# Patient Record
Sex: Female | Born: 1945 | Race: White | Hispanic: No | Marital: Married | Smoking: Former smoker
Health system: Southern US, Community
[De-identification: ages and names within clinical notes are randomized; demographics above are authoritative.]

## PROBLEM LIST (undated history)

## (undated) DIAGNOSIS — M76899 Other specified enthesopathies of unspecified lower limb, excluding foot: Secondary | ICD-10-CM

## (undated) DIAGNOSIS — E782 Mixed hyperlipidemia: Secondary | ICD-10-CM

## (undated) DIAGNOSIS — I10 Essential (primary) hypertension: Secondary | ICD-10-CM

## (undated) DIAGNOSIS — I059 Rheumatic mitral valve disease, unspecified: Secondary | ICD-10-CM

## (undated) DIAGNOSIS — M1990 Unspecified osteoarthritis, unspecified site: Secondary | ICD-10-CM

## (undated) DIAGNOSIS — M1610 Unilateral primary osteoarthritis, unspecified hip: Secondary | ICD-10-CM

## (undated) DIAGNOSIS — I4891 Unspecified atrial fibrillation: Secondary | ICD-10-CM

## (undated) HISTORY — DX: Unilateral primary osteoarthritis, unspecified hip: M16.10

## (undated) HISTORY — PX: DILATION AND CURETTAGE OF UTERUS: SHX78

## (undated) HISTORY — DX: Essential (primary) hypertension: I10

## (undated) HISTORY — DX: Mixed hyperlipidemia: E78.2

## (undated) HISTORY — DX: Unspecified atrial fibrillation: I48.91

## (undated) HISTORY — DX: Unspecified osteoarthritis, unspecified site: M19.90

## (undated) HISTORY — DX: Other specified enthesopathies of unspecified lower limb, excluding foot: M76.899

## (undated) HISTORY — PX: TONSILLECTOMY: SUR1361

## (undated) HISTORY — DX: Rheumatic mitral valve disease, unspecified: I05.9

---

## 1997-11-27 ENCOUNTER — Encounter: Admission: RE | Admit: 1997-11-27 | Discharge: 1997-11-27 | Payer: Self-pay | Admitting: Sports Medicine

## 1999-01-18 ENCOUNTER — Other Ambulatory Visit: Admission: RE | Admit: 1999-01-18 | Discharge: 1999-01-18 | Payer: Self-pay | Admitting: Obstetrics & Gynecology

## 2000-02-27 ENCOUNTER — Other Ambulatory Visit: Admission: RE | Admit: 2000-02-27 | Discharge: 2000-02-27 | Payer: Self-pay | Admitting: Obstetrics & Gynecology

## 2000-03-01 ENCOUNTER — Encounter: Payer: Self-pay | Admitting: Obstetrics & Gynecology

## 2000-03-01 ENCOUNTER — Encounter: Admission: RE | Admit: 2000-03-01 | Discharge: 2000-03-01 | Payer: Self-pay | Admitting: Obstetrics & Gynecology

## 2000-11-26 ENCOUNTER — Ambulatory Visit (HOSPITAL_BASED_OUTPATIENT_CLINIC_OR_DEPARTMENT_OTHER): Admission: RE | Admit: 2000-11-26 | Discharge: 2000-11-26 | Payer: Self-pay | Admitting: Plastic Surgery

## 2000-11-26 ENCOUNTER — Encounter (INDEPENDENT_AMBULATORY_CARE_PROVIDER_SITE_OTHER): Payer: Self-pay | Admitting: Specialist

## 2001-03-05 ENCOUNTER — Encounter: Payer: Self-pay | Admitting: Obstetrics & Gynecology

## 2001-03-05 ENCOUNTER — Encounter: Admission: RE | Admit: 2001-03-05 | Discharge: 2001-03-05 | Payer: Self-pay | Admitting: Obstetrics & Gynecology

## 2001-04-01 ENCOUNTER — Other Ambulatory Visit: Admission: RE | Admit: 2001-04-01 | Discharge: 2001-04-01 | Payer: Self-pay | Admitting: Obstetrics & Gynecology

## 2001-12-04 ENCOUNTER — Ambulatory Visit (HOSPITAL_COMMUNITY): Admission: RE | Admit: 2001-12-04 | Discharge: 2001-12-04 | Payer: Self-pay | Admitting: *Deleted

## 2002-03-18 ENCOUNTER — Encounter: Payer: Self-pay | Admitting: Obstetrics & Gynecology

## 2002-03-18 ENCOUNTER — Ambulatory Visit (HOSPITAL_COMMUNITY): Admission: RE | Admit: 2002-03-18 | Discharge: 2002-03-18 | Payer: Self-pay | Admitting: Obstetrics & Gynecology

## 2002-05-19 ENCOUNTER — Other Ambulatory Visit: Admission: RE | Admit: 2002-05-19 | Discharge: 2002-05-19 | Payer: Self-pay | Admitting: Obstetrics & Gynecology

## 2002-06-06 ENCOUNTER — Encounter: Admission: RE | Admit: 2002-06-06 | Discharge: 2002-06-06 | Payer: Self-pay | Admitting: Sports Medicine

## 2002-06-12 ENCOUNTER — Encounter: Admission: RE | Admit: 2002-06-12 | Discharge: 2002-06-12 | Payer: Self-pay | Admitting: Sports Medicine

## 2002-06-12 ENCOUNTER — Encounter: Payer: Self-pay | Admitting: Sports Medicine

## 2002-06-24 ENCOUNTER — Encounter: Admission: RE | Admit: 2002-06-24 | Discharge: 2002-06-24 | Payer: Self-pay | Admitting: Family Medicine

## 2002-07-22 ENCOUNTER — Encounter: Admission: RE | Admit: 2002-07-22 | Discharge: 2002-07-22 | Payer: Self-pay | Admitting: Sports Medicine

## 2003-05-13 ENCOUNTER — Ambulatory Visit (HOSPITAL_COMMUNITY): Admission: RE | Admit: 2003-05-13 | Discharge: 2003-05-13 | Payer: Self-pay | Admitting: Obstetrics & Gynecology

## 2003-05-13 ENCOUNTER — Encounter: Payer: Self-pay | Admitting: Obstetrics & Gynecology

## 2003-06-26 ENCOUNTER — Other Ambulatory Visit: Admission: RE | Admit: 2003-06-26 | Discharge: 2003-06-26 | Payer: Self-pay | Admitting: Obstetrics & Gynecology

## 2003-12-23 ENCOUNTER — Emergency Department (HOSPITAL_COMMUNITY): Admission: EM | Admit: 2003-12-23 | Discharge: 2003-12-23 | Payer: Self-pay | Admitting: *Deleted

## 2004-02-04 ENCOUNTER — Encounter: Admission: RE | Admit: 2004-02-04 | Discharge: 2004-05-04 | Payer: Self-pay | Admitting: Family Medicine

## 2004-05-13 ENCOUNTER — Ambulatory Visit (HOSPITAL_COMMUNITY): Admission: RE | Admit: 2004-05-13 | Discharge: 2004-05-13 | Payer: Self-pay | Admitting: Obstetrics & Gynecology

## 2004-07-07 ENCOUNTER — Other Ambulatory Visit: Admission: RE | Admit: 2004-07-07 | Discharge: 2004-07-07 | Payer: Self-pay | Admitting: Obstetrics & Gynecology

## 2005-05-16 ENCOUNTER — Ambulatory Visit (HOSPITAL_COMMUNITY): Admission: RE | Admit: 2005-05-16 | Discharge: 2005-05-16 | Payer: Self-pay | Admitting: Obstetrics & Gynecology

## 2005-08-29 ENCOUNTER — Other Ambulatory Visit: Admission: RE | Admit: 2005-08-29 | Discharge: 2005-08-29 | Payer: Self-pay | Admitting: Obstetrics & Gynecology

## 2006-05-22 ENCOUNTER — Ambulatory Visit (HOSPITAL_COMMUNITY): Admission: RE | Admit: 2006-05-22 | Discharge: 2006-05-22 | Payer: Self-pay | Admitting: Obstetrics & Gynecology

## 2007-02-12 ENCOUNTER — Ambulatory Visit: Payer: Self-pay | Admitting: Sports Medicine

## 2007-02-12 DIAGNOSIS — M533 Sacrococcygeal disorders, not elsewhere classified: Secondary | ICD-10-CM

## 2007-02-12 DIAGNOSIS — M7062 Trochanteric bursitis, left hip: Secondary | ICD-10-CM

## 2007-03-26 ENCOUNTER — Ambulatory Visit: Payer: Self-pay | Admitting: Sports Medicine

## 2007-06-27 ENCOUNTER — Ambulatory Visit (HOSPITAL_COMMUNITY): Admission: RE | Admit: 2007-06-27 | Discharge: 2007-06-27 | Payer: Self-pay | Admitting: Obstetrics & Gynecology

## 2007-11-19 ENCOUNTER — Encounter: Admission: RE | Admit: 2007-11-19 | Discharge: 2007-11-19 | Payer: Self-pay | Admitting: Family Medicine

## 2008-05-11 ENCOUNTER — Encounter: Payer: Self-pay | Admitting: Internal Medicine

## 2008-05-11 ENCOUNTER — Ambulatory Visit: Payer: Self-pay

## 2008-05-11 ENCOUNTER — Encounter (INDEPENDENT_AMBULATORY_CARE_PROVIDER_SITE_OTHER): Payer: Self-pay | Admitting: Family Medicine

## 2008-05-16 ENCOUNTER — Ambulatory Visit: Payer: Self-pay | Admitting: Internal Medicine

## 2008-05-16 ENCOUNTER — Inpatient Hospital Stay (HOSPITAL_COMMUNITY): Admission: EM | Admit: 2008-05-16 | Discharge: 2008-05-18 | Payer: Self-pay | Admitting: Emergency Medicine

## 2008-05-18 ENCOUNTER — Encounter: Payer: Self-pay | Admitting: Cardiology

## 2008-05-21 ENCOUNTER — Ambulatory Visit: Payer: Self-pay | Admitting: Internal Medicine

## 2008-05-21 LAB — CONVERTED CEMR LAB
INR: 2.6 — ABNORMAL HIGH (ref 0.8–1.0)
Prothrombin Time: 28.1 s — ABNORMAL HIGH (ref 10.9–13.3)

## 2008-05-25 ENCOUNTER — Ambulatory Visit: Payer: Self-pay | Admitting: Internal Medicine

## 2008-06-01 ENCOUNTER — Ambulatory Visit: Payer: Self-pay | Admitting: Internal Medicine

## 2008-06-08 ENCOUNTER — Ambulatory Visit: Payer: Self-pay | Admitting: Internal Medicine

## 2008-06-15 ENCOUNTER — Ambulatory Visit: Payer: Self-pay | Admitting: Cardiology

## 2008-06-29 ENCOUNTER — Ambulatory Visit (HOSPITAL_COMMUNITY): Admission: RE | Admit: 2008-06-29 | Discharge: 2008-06-29 | Payer: Self-pay | Admitting: Obstetrics & Gynecology

## 2008-06-30 ENCOUNTER — Encounter: Payer: Self-pay | Admitting: Internal Medicine

## 2008-06-30 ENCOUNTER — Ambulatory Visit: Payer: Self-pay | Admitting: Cardiology

## 2008-06-30 LAB — CONVERTED CEMR LAB
Calcium: 9 mg/dL (ref 8.4–10.5)
GFR calc Af Amer: 109 mL/min
GFR calc non Af Amer: 90 mL/min
Potassium: 3.8 meq/L (ref 3.5–5.1)
Sodium: 139 meq/L (ref 135–145)

## 2008-07-13 ENCOUNTER — Encounter: Admission: RE | Admit: 2008-07-13 | Discharge: 2008-07-13 | Payer: Self-pay | Admitting: Obstetrics & Gynecology

## 2008-07-27 ENCOUNTER — Ambulatory Visit: Payer: Self-pay | Admitting: Cardiology

## 2008-08-18 ENCOUNTER — Ambulatory Visit: Payer: Self-pay | Admitting: Cardiovascular Disease

## 2008-09-03 ENCOUNTER — Ambulatory Visit: Payer: Self-pay | Admitting: Cardiology

## 2008-09-24 ENCOUNTER — Ambulatory Visit: Payer: Self-pay | Admitting: Internal Medicine

## 2008-09-24 ENCOUNTER — Ambulatory Visit: Payer: Self-pay | Admitting: Cardiovascular Disease

## 2008-09-25 ENCOUNTER — Ambulatory Visit: Payer: Self-pay | Admitting: Internal Medicine

## 2008-09-25 LAB — CONVERTED CEMR LAB
ALT: 14 units/L (ref 0–35)
Albumin: 3.6 g/dL (ref 3.5–5.2)
BUN: 16 mg/dL (ref 6–23)
Bilirubin, Direct: 0.1 mg/dL (ref 0.0–0.3)
CO2: 28 meq/L (ref 19–32)
Calcium: 8.7 mg/dL (ref 8.4–10.5)
Cholesterol: 238 mg/dL (ref 0–200)
GFR calc Af Amer: 94 mL/min
Glucose, Bld: 90 mg/dL (ref 70–99)
HDL: 45.8 mg/dL (ref >39.0)
Total Protein: 6.2 g/dL (ref 6.0–8.3)
Triglycerides: 189 mg/dL — ABNORMAL HIGH (ref 0–149)

## 2008-10-15 ENCOUNTER — Ambulatory Visit: Payer: Self-pay | Admitting: Cardiovascular Disease

## 2008-11-05 ENCOUNTER — Ambulatory Visit: Payer: Self-pay | Admitting: Cardiovascular Disease

## 2008-11-16 ENCOUNTER — Telehealth: Payer: Self-pay | Admitting: Internal Medicine

## 2008-12-04 ENCOUNTER — Ambulatory Visit: Payer: Self-pay | Admitting: Cardiology

## 2008-12-16 ENCOUNTER — Telehealth: Payer: Self-pay | Admitting: Internal Medicine

## 2008-12-31 ENCOUNTER — Ambulatory Visit: Payer: Self-pay | Admitting: Internal Medicine

## 2008-12-31 LAB — CONVERTED CEMR LAB
POC INR: 2.3
Protime: 18.5

## 2009-01-05 ENCOUNTER — Encounter: Payer: Self-pay | Admitting: *Deleted

## 2009-02-01 ENCOUNTER — Ambulatory Visit: Payer: Self-pay | Admitting: Cardiology

## 2009-02-09 ENCOUNTER — Ambulatory Visit: Payer: Self-pay | Admitting: Cardiology

## 2009-02-09 LAB — CONVERTED CEMR LAB
POC INR: 3.4
Prothrombin Time: 22.2 s

## 2009-02-10 ENCOUNTER — Encounter: Payer: Self-pay | Admitting: *Deleted

## 2009-02-17 ENCOUNTER — Telehealth: Payer: Self-pay | Admitting: Internal Medicine

## 2009-02-17 ENCOUNTER — Ambulatory Visit: Payer: Self-pay | Admitting: Internal Medicine

## 2009-02-17 ENCOUNTER — Encounter: Payer: Self-pay | Admitting: Internal Medicine

## 2009-02-17 LAB — CONVERTED CEMR LAB: POC INR: 2.8

## 2009-03-03 ENCOUNTER — Ambulatory Visit: Payer: Self-pay | Admitting: Cardiology

## 2009-03-03 LAB — CONVERTED CEMR LAB
POC INR: 2.8
Prothrombin Time: 20.2 s

## 2009-03-05 ENCOUNTER — Telehealth: Payer: Self-pay | Admitting: Internal Medicine

## 2009-03-22 ENCOUNTER — Telehealth: Payer: Self-pay | Admitting: Internal Medicine

## 2009-03-23 ENCOUNTER — Ambulatory Visit: Payer: Self-pay | Admitting: Internal Medicine

## 2009-03-24 DIAGNOSIS — I10 Essential (primary) hypertension: Secondary | ICD-10-CM | POA: Insufficient documentation

## 2009-03-24 DIAGNOSIS — Z8679 Personal history of other diseases of the circulatory system: Secondary | ICD-10-CM | POA: Insufficient documentation

## 2009-03-24 DIAGNOSIS — I4891 Unspecified atrial fibrillation: Secondary | ICD-10-CM | POA: Insufficient documentation

## 2009-03-24 DIAGNOSIS — I059 Rheumatic mitral valve disease, unspecified: Secondary | ICD-10-CM | POA: Insufficient documentation

## 2009-03-24 LAB — CONVERTED CEMR LAB
ALT: 12 units/L (ref 0–35)
AST: 16 units/L (ref 0–37)
Alkaline Phosphatase: 60 units/L (ref 39–117)
HDL: 50.1 mg/dL (ref >39.00)
Total Bilirubin: 0.6 mg/dL (ref 0.3–1.2)
Total CHOL/HDL Ratio: 3
Triglycerides: 108 mg/dL (ref 0.0–149.0)

## 2009-03-25 ENCOUNTER — Ambulatory Visit: Payer: Self-pay | Admitting: Cardiology

## 2009-03-25 ENCOUNTER — Ambulatory Visit: Payer: Self-pay | Admitting: Internal Medicine

## 2009-03-25 DIAGNOSIS — E782 Mixed hyperlipidemia: Secondary | ICD-10-CM | POA: Insufficient documentation

## 2009-03-29 ENCOUNTER — Telehealth: Payer: Self-pay | Admitting: Internal Medicine

## 2009-04-15 ENCOUNTER — Ambulatory Visit: Payer: Self-pay | Admitting: Internal Medicine

## 2009-04-15 DIAGNOSIS — M1610 Unilateral primary osteoarthritis, unspecified hip: Secondary | ICD-10-CM | POA: Insufficient documentation

## 2009-04-22 ENCOUNTER — Ambulatory Visit: Payer: Self-pay | Admitting: Cardiology

## 2009-04-29 ENCOUNTER — Ambulatory Visit: Payer: Self-pay | Admitting: Internal Medicine

## 2009-04-29 LAB — CONVERTED CEMR LAB
BUN: 15 mg/dL (ref 6–23)
CO2: 28 meq/L (ref 19–32)
Glucose, Bld: 87 mg/dL (ref 70–99)
Potassium: 3.8 meq/L (ref 3.5–5.1)
Sodium: 139 meq/L (ref 135–145)

## 2009-05-04 ENCOUNTER — Ambulatory Visit: Payer: Self-pay | Admitting: Internal Medicine

## 2009-05-05 ENCOUNTER — Encounter: Payer: Self-pay | Admitting: Internal Medicine

## 2009-05-19 ENCOUNTER — Encounter: Payer: Self-pay | Admitting: Internal Medicine

## 2009-05-20 ENCOUNTER — Ambulatory Visit: Payer: Self-pay | Admitting: Cardiovascular Disease

## 2009-06-02 ENCOUNTER — Telehealth: Payer: Self-pay | Admitting: Internal Medicine

## 2009-06-17 ENCOUNTER — Ambulatory Visit: Payer: Self-pay

## 2009-07-08 ENCOUNTER — Ambulatory Visit (HOSPITAL_COMMUNITY): Admission: RE | Admit: 2009-07-08 | Discharge: 2009-07-08 | Payer: Self-pay | Admitting: Obstetrics & Gynecology

## 2009-07-15 ENCOUNTER — Ambulatory Visit: Payer: Self-pay | Admitting: Cardiology

## 2009-07-15 LAB — CONVERTED CEMR LAB: POC INR: 2

## 2009-08-12 ENCOUNTER — Ambulatory Visit: Payer: Self-pay | Admitting: Internal Medicine

## 2009-08-12 LAB — CONVERTED CEMR LAB: POC INR: 2.7

## 2009-08-23 ENCOUNTER — Telehealth: Payer: Self-pay | Admitting: Internal Medicine

## 2009-09-09 ENCOUNTER — Ambulatory Visit: Payer: Self-pay | Admitting: Internal Medicine

## 2009-09-09 LAB — CONVERTED CEMR LAB: POC INR: 2.7

## 2009-10-07 ENCOUNTER — Ambulatory Visit: Payer: Self-pay | Admitting: Internal Medicine

## 2009-10-07 LAB — CONVERTED CEMR LAB: POC INR: 2.4

## 2009-10-22 ENCOUNTER — Telehealth: Payer: Self-pay | Admitting: Internal Medicine

## 2009-10-26 ENCOUNTER — Encounter: Payer: Self-pay | Admitting: Internal Medicine

## 2009-11-03 ENCOUNTER — Ambulatory Visit: Payer: Self-pay | Admitting: Cardiovascular Disease

## 2009-11-03 LAB — CONVERTED CEMR LAB: POC INR: 2.6

## 2009-11-18 ENCOUNTER — Ambulatory Visit: Payer: Self-pay | Admitting: Internal Medicine

## 2009-11-22 ENCOUNTER — Ambulatory Visit: Payer: Self-pay | Admitting: Internal Medicine

## 2009-11-22 LAB — CONVERTED CEMR LAB
ALT: 14 units/L (ref 0–35)
AST: 18 units/L (ref 0–37)
Albumin: 3.6 g/dL (ref 3.5–5.2)
Alkaline Phosphatase: 58 units/L (ref 39–117)
Calcium: 8.7 mg/dL (ref 8.4–10.5)
Cholesterol: 134 mg/dL (ref 0–200)
Creatinine, Ser: 0.8 mg/dL (ref 0.4–1.2)
GFR calc non Af Amer: 77.07 mL/min (ref >60)
Sodium: 141 meq/L (ref 135–145)
Total CHOL/HDL Ratio: 3
Triglycerides: 82 mg/dL (ref 0.0–149.0)

## 2009-11-23 ENCOUNTER — Telehealth: Payer: Self-pay | Admitting: Internal Medicine

## 2009-12-09 ENCOUNTER — Ambulatory Visit: Payer: Self-pay | Admitting: Cardiology

## 2009-12-30 ENCOUNTER — Encounter: Payer: Self-pay | Admitting: Internal Medicine

## 2010-01-04 ENCOUNTER — Ambulatory Visit: Payer: Self-pay | Admitting: Cardiology

## 2010-01-04 LAB — CONVERTED CEMR LAB: POC INR: 1.8

## 2010-01-18 ENCOUNTER — Telehealth: Payer: Self-pay | Admitting: Internal Medicine

## 2010-01-25 ENCOUNTER — Encounter: Payer: Self-pay | Admitting: Cardiology

## 2010-01-25 ENCOUNTER — Ambulatory Visit: Payer: Self-pay

## 2010-02-03 ENCOUNTER — Ambulatory Visit: Payer: Self-pay | Admitting: Internal Medicine

## 2010-02-04 LAB — CONVERTED CEMR LAB
Basophils Relative: 0.5 % (ref 0.0–3.0)
Eosinophils Absolute: 0.1 10*3/uL (ref 0.0–0.7)
HCT: 38.9 % (ref 36.0–46.0)
Hemoglobin: 13.4 g/dL (ref 12.0–15.0)
Lymphocytes Relative: 28.2 % (ref 12.0–46.0)
MCHC: 34.5 g/dL (ref 30.0–36.0)
Monocytes Relative: 7 % (ref 3.0–12.0)
Neutro Abs: 5.3 10*3/uL (ref 1.4–7.7)
RBC: 4.14 M/uL (ref 3.87–5.11)

## 2010-02-24 ENCOUNTER — Telehealth: Payer: Self-pay | Admitting: Internal Medicine

## 2010-06-22 ENCOUNTER — Telehealth: Payer: Self-pay | Admitting: Internal Medicine

## 2010-07-12 ENCOUNTER — Ambulatory Visit (HOSPITAL_COMMUNITY)
Admission: RE | Admit: 2010-07-12 | Discharge: 2010-07-12 | Payer: Self-pay | Source: Home / Self Care | Admitting: Obstetrics & Gynecology

## 2010-07-15 ENCOUNTER — Ambulatory Visit: Payer: Self-pay | Admitting: Internal Medicine

## 2010-07-15 ENCOUNTER — Encounter: Payer: Self-pay | Admitting: Internal Medicine

## 2010-08-12 ENCOUNTER — Encounter
Admission: RE | Admit: 2010-08-12 | Discharge: 2010-08-12 | Payer: Self-pay | Source: Home / Self Care | Attending: Orthopedic Surgery | Admitting: Orthopedic Surgery

## 2010-08-28 ENCOUNTER — Encounter: Payer: Self-pay | Admitting: Obstetrics & Gynecology

## 2010-09-06 NOTE — Medication Information (Signed)
Summary: rov-tp  Anticoagulant Therapy  Managed by: Weston Brass, PharmD Referring MD: Hillis Range PCP: Newt Lukes MD Supervising MD: Shirlee Latch MD, [place] Maulden Indication 1: Atrial Fibrillation (ICD-427.31) Lab Used: [facility] Site: [facility] INR POC 1.8 INR RANGE 2 - 3  Dietary changes: no    Health status changes: no    Bleeding/hemorrhagic complications: no    Recent/future hospitalizations: no    Any changes in medication regimen? yes       Details: on amoxicillin; has about 4 days left   Recent/future dental: no  Any missed doses?: no       Is patient compliant with meds? yes       Allergies: No Known Drug Allergies  Anticoagulation Management History:      The patient is taking warfarin and comes in today for a routine follow up visit.  Negative risk factors for bleeding include an age less than 64 years old.  The bleeding index is 'low risk'.  Positive CHADS2 values include History of HTN.  Negative CHADS2 values include Age > 64 years old.  The start date was 05/16/2008.  Her last INR was 1.7 ratio.  Anticoagulation responsible provider: Shirlee Latch MD, Takerra Lupinacci.  INR POC: 1.8.  Cuvette Lot#: [id number].  Exp: 03/2011.    Anticoagulation Management Assessment/Plan:      The target INR is 2 - 3.  The next INR is due 01/25/2010.  Anticoagulation instructions were given to patient.  Results were reviewed/authorized by Weston Brass, PharmD.  She was notified by Weston Brass PharmD.         Prior Anticoagulation Instructions: Take 2 tablets today then resume 1 tab daily except 2 tabs on MWF.   Current Anticoagulation Instructions: INR 1.8  Increase dose to 2 tablets every day except 1 tablet on Monday, Wednesday and Friday

## 2010-09-06 NOTE — Progress Notes (Signed)
Summary: refill request  Phone Note Call from Patient Call back at Home Phone [phone]   Caller: Patient Summary of Call: pt called requesting refill of [facility]ril [facility] [address] Initial call taken by: Margaret Pyle, CMA,  August 23, 2009 3:06 PM    Prescriptions: LISINOPRIL 40 MG TABS (LISINOPRIL) Take 1 tablet once a day  #30 x 2   Entered by:   Margaret Pyle, CMA   Authorized by:   Corwin Levins MD   Signed by:   Margaret Pyle, CMA on 08/23/2009   Method used:   [facility]ron[facility] to        [facility]  [phone]* (retail)       [address] [place], [place]  [address]       Ph: [phone] or [phone]       Fax: [phone]   RxID:   [id number]

## 2010-09-06 NOTE — Progress Notes (Signed)
Summary: lisinopril  Phone Note Refill Request Message from:  Fax from Pharmacy on June 22, 2010 11:39 AM  Refills Requested: Medication #1:  LISINOPRIL 40 MG TABS Take 1 tablet once a day [facility]  Initial call taken by: Orlan Leavens RMA,  June 22, 2010 11:39 AM    Prescriptions: LISINOPRIL 40 MG TABS (LISINOPRIL) Take 1 tablet once a day  #90 x 2   Entered by:   Orlan Leavens RMA   Authorized by:   Newt Lukes MD   Signed by:   Orlan Leavens RMA on 06/22/2010   Method used:   Faxed to ...       [facility] [place] (retail)             , [place]         Ph: [phone]       Fax: [phone]   RxID:   [id number]

## 2010-09-06 NOTE — Letter (Signed)
Summary: [facility]  [facility]   Imported By: Sherian Rein 02/02/2010 14:52:10  _____________________________________________________________________  External Attachment:    Type:   Image     Comment:   External Document

## 2010-09-06 NOTE — Progress Notes (Signed)
Summary: HCTZ  Phone Note Refill Request Message from:  Fax from Pharmacy on February 24, 2010 9:01 AM  Refills Requested: Medication #1:  HYDROCHLOROTHIAZIDE 25 MG TABS 1 tab by mouth once daily  or as directed [facility]  Initial call taken by: Orlan Leavens,  February 24, 2010 9:01 AM  Follow-up for Phone Call        MD sign paper request ok # [id number] with 3 refills. [facility]. will update EMR Follow-up by: Orlan Leavens,  February 24, 2010 9:02 AM    Prescriptions: HYDROCHLOROTHIAZIDE 25 MG TABS (HYDROCHLOROTHIAZIDE) 1 tab by mouth once daily  or as directed  #90 x 3   Entered by:   Orlan Leavens   Authorized by:   Newt Lukes MD   Signed by:   Orlan Leavens on 02/24/2010   Method used:   Faxed to ...       [facility] (retail)             , [place]         Ph: [phone]       Fax: [phone]   RxID:   [id number]

## 2010-09-06 NOTE — Medication Information (Signed)
Summary: [facility]  Anticoagulant Therapy  Managed by: Inactive Referring MD: Hillis Range PCP: Newt Lukes MD Supervising MD: Riley Kill MD, Maisie Fus Indication 1: Atrial Fibrillation (ICD-427.31) Lab Used: [facility] Site: [facility] INR RANGE 2 - 3          Comments: Starting Pradaxa today.   Allergies: No Known Drug Allergies  Anticoagulation Management History:      Negative risk factors for bleeding include an age less than 64 years old.  The bleeding index is 'low risk'.  Positive CHADS2 values include History of HTN.  Negative CHADS2 values include Age > 61 years old.  The start date was 05/16/2008.  Her last INR was 1.7 ratio.  Anticoagulation responsible provider: Riley Kill MD, Maisie Fus.  Exp: 04/2011.    Anticoagulation Management Assessment/Plan:      The patient's current anticoagulation dose is Warfarin sodium 2.5 mg tabs: Use as directed by Anticoagualtion Clinic.  The target INR is 2 - 3.  The next INR is due 01/25/2010.  Anticoagulation instructions were given to patient.  Results were reviewed/authorized by Inactive.         Prior Anticoagulation Instructions: INR 1.4 Discontinue coumadin and start Pradaxa 150mg s two times a day starting today.  Lab work on 02/03/10 at 1030AM.

## 2010-09-06 NOTE — Letter (Signed)
Summary: [facility]  [facility]   Imported By: Sherian Rein 11/08/2009 13:49:26  _____________________________________________________________________  External Attachment:    Type:   Image     Comment:   External Document

## 2010-09-06 NOTE — Medication Information (Signed)
Summary: rov/sp  Anticoagulant Therapy  Managed by: Bethena Midget, RN, BSN Referring MD: Hillis Range PCP: Newt Lukes MD Supervising MD: Riley Kill MD, Maisie Fus Indication 1: Atrial Fibrillation (ICD-427.31) Lab Used: [facility] Site: [facility] INR POC 1.4 INR RANGE 2 - 3  Dietary changes: no    Health status changes: no    Bleeding/hemorrhagic complications: no    Recent/future hospitalizations: no    Any changes in medication regimen? no    Recent/future dental: no  Any missed doses?: yes     Details: Held coumadin since Saturday's dose, pending Pradaxa start  Is patient compliant with meds? yes      s  Allergies: No Known Drug Allergies  Anticoagulation Management History:      The patient is taking warfarin and comes in today for a routine follow up visit.  Negative risk factors for bleeding include an age less than 89 years old.  The bleeding index is 'low risk'.  Positive CHADS2 values include History of HTN.  Negative CHADS2 values include Age > 64 years old.  The start date was 05/16/2008.  Her last INR was 1.7 ratio.  Anticoagulation responsible provider: Riley Kill MD, Maisie Fus.  INR POC: 1.4.  Cuvette Lot#: [id number].  Exp: 04/2011.    Anticoagulation Management Assessment/Plan:      The patient's current anticoagulation dose is Warfarin sodium 2.5 mg tabs: Use as directed by Anticoagualtion Clinic.  The target INR is 2 - 3.  The next INR is due 01/25/2010.  Anticoagulation instructions were given to patient.  Results were reviewed/authorized by Bethena Midget, RN, BSN.  She was notified by Bethena Midget, RN, BSN.         Prior Anticoagulation Instructions: INR 1.8  Increase dose to 2 tablets every day except 1 tablet on Monday, Wednesday and Friday   Current Anticoagulation Instructions: INR 1.4 Discontinue coumadin and start Pradaxa 150mg s two times a day starting today.  Lab work on 02/03/10 at 1030AM.

## 2010-09-06 NOTE — Assessment & Plan Note (Signed)
Summary: f44m/per check out/lg   Primary Provider:  Newt Lukes MD   History of Present Illness: The patient presents today for routine electrophysiology followup. She reports doing very well since last being seen in our clinic.  She reports mild palpitations and fatigue with dizziness lasting 1 hour earlier this week. The patient denies symptoms of chest pain, shortness of breath, orthopnea, PND, lower extremity edema,  presyncope, syncope, or neurologic sequela. The patient is tolerating medications without difficulties and is otherwise without complaint today.   Current Medications (verified): 1)  Lisinopril 40 Mg Tabs (Lisinopril) .... Take 1 Tablet Once A Day 2)  Simvastatin 20 Mg Tabs (Simvastatin) .... Take 1 Tablet By Mouth At Bedtime 3)  Cartia Xt 240 Mg Xr24h-Cap (Diltiazem Hcl Coated Beads) .... Take One Tablet Once Daily 4)  Diltiazem Hcl 30 Mg Tabs (Diltiazem Hcl) .... As Needed 5)  Hydrochlorothiazide 25 Mg Tabs (Hydrochlorothiazide) .[name].. 1 Tab By Mouth Once Daily  or As Directed 6)  Pradaxa 150 Mg Caps (Dabigatran Etexilate Mesylate) .... Two Times A Day  Allergies (verified): No Known Drug Allergies  Past History:  Past Medical History: Reviewed history from 05/04/2009 and no changes required. MITRAL VALVE PROLAPSE  HYPERTENSION ATRIAL FIBRILLATION  dyslipidemia Last DEXA scan 2008, no osteoporosis found  physician roster: Kania Regnier-cardiology. Lloyd Huger - gynecology  Past Surgical History: Reviewed history from 04/15/2009 and no changes required.  DC CARDIOVERSION  05/18/2008  Marca Ancona, MD   Excision of previous vertically-oriented biopsy site of dysplastic nevus, left lower forehead Tonsillectomy (1952)  Vital Signs:  Patient profile:   65 year old female Height:      64 inches Weight:      179 pounds BMI:     30.84 Pulse rate:   70 / minute BP sitting:   120 / 70  (left arm)  Vitals Entered By: Laurance Flatten CMA (July 15, 2010 11:30  AM)  Physical Exam  General:  Well developed, well nourished, in no acute distress. Head:  normocephalic and atraumatic Eyes:  PERRLA/EOM intact; conjunctiva and lids normal. Mouth:  Teeth, gums and palate normal. Oral mucosa normal. Neck:  supple Lungs:  Clear bilaterally to auscultation and percussion. Heart:  Non-displaced PMI, chest non-tender; regular rate and rhythm, S1, S2 without murmurs, rubs or gallops. Carotid upstroke normal, no bruit. Normal abdominal aortic size, no bruits. Femorals normal pulses, no bruits. Pedals normal pulses. No edema, no varicosities. Abdomen:  Bowel sounds positive; abdomen soft and non-tender without masses, organomegaly, or hernias noted. No hepatosplenomegaly. Msk:  Back normal, normal gait. Muscle strength and tone normal. Pulses:  pulses normal in all 4 extremities Extremities:  No clubbing or cyanosis. Neurologic:  Alert and oriented x 3.   EKG  Procedure date:  07/15/2010  Findings:      sinus 70 bpm, normal ekg  Impression & Recommendations:  Problem # 1:  ATRIAL FIBRILLATION (ICD-427.31) Appears to be doing well. She should call our office if she has sustained palpitations to have an ekg obtained.  We will continue cardizem. continue pradaxa  Problem # 2:  MIXED HYPERLIPIDEMIA (ICD-272.2) controlled Her updated medication list for this problem includes:    Simvastatin 20 Mg Tabs (Simvastatin) .[name]... Take 1 tablet by mouth at bedtime  Problem # 3:  HYPERTENSION (ICD-401.9) controlled  Patient Instructions: 1)  Your physician wants you to follow-up in: 12 months with Dr Jacquiline Doe will receive a reminder letter in the mail two months in advance. If you  don't receive a letter, please call our office to schedule the follow-up appointment. 2)  Your physician recommends that you continue on your current medications as directed. Please refer to the Current Medication list given to you today.

## 2010-09-06 NOTE — Progress Notes (Signed)
Summary: lisinopril  Phone Note Call from Patient Call back at [facility] Phone [phone]   Summary of Call: Patient left message on triage that she would like someone to call her to discuss her Lisinopril. Patient is changing to [facility] and needs a 90 day supply prescription sent to them. Initial call taken by: Lucious Groves,  October 22, 2009 12:06 PM  Follow-up for Phone Call        RX sent, pt informed Follow-up by: Margaret Pyle, CMA,  October 22, 2009 1:41 PM    Prescriptions: LISINOPRIL 40 MG TABS (LISINOPRIL) Take 1 tablet once a day  #90 x 2   Entered by:   Margaret Pyle, CMA   Authorized by:   Newt Lukes MD   Signed by:   Margaret Pyle, CMA on 10/22/2009   Method used:   Faxed to ...       [facility] ([facility]order)       [address] [place], [place]  [address]       Ph: [phone]       Fax: [phone]   RxID:   [id number]

## 2010-09-06 NOTE — Medication Information (Signed)
Summary: ccr/ gd  Anticoagulant Therapy  Managed by: Charolotte Eke, PharmD Referring MD: Hillis Range PCP: Newt Lukes MD Supervising MD: Shirlee Latch MD, Ralston Venus Indication 1: Atrial Fibrillation (ICD-427.31) Lab Used: [facility] Site: [facility] INR POC 1.7 INR RANGE 2 - 3  Dietary changes: yes       Details: more salads lately  Health status changes: no    Bleeding/hemorrhagic complications: no    Recent/future hospitalizations: no    Any changes in medication regimen? no    Recent/future dental: no  Any missed doses?: yes     Details: x 1 approx. 5 days ago  Is patient compliant with meds? yes      Comments: Has plans to start Pradaxa in about 3 months.  Current Medications (verified): 1)  Lisinopril 40 Mg Tabs (Lisinopril) .... Take 1 Tablet Once A Day 2)  Simvastatin 20 Mg Tabs (Simvastatin) .... Take 1 Tablet By Mouth At Bedtime 3)  Cartia Xt 240 Mg Xr24h-Cap (Diltiazem Hcl Coated Beads) .... Take One Tablet Once Daily 4)  Activella 0.5-0.1 Mg Tabs (Estradiol-Norethindrone Acet) .... Once Daily 5)  Diltiazem Hcl 30 Mg Tabs (Diltiazem Hcl) .... As Needed 6)  Hydrochlorothiazide 25 Mg Tabs (Hydrochlorothiazide) .[name].. 1 Tab By Mouth Once Daily  or As Directed 7)  Pradaxa 150 Mg Caps (Dabigatran Etexilate Mesylate) .... One By Mouth Two Times A Day  Allergies (verified): No Known Drug Allergies  Anticoagulation Management History:      The patient is taking warfarin and comes in today for a routine follow up visit.  Negative risk factors for bleeding include an age less than 60 years old.  The bleeding index is 'low risk'.  Positive CHADS2 values include History of HTN.  Negative CHADS2 values include Age > 52 years old.  The start date was 05/16/2008.  Her last INR was 1.7 ratio.  Anticoagulation responsible provider: Shirlee Latch MD, Gildardo Tickner.  INR POC: 1.7.  Cuvette Lot#: [id number].  Exp: 01/06/2011.    Anticoagulation Management Assessment/Plan:      The target INR is  2 - 3.  The next INR is due 12/30/2009.  Anticoagulation instructions were given to patient.  Results were reviewed/authorized by Charolotte Eke, PharmD.  She was notified by Charolotte Eke, PharmD.         Prior Anticoagulation Instructions: INR 2.6  Continue on same dosage 1 tablet daily except 2 tablets on Mondays, Wednesdays, and Fridays.  Return in 4 weeks.    Current Anticoagulation Instructions: Take 2 tablets today then resume 1 tab daily except 2 tabs on MWF.

## 2010-09-06 NOTE — Medication Information (Signed)
Summary: rov/eac  Anticoagulant Therapy  Managed by: Cloyde Reams, RN, BSN Referring MD: Hillis Range PCP: Newt Lukes MD Supervising MD: Eden Emms MD, Theron Arista Indication 1: Atrial Fibrillation (ICD-427.31) Lab Used: [facility] Site: [facility] INR POC 2.6 INR RANGE 2 - 3  Dietary changes: yes       Details: Incr amt of vegetables/salads  Health status changes: no    Bleeding/hemorrhagic complications: no    Recent/future hospitalizations: no    Any changes in medication regimen? no    Recent/future dental: no  Any missed doses?: no       Is patient compliant with meds? yes       Allergies (verified): No Known Drug Allergies  Anticoagulation Management History:      The patient is taking warfarin and comes in today for a routine follow up visit.  Negative risk factors for bleeding include an age less than 85 years old.  The bleeding index is 'low risk'.  Positive CHADS2 values include History of HTN.  Negative CHADS2 values include Age > 31 years old.  The start date was 05/16/2008.  Her last INR was 2.6 RATIO.  Anticoagulation responsible provider: Eden Emms MD, Theron Arista.  INR POC: 2.6.  Cuvette Lot#: [id number].  Exp: 12/2010.    Anticoagulation Management Assessment/Plan:      The patient's current anticoagulation dose is Coumadin 2.5 mg tabs: use as directed per anticoagulation clinic..  The target INR is 2 - 3.  The next INR is due 12/02/2009.  Anticoagulation instructions were given to patient.  Results were reviewed/authorized by Cloyde Reams, RN, BSN.  She was notified by Cloyde Reams RN.         Prior Anticoagulation Instructions: INR 2.4  Continue current dosing schedule.  Take 2 tablets on Monday, Wednesday, and Friday and take 1 tablet all other days.  Return to clinic in 4 weeks.   Current Anticoagulation Instructions: INR 2.6  Continue on same dosage 1 tablet daily except 2 tablets on Mondays, Wednesdays, and Fridays.  Return in 4 weeks.

## 2010-09-06 NOTE — Medication Information (Signed)
Summary: rov/tm  Anticoagulant Therapy  Managed by: Cloyde Reams, RN, BSN Referring MD: Hillis Range PCP: Newt Lukes MD Supervising MD: Tenny Craw MD, Gunnar Fusi Indication 1: Atrial Fibrillation (ICD-427.31) Lab Used: [facility] Site: [facility] INR POC 2.7 INR RANGE 2 - 3  Dietary changes: no    Health status changes: no    Bleeding/hemorrhagic complications: no    Recent/future hospitalizations: no    Any changes in medication regimen? no    Recent/future dental: no  Any missed doses?: yes     Details: Unsure, may have missed 1 dose several weeks ago.  Is patient compliant with meds? no       Allergies (verified): No Known Drug Allergies  Anticoagulation Management History:      The patient is taking warfarin and comes in today for a routine follow up visit.  Negative risk factors for bleeding include an age less than 22 years old.  The bleeding index is 'low risk'.  Positive CHADS2 values include History of HTN.  Negative CHADS2 values include Age > 44 years old.  The start date was 05/16/2008.  Her last INR was 2.6 RATIO.  Anticoagulation responsible provider: Tenny Craw MD, Gunnar Fusi.  INR POC: 2.7.  Cuvette Lot#: [id number].  Exp: 09/2010.    Anticoagulation Management Assessment/Plan:      The patient's current anticoagulation dose is Coumadin 2.5 mg tabs: use as directed per anticoagulation clinic..  The target INR is 2 - 3.  The next INR is due 09/09/2009.  Anticoagulation instructions were given to patient.  Results were reviewed/authorized by Cloyde Reams, RN, BSN.  She was notified by Cloyde Reams RN.         Prior Anticoagulation Instructions: INR 2.0 Today take 2 tablets then resume 1 tab everyday except 2 tabs on Mondays, Wednesdays and Fridays. Recheck in 4 weeks.    Current Anticoagulation Instructions: INR 2.7  Continue on same dosage 1 tablet daily except 2 tablets on Mondays, Wednesdays, and Fridays.   Recheck in 4 weeks.

## 2010-09-06 NOTE — Medication Information (Signed)
Summary: rov/tm  Anticoagulant Therapy  Managed by: Eda Keys, PharmD Referring MD: Hillis Range PCP: Newt Lukes MD Supervising MD: Tenny Craw MD, Gunnar Fusi Indication 1: Atrial Fibrillation (ICD-427.31) Lab Used: [facility] Site: [facility] INR POC 2.4 INR RANGE 2 - 3  Dietary changes: no    Health status changes: no    Bleeding/hemorrhagic complications: no    Recent/future hospitalizations: no    Any changes in medication regimen? no    Recent/future dental: no  Any missed doses?: no       Is patient compliant with meds? yes       Allergies: No Known Drug Allergies  Anticoagulation Management History:      The patient is taking warfarin and comes in today for a routine follow up visit.  Negative risk factors for bleeding include an age less than 79 years old.  The bleeding index is 'low risk'.  Positive CHADS2 values include History of HTN.  Negative CHADS2 values include Age > 6 years old.  The start date was 05/16/2008.  Her last INR was 2.6 RATIO.  Anticoagulation responsible provider: Tenny Craw MD, Gunnar Fusi.  INR POC: 2.4.  Cuvette Lot#: [id number].  Exp: 12/2010.    Anticoagulation Management Assessment/Plan:      The patient's current anticoagulation dose is Coumadin 2.5 mg tabs: use as directed per anticoagulation clinic..  The target INR is 2 - 3.  The next INR is due 11/04/2009.  Anticoagulation instructions were given to patient.  Results were reviewed/authorized by Eda Keys, PharmD.  She was notified by Eda Keys.         Prior Anticoagulation Instructions: INR 2.7 Continue 2.5mg s daily except 5mg s on Mondays, Wednesdays and Fridays. Recheck in 4 weeks.   Current Anticoagulation Instructions: INR 2.4  Continue current dosing schedule.  Take 2 tablets on Monday, Wednesday, and Friday and take 1 tablet all other days.  Return to clinic in 4 weeks.

## 2010-09-06 NOTE — Assessment & Plan Note (Signed)
Summary: rov per pt call/lg   Primary Provider:  Newt Lukes MD  CC:  afib.  History of Present Illness: The patient presents today for routine electrophysiology followup. She reports doing very well since last being seen in our clinic.  She reports mild palpitations and fatigue with dizziness lasting 1 hour earlier this week. The patient denies symptoms of chest pain, shortness of breath, orthopnea, PND, lower extremity edema,  presyncope, syncope, or neurologic sequela. The patient is tolerating medications without difficulties and is otherwise without complaint today.   Current Medications (verified): 1)  Lisinopril 40 Mg Tabs (Lisinopril) .... Take 1 Tablet Once A Day 2)  Simvastatin 20 Mg Tabs (Simvastatin) .... Take 1 Tablet By Mouth At Bedtime 3)  Coumadin 2.5 Mg Tabs (Warfarin Sodium) .... Use As Directed Per Anticoagulation Clinic. 4)  Cartia Xt 240 Mg Xr24h-Cap (Diltiazem Hcl Coated Beads) .... Take One Tablet Once Daily 5)  Activella 0.5-0.1 Mg Tabs (Estradiol-Norethindrone Acet) .... Once Daily 6)  Diltiazem Hcl 30 Mg Tabs (Diltiazem Hcl) .... As Needed 7)  Hydrochlorothiazide 25 Mg Tabs (Hydrochlorothiazide) .[name].. 1 Tab By Mouth Once Daily  or As Directed  Allergies (verified): No Known Drug Allergies  Past History:  Past Medical History: Reviewed history from 05/04/2009 and no changes required. MITRAL VALVE PROLAPSE  HYPERTENSION ATRIAL FIBRILLATION  dyslipidemia Last DEXA scan 2008, no osteoporosis found  physician roster: Nycholas Rayner-cardiology. Lloyd Huger - gynecology  Past Surgical History: Reviewed history from 04/15/2009 and no changes required.  DC CARDIOVERSION  05/18/2008  Marca Ancona, MD   Excision of previous vertically-oriented biopsy site of dysplastic nevus, left lower forehead Tonsillectomy (1952)  Social History: Reviewed history from 04/15/2009 and no changes required. Patient lives in [place]. Retired Lives at home with husband, has one  son in college. Married Former Smoker (x one year -remote)  Review of Systems       All systems are reviewed and negative except as listed in the HPI.   Vital Signs:  Patient profile:   65 year old female Height:      64 inches Weight:      170 pounds BMI:     29.29 Pulse rate:   74 / minute Pulse rhythm:   regular BP sitting:   119 / 64  (left arm) Cuff size:   large  Vitals Entered By: Judithe Modest CMA (November 18, 2009 4:28 PM)  Physical Exam  General:  Well developed, well nourished, in no acute distress. Head:  normocephalic and atraumatic Eyes:  PERRLA/EOM intact; conjunctiva and lids normal. Mouth:  Teeth, gums and palate normal. Oral mucosa normal. Neck:  Neck supple, no JVD. No masses, thyromegaly or abnormal cervical nodes. Lungs:  Clear bilaterally to auscultation and percussion. Heart:  Non-displaced PMI, chest non-tender; regular rate and rhythm, S1, S2 without murmurs, rubs or gallops. Carotid upstroke normal, no bruit. Normal abdominal aortic size, no bruits. Femorals normal pulses, no bruits. Pedals normal pulses. No edema, no varicosities. Abdomen:  Bowel sounds positive; abdomen soft and non-tender without masses, organomegaly, or hernias noted. No hepatosplenomegaly. Msk:  Back normal, normal gait. Muscle strength and tone normal. Pulses:  pulses normal in all 4 extremities Extremities:  No clubbing or cyanosis. Neurologic:  Alert and oriented x 3. Skin:  Intact without lesions or rashes. Psych:  Normal affect.   EKG  Procedure date:  11/18/2009  Findings:      sinus rhythm 74 bpm, PR 190, QT 400, otherwies normal ekg  Impression & Recommendations:  Problem # 1:  ATRIAL FIBRILLATION (ICD-427.31)  Appears to be doing well. She should call our office if she has sustained palpitations to have an ekg obtained.  We will continue cardizem. WE had a long discussion today regarding pradaxa as an alternative to coumadin.  She wishes to switch to pradaxa.   She will therefore stop coumadin today and have an INR on Monday.  If her INR<2, she will start pradaxa 150mg  two times a day at that time.  Problem # 2:  HYPERTENSION (ICD-401.9)  much improved we will obtain a bmet  weight loss advised  Problem # 3:  MIXED HYPERLIPIDEMIA (ICD-272.2) we will check fasting lipids and LFTs on Monday  Her updated medication list for this problem includes:    Simvastatin 20 Mg Tabs (Simvastatin) .[name]... Take 1 tablet by mouth at bedtime  Patient Instructions: 1)  Your physician recommends that you schedule a follow-up appointment in: 6 months with Dr Johney Frame 2)  Your physician recommends that you return for lab work on Monday CMET/ fasting lipid 3)  Your physician has recommended you make the following change in your medication: stop Coumadin start Pradaxa 150mg  two times a day Prescriptions: PRADAXA 150 MG CAPS (DABIGATRAN ETEXILATE MESYLATE) one by mouth two times a day  #180 x 3   Entered by:   Dennis Bast, RN, BSN   Authorized by:   Hillis Range, MD   Signed by:   Dennis Bast, RN, BSN on 11/18/2009   Method used:   Faxed to ...       [facility] ([facility]       [address] [place], [place]  [address]       Ph: [phone]       Fax: [phone]   RxID:   [id number] PRADAXA 150 MG CAPS (DABIGATRAN ETEXILATE MESYLATE) one by mouth two times a day  #180 x 3   Entered by:   Dennis Bast, RN, BSN   Authorized by:   Hillis Range, MD   Signed by:   Dennis Bast, RN, BSN on 11/18/2009   Method used:   Print then Give to Patient   RxID:   [id number] PRADAXA 150 MG CAPS (DABIGATRAN ETEXILATE MESYLATE) one by mouth two times a day  #60 x 1   Entered by:   Dennis Bast, RN, BSN   Authorized by:   Hillis Range, MD   Signed by:   Dennis Bast, RN, BSN on 11/18/2009   Method used:   Electronically to        [facility] (retail)       [address]       [place], [place]  [address]       Ph:  [phone] or [phone]       Fax: [phone]   RxID:   [id number]

## 2010-09-06 NOTE — Medication Information (Signed)
Summary: rov/ewj  Anticoagulant Therapy  Managed by: Bethena Midget, RN, BSN Referring MD: Hillis Range PCP: Newt Lukes MD Supervising MD: Tenny Craw MD, Gunnar Fusi Indication 1: Atrial Fibrillation (ICD-427.31) Lab Used: [facility] Site: [facility] INR POC 2.7 INR RANGE 2 - 3  Dietary changes: no    Health status changes: no    Bleeding/hemorrhagic complications: no    Recent/future hospitalizations: no    Any changes in medication regimen? no    Recent/future dental: no  Any missed doses?: no       Is patient compliant with meds? yes       Allergies: No Known Drug Allergies  Anticoagulation Management History:      The patient is taking warfarin and comes in today for a routine follow up visit.  Negative risk factors for bleeding include an age less than 88 years old.  The bleeding index is 'low risk'.  Positive CHADS2 values include History of HTN.  Negative CHADS2 values include Age > 32 years old.  The start date was 05/16/2008.  Her last INR was 2.6 RATIO.  Anticoagulation responsible provider: Tenny Craw MD, Gunnar Fusi.  INR POC: 2.7.  Cuvette Lot#: [id number].  Exp: 11/2010.    Anticoagulation Management Assessment/Plan:      The patient's current anticoagulation dose is Coumadin 2.5 mg tabs: use as directed per anticoagulation clinic..  The target INR is 2 - 3.  The next INR is due 10/07/2009.  Anticoagulation instructions were given to patient.  Results were reviewed/authorized by Bethena Midget, RN, BSN.  She was notified by Bethena Midget, RN, BSN.         Prior Anticoagulation Instructions: INR 2.7  Continue on same dosage 1 tablet daily except 2 tablets on Mondays, Wednesdays, and Fridays.   Recheck in 4 weeks.    Current Anticoagulation Instructions: INR 2.7 Continue 2.5mg s daily except 5mg s on Mondays, Wednesdays and Fridays. Recheck in 4 weeks.

## 2010-09-06 NOTE — Progress Notes (Signed)
Summary: pradaxa  Phone Note Call from Patient Call back at Home Phone [phone] Call back at [phone]   Caller: Patient Reason for Call: Talk to Nurse Summary of Call: pt wants to start taking the pradaxa and needs to talk to you about the coumadin Initial call taken by: Edman Circle,  January 18, 2010 12:38 PM  Follow-up for Phone Call        will have her INR checked on 01/25/10 and if it is < 2.0 will start Pradaxa Dennis Bast, RN, BSN  January 18, 2010 3:27 PM

## 2010-09-06 NOTE — Progress Notes (Signed)
Summary: Switching to Pradaxa   Phone Note Outgoing Call   Call placed by: Weston Brass PharmD,  November 23, 2009 8:57 AM Call placed to: Patient Summary of Call: Called pt to inform her INR was <2.0 and it was okay to start Pradaxa.  Pt stated since her visit last week, her mother's health has declined and she has to go to [place] to help take care of her.  She preferred not to start a new medication while she is gone, so she restarted taking her Coumadin.  She skipped Thursday night but then restarted Friday with 2 tablets then 1 tablet on Saturday and Sunday.  Instructed pt to continue on her current dose.  She is going to find a lab to have her INR redrawn in [place] the first week of May and have results faxed to [place].  Her cell phone number is [phone].  She will plan on transitioning to Pradaxa once she is back in [place].  Initial call taken by: Weston Brass PharmD,  November 23, 2009 9:01 AM

## 2010-09-26 ENCOUNTER — Telehealth: Payer: Self-pay | Admitting: Internal Medicine

## 2010-09-28 ENCOUNTER — Telehealth: Payer: Self-pay | Admitting: Internal Medicine

## 2010-10-04 NOTE — Progress Notes (Signed)
Summary: rx refill  Phone Note Refill Request Message from:  Patient on September 26, 2010 4:51 PM  Refills Requested: Medication #1:  DILTIAZEM HCL 30 MG TABS as needed [phone]   Method Requested: Telephone to Pharmacy Initial call taken by: Roe Coombs,  September 26, 2010 4:52 PM    Prescriptions: DILTIAZEM HCL 30 MG TABS (DILTIAZEM HCL) as needed  #30 x 1   Entered by:   Laurance Flatten CMA   Authorized by:   Hillis Range, MD   Signed by:   Laurance Flatten CMA on 09/28/2010   Method used:   [id number]       [address]       [place], [place]  [address]       Ph: [phone] or [phone]       Fax: [phone]   RxID:   [id number]

## 2010-10-04 NOTE — Progress Notes (Signed)
Summary: need clarification on directions  Phone Note Refill Request Message from:  Pharmacy  Refills Requested: Medication #1:  DILTIAZEM HCL 30 MG TABS as needed Pharm needs clarification on directions before they can fill the script [facility] [address]  Initial call taken by: Omer Jack,  September 28, 2010 12:21 PM  Follow-up for Phone Call        notified pharmacy  diltiazem  30mg   by mouth q 6 hours as needed palpatations. Follow-up by: Laurance Flatten CMA,  September 28, 2010 3:06 PM

## 2010-12-20 NOTE — Discharge Summary (Signed)
NAMEKENDAHL, [name]             ACCOUNT NO.:  [id number]   MEDICAL RECORD NO.:  [id number]          PATIENT TYPE:  INP   LOCATION:  1423                         FACILITY:  [facility]   PHYSICIAN:  Hillis Range, MD       DATE OF BIRTH:  02/09/1946   DATE OF ADMISSION:  05/15/2008  DATE OF DISCHARGE:  05/18/2008                               DISCHARGE SUMMARY   PRIMARY CARDIOLOGIST:  Dr. Hillis Range.   PROCEDURES PERFORMED DURING HOSPITALIZATION:  Transesophageal  echocardiography cardioversion per Dr. Marca Ancona on May 18, 2008.  A.  Normal left ventricular size and function with an ejection fraction  of 60%.  B.  Right ventricle - normal size and function.  C.  Left atrium - no clot in left atrial appendage.  Appendages Doppler  reveals a velocity greater than 40 cm/sec, mild left atrial enlargement.  D.  Mitral valve - no prolapse, trace mitral regurgitation.  E.  Tricuspid valve - trace tricuspid regurgitation.  F.  Aorta - normal size including root, grade 2 atheroma.  G.  Aortic valve -  trileaflet with no aortic stenosis and aortic  insufficiency.   FINAL DISCHARGE DIAGNOSES:  1. Atrial fibrillation with rapid ventricular response.  2. Leukocytosis.  3. Hypertension.   HOSPITAL COURSE:  This is a 65 year old Caucasian female with history of  hypertension, mitral valve prolapse, who presented with new onset of  atrial fibrillation.  The patient was started on IV diltiazem and  heparin drip along with p.o. Coumadin.  Heart rate was currently  controlled.  The patient had a TEE cardioversion which was completed by  Dr. Marca Ancona on May 18, 2008.  The patient tolerated the  procedure well without any difficulties.  The patient was maintained in  normal sinus rhythm postprocedure and has been placed on Cardizem p.o.  along with continuation of p.o. Coumadin until INR therapeutic.  The  patient will have Lovenox bridging on discharge with quick followup in  our  office for PT/INR check along with a follow up with Dr. Johney Frame.  The  patient is without complaints at this time and is currently mildly  sedated postprocedure.  Vital signs, 122/57, heart rate 80, respirations  20, sats 97% on room air.   DISCHARGE LABORATORY:  Sodium 142, potassium 3.8, chloride 102, CO2 26,  BUN 12, creatinine 0.78, and glucose 92.  PT 21.1, INR 1.7.  Hemoglobin  15.4, hematocrit 45.4, white blood cells 9.5, and platelets 222.   DISCHARGE MEDICATIONS:  1. Lovenox 80 mg subcutaneously q.12 h. (prescription provided).  2. Cardizem 300 mg daily.  3. Coumadin 5 mg daily, prescriptions are provided.  4. Lisinopril 20 mg daily.  5. Aspirin 81 mg daily.  6. Activa daily.  7. Vitamin D daily.  8. Multivitamin daily.  9. Calcium daily.  10.Aleve p.r.n.  11.Pepcid AC p.r.n.  12.Benadryl p.r.n.   ALLERGIES:  No known drug allergies.   NOTE:  The patient is asked to stop Norvasc 5 mg daily.   FOLLOWUP PLANS AND APPOINTMENTS:  1. The patient is scheduled to see Dr. Hillis Range on  June 08, 2008, at 4:00 p.m.  2. The patient is scheduled to the Coumadin Clinic on May 21, 2008, at 3:15 p.m. for PT/INR check.  3. The patient has been advised on Lovenox self injection which she is      to continue every 12 hours for the next 5 days until PT/INR is      therapeutic, which will be advised by Coumadin Clinic.   Time spent with the patient to include physician time 40 minutes.      Bettey Mare. Lyman Bishop, NP      Hillis Range, MD  Electronically Signed    KML/MEDQ  D:  05/18/2008  T:  05/19/2008  Job:  [id number]

## 2010-12-20 NOTE — Op Note (Signed)
[name], [name]             ACCOUNT NO.:  [id number]   MEDICAL RECORD NO.:  [id number]          PATIENT TYPE:  INP   LOCATION:                               [facility]ACILITY[facility]  [facility]   PHYSICIAN:  Marca Ancona, MD      DATE OF BIRTH:  11-23-1945   DATE OF PROCEDURE:  05/18/2008  DATE OF DISCHARGE:                               OPERATIVE REPORT   DC CARDIOVERSION   INDICATIONS:  New-onset atrial fibrillation.   PROCEDURE:  After informed consent was obtained, transesophageal  echocardiogram was done.  The patient was found to have no thrombus in  the left atrial appendage and LV function was normal with EF of 60%.  3  mg of Versed and 75 mcg of fentanyl were used for sedation for the  transesophageal echocardiogram.  After completion of the procedure, the  patient was deemed and acceptable for cardioversion.  The patient's  rhythm was atrial fibrillation with a rate of 110.  Anesthesia was  summoned to the procedure room.  The patient was anesthetized with  Pentothal.  After deep sedation was obtained, the patient underwent  synchronized DC cardioversion at 200 joules biphasic and with one shock,  the patient was converted to normal sinus rhythm.  She maintained normal  sinus rhythm during observation here in the endoscopy lab.   PLAN:  The patient should continue Lovenox until her INR is greater than  2 on Coumadin.  She will need to continue Coumadin for at least 4 to 6  weeks with a therapeutic INR.  After the cardioversion, long-term  Coumadin may not be necessary given her CHADS2 score of 1 for  hypertension.  However, she will have a followup appointment with Dr.  Johney Frame before making that decision.      Marca Ancona, MD  Electronically Signed     DM/MEDQ  D:  05/18/2008  T:  05/19/2008  Job:  [id number]

## 2010-12-20 NOTE — Assessment & Plan Note (Signed)
[facility]                         ELECTROPHYSIOLOGY OFFICE NOTE   NAME:HACKETTLenell, Mcconnell                    MRN:          [id number]  DATE:06/08/2008                            DOB:          28-Oct-1945    INTRODUCTION:  [name] is a pleasant 65 year old female with a  history of hypertension, mitral valve prolapse, and recently diagnosed  paroxysmal atrial fibrillation, who presents today for followup.   PROBLEM LIST:  1. Paroxysmal atrial fibrillation.  2. Hypertension.  3. Mitral valve prolapse.   MEDICATIONS:  1. Coumadin to maintain an INR between 2 and 3.  2. Cardizem CD 240 mg daily.  3. Activella 0.1/0.5 mg daily.   ALLERGIES:  No known drug allergies.   INTERVAL HISTORY:  [name] presents today for followup.  She reports  doing very well since her recent discharge from [facility] on  May 18, 2008.  At that time, she presented with atrial fibrillation  with rapid ventricular rates and required cardioversion.  She was  initiated on Coumadin as well as Cardizem.  Since discharge, she reports  several episodes of short palpitations lasting less than 5 seconds  each.  She is unaware of any triggers for these episodes.  She denies  any sustained atrial fibrillation.  She also denies chest pain,  shortness of breath, presyncope or syncope.  She is otherwise without  complaint today.   PHYSICAL EXAMINATION:  VITAL SIGNS:  Blood pressure 140/80, heart rate  72, respirations 18, weight 178 pounds.  GENERAL:  The patient is a well-appearing female in no acute distress.  She is alert and oriented x3.  HEENT:  Normocephalic, atraumatic.  Sclerae are clear.  Conjunctivae  pink.  Oropharynx clear.  NECK:  Supple.  No JVD, lymphadenopathy, or bruits.  LUNGS:  Clear to auscultation bilaterally.  HEART:  Regular rate and rhythm.  No murmurs, rubs, or gallops.  GASTROINTESTINAL:  Soft, nontender, nondistended, positive bowel  sounds.  EXTREMITIES:  No clubbing, cyanosis, or edema.  NEUROLOGIC:  Strength and sensation are intact.  PSYCHIATRIC:  Euthymic mood, full affect.   EKG normal sinus rhythm at 70 beats per minute, otherwise normal EKG.   IMPRESSION:  [name] is a very pleasant 65 year old female with  paroxysmal atrial fibrillation and hypertension, who presents for today  for followup.  She is currently doing well with anticoagulation with  Coumadin and Cardizem drug therapy.  Therapeutic strategies for atrial  fibrillation including both medicine and catheter-based therapies were  again discussed with the patient in detail today.  Presently, she does  not feel that her symptoms warrant initiation of an antiarrhythmic  medication.  I also briefly discussed the possibility of a pill and  pocket approach with flecainide.  Again, the patient does not wish to  initiate antiarrhythmic medications at this time, but opts to continue  her present strategy of Coumadin and Cardizem.  We have reviewed her  CHADS2 score again today in clinic.  The patient is aware that long-term  we may be able to treat her with aspirin 325 mg daily.  Presently, she  is tolerating  Coumadin quite well.  Until we are able to better  determine the frequency and nature of her atrial fibrillation, I think  that we should continue this.   PLAN:  1. Cardizem 240 mg daily.  2. Coumadin to maintain an INR between 2 and 3.  3. Return to clinic in 3 months.  The patient is aware that she may      contact my office should she develop any further symptomatic      sustained episodes of atrial fibrillation in the interim.     Hillis Range, MD  Electronically Signed    JA/MedQ  DD: 06/08/2008  DT: 06/09/2008  Job #: [id number]

## 2010-12-20 NOTE — H&P (Signed)
NAMELAURINDA, [name] NO.:  [id number]   MEDICAL RECORD NO.:  [id number]          PATIENT TYPE:  EMS   LOCATION:  ED                           [facility]  [facility]   PHYSICIAN:  Gordy Savers, MDDATE OF BIRTH:  10/27/1945   DATE OF ADMISSION:  05/15/2008  DATE OF DISCHARGE:                              HISTORY & PHYSICAL   CHIEF COMPLAINT:  Palpitations.   HISTORY OF PRESENT ILLNESS:  The patient is a 65 year old white female  who was stable until approximately 2 weeks ago. At that time she  presented to her primary care Luby Seamans after an episode of weakness,  dizziness and near-syncope while driving.  At that time she really noted  no significant palpitations.  Over this past couple of weeks she has  noted some irregular palpitations and 3 days ago was placed on an event  monitor.  Over the past week the patient has had a 2D echocardiogram  performed, results pending.  She has also had laboratory studies  including TSH that had been normal.  Today the patient experienced an  episode of weakness and a general sense of unwellness associated with a  sensation of a rapid irregular heart rate.  She presented to the  emergency department where she was noted to be in atrial fibrillation  with a rapid ventricular response.  She is now admitted for further  evaluation and treatment.   PAST MEDICAL HISTORY:  The patient has a 6-year history of hypertension.  She also has a history of mitral valve prolapse.  She is  gravida 2,  para 1, abortus zero.  Surgeries have included a remote tonsillectomy  and a D&C.   FAMILY HISTORY:  Noncontributory.  Father died at age 35 of leukemia.  Mother age 25, history of hypertension and mild heart failure.  One  brother has prostate cancer.   SOCIAL HISTORY:  Married, nondrinker, nonsmoker.   EXAMINATION:  GENERAL:  Reveals a well-developed, healthy-appearing  white female in no acute distress.  VITAL SIGNS:  O2 saturation 98%,  heart rate between 100 and 120.  Rhythm  irregular.  SKIN:  Warm and dry without rash.  HEAD AND NECK:  Reveals normal pupil responses.  Conjunctivae clear.  ENT unremarkable.  There is no thyroid enlargement.  Neck reveals no  neck vein distention or bruits.  CHEST:  Clear.  CARDIOVASCULAR:  Reveals an irregular rhythm without murmur, no click  was noted.  ABDOMEN:  Soft and nontender.  No organomegaly.  EXTREMITIES:  Negative.  Peripheral pulses full.   IMPRESSION:  1. New onset atrial fibrillation with rapid ventricular response.  2. Hypertension.   DISPOSITION:  We will continue IV Cardizem.  Her rate has slowed to the  92-120 range.  Will begin IV heparin and oral Coumadin.  Her 2D  echocardiogram will be reviewed.  Depending on her clinical response  will consider a Cardiology evaluation for consideration of rhythm  control drug therapy or possibly cardioversion if she remains in atrial  fibrillation.      Gordy Savers, MD  Electronically Signed     PFK/MEDQ  D:  05/16/2008  T:  05/17/2008  Job:  [id number]

## 2010-12-20 NOTE — Assessment & Plan Note (Signed)
[facility]                         ELECTROPHYSIOLOGY OFFICE NOTE   CALYPSO, HAGARTY                    MRN:          [id number]  DATE:09/24/2008                            DOB:          [birth year]    INTRODUCTION:  Ms. Katayama is a pleasant 65 year old female with  paroxysmal atrial fibrillation and hypertension who presents today for  followup.   PROBLEM LIST:  1. Paroxysmal atrial fibrillation.  2. Hypertension.  3. Mitral valve prolapse.   CURRENT MEDICATIONS:  1. Coumadin.  2. Cardizem 240 mg daily.  3. Activella daily.  4. Lisinopril 40 mg daily.   INTERVAL HISTORY:  Ms. Tome presents today for followup.  She denies  any further symptomatic episodes of atrial fibrillation since last being  seen in our clinic.  She reports occasional flutter lasting less than  5 seconds.  She denies chest pain, shortness of breath, orthopnea, PND,  presyncope, or syncope.  She denies any neurologic complications.  She  has had no difficulty with bleeding while taking Coumadin and is  otherwise without complaint today.   PHYSICAL EXAMINATION:  VITALS:  Blood pressure 144/80, heart rate 73,  respirations 18, weight 182 pounds.  GENERAL:  The patient is a well-appearing female in no acute distress.  She is alert and oriented x3.  HEENT:  Normocephalic, atraumatic.  Sclerae clear.  Conjunctivae pink.  Oropharynx clear.  NECK:  Supple.  No thyromegaly, JVD, or bruits.  LUNGS:  Clear to auscultation bilaterally.  HEART:  Regular rate and rhythm.  No murmurs, rubs, or gallops.  GI:  Soft, nontender, nondistended.  Positive bowel sounds.  EXTREMITIES:  No clubbing, cyanosis, or edema.  NEUROLOGIC:  Strength and sensation are intact.   EKG reveals sinus rhythm at 77 beats per minute and is otherwise normal.   IMPRESSION:  Ms. Ridgely is a pleasant 65 year old female who presents  today for followup.  She has done well with Cardizem treatment for  her  atrial fibrillation.  She is tolerating Coumadin without difficulty.  We  had a discussion today regarding her CHADS2 score of 1.  The American  Heart Association and American College of cardiology guidelines would  support either aspirin or Coumadin in this patient.  I think that as she  is tolerating Coumadin so well, we should continue Coumadin at this  time.  If she has difficulties with anticoagulation in the future then  we might switch her to aspirin at that point.  Her blood pressure is  mildly above goal today.  I have instructed her to continue to follow  her blood pressure at home.  If it remains elevated then we may have to  adjust her medications.  She has not had a recent lipid profile obtained  and therefore we should obtain fasting lipids in the next few days.  Unfortunately, she continues to struggle with weight gain and has gained  4 pounds since last being seen in our clinic.   PLAN:  1. Liver profile, BNP, and fasting lipid profile.  2. No medication changes today.  3. Importance of lifestyle modification including regular exercise and  weight loss were again discussed today.  4. The patient will followup in my office in 6 months.  She is aware      to contact me if any further problems arise.     Hillis Range, MD  Electronically Signed    JA/MedQ  DD: 09/24/2008  DT: 09/25/2008  Job #: [id number]

## 2010-12-20 NOTE — Consult Note (Signed)
[name], [name]             ACCOUNT NO.:  [id number]   MEDICAL RECORD NO.:  [id number]          PATIENT TYPE:  INP   LOCATION:  1423                         FACILITY:  [facility]   PHYSICIAN:  Hillis Range, MD       DATE OF BIRTH:  [birth year]   DATE OF CONSULTATION:  DATE OF DISCHARGE:                                 CONSULTATION   CONSULTING PHYSICIAN:  Ramiro Harvest, MD   REASON FOR CONSULTATION:  Atrial fibrillation management.   HISTORY OF PRESENT ILLNESS:  Ms. Pasion is a pleasant 65 year old  female with a history of hypertension and mitral valve prolapse who was  admitted to the hospital with new onset atrial fibrillation on May 15, 2008.  The patient reports being in good health until approximately 2  weeks ago.  She reports at that time developing an episode of acute  anxiousness with palpitations and dizziness.  She reports that this  episode lasted for approximately 2 hours without associated shortness of  breath, chest discomfort, near syncope, or syncope.  She was evaluated  by her primary care physician and had a vent monitor placed.  The  etiology of the patient's symptoms was not evident at that time.  A  thyroid profile was unremarkable.  A transthoracic echocardiogram was  obtained.  Though, I do not have the full report for review.  The  patient reports that her ejection fraction was apparently normal.  She  reports doing reasonably well until May 15, 2008, in the evening when  she was about to go to go dinner.  She developed an acute sensation of  fatigue and being washed out.  She reports associated nervousness,  palpitations, and heart racing.  She did not have associated  dizziness, chest pain, presyncope, or syncope.  The patient's LifeWatch  monitor recorded atrial fibrillation with a ventricular rate in the 150s  to 160s.  When the patient's symptoms did not abate, she subsequently  presented to [facility] for further care.  Upon  arrival, she  was found to have atrial fibrillation with a rapid ventricular response.  She is therefore admitted for further care.   PAST MEDICAL HISTORY:  1. Hypertension.  2. Mitral valve prolapse.  3. Molar pregnancy.  4. Status post D&C.  5. Status post tonsillectomy.   MEDICATIONS:  1. Lisinopril 20 mg daily.  2. Norvasc 5 mg daily.  3. Aspirin 81 mg daily.  4. Activella.  5. Multivitamin.  6. Vitamin D.  7. Calcium.   ALLERGIES:  No known drug allergies.   SOCIAL HISTORY:  The patient lives in [place] with her spouse and 20-  year-old son.  She denies tobacco, alcohol, or drug use.   FAMILY HISTORY:  The patient's father died of leukemia.  Her mother is  alive and well at age 9.  She has hypertension and congestive heart  failure.   REVIEW OF SYSTEMS:  All systems were reviewed and negative except as  outlined in the HPI above.   PHYSICAL EXAMINATION:  VITAL SIGNS:  Blood pressure 145/70, heart rate  87, respirations 18, sats 100%  on room air, and temperature 97.6.  GENERAL:  The patient is a well-appearing female in no acute distress.  She is alert and oriented x3.  HEENT:  Normocephalic and atraumatic.  Sclerae clear.  Conjunctivae  pink.  Oropharynx clear.  NECK:  Supple.  No JVD, lymphadenopathy, or bruits.  LUNGS:  Clear to auscultation bilaterally.  HEART:  Irregularly irregular rhythm.  No murmurs, rubs, or gallops.  GI:  Soft, nontender, and nondistended.  Positive bowel sounds.  EXTREMITIES:  No clubbing, cyanosis, or edema.  NEUROLOGIC:  Strength and sensation are intact.  SKIN:  No ecchymosis or lacerations.  MUSCULOSKELETAL:  No deformity or atrophy.  PSYCH:  Euthymic mood and full affect.   LABORATORY DATA:  White blood cell count 10.3, hematocrit 46, INR 0.8,  sodium 141, potassium 3.8, creatinine 0.5,      CK 105, CK-MB 1.3, and  troponin less than 0.01.  Urinalysis unremarkable.   EKG:  Atrial fibrillation with a ventricular rate of 118  beats per  minute.  No acute ST or T wave changes are present.   IMPRESSION:  Ms. Mickelson is a very pleasant 65 year old female who  presents with new onset symptomatic atrial fibrillation.  The patient's  thyroid profile is normal.  She is not known to have  structural heart  disease, so I do not have her full echocardiogram report to review at  this time.  The patient is currently hemodynamically stable and her  heart rate is reasonably well controlled.  She has been initiated on  anticoagulation with heparin appropriately.   PLAN:  Therapeutic strategies for atrial fibrillation including both  rate and rhythm control were discussed in detail with the patient.  At  present  time, I would recommend that we continue intravenous  anticoagulation with heparin until her INR is therapeutic on Coumadin.  I think that the best strategy would be to continue rate control with  diltiazem as you are doing.  If the patient does not spontaneously  convert to sinus rhythm over the weekend, I think that we should then  proceed with a transesophageal echocardiogram followed by DC  cardioversion on Monday.  We will then formulate a long-term plan for  atrial fibrillation therapy for the patient thereafter.  Thank you for  the opportunity to participate in the care of Ms. Raechel Ache.      Hillis Range, MD  Electronically Signed     JA/MEDQ  D:  05/16/2008  T:  05/16/2008  Job:  [id number]   cc:   Bethel Born, MD

## 2010-12-23 NOTE — Op Note (Signed)
[address]  Patient:    [name], [name]                    MRN: [id number] Proc. Date: 11/26/00 Adm. Date:  [id number] Attending:  Loura Halt Ii CC:         Hope M. Danella Deis, M.D.   Operative Report  PREOPERATIVE DIAGNOSIS:  Previous biopsy of dysplastic nevus, forehead, left of midline, just above glabellar area, 1 cm biopsy site.  POSTOPERATIVE DIAGNOSIS:  Previous biopsy of dysplastic nevus, forehead, left of midline, just above glabellar area, 1 cm biopsy site.  PROCEDURE:  Excision of previous vertically-oriented biopsy site of dysplastic nevus, left lower forehead, just above glabellar area.  SURGEON: Alfredia Ferguson, M.D.  ANESTHESIA:  2% Xylocaine, 1:100,000 epinephrine.  INDICATION FOR SURGERY:  This is a 64 year old woman who had a pigmented nevus removed from her forehead area.  It returned dysplastic nevus.  Margins were close, and recommendation for further excision was made.  Patient has a slightly less than 1 cm vertically-oriented scar just to the left of midline in the lower forehead above the glabellar area.  There is no residual pigmentation.  The plan is to excise this lesion and reorient the scar to a transverse direction.  Patient understands the risk of unsightly scarring, having positive margins, and also finding no residual dysplastic nevus.  In spite of that, she wishes to proceed.  DESCRIPTION OF PROCEDURE:  Skin marks were placed in an elliptical fashion, transversely oriented around the existing scar.  Local anesthesia was infiltrated, and the area was prepped and draped in a sterile fashion.  After waiting approximately 10 minutes, an elliptical excision of the lesion down to the level of the subcutaneous tissue was carried out.  The lesion was passed off for pathology.  Wound edges were undermined for a distance of 4-5 mm in all directions.  Hemostasis accomplished using electrocautery.  The wound  was closed by approximating the dermis using interrupted 4-0 Vicryl suture.  Skin was united in a running 6-0 nylon suture.  The patient tolerated the procedure well.  A light dressing was applied, and the patient was discharged to home in satisfactory condition. DD:  11/26/00 TD:  11/27/00 Job: [id number]

## 2011-01-17 ENCOUNTER — Other Ambulatory Visit: Payer: Self-pay | Admitting: *Deleted

## 2011-01-17 MED ORDER — DABIGATRAN ETEXILATE MESYLATE 150 MG PO CAPS: 150.0000 mg | ORAL_CAPSULE | Freq: Two times a day (BID) | ORAL | Status: DC

## 2011-02-13 ENCOUNTER — Telehealth: Payer: Self-pay | Admitting: Internal Medicine

## 2011-02-13 NOTE — Telephone Encounter (Signed)
Pt states she is sch for colonoscopy on 8/9 with Dr Kinnie Scales and they told her to hold her Pradaxa 5 days prior she feels that is too long and wanted to check w/us advised she shouldn't need to hold it that long will send to Dr Johney Frame to review

## 2011-02-13 NOTE — Telephone Encounter (Signed)
Pt calling having colonoscopy Aug 9th, pt was advised to get off blood thinners. Pt is concerned about being off blood thinners for that period of time. Pt would like to be advised.

## 2011-02-14 NOTE — Telephone Encounter (Signed)
I have form for Dr Johney Frame to review in his folder

## 2011-02-15 NOTE — Telephone Encounter (Signed)
Form faxed and scanned in system

## 2011-02-23 ENCOUNTER — Telehealth: Payer: Self-pay | Admitting: Internal Medicine

## 2011-02-23 MED ORDER — DABIGATRAN ETEXILATE MESYLATE 150 MG PO CAPS: 150.0000 mg | ORAL_CAPSULE | Freq: Two times a day (BID) | ORAL | Status: DC

## 2011-02-23 NOTE — Telephone Encounter (Signed)
Addended by: Judithe Modest D on: 02/23/2011 02:22 PM   Modules accepted: Orders

## 2011-02-23 NOTE — Telephone Encounter (Signed)
pradaxa 150 mg. [facility] [address].

## 2011-03-03 ENCOUNTER — Other Ambulatory Visit: Payer: Self-pay | Admitting: *Deleted

## 2011-03-03 MED ORDER — DABIGATRAN ETEXILATE MESYLATE 150 MG PO CAPS: 150.0000 mg | ORAL_CAPSULE | Freq: Two times a day (BID) | ORAL | Status: DC

## 2011-03-03 NOTE — Telephone Encounter (Signed)
pradaxa sent to [facility] today. Danielle Rankin

## 2011-03-29 ENCOUNTER — Other Ambulatory Visit: Payer: Self-pay | Admitting: Internal Medicine

## 2011-05-09 LAB — BASIC METABOLIC PANEL
BUN: 11
BUN: 12
BUN: 9
CO2: 22
CO2: 26
CO2: 27
Calcium: 8.7
Calcium: 8.7
Chloride: 110
Chloride: 111
Creatinine, Ser: 0.54
Creatinine, Ser: 0.73
Creatinine, Ser: 0.78
GFR calc Af Amer: >60
GFR calc non Af Amer: >60
Glucose, Bld: 92
Potassium: 3.8
Sodium: 142

## 2011-05-09 LAB — CBC
HCT: 46.3 — ABNORMAL HIGH
Hemoglobin: 15.4 — ABNORMAL HIGH
Hemoglobin: 15.6 — ABNORMAL HIGH
MCHC: 33.2
MCHC: 33.8
MCHC: 34
MCHC: 34
MCV: 90.9
MCV: 93.2
Platelets: 222
Platelets: 227
Platelets: 232
RBC: 4.82
RBC: 5.01
RDW: 12.9
RDW: 13
RDW: 13.1

## 2011-05-09 LAB — CARDIAC PANEL(CRET KIN+CKTOT+MB+TROPI)
CK, MB: 1
CK, MB: 1.2
Relative Index: INVALID
Relative Index: INVALID
Troponin I: 0.01
Troponin I: <0.01

## 2011-05-09 LAB — COMPREHENSIVE METABOLIC PANEL
AST: 22
Albumin: 3.9
Calcium: 9.5
Creatinine, Ser: 0.68
GFR calc Af Amer: >60
GFR calc non Af Amer: >60

## 2011-05-09 LAB — PROTIME-INR
INR: 1.7 — ABNORMAL HIGH
Prothrombin Time: 11.4 — ABNORMAL LOW
Prothrombin Time: 15.9 — ABNORMAL HIGH
Prothrombin Time: 21.2 — ABNORMAL HIGH

## 2011-05-09 LAB — URINALYSIS, ROUTINE W REFLEX MICROSCOPIC
Bilirubin Urine: NEGATIVE
Glucose, UA: NEGATIVE
Ketones, ur: NEGATIVE
Protein, ur: NEGATIVE

## 2011-05-09 LAB — POCT CARDIAC MARKERS
CKMB, poc: 1.3
Myoglobin, poc: 46.3
Troponin i, poc: <0.05

## 2011-05-09 LAB — HEPARIN LEVEL (UNFRACTIONATED)
Heparin Unfractionated: 0.46
Heparin Unfractionated: 0.74 — ABNORMAL HIGH

## 2011-05-09 LAB — B-NATRIURETIC PEPTIDE (CONVERTED LAB): Pro B Natriuretic peptide (BNP): 175 — ABNORMAL HIGH

## 2011-05-09 LAB — DIFFERENTIAL
Eosinophils Relative: 0
Lymphocytes Relative: 22
Lymphs Abs: 2.5
Monocytes Absolute: 0.7

## 2011-06-19 ENCOUNTER — Other Ambulatory Visit (HOSPITAL_COMMUNITY): Payer: Self-pay | Admitting: Obstetrics & Gynecology

## 2011-06-19 DIAGNOSIS — Z1231 Encounter for screening mammogram for malignant neoplasm of breast: Secondary | ICD-10-CM

## 2011-06-20 ENCOUNTER — Other Ambulatory Visit: Payer: Self-pay | Admitting: Internal Medicine

## 2011-06-22 ENCOUNTER — Other Ambulatory Visit: Payer: Self-pay

## 2011-06-22 MED ORDER — HYDROCHLOROTHIAZIDE 25 MG PO TABS: 25.0000 mg | ORAL_TABLET | Freq: Every day | ORAL | Status: DC

## 2011-06-28 ENCOUNTER — Telehealth: Payer: Self-pay | Admitting: Internal Medicine

## 2011-06-28 ENCOUNTER — Ambulatory Visit (INDEPENDENT_AMBULATORY_CARE_PROVIDER_SITE_OTHER): Payer: BC Managed Care – PPO

## 2011-06-28 VITALS — BP 118/52 | HR 80 | Ht 64.0 in | Wt 179.0 lb

## 2011-06-28 DIAGNOSIS — I4891 Unspecified atrial fibrillation: Secondary | ICD-10-CM

## 2011-06-28 NOTE — Progress Notes (Signed)
Patient ID: [name], female   DOB: 12-Apr-1946, 65 y.o.   MRN: [id number] Patient called today c/o feeling like she was in afib so I asked that she come in for an EKG.  She was in NSR w/PAC's HR 80 Dr Johney Frame reviewed EKG.  Her BP was 118/52  She thought this was low for her.  I advised her this was a good BP.  She has a follow up appointment on 07/14/11 and will keep that appointment Anselm Pancoast

## 2011-06-28 NOTE — Telephone Encounter (Signed)
New Msg: Pt calling stating that she thinks she might be in a-fib. Pt also c/o pain when she takes a deep breath. Pt said she thought she just pulled a muscle at the gym b/c it has happened before. Pt said Dr. Johney Frame told her to get an EKG if she ever feels like she is in a-fib. Please return pt call to discuss further.

## 2011-06-28 NOTE — Telephone Encounter (Signed)
Returned call to patient , she feels like she is in afib.  This has been going on since 8:00am.  Couple of days this weeks hurts when she takes a deep breath.  Feels shaky, not like when she was in fast afib when she went to hospital.  She took an extra Diltizem 30mg  this morning.  She doesn't know if she is nervous or if she truly is out of rhythm  She is going to come over for an EKG now.

## 2011-08-11 ENCOUNTER — Encounter: Payer: Self-pay | Admitting: Internal Medicine

## 2011-08-11 ENCOUNTER — Encounter: Payer: Self-pay | Admitting: *Deleted

## 2011-08-14 ENCOUNTER — Ambulatory Visit (INDEPENDENT_AMBULATORY_CARE_PROVIDER_SITE_OTHER): Payer: BC Managed Care – PPO | Admitting: Internal Medicine

## 2011-08-14 ENCOUNTER — Encounter: Payer: Self-pay | Admitting: Internal Medicine

## 2011-08-14 DIAGNOSIS — I4891 Unspecified atrial fibrillation: Secondary | ICD-10-CM

## 2011-08-14 DIAGNOSIS — E782 Mixed hyperlipidemia: Secondary | ICD-10-CM

## 2011-08-14 DIAGNOSIS — I10 Essential (primary) hypertension: Secondary | ICD-10-CM

## 2011-08-14 NOTE — Patient Instructions (Addendum)
Your physician wants you to follow-up in: 12 months with Dr Jacquiline Doe will receive a reminder letter in the mail two months in advance. If you don't receive a letter, please call our office to schedule the follow-up appointment.   Your physician recommends that you return for lab work in: 08/17/11 at 8:30  FASTING lipid/liver/bmp/cbc

## 2011-08-14 NOTE — Assessment & Plan Note (Signed)
Stable No change required today Fasting lipids and LFTs

## 2011-08-14 NOTE — Assessment & Plan Note (Signed)
Stable No change required today  Check CBC and BMET on pradaxa today

## 2011-08-14 NOTE — Assessment & Plan Note (Signed)
Stable No change required today  bmet 

## 2011-08-14 NOTE — Progress Notes (Signed)
PCP:  Kristin Paci, MD, MD  The patient presents today for routine electrophysiology followup.  Since last being seen in our clinic, the patient reports doing very well.  Today, she denies symptoms of palpitations, chest pain, shortness of breath, orthopnea, PND, lower extremity edema, dizziness, presyncope, syncope, or neurologic sequela.  The patient feels that she is tolerating medications without difficulties and is otherwise without complaint today.   Past Medical History  Diagnosis Date  . Mixed hyperlipidemia   . MITRAL VALVE PROLAPSE   . HYPERTENSION   . Atrial fibrillation   . SPRAIN/STRAIN, KNEE/LEG NEC   . PALPITATIONS, HX OF   . COCCYGEAL PAIN   . BURSITIS, SUBTROCHANTERIC   . ARTHRITIS, HIPS, BILATERAL    Past Surgical History  Procedure Date  . Tonsillectomy   . Dilation and curettage of uterus     Current Outpatient Prescriptions  Medication Sig Dispense Refill  . dabigatran (PRADAXA) 150 MG CAPS Take 1 capsule (150 mg total) by mouth every 12 (twelve) hours.  180 capsule  3  . diltiazem (CARDIZEM CD) 240 MG 24 hr capsule TAKE 1 CAPSULE ONCE DAILY  90 capsule  1  . hydrochlorothiazide (HYDRODIURIL) 25 MG tablet Take 1 tablet (25 mg total) by mouth daily.  30 tablet  0  . lisinopril (PRINIVIL,ZESTRIL) 40 MG tablet TAKE 1 TABLET DAILY  90 tablet  2  . simvastatin (ZOCOR) 20 MG tablet Take 1 tablet by mouth Daily.      . traMADol (ULTRAM) 50 MG tablet prn        No Known Allergies  History   Social History  . Marital Status: Married    Spouse Name: N/A    Number of Children: N/A  . Years of Education: N/A   Occupational History  . Not on file.   Social History Main Topics  . Smoking status: Former Games developer  . Smokeless tobacco: Not on file  . Alcohol Use: Not on file  . Drug Use: Not on file  . Sexually Active: Not on file   Other Topics Concern  . Not on file   Social History Narrative  . No narrative on file    Family History  Problem  Relation Age of Onset  . Leukemia Father   . Hypertension Mother   . Heart failure Mother     ROS-  All systems are reviewed and are negative except as outlined in the HPI above   Physical Exam: Filed Vitals:   08/14/11 1141  BP: 108/54  Pulse: 83  Height: 5\' 4"  (1.626 m)  Weight: 187 lb (84.823 kg)    GEN- The patient is well appearing, alert and oriented x 3 today.   Head- normocephalic, atraumatic Eyes-  Sclera clear, conjunctiva pink Ears- hearing intact Oropharynx- clear Neck- supple, no JVP Lymph- no cervical lymphadenopathy Lungs- Clear to ausculation bilaterally, normal work of breathing Heart- Regular rate and rhythm, no murmurs, rubs or gallops, PMI not laterally displaced GI- soft, NT, ND, + BS Extremities- no clubbing, cyanosis, or edema MS- no significant deformity or atrophy Skin- no rash or lesion Psych- euthymic mood, full affect Neuro- strength and sensation are intact  Assessment and Plan:

## 2011-08-15 ENCOUNTER — Ambulatory Visit (HOSPITAL_COMMUNITY)
Admission: RE | Admit: 2011-08-15 | Discharge: 2011-08-15 | Disposition: A | Discharge status: Home / Self Care | Payer: BC Managed Care – PPO | Source: Ambulatory Visit | Attending: Obstetrics & Gynecology | Admitting: Obstetrics & Gynecology

## 2011-08-15 DIAGNOSIS — Z1231 Encounter for screening mammogram for malignant neoplasm of breast: Secondary | ICD-10-CM | POA: Insufficient documentation

## 2011-08-17 ENCOUNTER — Other Ambulatory Visit (INDEPENDENT_AMBULATORY_CARE_PROVIDER_SITE_OTHER): Payer: BC Managed Care – PPO | Admitting: *Deleted

## 2011-08-17 DIAGNOSIS — I4891 Unspecified atrial fibrillation: Secondary | ICD-10-CM

## 2011-08-17 DIAGNOSIS — E782 Mixed hyperlipidemia: Secondary | ICD-10-CM

## 2011-08-17 DIAGNOSIS — I10 Essential (primary) hypertension: Secondary | ICD-10-CM

## 2011-08-17 LAB — LIPID PANEL
HDL: 59.6 mg/dL (ref >39.00)
LDL Cholesterol: 85 mg/dL (ref 0–99)
Total CHOL/HDL Ratio: 3
VLDL: 30.6 mg/dL (ref 0.0–40.0)

## 2011-08-17 LAB — CBC WITH DIFFERENTIAL/PLATELET
Basophils Relative: 1.3 % (ref 0.0–3.0)
Hemoglobin: 14.2 g/dL (ref 12.0–15.0)
Lymphocytes Relative: 28.8 % (ref 12.0–46.0)
MCHC: 34.2 g/dL (ref 30.0–36.0)
Monocytes Relative: 8.7 % (ref 3.0–12.0)
Neutro Abs: 4.2 10*3/uL (ref 1.4–7.7)
RBC: 4.41 Mil/uL (ref 3.87–5.11)

## 2011-08-17 LAB — BASIC METABOLIC PANEL
BUN: 21 mg/dL (ref 6–23)
Calcium: 9.2 mg/dL (ref 8.4–10.5)
Chloride: 104 mEq/L (ref 96–112)
GFR: 76.64 mL/min (ref >60.00)
Glucose, Bld: 86 mg/dL (ref 70–99)
Potassium: 3.9 mEq/L (ref 3.5–5.1)

## 2011-08-17 LAB — HEPATIC FUNCTION PANEL: Total Bilirubin: 0.5 mg/dL (ref 0.3–1.2)

## 2011-08-19 ENCOUNTER — Other Ambulatory Visit: Payer: Self-pay | Admitting: Internal Medicine

## 2011-10-24 ENCOUNTER — Other Ambulatory Visit: Payer: Self-pay

## 2011-10-24 MED ORDER — SIMVASTATIN 20 MG PO TABS: 20.0000 mg | ORAL_TABLET | Freq: Every day | ORAL | Status: DC

## 2011-10-25 ENCOUNTER — Other Ambulatory Visit: Payer: Self-pay

## 2011-10-25 MED ORDER — SIMVASTATIN 20 MG PO TABS: 20.0000 mg | ORAL_TABLET | Freq: Every day | ORAL | Status: DC

## 2011-11-09 ENCOUNTER — Ambulatory Visit: Payer: BC Managed Care – PPO | Attending: Orthopedic Surgery | Admitting: Physical Therapy

## 2011-11-09 DIAGNOSIS — IMO0001 Reserved for inherently not codable concepts without codable children: Secondary | ICD-10-CM | POA: Insufficient documentation

## 2011-11-09 DIAGNOSIS — M545 Low back pain, unspecified: Secondary | ICD-10-CM | POA: Insufficient documentation

## 2011-11-09 DIAGNOSIS — M25559 Pain in unspecified hip: Secondary | ICD-10-CM | POA: Insufficient documentation

## 2011-11-13 ENCOUNTER — Ambulatory Visit: Payer: BC Managed Care – PPO | Admitting: Physical Therapy

## 2011-11-16 ENCOUNTER — Ambulatory Visit: Payer: BC Managed Care – PPO | Admitting: Physical Therapy

## 2011-11-20 ENCOUNTER — Ambulatory Visit: Payer: BC Managed Care – PPO | Admitting: Physical Therapy

## 2011-11-22 ENCOUNTER — Ambulatory Visit: Payer: BC Managed Care – PPO | Admitting: Physical Therapy

## 2011-11-28 ENCOUNTER — Ambulatory Visit: Payer: BC Managed Care – PPO | Admitting: Physical Therapy

## 2011-11-28 ENCOUNTER — Other Ambulatory Visit: Payer: Self-pay | Admitting: Internal Medicine

## 2011-12-01 ENCOUNTER — Encounter: Payer: BC Managed Care – PPO | Admitting: Physical Therapy

## 2011-12-04 ENCOUNTER — Telehealth: Payer: Self-pay | Admitting: *Deleted

## 2011-12-04 ENCOUNTER — Other Ambulatory Visit: Payer: Self-pay | Admitting: Internal Medicine

## 2011-12-04 DIAGNOSIS — Z0000 Encounter for general adult medical examination without abnormal findings: Secondary | ICD-10-CM

## 2011-12-04 NOTE — Telephone Encounter (Signed)
Received staff msg pt made cpx need labs in entered in epic... 12/04/11@2 :25pm/LMB

## 2011-12-04 NOTE — Telephone Encounter (Signed)
Message copied by Deatra James on Mon Dec 04, 2011  2:23 PM ------      Message from: Etheleen Sia      Created: Mon Dec 04, 2011  2:10 PM      Regarding: PHYSICAL LABS        PHYSICAL LABS FOR MAY 13 APPT

## 2011-12-05 ENCOUNTER — Ambulatory Visit: Payer: BC Managed Care – PPO | Admitting: Physical Therapy

## 2011-12-07 ENCOUNTER — Encounter: Payer: BC Managed Care – PPO | Admitting: Physical Therapy

## 2011-12-14 ENCOUNTER — Other Ambulatory Visit (INDEPENDENT_AMBULATORY_CARE_PROVIDER_SITE_OTHER): Payer: BC Managed Care – PPO

## 2011-12-14 DIAGNOSIS — Z0000 Encounter for general adult medical examination without abnormal findings: Secondary | ICD-10-CM

## 2011-12-14 LAB — URINALYSIS, ROUTINE W REFLEX MICROSCOPIC
Bilirubin Urine: NEGATIVE
Nitrite: NEGATIVE
Urobilinogen, UA: 0.2 (ref 0.0–1.0)

## 2011-12-14 LAB — CBC WITH DIFFERENTIAL/PLATELET
Basophils Absolute: 0.1 10*3/uL (ref 0.0–0.1)
Eosinophils Absolute: 0.1 10*3/uL (ref 0.0–0.7)
Hemoglobin: 14.4 g/dL (ref 12.0–15.0)
Lymphocytes Relative: 29 % (ref 12.0–46.0)
Lymphs Abs: 2.4 10*3/uL (ref 0.7–4.0)
MCHC: 33.8 g/dL (ref 30.0–36.0)
Neutro Abs: 5 10*3/uL (ref 1.4–7.7)
RDW: 13.3 % (ref 11.5–14.6)

## 2011-12-14 LAB — LIPID PANEL
Cholesterol: 180 mg/dL (ref 0–200)
LDL Cholesterol: 92 mg/dL (ref 0–99)
Triglycerides: 93 mg/dL (ref 0.0–149.0)

## 2011-12-14 LAB — TSH: TSH: 1.23 u[IU]/mL (ref 0.35–5.50)

## 2011-12-14 LAB — HEPATIC FUNCTION PANEL
AST: 19 U/L (ref 0–37)
Albumin: 3.9 g/dL (ref 3.5–5.2)
Alkaline Phosphatase: 82 U/L (ref 39–117)
Total Protein: 6.9 g/dL (ref 6.0–8.3)

## 2011-12-14 LAB — BASIC METABOLIC PANEL
Calcium: 9.3 mg/dL (ref 8.4–10.5)
Creatinine, Ser: 0.7 mg/dL (ref 0.4–1.2)

## 2011-12-18 ENCOUNTER — Ambulatory Visit (INDEPENDENT_AMBULATORY_CARE_PROVIDER_SITE_OTHER): Payer: BC Managed Care – PPO | Admitting: Internal Medicine

## 2011-12-18 ENCOUNTER — Encounter: Payer: Self-pay | Admitting: Internal Medicine

## 2011-12-18 VITALS — BP 110/62 | HR 81 | Temp 97.9°F | Ht 64.0 in | Wt 176.8 lb

## 2011-12-18 DIAGNOSIS — E782 Mixed hyperlipidemia: Secondary | ICD-10-CM

## 2011-12-18 DIAGNOSIS — I4891 Unspecified atrial fibrillation: Secondary | ICD-10-CM

## 2011-12-18 DIAGNOSIS — M159 Polyosteoarthritis, unspecified: Secondary | ICD-10-CM

## 2011-12-18 DIAGNOSIS — M150 Primary generalized (osteo)arthritis: Secondary | ICD-10-CM | POA: Insufficient documentation

## 2011-12-18 DIAGNOSIS — Z0000 Encounter for general adult medical examination without abnormal findings: Secondary | ICD-10-CM

## 2011-12-18 MED ORDER — SIMVASTATIN 20 MG PO TABS: 20.0000 mg | ORAL_TABLET | Freq: Every day | ORAL | Status: DC

## 2011-12-18 MED ORDER — LISINOPRIL 40 MG PO TABS: 40.0000 mg | ORAL_TABLET | Freq: Every day | ORAL | Status: DC

## 2011-12-18 MED ORDER — HYDROCHLOROTHIAZIDE 25 MG PO TABS: 25.0000 mg | ORAL_TABLET | Freq: Every day | ORAL | Status: DC

## 2011-12-18 NOTE — Progress Notes (Signed)
Subjective:    Patient ID: [name], female    DOB: 22-Jun-1946, 66 y.o.   MRN: [id number]  HPI  patient is here today for annual physical. Patient feels well overall.  complains of diffuse arthritis and bursitis pains Working with ortho on same, recent eval by NSurg - symptoms NOT attributable to back On pred taper from ortho, symptoms improved Does not use NSAIDs due to anticoag for AF (per cards) Advised to see rheum - dr Dareen Piano but no appt yet scheduled  Past Medical History  Diagnosis Date  . Mixed hyperlipidemia   . MITRAL VALVE PROLAPSE   . HYPERTENSION   . Atrial fibrillation     chronic anticoag - Pradaxa  . BURSITIS, SUBTROCHANTERIC   . ARTHRITIS, HIPS, BILATERAL    Family History  Problem Relation Age of Onset  . Leukemia Father   . Hypertension Mother   . Heart failure Mother    History  Substance Use Topics  . Smoking status: Former Games developer  . Smokeless tobacco: Not on file  . Alcohol Use: Not on file     Review of Systems Constitutional: Negative for fever or unexpected weight change.  Respiratory: Negative for cough and shortness of breath.   Cardiovascular: Negative for chest pain or palpitations.  Gastrointestinal: Negative for abdominal pain, no bowel changes.  Musculoskeletal: progressive gait problem due to leg pain (laterally R>L), no joint swelling.  Skin: Negative for rash.  Neurological: Negative for dizziness or headache.  No other specific complaints in a complete review of systems (except as listed in HPI above).     Objective:   Physical Exam BP 110/62  Pulse 81  Temp(Src) 97.9 F (36.6 C) (Oral)  Ht 5\' 4"  (1.626 m)  Wt 176 lb 12.8 oz (80.196 kg)  BMI 30.35 kg/m2  SpO2 98% Wt Readings from Last 3 Encounters:  12/18/11 176 lb 12.8 oz (80.196 kg)  08/14/11 187 lb (84.823 kg)  06/28/11 179 lb (81.194 kg)   Constitutional: She is overweight, appears well-developed and well-nourished. No distress.  HENT: Head:  Normocephalic and atraumatic. Ears: B TMs ok, no erythema or effusion; Nose: Nose normal. Mouth/Throat: Oropharynx is clear and moist. No oropharyngeal exudate.  Eyes: Conjunctivae and EOM are normal. Pupils are equal, round, and reactive to light. No scleral icterus.  Neck: Normal range of motion. Neck supple. No JVD present. No thyromegaly present.  Cardiovascular: Normal rate, regular rhythm and normal heart sounds.  No murmur heard. No BLE edema. Pulmonary/Chest: Effort normal and breath sounds normal. No respiratory distress. She has no wheezes.  Abdominal: Soft. Bowel sounds are normal. She exhibits no distension. There is no tenderness. no masses Musculoskeletal: OA change B hands PIP and DIP - no synovitis. Normal range of motion, no joint effusions. No gross deformities Neurological: She is alert and oriented to person, place, and time. No cranial nerve deficit. Coordination normal.  Skin: Skin is warm and dry. No rash noted. No erythema.  Psychiatric: She has a normal mood and affect. Her behavior is normal. Judgment and thought content normal.   Lab Results  Component Value Date   WBC 8.2 12/14/2011   HGB 14.4 12/14/2011   HCT 42.7 12/14/2011   PLT 228.0 12/14/2011   GLUCOSE 92 12/14/2011   CHOL 180 12/14/2011   TRIG 93.0 12/14/2011   HDL 69.00 12/14/2011   LDLDIRECT 149.3 09/25/2008   LDLCALC 92 12/14/2011   ALT 19 12/14/2011   AST 19 12/14/2011   NA 143 12/14/2011  K 4.3 12/14/2011   CL 107 12/14/2011   CREATININE 0.7 12/14/2011   BUN 15 12/14/2011   CO2 27 12/14/2011   TSH 1.23 12/14/2011   INR 1.4 01/25/2010   ECG: NSR @ 72 bpm - no ischemic changes      Assessment & Plan:  CPX/v70.0 - Patient has been counseled on age-appropriate routine health concerns for screening and prevention. These are reviewed and up-to-date. Immunizations are up-to-date or declined. Labs and ECG reviewed.

## 2011-12-18 NOTE — Assessment & Plan Note (Signed)
B hip bursitis and hands with OA changes - Does not take NSAIDs per cards due to anticoag on Pradaxa Significant but temporary relief with pred taper by ortho S/p Nsurg eval 11/2011 - DDD not cause of leg pain Advised to see rheum - will arrange same Consider celebrex... Pt will review with rheum and cards Continue tylenol, no relief with tramadol and declines narcotics

## 2011-12-18 NOTE — Assessment & Plan Note (Signed)
On statin - at goal The current medical regimen is effective;  continue present plan and medications.  

## 2011-12-18 NOTE — Patient Instructions (Addendum)
It was good to see you today. We have reviewed your prior records including labs and tests today Health Maintenance reviewed - all recommended immunizations and age-appropriate screenings are up-to-date.  Colonoscopy as discussed - call us if referral needed Medications reviewed, no changes at this time. Refill on medication(s) as discussed today. we'll make referral to Dr Dareen Piano for rheumatology. Our office will contact you regarding appointment(s) once made.  Consider Celebrex for pain if no other abnormality identified by rheumatology evalution Please schedule followup 1 year for annual follow up and labs, call sooner if problems.

## 2011-12-18 NOTE — Assessment & Plan Note (Signed)
PAF - on Pradaxa Follows with cards for same The current medical regimen is effective;  continue present plan and medications.   

## 2012-01-29 ENCOUNTER — Other Ambulatory Visit: Payer: Self-pay | Admitting: *Deleted

## 2012-01-29 MED ORDER — DILTIAZEM HCL ER COATED BEADS 240 MG PO CP24: 240.0000 mg | ORAL_CAPSULE | Freq: Every day | ORAL | Status: DC

## 2012-01-30 ENCOUNTER — Other Ambulatory Visit: Payer: Self-pay

## 2012-01-30 MED ORDER — DILTIAZEM HCL ER COATED BEADS 240 MG PO CP24: 240.0000 mg | ORAL_CAPSULE | Freq: Every day | ORAL | Status: DC

## 2012-01-30 MED ORDER — DABIGATRAN ETEXILATE MESYLATE 150 MG PO CAPS: 150.0000 mg | ORAL_CAPSULE | Freq: Two times a day (BID) | ORAL | Status: DC

## 2012-02-26 ENCOUNTER — Other Ambulatory Visit: Payer: Self-pay | Admitting: Internal Medicine

## 2012-05-15 ENCOUNTER — Ambulatory Visit (INDEPENDENT_AMBULATORY_CARE_PROVIDER_SITE_OTHER): Payer: BC Managed Care – PPO | Admitting: *Deleted

## 2012-05-15 DIAGNOSIS — Z23 Encounter for immunization: Secondary | ICD-10-CM

## 2012-07-03 ENCOUNTER — Other Ambulatory Visit: Payer: Self-pay

## 2012-07-03 MED ORDER — HYDROCHLOROTHIAZIDE 25 MG PO TABS: 25.0000 mg | ORAL_TABLET | Freq: Every day | ORAL | Status: DC

## 2012-07-08 DIAGNOSIS — M84376A Stress fracture, unspecified foot, initial encounter for fracture: Secondary | ICD-10-CM | POA: Diagnosis not present

## 2012-07-16 DIAGNOSIS — I8310 Varicose veins of unspecified lower extremity with inflammation: Secondary | ICD-10-CM | POA: Diagnosis not present

## 2012-07-26 ENCOUNTER — Other Ambulatory Visit (HOSPITAL_COMMUNITY): Payer: Self-pay | Admitting: Obstetrics & Gynecology

## 2012-07-26 DIAGNOSIS — Z1231 Encounter for screening mammogram for malignant neoplasm of breast: Secondary | ICD-10-CM

## 2012-08-12 ENCOUNTER — Ambulatory Visit: Payer: Medicare Other

## 2012-08-12 ENCOUNTER — Ambulatory Visit (INDEPENDENT_AMBULATORY_CARE_PROVIDER_SITE_OTHER): Payer: Medicare Other | Admitting: Emergency Medicine

## 2012-08-12 VITALS — BP 135/84 | HR 121 | Temp 97.8°F | Resp 16 | Ht 65.0 in | Wt 177.0 lb

## 2012-08-12 DIAGNOSIS — S61319A Laceration without foreign body of unspecified finger with damage to nail, initial encounter: Secondary | ICD-10-CM

## 2012-08-12 DIAGNOSIS — S61209A Unspecified open wound of unspecified finger without damage to nail, initial encounter: Secondary | ICD-10-CM | POA: Diagnosis not present

## 2012-08-12 DIAGNOSIS — Z23 Encounter for immunization: Secondary | ICD-10-CM

## 2012-08-12 DIAGNOSIS — S6710XA Crushing injury of unspecified finger(s), initial encounter: Secondary | ICD-10-CM

## 2012-08-12 DIAGNOSIS — M25549 Pain in joints of unspecified hand: Secondary | ICD-10-CM | POA: Diagnosis not present

## 2012-08-12 NOTE — Progress Notes (Signed)
[facility] [address], [place] [address] [phone]  Date:  08/12/2012   Name:  LACRISHA BIELICKI   DOB:  1946-03-06   MRN:  [id number]  PCP:  Rene Paci, MD    Chief Complaint: Hand Pain   History of Present Illness:  [name] is a 67 y.o. very pleasant female patient who presents with the following:  Crush injury to right fifth finger when caught in car door this afternoon.  Has a laceration through her nail.  Denies other complaints.   Last tetanus 2007  Patient Active Problem List  Diagnosis  . MIXED HYPERLIPIDEMIA  . HYPERTENSION  . MITRAL VALVE PROLAPSE  . ATRIAL FIBRILLATION  . ARTHRITIS, HIPS, BILATERAL  . COCCYGEAL PAIN  . BURSITIS, SUBTROCHANTERIC  . Primary generalized (osteo)arthritis    Past Medical History  Diagnosis Date  . Mixed hyperlipidemia   . MITRAL VALVE PROLAPSE   . HYPERTENSION   . Atrial fibrillation     chronic anticoag - Pradaxa  . BURSITIS, SUBTROCHANTERIC   . ARTHRITIS, HIPS, BILATERAL     Past Surgical History  Procedure Date  . Tonsillectomy   . Dilation and curettage of uterus     History  Substance Use Topics  . Smoking status: Former Games developer  . Smokeless tobacco: Not on file  . Alcohol Use: Not on file    Family History  Problem Relation Age of Onset  . Leukemia Father   . Hypertension Mother   . Heart failure Mother     No Known Allergies  Medication list has been reviewed and updated.  Current Outpatient Prescriptions on File Prior to Visit  Medication Sig Dispense Refill  . dabigatran (PRADAXA) 150 MG CAPS Take 1 capsule (150 mg total) by mouth every 12 (twelve) hours.  180 capsule  3  . diltiazem (CARDIZEM CD) 240 MG 24 hr capsule Take 1 capsule (240 mg total) by mouth daily.  90 capsule  4  . hydrochlorothiazide (HYDRODIURIL) 25 MG tablet Take 1 tablet (25 mg total) by mouth daily.  90 tablet  1  . lisinopril (PRINIVIL,ZESTRIL) 40 MG tablet TAKE 1 TABLET DAILY  90  tablet  2  . simvastatin (ZOCOR) 20 MG tablet Take 1 tablet (20 mg total) by mouth daily.  90 tablet  3  . traMADol (ULTRAM) 50 MG tablet prn        Review of Systems:  As per HPI, otherwise negative.    Physical Examination: Filed Vitals:   08/12/12 1439  BP: 135/84  Pulse: 121  Temp: 97.8 F (36.6 C)  Resp: 16   Filed Vitals:   08/12/12 1439  Height: 5\' 5"  (1.651 m)  Weight: 177 lb (80.287 kg)   Body mass index is 29.45 kg/(m^2). Ideal Body Weight: Weight in (lb) to have BMI = 25: 149.9    GEN: WDWN, NAD, Non-toxic, Alert & Oriented x 3 HEENT: Atraumatic, Normocephalic.  Ears and Nose: No external deformity. EXTR: No clubbing/cyanosis/edema NEURO: Normal gait.  PSYCH: Normally interactive. Conversant. Not depressed or anxious appearing.  Calm demeanor.  Finger:  Laceration across the nail of her right fifth finger.  Nail split. NATI   No fb  Assessment and Plan: Crush injury of the fifth finger Nail bed laceration  [facility] reading (PRIMARY) by  Dr. Dareen Piano.  No fracture.    Carmelina Dane, MD

## 2012-08-12 NOTE — Progress Notes (Signed)
Procedure Note: Verbal consent obtained from the patient.  Metacarpal block with 3 cc 1:1 ratio Marcaine 0.5% and  Lidocaine 2% without epinephrine.  Distal end of the broken nail lifted and removed without difficulty.  Proximal aspect of the nail intact and without hematoma, so this was left in place.  Small laceration distal and perpendicular to the intact nail.  Nail bed wound closed with 2 simple interrupted sutures of 5-0 Vicryl.  Area cleansed and dressed with xeroform gauze.  Patient tolerated the procedure well.

## 2012-08-12 NOTE — Patient Instructions (Signed)
Keep the dressing intact for the next 24 hours.  You may keep the Xeroform gauze in place for the next several days.  After that, it is ok to wash the area with warm soap and water.  Make sure you dry the area well.  The sutures do not need to be removed, but if they become bothersome to you, we can trim the knots after 7-10 days.  If you notice any signs of infection (redness, swelling, drainage, fever, etc) come back in.

## 2012-08-13 ENCOUNTER — Telehealth: Payer: Self-pay | Admitting: Internal Medicine

## 2012-08-13 MED ORDER — DILTIAZEM HCL ER COATED BEADS 240 MG PO CP24: 240.0000 mg | ORAL_CAPSULE | Freq: Every day | ORAL | Status: DC

## 2012-08-13 MED ORDER — DABIGATRAN ETEXILATE MESYLATE 150 MG PO CAPS: 150.0000 mg | ORAL_CAPSULE | Freq: Two times a day (BID) | ORAL | Status: DC

## 2012-08-13 NOTE — Telephone Encounter (Signed)
New problem:   Start on new medication plan as of jan 1st.    [facility]  Fax # [phone].   praxada  150 mg .  Diltiazem 240 mg

## 2012-08-15 ENCOUNTER — Ambulatory Visit (HOSPITAL_COMMUNITY)
Admission: RE | Admit: 2012-08-15 | Discharge: 2012-08-15 | Disposition: A | Discharge status: Home / Self Care | Payer: Medicare Other | Source: Ambulatory Visit | Attending: Obstetrics & Gynecology | Admitting: Obstetrics & Gynecology

## 2012-08-15 DIAGNOSIS — M76899 Other specified enthesopathies of unspecified lower limb, excluding foot: Secondary | ICD-10-CM | POA: Diagnosis not present

## 2012-08-15 DIAGNOSIS — Z1231 Encounter for screening mammogram for malignant neoplasm of breast: Secondary | ICD-10-CM | POA: Diagnosis not present

## 2012-08-16 ENCOUNTER — Ambulatory Visit: Payer: BC Managed Care – PPO | Admitting: Internal Medicine

## 2012-08-19 ENCOUNTER — Other Ambulatory Visit: Payer: Self-pay | Admitting: *Deleted

## 2012-08-19 MED ORDER — HYDROCHLOROTHIAZIDE 25 MG PO TABS: 25.0000 mg | ORAL_TABLET | Freq: Every day | ORAL | Status: DC

## 2012-08-19 MED ORDER — LISINOPRIL 40 MG PO TABS: 40.0000 mg | ORAL_TABLET | Freq: Every day | ORAL | Status: DC

## 2012-08-19 MED ORDER — SIMVASTATIN 20 MG PO TABS: 20.0000 mg | ORAL_TABLET | Freq: Every day | ORAL | Status: DC

## 2012-08-19 NOTE — Telephone Encounter (Signed)
Left msg on vm stating insurance has change and needing new rx [facility]. Acct# [id number]. Called pt back inform her sent rx to mail service.Marland Kitchenlmb

## 2012-08-23 ENCOUNTER — Telehealth: Payer: Self-pay | Admitting: Internal Medicine

## 2012-08-23 ENCOUNTER — Ambulatory Visit (INDEPENDENT_AMBULATORY_CARE_PROVIDER_SITE_OTHER): Payer: Medicare Other | Admitting: Internal Medicine

## 2012-08-23 ENCOUNTER — Encounter: Payer: Self-pay | Admitting: Internal Medicine

## 2012-08-23 VITALS — BP 119/69 | HR 91 | Ht 64.0 in | Wt 177.2 lb

## 2012-08-23 DIAGNOSIS — E782 Mixed hyperlipidemia: Secondary | ICD-10-CM | POA: Diagnosis not present

## 2012-08-23 DIAGNOSIS — I4891 Unspecified atrial fibrillation: Secondary | ICD-10-CM

## 2012-08-23 DIAGNOSIS — I10 Essential (primary) hypertension: Secondary | ICD-10-CM | POA: Diagnosis not present

## 2012-08-23 MED ORDER — DILTIAZEM HCL 30 MG PO TABS: ORAL_TABLET | ORAL | Status: DC

## 2012-08-23 MED ORDER — DILTIAZEM HCL ER COATED BEADS 240 MG PO CP24: 240.0000 mg | ORAL_CAPSULE | Freq: Every day | ORAL | Status: DC

## 2012-08-23 NOTE — Assessment & Plan Note (Signed)
Stable No change required today  

## 2012-08-23 NOTE — Progress Notes (Signed)
PCP: Rene Paci, MD  [name] is a 67 y.o. female who presents today for routine electrophysiology followup.  Since last being seen in our clinic, the patient reports doing very well.  Her primary concern is with bilateral hip pain and this has improved with recent injections.  She reports that her exercise tolerance is preserved.  She denies fatigue. She is unaware of her afib.  Today, she denies symptoms of palpitations, chest pain, shortness of breath,  lower extremity edema, dizziness, presyncope, or syncope.  The patient is otherwise without complaint today.   Past Medical History  Diagnosis Date  . Mixed hyperlipidemia   . MITRAL VALVE PROLAPSE   . HYPERTENSION   . Atrial fibrillation     chronic anticoag - Pradaxa  . BURSITIS, SUBTROCHANTERIC   . ARTHRITIS, HIPS, BILATERAL    Past Surgical History  Procedure Date  . Tonsillectomy   . Dilation and curettage of uterus     Current Outpatient Prescriptions  Medication Sig Dispense Refill  . dabigatran (PRADAXA) 150 MG CAPS Take 1 capsule (150 mg total) by mouth every 12 (twelve) hours.  180 capsule  0  . diltiazem (CARDIZEM CD) 240 MG 24 hr capsule Take 240 mg by mouth daily as needed.      . hydrochlorothiazide (HYDRODIURIL) 25 MG tablet Take 1 tablet (25 mg total) by mouth daily.  90 tablet  1  . HYDROcodone-acetaminophen (NORCO/VICODIN) 5-325 MG per tablet       . lisinopril (PRINIVIL,ZESTRIL) 40 MG tablet Take 1 tablet (40 mg total) by mouth daily.  90 tablet  1  . simvastatin (ZOCOR) 20 MG tablet Take 1 tablet (20 mg total) by mouth daily.  90 tablet  1  . traMADol (ULTRAM) 50 MG tablet prn        Physical Exam: Filed Vitals:   08/23/12 0848  BP: 119/69  Pulse: 91  Height: 5\' 4"  (1.626 m)  Weight: 177 lb 3.2 oz (80.377 kg)    GEN- The patient is well appearing, alert and oriented x 3 today.   Head- normocephalic, atraumatic Eyes-  Sclera clear, conjunctiva pink Ears- hearing intact Oropharynx-  clear Lungs- Clear to ausculation bilaterally, normal work of breathing Heart- irregular rate and rhythm, no murmurs, rubs or gallops, PMI not laterally displaced GI- soft, NT, ND, + BS Extremities- no clubbing, cyanosis, or edema  ekg today reveals afib, V rate 90s, nonspecifc St/T changes  Assessment and Plan:

## 2012-08-23 NOTE — Patient Instructions (Addendum)
Your physician recommends that you schedule a follow-up appointment in: 3 months with Dr Johney Frame  Your physician has requested that you have an echocardiogram. Echocardiography is a painless test that uses sound waves to create images of your heart. It provides your doctor with information about the size and shape of your heart and how well your heart's chambers and valves are working. This procedure takes approximately one hour. There are no restrictions for this procedure.   Get an EKG the same day as echo  Your physician has recommended you make the following change in your medication:  1) Start taking the Diltiazem daily

## 2012-08-23 NOTE — Telephone Encounter (Signed)
Was sent to [facility] but cx and now sent to local pharmacy

## 2012-08-23 NOTE — Assessment & Plan Note (Addendum)
She presents today with afib but is unaware of her afib.  I am not certain how long she has been in afib.  She was in sinus when she saw Dr Felicity Coyer in May.  Her heart rates are controlled and she is anticoagulated with pradaxa. I will obtain an echo.  She will return at that time for an ekg also.  If she develops symptoms with her afib then we will pursue cardioversion/ AAD otherwise, she would prefer rate control. She will keep track of her heart rates and return in 3 months for follow-up.

## 2012-08-23 NOTE — Telephone Encounter (Signed)
ditiazem was not called in today as discussed at visit today,  [facility] [employer]

## 2012-08-26 ENCOUNTER — Telehealth: Payer: Self-pay | Admitting: Internal Medicine

## 2012-08-26 ENCOUNTER — Other Ambulatory Visit: Payer: Self-pay | Admitting: *Deleted

## 2012-08-26 ENCOUNTER — Other Ambulatory Visit (HOSPITAL_COMMUNITY): Payer: Self-pay | Admitting: Internal Medicine

## 2012-08-26 DIAGNOSIS — I4891 Unspecified atrial fibrillation: Secondary | ICD-10-CM

## 2012-08-26 MED ORDER — PRAVASTATIN SODIUM 20 MG PO TABS: 20.0000 mg | ORAL_TABLET | Freq: Every day | ORAL | Status: DC

## 2012-08-26 MED ORDER — DILTIAZEM HCL 30 MG PO TABS: ORAL_TABLET | ORAL | Status: DC

## 2012-08-26 NOTE — Telephone Encounter (Signed)
[facility]  NOTIFIED OF CHANGE IN MEDICATION TO PRAVASTATIN 20MG . Rx SENT IN PER DR. Felicity Coyer TO [facility]/

## 2012-08-26 NOTE — Telephone Encounter (Signed)
Pt's pradaxa needs prior approval, [phone] [facility], pls call pt when done [phone]

## 2012-08-26 NOTE — Telephone Encounter (Signed)
Patient aware PA done

## 2012-08-26 NOTE — Telephone Encounter (Signed)
Call from [place] at [employer].  Holding meds for [facility] waiting on reply from Dr. Felicity Coyer.  Meds prescribed by Dr. Johney Frame contraindicated with Simvastatin.  For this combination, FDA suggests Simvastatin 10 mg or less.  Please call [facility], reference number [id number].

## 2012-08-26 NOTE — Telephone Encounter (Signed)
Will change simva to pravastatin 20mg  for cholesterol mgmt due to potential drug interaction risk of simva - new erx prava done - please let pt/[facility] know same

## 2012-08-28 ENCOUNTER — Telehealth: Payer: Self-pay | Admitting: Internal Medicine

## 2012-08-28 DIAGNOSIS — I4891 Unspecified atrial fibrillation: Secondary | ICD-10-CM

## 2012-08-28 NOTE — Telephone Encounter (Signed)
Pharmacy needs to clarify dosage for diltiazem 30 mg

## 2012-08-28 NOTE — Telephone Encounter (Signed)
FYI  Received approval from [facility] for pradaxa 150.000 mg caps, use as directed.  Approved through 08/26/2013 under medicare part d benefit.

## 2012-08-29 ENCOUNTER — Ambulatory Visit (INDEPENDENT_AMBULATORY_CARE_PROVIDER_SITE_OTHER): Payer: Medicare Other | Admitting: *Deleted

## 2012-08-29 ENCOUNTER — Ambulatory Visit (HOSPITAL_COMMUNITY): Payer: Medicare Other | Attending: Internal Medicine | Admitting: Radiology

## 2012-08-29 DIAGNOSIS — I369 Nonrheumatic tricuspid valve disorder, unspecified: Secondary | ICD-10-CM | POA: Insufficient documentation

## 2012-08-29 DIAGNOSIS — I059 Rheumatic mitral valve disease, unspecified: Secondary | ICD-10-CM | POA: Diagnosis not present

## 2012-08-29 DIAGNOSIS — I10 Essential (primary) hypertension: Secondary | ICD-10-CM | POA: Diagnosis not present

## 2012-08-29 DIAGNOSIS — I4891 Unspecified atrial fibrillation: Secondary | ICD-10-CM

## 2012-08-29 MED ORDER — DILTIAZEM HCL 30 MG PO TABS: ORAL_TABLET | ORAL | Status: DC

## 2012-08-29 NOTE — Progress Notes (Signed)
ekg done/ afib rate 100, ekg given to Dr Jenel Lucks nurse.

## 2012-08-29 NOTE — Progress Notes (Signed)
Echocardiogram performed.  

## 2012-09-02 ENCOUNTER — Telehealth: Payer: Self-pay | Admitting: Internal Medicine

## 2012-09-02 NOTE — Telephone Encounter (Signed)
Spoke with patient.  She is aware of her echo results and will follow up as scheduled in 3 months.  She has been having some pain that she says she has had off and on for a while.  Sounds like it could be reflux.  I will have her get OTC Prilosec or Zantac and call me back if this does not help

## 2012-09-02 NOTE — Telephone Encounter (Signed)
Pt would like to know is she getting a follow up visit after echo since she is still in a-fib

## 2012-09-17 ENCOUNTER — Telehealth: Payer: Self-pay | Admitting: *Deleted

## 2012-09-17 ENCOUNTER — Other Ambulatory Visit: Payer: Self-pay | Admitting: Internal Medicine

## 2012-09-17 MED ORDER — ONDANSETRON 4 MG PO TBDP: 4.0000 mg | ORAL_TABLET | Freq: Three times a day (TID) | ORAL | Status: DC | PRN

## 2012-09-17 NOTE — Telephone Encounter (Signed)
Notified pt with Regina response...lmb 

## 2012-09-17 NOTE — Telephone Encounter (Signed)
Left msg on vm stating having flu like sxs. Having some diarrhea & nausea wanting to see will md rx something for the nausea...[name]

## 2012-09-17 NOTE — Telephone Encounter (Signed)
[name], I will send her some zofran, but I do recommend she be evaluated at the office to see exactly what is going on with her. Rene Kocher

## 2012-09-23 ENCOUNTER — Telehealth: Payer: Self-pay | Admitting: Internal Medicine

## 2012-09-23 NOTE — Telephone Encounter (Signed)
Spoke with patient and let her know I had discussed with Dr Johney Frame.  He feels as this all could be related to the most recent GI bug she had for 3 days.  She is going to continue to hold off on her Lisinopril and HCTZ and keep monitoring her BP.  She will call us back towards the end of the week if she does not feel she is getting any better

## 2012-09-23 NOTE — Telephone Encounter (Signed)
New Problem    Pt feels she is in afib again and hasnt felt good in the past couple of days. Did not want to go to ER, wanted to check with nurse first.

## 2012-09-23 NOTE — Telephone Encounter (Signed)
Follow up call   Please call cell phone

## 2012-10-28 DIAGNOSIS — M67919 Unspecified disorder of synovium and tendon, unspecified shoulder: Secondary | ICD-10-CM | POA: Diagnosis not present

## 2012-10-28 DIAGNOSIS — M19049 Primary osteoarthritis, unspecified hand: Secondary | ICD-10-CM | POA: Diagnosis not present

## 2012-10-28 DIAGNOSIS — M719 Bursopathy, unspecified: Secondary | ICD-10-CM | POA: Diagnosis not present

## 2012-10-30 DIAGNOSIS — M76899 Other specified enthesopathies of unspecified lower limb, excluding foot: Secondary | ICD-10-CM | POA: Diagnosis not present

## 2012-11-12 DIAGNOSIS — D2310 Other benign neoplasm of skin of unspecified eyelid, including canthus: Secondary | ICD-10-CM | POA: Diagnosis not present

## 2012-11-12 DIAGNOSIS — H2640 Unspecified secondary cataract: Secondary | ICD-10-CM | POA: Diagnosis not present

## 2012-11-12 DIAGNOSIS — H52209 Unspecified astigmatism, unspecified eye: Secondary | ICD-10-CM | POA: Diagnosis not present

## 2012-11-12 DIAGNOSIS — H2510 Age-related nuclear cataract, unspecified eye: Secondary | ICD-10-CM | POA: Diagnosis not present

## 2012-11-21 DIAGNOSIS — H26499 Other secondary cataract, unspecified eye: Secondary | ICD-10-CM | POA: Diagnosis not present

## 2012-11-21 DIAGNOSIS — H2640 Unspecified secondary cataract: Secondary | ICD-10-CM | POA: Diagnosis not present

## 2012-11-26 DIAGNOSIS — D239 Other benign neoplasm of skin, unspecified: Secondary | ICD-10-CM | POA: Diagnosis not present

## 2012-11-26 DIAGNOSIS — D485 Neoplasm of uncertain behavior of skin: Secondary | ICD-10-CM | POA: Diagnosis not present

## 2012-12-04 ENCOUNTER — Ambulatory Visit (INDEPENDENT_AMBULATORY_CARE_PROVIDER_SITE_OTHER): Payer: Medicare Other | Admitting: Internal Medicine

## 2012-12-04 ENCOUNTER — Encounter: Payer: Self-pay | Admitting: Internal Medicine

## 2012-12-04 VITALS — BP 113/65 | HR 78 | Ht 64.0 in | Wt 175.4 lb

## 2012-12-04 DIAGNOSIS — I10 Essential (primary) hypertension: Secondary | ICD-10-CM

## 2012-12-04 DIAGNOSIS — I4891 Unspecified atrial fibrillation: Secondary | ICD-10-CM

## 2012-12-04 DIAGNOSIS — E782 Mixed hyperlipidemia: Secondary | ICD-10-CM | POA: Diagnosis not present

## 2012-12-04 NOTE — Progress Notes (Signed)
PCP: Rene Paci, MD  [name] is a 67 y.o. female who presents today for routine electrophysiology followup.  Since last being seen in our clinic, the patient reports doing very well.   She reports that her exercise tolerance is preserved.  She denies fatigue. She is unaware of her afib.  Today, she denies symptoms of palpitations, chest pain, shortness of breath,  lower extremity edema, dizziness, presyncope, or syncope.  The patient is otherwise without complaint today.   Past Medical History  Diagnosis Date  . Mixed hyperlipidemia   . MITRAL VALVE PROLAPSE   . HYPERTENSION   . Atrial fibrillation     chronic anticoag - Pradaxa  . BURSITIS, SUBTROCHANTERIC   . ARTHRITIS, HIPS, BILATERAL    Past Surgical History  Procedure Laterality Date  . Tonsillectomy    . Dilation and curettage of uterus      Current Outpatient Prescriptions  Medication Sig Dispense Refill  . CELEBREX 200 MG capsule Take 200 mg by mouth daily.       . dabigatran (PRADAXA) 150 MG CAPS Take 1 capsule (150 mg total) by mouth every 12 (twelve) hours.  180 capsule  0  . diltiazem (CARDIZEM CD) 240 MG 24 hr capsule Take 1 capsule (240 mg total) by mouth daily.  90 capsule  3  . diltiazem (CARDIZEM) 30 MG tablet Take prn for afib  30 tablet  1  . hydrochlorothiazide (HYDRODIURIL) 25 MG tablet Take 1 tablet (25 mg total) by mouth daily.  90 tablet  1  . lisinopril (PRINIVIL,ZESTRIL) 40 MG tablet Take 1 tablet (40 mg total) by mouth daily.  90 tablet  1  . pravastatin (PRAVACHOL) 20 MG tablet Take 1 tablet (20 mg total) by mouth daily.  90 tablet  3   No current facility-administered medications for this visit.    Physical Exam: Filed Vitals:   12/04/12 0914  BP: 113/65  Pulse: 78  Height: 5\' 4"  (1.626 m)  Weight: 175 lb 6.4 oz (79.561 kg)    GEN- The patient is well appearing, alert and oriented x 3 today.   Head- normocephalic, atraumatic Eyes-  Sclera clear, conjunctiva pink Ears- hearing  intact Oropharynx- clear Lungs- Clear to ausculation bilaterally, normal work of breathing Heart- irregular rate and rhythm, no murmurs, rubs or gallops, PMI not laterally displaced GI- soft, NT, ND, + BS Extremities- no clubbing, cyanosis, or edema  Echo is reviewed with the patient today  Assessment and Plan:  1. Persistent afib Asymptomatic Stable and adequately anticoagulated HR well controlled  2. HTN Stable No change required today  3. HL Stable No change required today  Return in 8 months

## 2012-12-04 NOTE — Patient Instructions (Addendum)
Your physician wants you to follow-up in: 8 months with Dr Jacquiline Doe will receive a reminder letter in the mail two months in advance. If you don't receive a letter, please call our office to schedule the follow-up appointment.  Your physician recommends that you continue on your current medications as directed. Please refer to the Current Medication list given to you today.

## 2012-12-09 ENCOUNTER — Other Ambulatory Visit: Payer: Self-pay | Admitting: Emergency Medicine

## 2012-12-09 MED ORDER — DABIGATRAN ETEXILATE MESYLATE 150 MG PO CAPS: 150.0000 mg | ORAL_CAPSULE | Freq: Two times a day (BID) | ORAL | Status: DC

## 2012-12-13 ENCOUNTER — Telehealth: Payer: Self-pay

## 2012-12-13 NOTE — Telephone Encounter (Signed)
pt called to ck status of pradaxa refill.refill was escribed on 12/09/12.called [facility] that state the did not receive it.phone in 180 r-3.called pt back pt notified.

## 2012-12-26 DIAGNOSIS — H1030 Unspecified acute conjunctivitis, unspecified eye: Secondary | ICD-10-CM | POA: Diagnosis not present

## 2013-01-07 DIAGNOSIS — N390 Urinary tract infection, site not specified: Secondary | ICD-10-CM | POA: Diagnosis not present

## 2013-01-07 DIAGNOSIS — Z124 Encounter for screening for malignant neoplasm of cervix: Secondary | ICD-10-CM | POA: Diagnosis not present

## 2013-01-30 ENCOUNTER — Telehealth: Payer: Self-pay | Admitting: Internal Medicine

## 2013-01-30 NOTE — Telephone Encounter (Signed)
New Problem  Lacy @ Dr Kinnie Scales office states they need written clearance for pt to stop Pradaxa.  She is having a colonoscopy on July 10. She said you can fax a letter to [phone]

## 2013-01-30 NOTE — Telephone Encounter (Signed)
Hold 36 hours prior and 24 hours post  [name] in MR will fax

## 2013-02-13 DIAGNOSIS — Z7901 Long term (current) use of anticoagulants: Secondary | ICD-10-CM | POA: Diagnosis not present

## 2013-02-13 DIAGNOSIS — D126 Benign neoplasm of colon, unspecified: Secondary | ICD-10-CM | POA: Diagnosis not present

## 2013-02-13 DIAGNOSIS — Z5181 Encounter for therapeutic drug level monitoring: Secondary | ICD-10-CM | POA: Diagnosis not present

## 2013-02-13 DIAGNOSIS — R197 Diarrhea, unspecified: Secondary | ICD-10-CM | POA: Diagnosis not present

## 2013-02-13 DIAGNOSIS — Z1211 Encounter for screening for malignant neoplasm of colon: Secondary | ICD-10-CM | POA: Diagnosis not present

## 2013-02-13 DIAGNOSIS — K5730 Diverticulosis of large intestine without perforation or abscess without bleeding: Secondary | ICD-10-CM | POA: Diagnosis not present

## 2013-02-28 ENCOUNTER — Other Ambulatory Visit: Payer: Self-pay | Admitting: Internal Medicine

## 2013-03-04 ENCOUNTER — Ambulatory Visit (INDEPENDENT_AMBULATORY_CARE_PROVIDER_SITE_OTHER): Payer: Medicare Other | Admitting: Sports Medicine

## 2013-03-04 VITALS — BP 103/68 | Ht 64.0 in | Wt 175.0 lb

## 2013-03-04 DIAGNOSIS — M1610 Unilateral primary osteoarthritis, unspecified hip: Secondary | ICD-10-CM | POA: Diagnosis not present

## 2013-03-04 DIAGNOSIS — M76899 Other specified enthesopathies of unspecified lower limb, excluding foot: Secondary | ICD-10-CM | POA: Diagnosis not present

## 2013-03-04 DIAGNOSIS — M159 Polyosteoarthritis, unspecified: Secondary | ICD-10-CM | POA: Diagnosis not present

## 2013-03-04 DIAGNOSIS — M150 Primary generalized (osteo)arthritis: Secondary | ICD-10-CM

## 2013-03-04 MED ORDER — TRAMADOL HCL 50 MG PO TABS: 50.0000 mg | ORAL_TABLET | Freq: Four times a day (QID) | ORAL | Status: DC | PRN

## 2013-03-04 NOTE — Assessment & Plan Note (Signed)
This is significant and will continue to recur 2/2 profound hip MM weakness  She needs a good strength program for all hip mm  Given HEP  We will want to push this steadily over next 6 wks  This should help a great deal if we can get enough strength to stabilize gait and take pressure off bursa and joints

## 2013-03-04 NOTE — Assessment & Plan Note (Signed)
This is severe  She needs a trial of tramadol to control pain  Trial of Capzin  Reck p 6 weeks

## 2013-03-04 NOTE — Patient Instructions (Addendum)
Do Hip exercises daily and if possible a few times a day  Hands and feet try capsaician cream - Capzin - uses 3 to 4 times over sore joints  Warm water soaks may help you keep motion in hands and feet  Let's use tramadol 50 mgm up to 4 x a day for pain relief Skip night dose if it keeps you awake Watch for constipation  See me in 6 weeks

## 2013-03-04 NOTE — Assessment & Plan Note (Signed)
I reviewed Xrays  I think this is mild and not the source of most of her pain

## 2013-03-04 NOTE — Progress Notes (Signed)
Patient ID: [name], female   DOB: Aug 29, 1945, 67 y.o.   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 Mod of hip rotator RT hip marked weakness of abduction and of glut Max, mild weakness of hip rotator  Hands Bilat severe DJD changes at PIPs and DIPS Left cannot get 4th finger closed on grip Rt only 2nd closes on grip Rt 3rd PIP with very limited flexion  Foot Bilat early hallux valgus bilat hallux rigidus Left distal DIP hammer on 4 left IR of left 3rd toe Lat column is broken down bilat

## 2013-03-31 ENCOUNTER — Other Ambulatory Visit: Payer: Self-pay

## 2013-03-31 MED ORDER — DABIGATRAN ETEXILATE MESYLATE 150 MG PO CAPS: 150.0000 mg | ORAL_CAPSULE | Freq: Two times a day (BID) | ORAL | Status: DC

## 2013-04-11 DIAGNOSIS — R197 Diarrhea, unspecified: Secondary | ICD-10-CM | POA: Diagnosis not present

## 2013-04-20 ENCOUNTER — Other Ambulatory Visit: Payer: Self-pay | Admitting: Internal Medicine

## 2013-04-24 ENCOUNTER — Ambulatory Visit (INDEPENDENT_AMBULATORY_CARE_PROVIDER_SITE_OTHER): Payer: Medicare Other | Admitting: Sports Medicine

## 2013-04-24 VITALS — BP 96/63 | Ht 64.0 in | Wt 166.0 lb

## 2013-04-24 DIAGNOSIS — M76899 Other specified enthesopathies of unspecified lower limb, excluding foot: Secondary | ICD-10-CM

## 2013-04-24 DIAGNOSIS — M159 Polyosteoarthritis, unspecified: Secondary | ICD-10-CM | POA: Diagnosis not present

## 2013-04-24 DIAGNOSIS — M25551 Pain in right hip: Secondary | ICD-10-CM

## 2013-04-24 DIAGNOSIS — M1610 Unilateral primary osteoarthritis, unspecified hip: Secondary | ICD-10-CM | POA: Diagnosis not present

## 2013-04-24 DIAGNOSIS — M25559 Pain in unspecified hip: Secondary | ICD-10-CM | POA: Diagnosis not present

## 2013-04-24 MED ORDER — METHYLPREDNISOLONE ACETATE 40 MG/ML IJ SUSP
40.0000 mg | Freq: Once | INTRAMUSCULAR | Status: AC
  Administered 2013-04-24: 40 mg via INTRA_ARTICULAR

## 2013-04-24 NOTE — Assessment & Plan Note (Signed)
She continues to make progress with gaining strength  Given an injection of the left greater trochanteric bursa today   she will rest this for 3 days and then restart the exercise program  Continued tramadol for other pain

## 2013-04-24 NOTE — Patient Instructions (Addendum)
We injected your left trochanteric bursa today. Tonight and tomorrow, ice the area for 15-20 minutes. Do not start your exercises again until three days from now, so that the medicine has time to work. (You can start them again on Sunday, 9/21).  Remember to really focus on your hip ABDUCTION. Try to slowly build up to 3 sets of 15, and if you can do that, you can add on a shoe to put some weight on it, or you can get a 2 lb ankle weight.   Next time you come, bring your shoes, and we can put in some orthotics for your feet.   Hip Bursitis Bursitis is a swelling and soreness (inflammation) of a fluid-filled sac (bursa). This sac overlies and protects the joints.  CAUSES   Injury.  Overuse of the muscles surrounding the joint.  Arthritis.  Gout.  Infection.  Cold weather.  Inadequate warm-up and conditioning prior to activities. The cause may not be known.  SYMPTOMS   Mild to severe irritation.  Tenderness and swelling over the outside of the hip.  Pain with motion of the hip.  If the bursa becomes infected, a fever may be present. Redness, tenderness, and warmth will develop over the hip. Symptoms usually lessen in 3 to 4 weeks with treatment, but can come back. TREATMENT If conservative treatment does not work, your caregiver may advise draining the bursa and injecting cortisone into the area. This may speed up the healing process. This may also be used as an initial treatment of choice. HOME CARE INSTRUCTIONS   Apply ice to the affected area for 15-20 minutes every 3 to 4 hours while awake for the first 2 days. Put the ice in a plastic bag and place a towel between the bag of ice and your skin.  Rest the painful joint as much as possible, but continue to put the joint through a normal range of motion at least 4 times per day. When the pain lessens, begin normal, slow movements and usual activities to help prevent stiffness of the hip.  Only take over-the-counter or  prescription medicines for pain, discomfort, or fever as directed by your caregiver.  Use crutches to limit weight bearing on the hip joint, if advised.  Elevate your painful hip to reduce swelling. Use pillows for propping and cushioning your legs and hips.  Gentle massage may provide comfort and decrease swelling. SEEK IMMEDIATE MEDICAL CARE IF:   Your pain increases even during treatment, or you are not improving.  You have a fever.  You have heat and inflammation over the involved bursa.  You have any other questions or concerns. MAKE SURE YOU:   Understand these instructions.  Will watch your condition.  Will get help right away if you are not doing well or get worse. Document Released: 01/13/2002 Document Revised: 10/16/2011 Document Reviewed: 08/12/2008 [facility] Patient Information 2014 [place], [place].

## 2013-04-24 NOTE — Progress Notes (Signed)
  Subjective:    Patient ID: [name], female    DOB: 02/08/1946, 67 y.o.   MRN: [id number]  HPI [name] is a 67 year old female who comes in for follow-up of bilateral hip pain. She states that she has been doing her exercises that she was given last time she was here, and she has improved in her ability to do them, and has gone from barely being able to do 2 sets of 6 to doing 3 sets of 10. However, her pain has not gotten much better. She is having a hard time walking upstairs. Her pain is on the outside of her hips, and the left side is worse than the right. When she is sitting, she has no pain. She has been taking tramadol TID, which helps ease off the pain. She has no numbness or tingling down her legs. She cannot sleep on her left hip, and if she rolls onto it, it wakes her up from sleep.    Review of Systems     Objective:   Physical Exam  Constitutional: She is oriented to person, place, and time. She appears well-developed and well-nourished.  HENT:  Head: Normocephalic and atraumatic.  Eyes: Conjunctivae are normal.  Pulmonary/Chest: Effort normal.  Neurological: She is alert and oriented to person, place, and time.  Psychiatric: She has a normal mood and affect. Her behavior is normal.   Hip: Tenderness to palpation in bilateral trochanteric bursa region. Mild tenderness in left piriformis/gluteus region. Normal bilateral hip ROM. 5-/5 strength with right hip abduction, 4/5 strength with left hip abduction, 5-/5 left hip flexion, adduction.   Bilateral early hallux valgus and hallux rigidus  Left distal DIP hammer on 4 left  IR of left 3rd toe  Lat column is broken down bilaterally, L > R       Assessment & Plan:  67 year old female with bilateral trochanteric bursitis, L > R  - Continue to work on hip strengthening exercises, particularly abduction - Inject left trochanteric bursa today - Bring shoes at next visit for orthotic placement   Procedure:   Injection of left trochanteric bursa Consent obtained and verified. Time-out conducted. Noted no overlying erythema, induration, or other signs of local infection. Skin prepped in a sterile fashion. Topical analgesic spray: Ethyl chloride. Completed without difficulty. Meds: 5 cc 1% lidocaine with 1 cc (40 mg) depomedrol Pain immediately improved suggesting accurate placement of the medication. Advised to call if fevers/chills, erythema, induration, drainage, or persistent bleeding.

## 2013-05-26 ENCOUNTER — Encounter: Payer: Medicare Other | Admitting: Internal Medicine

## 2013-05-29 ENCOUNTER — Ambulatory Visit: Payer: Medicare Other | Admitting: Sports Medicine

## 2013-06-04 ENCOUNTER — Encounter: Payer: Self-pay | Admitting: Sports Medicine

## 2013-06-04 ENCOUNTER — Ambulatory Visit (INDEPENDENT_AMBULATORY_CARE_PROVIDER_SITE_OTHER): Payer: Medicare Other | Admitting: Sports Medicine

## 2013-06-04 VITALS — BP 104/71 | HR 87 | Ht 64.0 in | Wt 166.0 lb

## 2013-06-04 DIAGNOSIS — M1610 Unilateral primary osteoarthritis, unspecified hip: Secondary | ICD-10-CM | POA: Diagnosis not present

## 2013-06-04 DIAGNOSIS — M25559 Pain in unspecified hip: Secondary | ICD-10-CM

## 2013-06-04 DIAGNOSIS — G8929 Other chronic pain: Secondary | ICD-10-CM

## 2013-06-04 DIAGNOSIS — M76899 Other specified enthesopathies of unspecified lower limb, excluding foot: Secondary | ICD-10-CM | POA: Diagnosis not present

## 2013-06-04 DIAGNOSIS — M159 Polyosteoarthritis, unspecified: Secondary | ICD-10-CM | POA: Diagnosis not present

## 2013-06-04 NOTE — Patient Instructions (Signed)
Thank you for coming in today  Please resume your hip exercises - standing hip rotation, hip abduction, straight leg raise. Would recommend trial off of statin for the next couple months to see if this improves your symptoms Check vitamin D and CPK level Start vitamin D 1000-2000 units daily Build in biking, try elliptical   Followup in 6 weeks

## 2013-06-04 NOTE — Progress Notes (Signed)
CC: Followup bilateral lateral hip pain HPI: Norma returns for followup today. Unfortunately, her lateral hip pain has not improved at all. On further review she says that this has been going on for many years. It initially started in the right hip and then became bilateral. He has been in the same lateral position. She has pain with sleeping on either side. We had previously noted that she had significant weakness on hip abduction and extension and she was given a home exercise plan. She states she did these diligently for several weeks but did not notice significant improvement in her pain. She may have had mild improvement in her strength. However, she has been busy traveling and going to weddings and has not been as diligent about doing exercises. She is continued to have severe pain. The pain is worse going up and down stairs. Sometimes she actually has to go upstairs on her hands and knees because of the pain. She received a greater trochanter corticosteroid injection at our last appointment which provided less than 25% pain relief and only helped for about 2 weeks.  ROS: As above in the HPI. All other systems are stable or negative.  OBJECTIVE: APPEARANCE:  Patient in no acute distress.The patient appeared well nourished and normally developed. HEENT: No scleral icterus. Conjunctiva non-injected Resp: Non labored Skin: No rash MSK:  Bilateral Hip exam:  - No swelling or deformity - Full range of motion in flexion, extension, internal and external rotation without pain - Negative logroll - Tenderness to palpation over bilateral greater trochanter as well as down the lateral thigh - Strength is 4-/5 on hip abduction, flexion, and extension - Neurovascularly intact - FABERs negative   ASSESSMENT: #1. 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 Her presentation is less consistent with a trochanteric bursitis or a tendinopathy of one of the muscles that attach to the greater trochanter. At this point, I think we need to start to think more broadly. I would recommend that she take a trial off of her statin for the next couple months to see this improves her pain. We explained that she should start to see some benefit within a month if this is the etiology of her symptoms. She does have a physical coming up tomorrow with her PCP. We recommended that she have a CPK and vitamin D level drawn at that appointment as well. We have also asked her to start taking vitamin D 1000-2000 units daily. She does certainly have weakness out of proportion to what we would expect to see. She does have a history of low back problems and has known severe disc disease at the L4-L5 and L5-S1 level. If she does not improve off of the statin or with vitamin D supplementation we may need to consider I nerve conduction study/EMG to see if there is some sort of muscular inhibition related to nerve damage in her back. We will see her back in 6 weeks for recheck.

## 2013-06-05 ENCOUNTER — Encounter: Payer: Self-pay | Admitting: Internal Medicine

## 2013-06-05 ENCOUNTER — Ambulatory Visit (INDEPENDENT_AMBULATORY_CARE_PROVIDER_SITE_OTHER): Payer: Medicare Other | Admitting: Internal Medicine

## 2013-06-05 ENCOUNTER — Other Ambulatory Visit (INDEPENDENT_AMBULATORY_CARE_PROVIDER_SITE_OTHER): Payer: Medicare Other

## 2013-06-05 VITALS — BP 100/68 | HR 81 | Temp 98.5°F | Ht 64.0 in | Wt 157.1 lb

## 2013-06-05 DIAGNOSIS — IMO0001 Reserved for inherently not codable concepts without codable children: Secondary | ICD-10-CM

## 2013-06-05 DIAGNOSIS — Z0000 Encounter for general adult medical examination without abnormal findings: Secondary | ICD-10-CM

## 2013-06-05 DIAGNOSIS — I10 Essential (primary) hypertension: Secondary | ICD-10-CM

## 2013-06-05 DIAGNOSIS — E782 Mixed hyperlipidemia: Secondary | ICD-10-CM

## 2013-06-05 LAB — BASIC METABOLIC PANEL
CO2: 31 mEq/L (ref 19–32)
Calcium: 9.5 mg/dL (ref 8.4–10.5)
Creatinine, Ser: 0.8 mg/dL (ref 0.4–1.2)

## 2013-06-05 LAB — CBC WITH DIFFERENTIAL/PLATELET
Eosinophils Absolute: 0.1 10*3/uL (ref 0.0–0.7)
Eosinophils Relative: 0.8 % (ref 0.0–5.0)
HCT: 44 % (ref 36.0–46.0)
Lymphs Abs: 2.6 10*3/uL (ref 0.7–4.0)
MCHC: 34.2 g/dL (ref 30.0–36.0)
MCV: 92.6 fl (ref 78.0–100.0)
Monocytes Absolute: 0.5 10*3/uL (ref 0.1–1.0)
Platelets: 255 10*3/uL (ref 150.0–400.0)
RDW: 12.7 % (ref 11.5–14.6)
WBC: 8.6 10*3/uL (ref 4.5–10.5)

## 2013-06-05 LAB — LIPID PANEL
Cholesterol: 172 mg/dL (ref 0–200)
HDL: 58.8 mg/dL (ref >39.00)
Triglycerides: 125 mg/dL (ref 0.0–149.0)

## 2013-06-05 LAB — CK: Total CK: 67 U/L (ref 7–177)

## 2013-06-05 LAB — HEPATIC FUNCTION PANEL
Albumin: 4.2 g/dL (ref 3.5–5.2)
Bilirubin, Direct: 0.1 mg/dL (ref 0.0–0.3)
Total Protein: 7.2 g/dL (ref 6.0–8.3)

## 2013-06-05 LAB — TSH: TSH: 1.1 u[IU]/mL (ref 0.35–5.50)

## 2013-06-05 MED ORDER — HYDROCHLOROTHIAZIDE 25 MG PO TABS: ORAL_TABLET | ORAL | Status: DC

## 2013-06-05 MED ORDER — PRAVASTATIN SODIUM 20 MG PO TABS: ORAL_TABLET | ORAL | Status: DC

## 2013-06-05 MED ORDER — LISINOPRIL 40 MG PO TABS: ORAL_TABLET | ORAL | Status: DC

## 2013-06-05 MED ORDER — HYDROCHLOROTHIAZIDE 25 MG PO TABS: 25.0000 mg | ORAL_TABLET | Freq: Every day | ORAL | Status: DC | PRN

## 2013-06-05 NOTE — Assessment & Plan Note (Signed)
BP Readings from Last 3 Encounters:  06/05/13 100/68  06/04/13 104/71  04/24/13 96/63   Low normal since lose of >20# since summer 2014 Change hctz to prn consider DC ACEI or decrease Dilt if remains low or symptomatic

## 2013-06-05 NOTE — Progress Notes (Signed)
Subjective:    Patient ID: [name], female    DOB: [birth year], 67 y.o.   MRN: [id number]  HPI   Here for medicare wellness  Diet: heart healthy or DM if diabetic Physical activity: sedentary Depression/mood screen: negative Hearing: intact to whispered voice Visual acuity: grossly normal, performs annual eye exam  ADLs: capable Fall risk: none Home safety: good Cognitive evaluation: intact to orientation, naming, recall and repetition EOL planning: adv directives, full code/ I agree  I have personally reviewed and have noted 1. The patient's medical and social history 2. Their use of alcohol, tobacco or illicit drugs 3. Their current medications and supplements 4. The patient's functional ability including ADL's, fall risks, home safety risks and hearing or visual impairment. 5. Diet and physical activities 6. Evidence for depression or mood disorders   Also reviewed chronic medical issues today and interval medical events  complains of continued diffuse arthritis and bursitis pains Ongoing >2 years Working with ortho, sports med on same s/p eval by [facility] 2012-2013 - symptoms NOT attributable to back Not improved pred taper from ortho, celebrex or troch bursitis injections Does not use NSAIDs due to anticoag for AF (per cards) ?statin side effects   Hypertension. Reports lower blood pressures since losing weight. Occasional symptomatic if systolic less than 100 -no presyncope, angina, edema. Reports compliance of medications as prescribed  Atrial fibrillation. Remains on pradxa anticoagulation and diltiazem rate control. Follows with cardiology semiannually. No chest pain or palpitations  Dyslipidemia. On statin. ?Causing myalgia side effects. Reports 20 pound weight loss in past 3 months, ?continued need for statin   Past Medical History  Diagnosis Date  . Mixed hyperlipidemia   . MITRAL VALVE PROLAPSE   . HYPERTENSION   . Atrial fibrillation     chronic  anticoag - Pradaxa  . BURSITIS, SUBTROCHANTERIC   . ARTHRITIS, HIPS, BILATERAL    Family History  Problem Relation Age of Onset  . Leukemia Father   . Hypertension Mother   . Heart failure Mother    History  Substance Use Topics  . Smoking status: Former Games developer  . Smokeless tobacco: Not on file  . Alcohol Use: Not on file     Review of Systems  Constitutional: Negative for fatigue and unexpected weight change.  Respiratory: Negative for cough, shortness of breath and wheezing.   Cardiovascular: Negative for chest pain, palpitations and leg swelling.  Gastrointestinal: Negative for nausea, abdominal pain and diarrhea.  Neurological: Negative for dizziness, weakness, light-headedness and headaches.  Psychiatric/Behavioral: Negative for dysphoric mood. The patient is not nervous/anxious.   All other systems reviewed and are negative.        Objective:   Physical Exam BP 100/68  Pulse 81  Temp(Src) 98.5 F (36.9 C) (Oral)  Ht 5\' 4"  (1.626 m)  Wt 157 lb 1.9 oz (71.269 kg)  BMI 26.96 kg/m2  SpO2 97% Wt Readings from Last 3 Encounters:  06/05/13 157 lb 1.9 oz (71.269 kg)  06/04/13 166 lb (75.297 kg)  04/24/13 166 lb (75.297 kg)   Constitutional: She is overweight, but appears well-developed and well-nourished. No distress.  HENT: Head: Normocephalic and atraumatic. Ears: B TMs ok, no erythema or effusion; Nose: Nose normal. Mouth/Throat: Oropharynx is clear and moist. No oropharyngeal exudate.  Eyes: Conjunctivae and EOM are normal. Pupils are equal, round, and reactive to light. No scleral icterus.  Neck: Normal range of motion. Neck supple. No JVD present. No thyromegaly present.  Cardiovascular: Normal rate, regular rhythm and  normal heart sounds.  No murmur heard. No BLE edema. Pulmonary/Chest: Effort normal and breath sounds normal. No respiratory distress. She has no wheezes.  Abdominal: Soft. Bowel sounds are normal. She exhibits no distension. There is no  tenderness. no masses Musculoskeletal: OA change B hands PIP and DIP - no synovitis. Normal range of motion, no joint effusions. No gross deformities Neurological: She is alert and oriented to person, place, and time. No cranial nerve deficit. Coordination normal.  Skin: Skin is warm and [name]. No rash noted. No erythema.  Psychiatric: She has a normal mood and affect. Her behavior is normal. Judgment and thought content normal.   Lab Results  Component Value Date   WBC 8.2 12/14/2011   HGB 14.4 12/14/2011   HCT 42.7 12/14/2011   PLT 228.0 12/14/2011   GLUCOSE 92 12/14/2011   CHOL 180 12/14/2011   TRIG 93.0 12/14/2011   HDL 69.00 12/14/2011   LDLDIRECT 149.3 09/25/2008   LDLCALC 92 12/14/2011   ALT 19 12/14/2011   AST 19 12/14/2011   NA 143 12/14/2011   K 4.3 12/14/2011   CL 107 12/14/2011   CREATININE 0.7 12/14/2011   BUN 15 12/14/2011   CO2 27 12/14/2011   TSH 1.23 12/14/2011   INR 1.4 01/25/2010       Assessment & Plan:  AWV/CPX/v70.0 - Today patient counseled on age appropriate routine health concerns for screening and prevention, each reviewed and up to date or declined. Immunizations reviewed and up to date or declined. Labs reviewed. Risk factors for depression reviewed and negative. Hearing function and visual acuity are intact. ADLs screened and addressed as needed. Functional ability and level of safety reviewed and appropriate. Education, counseling and referrals performed based on assessed risks today. Patient provided with a copy of personalized plan for preventive services.  Myalgias/bursitis -ongoing treatment with sports medicine reviewed Check CPK and vitamin D as requested  Also See problem list. Medications and labs reviewed today.

## 2013-06-05 NOTE — Assessment & Plan Note (Signed)
On statin - last check at goal, monitor annually Will hold statin for 30d trial to see if myalgias improved (sees [facility] rec 06/04/13) Potential risk/benefit of tx and holding same reviewed - pt understands and agrees to same

## 2013-06-05 NOTE — Progress Notes (Signed)
Pre-visit discussion using our clinic review tool. No additional management support is needed unless otherwise documented below in the visit note.  

## 2013-06-05 NOTE — Patient Instructions (Addendum)
It was good to see you today.  We have reviewed your prior records including labs and tests today  Health Maintenance reviewed - all recommended immunizations and age-appropriate screenings are up-to-date.  Test(s) ordered today. Your results will be released to MyChart (or called to you) after review, usually within 72hours after test completion. If any changes need to be made, you will be notified at that same time.  Medications reviewed and updated:  holld pravastatin for the next 30 days or until further notice Change hydrochlorothiazide to as needed only for ankle swelling or edema No other changes recommended at this time.  Refill on medication(s) as discussed today.  Continue working with your other specialists as reviewed today  Please schedule followup in 6-12 months, call sooner if problems.  Health Maintenance, Females A healthy lifestyle and preventative care can promote health and wellness.  Maintain regular health, dental, and eye exams.  Eat a healthy diet. Foods like vegetables, fruits, whole grains, low-fat dairy products, and lean protein foods contain the nutrients you need without too many calories. Decrease your intake of foods high in solid fats, added sugars, and salt. Get information about a proper diet from your caregiver, if necessary.  Regular physical exercise is one of the most important things you can do for your health. Most adults should get at least 150 minutes of moderate-intensity exercise (any activity that increases your heart rate and causes you to sweat) each week. In addition, most adults need muscle-strengthening exercises on 2 or more days a week.   Maintain a healthy weight. The body mass index (BMI) is a screening tool to identify possible weight problems. It provides an estimate of body fat based on height and weight. Your caregiver can help determine your BMI, and can help you achieve or maintain a healthy weight. For adults 20 years and  older:  A BMI below 18.5 is considered underweight.  A BMI of 18.5 to 24.9 is normal.  A BMI of 25 to 29.9 is considered overweight.  A BMI of 30 and above is considered obese.  Maintain normal blood lipids and cholesterol by exercising and minimizing your intake of saturated fat. Eat a balanced diet with plenty of fruits and vegetables. Blood tests for lipids and cholesterol should begin at age 72 and be repeated every 5 years. If your lipid or cholesterol levels are high, you are over 50, or you are a high risk for heart disease, you may need your cholesterol levels checked more frequently.Ongoing high lipid and cholesterol levels should be treated with medicines if diet and exercise are not effective.  If you smoke, find out from your caregiver how to quit. If you do not use tobacco, do not start.  If you are pregnant, do not drink alcohol. If you are breastfeeding, be very cautious about drinking alcohol. If you are not pregnant and choose to drink alcohol, do not exceed 1 drink per day. One drink is considered to be 12 ounces (355 mL) of beer, 5 ounces (148 mL) of wine, or 1.5 ounces (44 mL) of liquor.  Avoid use of street drugs. Do not share needles with anyone. Ask for help if you need support or instructions about stopping the use of drugs.  High blood pressure causes heart disease and increases the risk of stroke. Blood pressure should be checked at least every 1 to 2 years. Ongoing high blood pressure should be treated with medicines, if weight loss and exercise are not effective.  If you  are 63 to 66 years old, ask your caregiver if you should take aspirin to prevent strokes.  Diabetes screening involves taking a blood sample to check your fasting blood sugar level. This should be done once every 3 years, after age 76, if you are within normal weight and without risk factors for diabetes. Testing should be considered at a younger age or be carried out more frequently if you are  overweight and have at least 1 risk factor for diabetes.  Breast cancer screening is essential preventative care for women. You should practice "breast self-awareness." This means understanding the normal appearance and feel of your breasts and may include breast self-examination. Any changes detected, no matter how small, should be reported to a caregiver. Women in their 54s and 30s should have a clinical breast exam (CBE) by a caregiver as part of a regular health exam every 1 to 3 years. After age 65, women should have a CBE every year. Starting at age 60, women should consider having a mammogram (breast X-ray) every year. Women who have a family history of breast cancer should talk to their caregiver about genetic screening. Women at a high risk of breast cancer should talk to their caregiver about having an MRI and a mammogram every year.  The Pap test is a screening test for cervical cancer. Women should have a Pap test starting at age 31. Between ages 48 and 56, Pap tests should be repeated every 2 years. Beginning at age 4, you should have a Pap test every 3 years as long as the past 3 Pap tests have been normal. If you had a hysterectomy for a problem that was not cancer or a condition that could lead to cancer, then you no longer need Pap tests. If you are between ages 55 and 19, and you have had normal Pap tests going back 10 years, you no longer need Pap tests. If you have had past treatment for cervical cancer or a condition that could lead to cancer, you need Pap tests and screening for cancer for at least 20 years after your treatment. If Pap tests have been discontinued, risk factors (such as a new sexual partner) need to be reassessed to determine if screening should be resumed. Some women have medical problems that increase the chance of getting cervical cancer. In these cases, your caregiver may recommend more frequent screening and Pap tests.  The human papillomavirus (HPV) test is an  additional test that may be used for cervical cancer screening. The HPV test looks for the virus that can cause the cell changes on the cervix. The cells collected during the Pap test can be tested for HPV. The HPV test could be used to screen women aged 44 years and older, and should be used in women of any age who have unclear Pap test results. After the age of 41, women should have HPV testing at the same frequency as a Pap test.  Colorectal cancer can be detected and often prevented. Most routine colorectal cancer screening begins at the age of 87 and continues through age 24. However, your caregiver may recommend screening at an earlier age if you have risk factors for colon cancer. On a yearly basis, your caregiver may provide home test kits to check for hidden blood in the stool. Use of a small camera at the end of a tube, to directly examine the colon (sigmoidoscopy or colonoscopy), can detect the earliest forms of colorectal cancer. Talk to your caregiver about  this at age 71, when routine screening begins. Direct examination of the colon should be repeated every 5 to 10 years through age 34, unless early forms of pre-cancerous polyps or small growths are found.  Hepatitis C blood testing is recommended for all people born from [birth year] through 1964 and any individual with known risks for hepatitis C.  Practice safe sex. Use condoms and avoid high-risk sexual practices to reduce the spread of sexually transmitted infections (STIs). Sexually active women aged 61 and younger should be checked for Chlamydia, which is a common sexually transmitted infection. Older women with new or multiple partners should also be tested for Chlamydia. Testing for other STIs is recommended if you are sexually active and at increased risk.  Osteoporosis is a disease in which the bones lose minerals and strength with aging. This can result in serious bone fractures. The risk of osteoporosis can be identified using a bone  density scan. Women ages 9 and over and women at risk for fractures or osteoporosis should discuss screening with their caregivers. Ask your caregiver whether you should be taking a calcium supplement or vitamin D to reduce the rate of osteoporosis.  Menopause can be associated with physical symptoms and risks. Hormone replacement therapy is available to decrease symptoms and risks. You should talk to your caregiver about whether hormone replacement therapy is right for you.  Use sunscreen with a sun protection factor (SPF) of 30 or greater. Apply sunscreen liberally and repeatedly throughout the day. You should seek shade when your shadow is shorter than you. Protect yourself by wearing long sleeves, pants, a wide-brimmed hat, and sunglasses year round, whenever you are outdoors.  Notify your caregiver of new moles or changes in moles, especially if there is a change in shape or color. Also notify your caregiver if a mole is larger than the size of a pencil eraser.  Stay current with your immunizations. Document Released: 02/06/2011 Document Revised: 10/16/2011 Document Reviewed: 02/06/2011 [facility] Patient Information 2014 [place], [place].

## 2013-06-06 LAB — VITAMIN D 25 HYDROXY (VIT D DEFICIENCY, FRACTURES): Vit D, 25-Hydroxy: 26 ng/mL — ABNORMAL LOW (ref 30–89)

## 2013-06-10 DIAGNOSIS — H25049 Posterior subcapsular polar age-related cataract, unspecified eye: Secondary | ICD-10-CM | POA: Diagnosis not present

## 2013-06-10 DIAGNOSIS — H52209 Unspecified astigmatism, unspecified eye: Secondary | ICD-10-CM | POA: Diagnosis not present

## 2013-06-11 ENCOUNTER — Ambulatory Visit (INDEPENDENT_AMBULATORY_CARE_PROVIDER_SITE_OTHER): Payer: Medicare Other | Admitting: Internal Medicine

## 2013-06-11 ENCOUNTER — Ambulatory Visit: Payer: Medicare Other | Admitting: Internal Medicine

## 2013-06-11 ENCOUNTER — Ambulatory Visit (INDEPENDENT_AMBULATORY_CARE_PROVIDER_SITE_OTHER)
Admission: RE | Admit: 2013-06-11 | Discharge: 2013-06-11 | Disposition: A | Discharge status: Home / Self Care | Payer: Medicare Other | Source: Ambulatory Visit | Attending: Internal Medicine | Admitting: Internal Medicine

## 2013-06-11 ENCOUNTER — Encounter: Payer: Self-pay | Admitting: Internal Medicine

## 2013-06-11 VITALS — BP 112/70 | HR 82 | Temp 98.2°F

## 2013-06-11 DIAGNOSIS — M542 Cervicalgia: Secondary | ICD-10-CM

## 2013-06-11 DIAGNOSIS — M899 Disorder of bone, unspecified: Secondary | ICD-10-CM

## 2013-06-11 DIAGNOSIS — M47812 Spondylosis without myelopathy or radiculopathy, cervical region: Secondary | ICD-10-CM | POA: Diagnosis not present

## 2013-06-11 DIAGNOSIS — M8580 Other specified disorders of bone density and structure, unspecified site: Secondary | ICD-10-CM | POA: Insufficient documentation

## 2013-06-11 MED ORDER — PREDNISONE (PAK) 10 MG PO TABS: ORAL_TABLET | ORAL | Status: DC

## 2013-06-11 MED ORDER — CYCLOBENZAPRINE HCL 5 MG PO TABS: 5.0000 mg | ORAL_TABLET | Freq: Three times a day (TID) | ORAL | Status: DC | PRN

## 2013-06-11 NOTE — Progress Notes (Signed)
Pre-visit discussion using our clinic review tool. No additional management support is needed unless otherwise documented below in the visit note.  

## 2013-06-11 NOTE — Assessment & Plan Note (Signed)
Last DEXA at gyn 10/20/08 - Will arrange follow up now Continue WB exercises and Ca+D

## 2013-06-11 NOTE — Patient Instructions (Signed)
It was good to see you today.  We have reviewed your prior records including labs and tests today  Test(s) ordered today. Your results will be released to MyChart (or called to you) after review, usually within 72hours after test completion. If any changes need to be made, you will be notified at that same time.  Prednisone taper x 6 days and muscle relaxer at bedtime/as needed Your prescription(s) have been submitted to your pharmacy. Please take as directed and contact our office if you believe you are having problem(s) with the medication(s).  If symptoms unimproved in next 7-10 days, let me know for referral to physical therapy as needed -

## 2013-06-11 NOTE — Progress Notes (Signed)
  Subjective:    Patient ID: [name], female    DOB: 1946-06-22, 67 y.o.   MRN: [id number]  Neck Pain  This is a new problem. The current episode started in the past 7 days. The problem occurs constantly. The problem has been gradually improving. The pain is associated with an unknown factor. The pain is present in the left side. The quality of the pain is described as cramping. The pain is moderate. The symptoms are aggravated by position. The pain is same all the time. Stiffness is present all day. Pertinent negatives include no chest pain, fever, headaches, leg pain, numbness, pain with swallowing, paresis, tingling, trouble swallowing, visual change, weakness or weight loss. She has tried neck support (tramadol) for the symptoms. The treatment provided mild relief.   Past Medical History  Diagnosis Date  . Mixed hyperlipidemia   . MITRAL VALVE PROLAPSE   . HYPERTENSION   . Atrial fibrillation     chronic anticoag - Pradaxa  . BURSITIS, SUBTROCHANTERIC   . ARTHRITIS, HIPS, BILATERAL     Review of Systems  Constitutional: Negative for fever and weight loss.  HENT: Negative for trouble swallowing.   Cardiovascular: Negative for chest pain.  Musculoskeletal: Positive for neck pain.  Neurological: Negative for tingling, weakness, numbness and headaches.       Objective:   Physical Exam BP 112/70  Pulse 82  Temp(Src) 98.2 F (36.8 C) (Oral)  SpO2 97% Wt Readings from Last 3 Encounters:  06/05/13 157 lb 1.9 oz (71.269 kg)  06/04/13 166 lb (75.297 kg)  04/24/13 166 lb (75.297 kg)   Constitutional: She appears well-developed and well-nourished. No distress.  HENT: Head: Normocephalic and atraumatic. Ears: B TMs ok, no erythema or effusion; Nose: Nose normal. Mouth/Throat: Oropharynx is clear and moist. No oropharyngeal exudate.  Neck: Limited rotation to left due to pain/spasm. Neck supple. No JVD present. No thyromegaly present.  Cardiovascular: Normal rate, regular  rhythm and normal heart sounds.  No murmur heard. No BLE edema. Pulmonary/Chest: Effort normal and breath sounds normal. No respiratory distress. She has no wheezes.  Musculoskeletal: L shoulder with normal range of motion, no joint effusions. No gross deformities Neurological: She is alert and oriented to person, place, and time. No cranial nerve deficit. Coordination, balance, strength, speech and gait are normal.  Skin: Skin is warm and dry. No rash noted. No erythema.  Psychiatric: She has a normal mood and affect. Her behavior is normal. Judgment and thought content normal.        Assessment & Plan:   Cervicalgia  Suspect DDD flare- no neuro symptoms/deficits  Check plain film to eval for same  tx with pred taper x 6 days and Muscle relaxer - erx done  Consider PT refer

## 2013-06-19 ENCOUNTER — Ambulatory Visit (INDEPENDENT_AMBULATORY_CARE_PROVIDER_SITE_OTHER)
Admission: RE | Admit: 2013-06-19 | Discharge: 2013-06-19 | Disposition: A | Discharge status: Home / Self Care | Payer: Medicare Other | Source: Ambulatory Visit | Attending: Internal Medicine | Admitting: Internal Medicine

## 2013-06-19 DIAGNOSIS — M8580 Other specified disorders of bone density and structure, unspecified site: Secondary | ICD-10-CM

## 2013-06-19 DIAGNOSIS — M899 Disorder of bone, unspecified: Secondary | ICD-10-CM | POA: Diagnosis not present

## 2013-07-02 ENCOUNTER — Emergency Department (HOSPITAL_COMMUNITY): Payer: Medicare Other

## 2013-07-02 ENCOUNTER — Encounter (HOSPITAL_COMMUNITY): Payer: Self-pay | Admitting: Emergency Medicine

## 2013-07-02 ENCOUNTER — Emergency Department (HOSPITAL_COMMUNITY)
Admission: EM | Admit: 2013-07-02 | Discharge: 2013-07-02 | Disposition: A | Discharge status: Home / Self Care | Payer: Medicare Other | Attending: Emergency Medicine | Admitting: Emergency Medicine

## 2013-07-02 DIAGNOSIS — IMO0002 Reserved for concepts with insufficient information to code with codable children: Secondary | ICD-10-CM | POA: Diagnosis not present

## 2013-07-02 DIAGNOSIS — Y92009 Unspecified place in unspecified non-institutional (private) residence as the place of occurrence of the external cause: Secondary | ICD-10-CM | POA: Insufficient documentation

## 2013-07-02 DIAGNOSIS — S335XXA Sprain of ligaments of lumbar spine, initial encounter: Secondary | ICD-10-CM | POA: Diagnosis not present

## 2013-07-02 DIAGNOSIS — S39012A Strain of muscle, fascia and tendon of lower back, initial encounter: Secondary | ICD-10-CM

## 2013-07-02 DIAGNOSIS — Y9389 Activity, other specified: Secondary | ICD-10-CM | POA: Insufficient documentation

## 2013-07-02 DIAGNOSIS — R109 Unspecified abdominal pain: Secondary | ICD-10-CM | POA: Diagnosis not present

## 2013-07-02 DIAGNOSIS — X503XXA Overexertion from repetitive movements, initial encounter: Secondary | ICD-10-CM | POA: Insufficient documentation

## 2013-07-02 DIAGNOSIS — Z79899 Other long term (current) drug therapy: Secondary | ICD-10-CM | POA: Insufficient documentation

## 2013-07-02 LAB — CBC WITH DIFFERENTIAL/PLATELET
Basophils Relative: 0 % (ref 0–1)
HCT: 43.3 % (ref 36.0–46.0)
Hemoglobin: 15.1 g/dL — ABNORMAL HIGH (ref 12.0–15.0)
Lymphocytes Relative: 13 % (ref 12–46)
Lymphs Abs: 1.5 10*3/uL (ref 0.7–4.0)
MCH: 31.6 pg (ref 26.0–34.0)
MCHC: 34.9 g/dL (ref 30.0–36.0)
Monocytes Absolute: 0.6 10*3/uL (ref 0.1–1.0)
Monocytes Relative: 5 % (ref 3–12)
Neutro Abs: 9.3 10*3/uL — ABNORMAL HIGH (ref 1.7–7.7)
WBC: 11.5 10*3/uL — ABNORMAL HIGH (ref 4.0–10.5)

## 2013-07-02 LAB — URINALYSIS, ROUTINE W REFLEX MICROSCOPIC
Bilirubin Urine: NEGATIVE
Ketones, ur: NEGATIVE mg/dL
Protein, ur: NEGATIVE mg/dL
Urobilinogen, UA: 0.2 mg/dL (ref 0.0–1.0)
pH: 5 (ref 5.0–8.0)

## 2013-07-02 LAB — COMPREHENSIVE METABOLIC PANEL
Alkaline Phosphatase: 98 U/L (ref 39–117)
BUN: 14 mg/dL (ref 6–23)
CO2: 22 mEq/L (ref 19–32)
Chloride: 103 mEq/L (ref 96–112)
Creatinine, Ser: 0.71 mg/dL (ref 0.50–1.10)
GFR calc Af Amer: >90 mL/min (ref >90)
GFR calc non Af Amer: 88 mL/min — ABNORMAL LOW (ref >90)
Glucose, Bld: 99 mg/dL (ref 70–99)
Total Bilirubin: 0.6 mg/dL (ref 0.3–1.2)

## 2013-07-02 LAB — URINE MICROSCOPIC-ADD ON

## 2013-07-02 MED ORDER — ACETAMINOPHEN 325 MG PO TABS
650.0000 mg | ORAL_TABLET | Freq: Once | ORAL | Status: AC
  Administered 2013-07-02: 650 mg via ORAL
  Filled 2013-07-02: qty 2

## 2013-07-02 MED ORDER — CYCLOBENZAPRINE HCL 10 MG PO TABS: 5.0000 mg | ORAL_TABLET | Freq: Two times a day (BID) | ORAL | Status: DC | PRN

## 2013-07-02 MED ORDER — IOHEXOL 300 MG/ML  SOLN
100.0000 mL | Freq: Once | INTRAMUSCULAR | Status: AC | PRN
  Administered 2013-07-02: 100 mL via INTRAVENOUS

## 2013-07-02 MED ORDER — IOHEXOL 300 MG/ML  SOLN
50.0000 mL | Freq: Once | INTRAMUSCULAR | Status: AC | PRN
  Administered 2013-07-02: 50 mL via ORAL

## 2013-07-02 MED ORDER — HYDROCODONE-ACETAMINOPHEN 5-325 MG PO TABS
1.0000 | ORAL_TABLET | Freq: Four times a day (QID) | ORAL | Status: DC | PRN
Start: 2013-07-02 — End: 2014-04-02

## 2013-07-02 MED ORDER — MORPHINE SULFATE 2 MG/ML IJ SOLN
2.0000 mg | Freq: Once | INTRAMUSCULAR | Status: DC
  Filled 2013-07-02: qty 1

## 2013-07-02 NOTE — ED Notes (Signed)
Pt denies pain at present time with no movement. Pt refuses pain medication at present time.

## 2013-07-02 NOTE — ED Provider Notes (Signed)
CSN: [id number]     Arrival date & time 07/02/13  0408 History   First MD Initiated Contact with Patient 07/02/13 0430     Chief Complaint  Patient presents with  . Flank Pain   (Consider location/radiation/quality/duration/timing/severity/associated sxs/prior Treatment) HPI Comments: 29 rolled female presenting with right sided low back pain.  No known injuries, but was lifting cases of water yesterday. She has history of atrial fibrillation and is on Pradaxa.  She denies hematuria or urinary frequency. She denies fevers.  Patient is a 66 y.o. female presenting with flank pain.  Flank Pain This is a new problem. Episode onset: last night. The problem occurs constantly. The problem has been gradually worsening. Pertinent negatives include no chest pain, no abdominal pain and no shortness of breath. The symptoms are aggravated by bending (stretching). Nothing relieves the symptoms. Treatments tried: tramadol. The treatment provided no relief.    Past Medical History  Diagnosis Date  . Mixed hyperlipidemia   . MITRAL VALVE PROLAPSE   . HYPERTENSION   . Atrial fibrillation     chronic anticoag - Pradaxa  . BURSITIS, SUBTROCHANTERIC   . ARTHRITIS, HIPS, BILATERAL    Past Surgical History  Procedure Laterality Date  . Tonsillectomy    . Dilation and curettage of uterus     Family History  Problem Relation Age of Onset  . Leukemia Father   . Hypertension Mother   . Heart failure Mother    History  Substance Use Topics  . Smoking status: Never Smoker   . Smokeless tobacco: Not on file  . Alcohol Use: No   OB History   Grav Para Term Preterm Abortions TAB SAB Ect Mult Living                 Review of Systems  Constitutional: Negative for fever.  HENT: Negative for congestion.   Respiratory: Negative for cough and shortness of breath.   Cardiovascular: Negative for chest pain.  Gastrointestinal: Negative for nausea, vomiting, abdominal pain and diarrhea.  Genitourinary:  Positive for flank pain.  All other systems reviewed and are negative.    Allergies  Review of patient's allergies indicates no known allergies.  Home Medications   Current Outpatient Rx  Name  Route  Sig  Dispense  Refill  . cholecalciferol (VITAMIN D) 1000 UNITS tablet   Oral   Take 2,000 Units by mouth daily.         . cyclobenzaprine (FLEXERIL) 5 MG tablet   Oral   Take 1 tablet (5 mg total) by mouth 3 (three) times daily as needed for muscle spasms.   30 tablet   0   . dabigatran (PRADAXA) 150 MG CAPS capsule   Oral   Take 1 capsule (150 mg total) by mouth every 12 (twelve) hours.   180 capsule   3   . diltiazem (CARDIZEM CD) 240 MG 24 hr capsule   Oral   Take 1 capsule (240 mg total) by mouth daily.   90 capsule   3   . diltiazem (CARDIZEM) 30 MG tablet      Take prn for afib   30 tablet   1   . lisinopril (PRINIVIL,ZESTRIL) 40 MG tablet      Take 1 tablet by mouth  daily   90 tablet   3   . traMADol (ULTRAM) 50 MG tablet   Oral   Take 1 tablet (50 mg total) by mouth every 6 (six) hours as needed for pain.  120 tablet   1   . hydrochlorothiazide (HYDRODIURIL) 25 MG tablet   Oral   Take 1 tablet (25 mg total) by mouth daily as needed (swelling/edema).   90 tablet   3    BP 145/71  Pulse 94  Temp(Src) 97.9 F (36.6 C) (Oral)  Resp 18  SpO2 100% Physical Exam  Nursing note and vitals reviewed. Constitutional: She is oriented to person, place, and time. She appears well-developed and well-nourished. No distress.  HENT:  Head: Normocephalic and atraumatic.  Mouth/Throat: Oropharynx is clear and moist.  Eyes: Conjunctivae are normal. Pupils are equal, round, and reactive to light. No scleral icterus.  Neck: Neck supple.  Cardiovascular: Normal rate, regular rhythm, normal heart sounds and intact distal pulses.   No murmur heard. Pulmonary/Chest: Effort normal and breath sounds normal. No stridor. No respiratory distress. She has no rales.    Abdominal: Soft. Bowel sounds are normal. She exhibits no distension. There is no tenderness.  Musculoskeletal: Normal range of motion.       Back:  Mild tenderness to palpation in right mid and low back  Neurological: She is alert and oriented to person, place, and time. Gait normal.  Skin: Skin is warm and dry. No rash noted.  Psychiatric: She has a normal mood and affect. Her behavior is normal.    ED Course  Procedures (including critical care time) Labs Review Labs Reviewed  CBC WITH DIFFERENTIAL - Abnormal; Notable for the following:    WBC 11.5 (*)    Hemoglobin 15.1 (*)    Neutrophils Relative % 81 (*)    Neutro Abs 9.3 (*)    All other components within normal limits  COMPREHENSIVE METABOLIC PANEL - Abnormal; Notable for the following:    GFR calc non Af Amer 88 (*)    All other components within normal limits  URINALYSIS, ROUTINE W REFLEX MICROSCOPIC - Abnormal; Notable for the following:    APPearance CLOUDY (*)    Hgb urine dipstick LARGE (*)    Leukocytes, UA SMALL (*)    All other components within normal limits  URINE MICROSCOPIC-ADD ON   Imaging Review Ct Abdomen Pelvis W Contrast  07/02/2013   CLINICAL DATA:  Right flank pain.  EXAM: CT ABDOMEN AND PELVIS WITH CONTRAST  TECHNIQUE: Multidetector CT imaging of the abdomen and pelvis was performed using the standard protocol following bolus administration of intravenous contrast.  CONTRAST:  OMNIPAQUE IOHEXOL 300 MG/ML  SOLN  COMPARISON:  None.  FINDINGS: Minimal atelectasis in the lung bases. No effusions. Heart is borderline in size.  Liver, gallbladder, spleen, pancreas, adrenals have an unremarkable appearance. Bilateral renal cysts. No hydronephrosis. No renal or ureteral stones.  Appendix is visualized and is normal. Moderate stool throughout the colon. Small bowel is decompressed. No free fluid, free air or adenopathy.  Uterus, adnexae and urinary bladder are unremarkable. Aortic atherosclerosis.  No  acute bony abnormality.  IMPRESSION: No acute findings in the abdomen or pelvis. No explanation for the patient's right flank pain.   Electronically Signed   By: Charlett Nose M.D.   On: 07/02/2013 07:23  All radiology studies independently viewed by me.     EKG Interpretation   None       MDM   1. Back strain, initial encounter    67 year old female with right-sided back and flank pain.  Likely musculoskeletal, secondary to lifting heavy objects yesterday. However she does not recall pain during the event and she has minimal tenderness  to palpation, or activation of nearby muscles.  Due to age and use of anticoagulants, will obtain CT scan to evaluate for AAA, retroperitoneal hematoma, or other abdominal pathology.  UA showed blood, consistent with prior.  CT negative for acute process.  Pain likely secondary to muscle strain.  Plan low dose flexeril and norco for pain.    Candyce Churn, MD 07/02/13 2029

## 2013-07-02 NOTE — ED Notes (Addendum)
Patient states that she got out of bed to use the bathroom and felt a very bad ache to her right flank. Patient denies nausea, vomit, diarrhea or lightheadedness. Patient also denies any strenuous activity prior to going to bed. No pain with urination or blood noted in urine. Patient states she only had pain with movement.

## 2013-07-02 NOTE — ED Notes (Signed)
Pt ambulated to nearby restroom to void.  

## 2013-07-08 ENCOUNTER — Other Ambulatory Visit (HOSPITAL_COMMUNITY): Payer: Self-pay | Admitting: Obstetrics & Gynecology

## 2013-07-08 DIAGNOSIS — Z1231 Encounter for screening mammogram for malignant neoplasm of breast: Secondary | ICD-10-CM

## 2013-07-10 ENCOUNTER — Ambulatory Visit (INDEPENDENT_AMBULATORY_CARE_PROVIDER_SITE_OTHER): Payer: Medicare Other | Admitting: Internal Medicine

## 2013-07-10 ENCOUNTER — Encounter: Payer: Self-pay | Admitting: Internal Medicine

## 2013-07-10 VITALS — BP 120/92 | HR 89 | Temp 98.4°F | Wt 158.8 lb

## 2013-07-10 DIAGNOSIS — R109 Unspecified abdominal pain: Secondary | ICD-10-CM | POA: Diagnosis not present

## 2013-07-10 DIAGNOSIS — I4891 Unspecified atrial fibrillation: Secondary | ICD-10-CM

## 2013-07-10 NOTE — Progress Notes (Signed)
Pre-visit discussion using our clinic review tool. No additional management support is needed unless otherwise documented below in the visit note.  

## 2013-07-10 NOTE — Patient Instructions (Signed)
It was good to see you today.  We have reviewed your prior records including ER tests, labs and records today  Medications reviewed and updated, no changes recommended at this time.

## 2013-07-10 NOTE — Progress Notes (Signed)
Subjective:    Patient ID: [name], female    DOB: [birth year], 67 y.o.   MRN: [id number]  HPI  Here for ER follow up - R flank pain Now resolved  Past Medical History  Diagnosis Date  . Mixed hyperlipidemia   . MITRAL VALVE PROLAPSE   . HYPERTENSION   . Atrial fibrillation     chronic anticoag - Pradaxa  . BURSITIS, SUBTROCHANTERIC   . ARTHRITIS, HIPS, BILATERAL     Review of Systems  Cardiovascular: Negative for chest pain and leg swelling.  Gastrointestinal: Negative for abdominal pain.  Genitourinary: Negative for hematuria and flank pain.       Objective:   Physical Exam  BP 120/92  Pulse 89  Temp(Src) 98.4 F (36.9 C) (Oral)  Wt 158 lb 12.8 oz (72.031 kg)  SpO2 97% Wt Readings from Last 3 Encounters:  07/10/13 158 lb 12.8 oz (72.031 kg)  06/05/13 157 lb 1.9 oz (71.269 kg)  06/04/13 166 lb (75.297 kg)   Constitutional: She appears well-developed and well-nourished. No distress.  Cardiovascular: Normal rate, regular rhythm and normal heart sounds.  No murmur heard. No BLE edema. Pulmonary/Chest: Effort normal and breath sounds normal. No respiratory distress. She has no wheezes.  Musculoskeletal: Normal range of motion, no joint effusions. No gross deformities Neurological: She is alert and oriented to person, place, and time. No cranial nerve deficit. Coordination, balance, strength, speech and gait are normal.  Skin: Skin is warm and dry. No rash noted. No erythema.  Psychiatric: She has a normal mood and affect. Her behavior is normal. Judgment and thought content normal.   Lab Results  Component Value Date   WBC 11.5* 07/02/2013   HGB 15.1* 07/02/2013   HCT 43.3 07/02/2013   PLT 191 07/02/2013   GLUCOSE 99 07/02/2013   CHOL 172 06/05/2013   TRIG 125.0 06/05/2013   HDL 58.80 06/05/2013   LDLDIRECT 149.3 09/25/2008   LDLCALC 88 06/05/2013   ALT 12 07/02/2013   AST 15 07/02/2013   NA 138 07/02/2013   K 3.5 07/02/2013   CL 103 07/02/2013     CREATININE 0.71 07/02/2013   BUN 14 07/02/2013   CO2 22 07/02/2013   TSH 1.10 06/05/2013   INR 1.4 01/25/2010   Ct Abdomen Pelvis W Contrast  07/02/2013   CLINICAL DATA:  Right flank pain.  EXAM: CT ABDOMEN AND PELVIS WITH CONTRAST  TECHNIQUE: Multidetector CT imaging of the abdomen and pelvis was performed using the standard protocol following bolus administration of intravenous contrast.  CONTRAST:  OMNIPAQUE IOHEXOL 300 MG/ML  SOLN  COMPARISON:  None.  FINDINGS: Minimal atelectasis in the lung bases. No effusions. Heart is borderline in size.  Liver, gallbladder, spleen, pancreas, adrenals have an unremarkable appearance. Bilateral renal cysts. No hydronephrosis. No renal or ureteral stones.  Appendix is visualized and is normal. Moderate stool throughout the colon. Small bowel is decompressed. No free fluid, free air or adenopathy.  Uterus, adnexae and urinary bladder are unremarkable. Aortic atherosclerosis.  No acute bony abnormality.  IMPRESSION: No acute findings in the abdomen or pelvis. No explanation for the patient's right flank pain.   Electronically Signed   By: Charlett Nose M.D.   On: 07/02/2013 07:23       Assessment & Plan:   R flank pain - MSkel etiology related to overuse - ER eval for same 11/26 reviewed - neg CT for kidney stone or intraabd path Pain symptoms resolved with flexeril and norco  with 72h - pain free today  Reviewed ER eval - reassurance provided

## 2013-07-10 NOTE — Assessment & Plan Note (Signed)
PAF - on Pradaxa Follows with cards for same The current medical regimen is effective;  continue present plan and medications.

## 2013-07-16 ENCOUNTER — Ambulatory Visit: Payer: Medicare Other | Admitting: Sports Medicine

## 2013-07-21 DIAGNOSIS — J40 Bronchitis, not specified as acute or chronic: Secondary | ICD-10-CM | POA: Diagnosis not present

## 2013-07-21 DIAGNOSIS — J329 Chronic sinusitis, unspecified: Secondary | ICD-10-CM | POA: Diagnosis not present

## 2013-07-27 ENCOUNTER — Other Ambulatory Visit: Payer: Self-pay | Admitting: Internal Medicine

## 2013-07-28 ENCOUNTER — Telehealth: Payer: Self-pay

## 2013-07-28 MED ORDER — LISINOPRIL 40 MG PO TABS: ORAL_TABLET | ORAL | Status: DC

## 2013-07-28 NOTE — Telephone Encounter (Signed)
The patient called and is hoping to get an rx for lisinopril for the year. She states [facility] only giving her 90 days at a time.  She is hoping for an rx that she can call herself and get refilled every time.    Callback - [phone]

## 2013-07-28 NOTE — Telephone Encounter (Signed)
Called pt back inform her med was sent [facility] for year supply...Raechel Chute

## 2013-08-06 ENCOUNTER — Encounter: Payer: Self-pay | Admitting: Internal Medicine

## 2013-08-06 ENCOUNTER — Ambulatory Visit (INDEPENDENT_AMBULATORY_CARE_PROVIDER_SITE_OTHER): Payer: Medicare Other | Admitting: Internal Medicine

## 2013-08-06 VITALS — BP 145/80 | HR 93 | Ht 64.0 in | Wt 159.2 lb

## 2013-08-06 DIAGNOSIS — I4891 Unspecified atrial fibrillation: Secondary | ICD-10-CM | POA: Diagnosis not present

## 2013-08-06 DIAGNOSIS — I10 Essential (primary) hypertension: Secondary | ICD-10-CM | POA: Diagnosis not present

## 2013-08-06 DIAGNOSIS — E7800 Pure hypercholesterolemia, unspecified: Secondary | ICD-10-CM | POA: Diagnosis not present

## 2013-08-06 DIAGNOSIS — E782 Mixed hyperlipidemia: Secondary | ICD-10-CM | POA: Diagnosis not present

## 2013-08-06 NOTE — Progress Notes (Signed)
PCP: Rene Paci, MD  [name] is a 67 y.o. female who presents today for routine electrophysiology followup.  Since last being seen in our clinic, the patient reports doing very well.   She is unaware of her afib.  She is mostly limited by hip pain. Today, she denies symptoms of palpitations, chest pain, shortness of breath,  lower extremity edema, dizziness, presyncope, or syncope.  The patient is otherwise without complaint today.   Past Medical History  Diagnosis Date  . Mixed hyperlipidemia   . MITRAL VALVE PROLAPSE   . HYPERTENSION   . Atrial fibrillation     chronic anticoag - Pradaxa  . BURSITIS, SUBTROCHANTERIC   . ARTHRITIS, HIPS, BILATERAL    Past Surgical History  Procedure Laterality Date  . Tonsillectomy    . Dilation and curettage of uterus      Current Outpatient Prescriptions  Medication Sig Dispense Refill  . cholecalciferol (VITAMIN D) 1000 UNITS tablet Take 2,000 Units by mouth daily.      . cyclobenzaprine (FLEXERIL) 5 MG tablet Take 1 tablet (5 mg total) by mouth 3 (three) times daily as needed for muscle spasms.  30 tablet  0  . dabigatran (PRADAXA) 150 MG CAPS capsule Take 1 capsule (150 mg total) by mouth every 12 (twelve) hours.  180 capsule  3  . diltiazem (CARDIZEM CD) 240 MG 24 hr capsule Take 1 capsule (240 mg total) by mouth daily.  90 capsule  3  . diltiazem (CARDIZEM) 30 MG tablet Take as needed for afib      . hydrochlorothiazide (HYDRODIURIL) 25 MG tablet Take 1 tablet (25 mg total) by mouth daily as needed (swelling/edema).  90 tablet  3  . HYDROcodone-acetaminophen (NORCO/VICODIN) 5-325 MG per tablet Take 1 tablet by mouth every 6 (six) hours as needed.  10 tablet  0  . lisinopril (PRINIVIL,ZESTRIL) 40 MG tablet Take 1 tablet by mouth  daily  90 tablet  3  . traMADol (ULTRAM) 50 MG tablet Take 1 tablet (50 mg total) by mouth every 6 (six) hours as needed for pain.  120 tablet  1   No current facility-administered medications for this  visit.    Physical Exam: Filed Vitals:   08/06/13 1045  BP: 145/80  Pulse: 93  Height: 5\' 4"  (1.626 m)  Weight: 159 lb 3.2 oz (72.213 kg)    GEN- The patient is well appearing, alert and oriented x 3 today.   Head- normocephalic, atraumatic Eyes-  Sclera clear, conjunctiva pink Ears- hearing intact Oropharynx- clear Lungs- Clear to ausculation bilaterally, normal work of breathing Heart- irregular rate and rhythm, no murmurs, rubs or gallops, PMI not laterally displaced GI- soft, NT, ND, + BS Extremities- no clubbing, cyanosis, or edema  Ecg otday reveals afib, V rate 93 bpm, nonspecific ST/T changes  Assessment and Plan:  1. Persistent afib Asymptomatic Stable and adequately anticoagulated HR controlled  2. HTN Stable No change required today Weight loss is encouraged  3. HL She stopped simvastatin 2 months ago to see if her hip pain would improve but it has not We will repeat fasting lipids/ lfts at this time  Return in 12 months

## 2013-08-06 NOTE — Patient Instructions (Signed)
Your physician wants you to follow-up in: 12 months with Dr Jacquiline Doe will receive a reminder letter in the mail two months in advance. If you don't receive a letter, please call our office to schedule the follow-up appointment.  Your physician recommends that you return for lab work next week fasting

## 2013-08-14 DIAGNOSIS — H25049 Posterior subcapsular polar age-related cataract, unspecified eye: Secondary | ICD-10-CM | POA: Diagnosis not present

## 2013-08-14 DIAGNOSIS — H2589 Other age-related cataract: Secondary | ICD-10-CM | POA: Diagnosis not present

## 2013-09-02 ENCOUNTER — Ambulatory Visit (INDEPENDENT_AMBULATORY_CARE_PROVIDER_SITE_OTHER): Payer: Medicare Other | Admitting: Sports Medicine

## 2013-09-02 ENCOUNTER — Encounter: Payer: Self-pay | Admitting: Sports Medicine

## 2013-09-02 VITALS — BP 120/72 | HR 103 | Ht 64.0 in | Wt 159.0 lb

## 2013-09-02 DIAGNOSIS — M25551 Pain in right hip: Secondary | ICD-10-CM

## 2013-09-02 DIAGNOSIS — M25559 Pain in unspecified hip: Secondary | ICD-10-CM | POA: Diagnosis not present

## 2013-09-02 DIAGNOSIS — R319 Hematuria, unspecified: Secondary | ICD-10-CM | POA: Diagnosis not present

## 2013-09-02 DIAGNOSIS — M25552 Pain in left hip: Principal | ICD-10-CM

## 2013-09-02 NOTE — Progress Notes (Signed)
   Subjective:    Patient ID: [name], female    DOB: 1945-12-27, 68 y.o.   MRN: [id number]  HPI  Pt presents to clinic for f/u of bilateral hip pain which has improved slightly. Hip pain is now intermittent, was constant previously.  Walking has gotten easier, but still has pain with increased walking and with going up and downstairs has pain and weakness.  [name] has remained off statins since last visit. Labs checked by PCP - CK normal and vitamin D low.      Review of Systems     Objective:   Physical Exam  NAD  Good hip ROM bilaterally Straight leg raise neg bilat FABER tight on rt, and slightly tight on lt- causes some pain bilaterally Pain over left greater trochanter Weak hip abduction bilat Hip flexor and tensor fascialatta weak on rt, not on lt Glut max weak bilat Hamstring strong bilat          Assessment & Plan:

## 2013-09-02 NOTE — Patient Instructions (Signed)
Please try to get 10 meq of potassium daily- good sources - bananas and other fruits  Continue Vitamin D   Remain off statins   Please follow up with Dr. Nori Riis on large hemoglobin in your urine  Please follow up in 6 weeks  Thank you for seeing [place] today!

## 2013-09-02 NOTE — Assessment & Plan Note (Signed)
Large blood noted on UA this year and last  I suggested seeing her OB/GYN - Dr Nori Riis for her pelvic evaluation to see if external cause and getting this evaluated if not from an OB/GYN source

## 2013-09-02 NOTE — Assessment & Plan Note (Signed)
Some improvement with stopping statins Low Vit D and I want her to continue supplement Start PT to try to correct weakness  Reck 2 mos

## 2013-09-09 ENCOUNTER — Ambulatory Visit (HOSPITAL_COMMUNITY)
Admission: RE | Admit: 2013-09-09 | Discharge: 2013-09-09 | Disposition: A | Discharge status: Home / Self Care | Payer: Medicare Other | Source: Ambulatory Visit | Attending: Obstetrics & Gynecology | Admitting: Obstetrics & Gynecology

## 2013-09-09 ENCOUNTER — Ambulatory Visit: Payer: Medicare Other | Attending: Sports Medicine

## 2013-09-09 DIAGNOSIS — M949 Disorder of cartilage, unspecified: Secondary | ICD-10-CM

## 2013-09-09 DIAGNOSIS — Z1231 Encounter for screening mammogram for malignant neoplasm of breast: Secondary | ICD-10-CM | POA: Insufficient documentation

## 2013-09-09 DIAGNOSIS — I10 Essential (primary) hypertension: Secondary | ICD-10-CM | POA: Insufficient documentation

## 2013-09-09 DIAGNOSIS — M899 Disorder of bone, unspecified: Secondary | ICD-10-CM | POA: Insufficient documentation

## 2013-09-09 DIAGNOSIS — R5381 Other malaise: Secondary | ICD-10-CM | POA: Insufficient documentation

## 2013-09-09 DIAGNOSIS — I4891 Unspecified atrial fibrillation: Secondary | ICD-10-CM | POA: Insufficient documentation

## 2013-09-09 DIAGNOSIS — J45909 Unspecified asthma, uncomplicated: Secondary | ICD-10-CM | POA: Insufficient documentation

## 2013-09-09 DIAGNOSIS — M6281 Muscle weakness (generalized): Secondary | ICD-10-CM | POA: Insufficient documentation

## 2013-09-09 DIAGNOSIS — M25559 Pain in unspecified hip: Secondary | ICD-10-CM | POA: Insufficient documentation

## 2013-09-09 DIAGNOSIS — IMO0001 Reserved for inherently not codable concepts without codable children: Secondary | ICD-10-CM | POA: Insufficient documentation

## 2013-09-16 ENCOUNTER — Ambulatory Visit: Payer: Medicare Other

## 2013-09-16 DIAGNOSIS — I4891 Unspecified atrial fibrillation: Secondary | ICD-10-CM | POA: Diagnosis not present

## 2013-09-16 DIAGNOSIS — I10 Essential (primary) hypertension: Secondary | ICD-10-CM | POA: Diagnosis not present

## 2013-09-16 DIAGNOSIS — M6281 Muscle weakness (generalized): Secondary | ICD-10-CM | POA: Diagnosis not present

## 2013-09-16 DIAGNOSIS — R5381 Other malaise: Secondary | ICD-10-CM | POA: Diagnosis not present

## 2013-09-16 DIAGNOSIS — M949 Disorder of cartilage, unspecified: Secondary | ICD-10-CM | POA: Diagnosis not present

## 2013-09-16 DIAGNOSIS — IMO0001 Reserved for inherently not codable concepts without codable children: Secondary | ICD-10-CM | POA: Diagnosis not present

## 2013-09-16 DIAGNOSIS — M899 Disorder of bone, unspecified: Secondary | ICD-10-CM | POA: Diagnosis not present

## 2013-09-16 DIAGNOSIS — M25559 Pain in unspecified hip: Secondary | ICD-10-CM | POA: Diagnosis not present

## 2013-09-16 DIAGNOSIS — J45909 Unspecified asthma, uncomplicated: Secondary | ICD-10-CM | POA: Diagnosis not present

## 2013-09-23 ENCOUNTER — Ambulatory Visit: Payer: Medicare Other

## 2013-09-25 ENCOUNTER — Ambulatory Visit: Payer: Medicare Other

## 2013-09-25 ENCOUNTER — Other Ambulatory Visit: Payer: Self-pay | Admitting: Internal Medicine

## 2013-09-30 ENCOUNTER — Ambulatory Visit: Payer: Medicare Other

## 2013-10-02 ENCOUNTER — Ambulatory Visit: Payer: Medicare Other | Admitting: Physical Therapy

## 2013-10-07 ENCOUNTER — Ambulatory Visit: Payer: Medicare Other | Attending: Sports Medicine

## 2013-10-07 DIAGNOSIS — M25559 Pain in unspecified hip: Secondary | ICD-10-CM | POA: Diagnosis not present

## 2013-10-07 DIAGNOSIS — J45909 Unspecified asthma, uncomplicated: Secondary | ICD-10-CM | POA: Insufficient documentation

## 2013-10-07 DIAGNOSIS — M949 Disorder of cartilage, unspecified: Secondary | ICD-10-CM

## 2013-10-07 DIAGNOSIS — M6281 Muscle weakness (generalized): Secondary | ICD-10-CM | POA: Diagnosis not present

## 2013-10-07 DIAGNOSIS — IMO0001 Reserved for inherently not codable concepts without codable children: Secondary | ICD-10-CM | POA: Diagnosis not present

## 2013-10-07 DIAGNOSIS — I10 Essential (primary) hypertension: Secondary | ICD-10-CM | POA: Diagnosis not present

## 2013-10-07 DIAGNOSIS — R5381 Other malaise: Secondary | ICD-10-CM | POA: Insufficient documentation

## 2013-10-07 DIAGNOSIS — I4891 Unspecified atrial fibrillation: Secondary | ICD-10-CM | POA: Insufficient documentation

## 2013-10-07 DIAGNOSIS — M899 Disorder of bone, unspecified: Secondary | ICD-10-CM | POA: Diagnosis not present

## 2013-10-09 ENCOUNTER — Ambulatory Visit: Payer: Medicare Other | Admitting: Physical Therapy

## 2013-10-14 ENCOUNTER — Other Ambulatory Visit: Payer: Self-pay | Admitting: Internal Medicine

## 2013-10-14 ENCOUNTER — Ambulatory Visit: Payer: Medicare Other | Admitting: Physical Therapy

## 2013-10-16 ENCOUNTER — Ambulatory Visit (INDEPENDENT_AMBULATORY_CARE_PROVIDER_SITE_OTHER): Payer: Medicare Other | Admitting: Internal Medicine

## 2013-10-16 ENCOUNTER — Other Ambulatory Visit (INDEPENDENT_AMBULATORY_CARE_PROVIDER_SITE_OTHER): Payer: Medicare Other

## 2013-10-16 ENCOUNTER — Ambulatory Visit: Payer: Medicare Other

## 2013-10-16 ENCOUNTER — Encounter: Payer: Self-pay | Admitting: Internal Medicine

## 2013-10-16 VITALS — BP 130/82 | HR 99 | Temp 98.2°F | Wt 166.0 lb

## 2013-10-16 DIAGNOSIS — R319 Hematuria, unspecified: Secondary | ICD-10-CM | POA: Diagnosis not present

## 2013-10-16 DIAGNOSIS — R3129 Other microscopic hematuria: Secondary | ICD-10-CM

## 2013-10-16 LAB — POCT URINALYSIS DIPSTICK
Bilirubin, UA: NEGATIVE
Glucose, UA: NEGATIVE
Ketones, UA: NEGATIVE
NITRITE UA: NEGATIVE
Protein, UA: NEGATIVE
Spec Grav, UA: <=1.005
UROBILINOGEN UA: 0.2
pH, UA: 5

## 2013-10-16 LAB — URINALYSIS, ROUTINE W REFLEX MICROSCOPIC
Bilirubin Urine: NEGATIVE
KETONES UR: NEGATIVE
Nitrite: NEGATIVE
PH: 6 (ref 5.0–8.0)
Specific Gravity, Urine: 1.02 (ref 1.000–1.030)
TOTAL PROTEIN, URINE-UPE24: NEGATIVE
Urine Glucose: NEGATIVE
Urobilinogen, UA: 0.2 (ref 0.0–1.0)

## 2013-10-16 NOTE — Assessment & Plan Note (Signed)
Persisting hematuria - "large blood" on UA with 3-6 rbc for >56mo (actually intermittent for greater than 10 years per report by pt at IM and gyn providers over the years) Reports prior evaluation with gynecology for same unremarkable Recheck now UA with Ucx though denies UTI symptoms (point-of-care with large blood) Refer to urology for further evaluation - nonsmoker and no family history of bladder or kidney cancer

## 2013-10-16 NOTE — Patient Instructions (Addendum)
It was good to see you today.  We have reviewed your prior records including labs and tests today  Test(s) ordered today. Your results will be released to [place] (or called to you) after review, usually within 72hours after test completion. If any changes need to be made, you will be notified at that same time.  Medications reviewed and updated, no changes recommended at this time.  we'll make referral to urology for further evaluation of this issue. Our office will contact you regarding appointment(s) once made.  Please schedule followup as scheduled, call sooner if problems.  Hematuria, Adult Hematuria is blood in your urine. It can be caused by a bladder infection, kidney infection, prostate infection, kidney stone, or cancer of your urinary tract. Infections can usually be treated with medicine, and a kidney stone usually will pass through your urine. If neither of these is the cause of your hematuria, further workup to find out the reason may be needed. It is very important that you tell your health care provider about any blood you see in your urine, even if the blood stops without treatment or happens without causing pain. Blood in your urine that happens and then stops and then happens again can be a symptom of a very serious condition. Also, pain is not a symptom in the initial stages of many urinary cancers. HOME CARE INSTRUCTIONS   Drink lots of fluid, 3 4 quarts a day. If you have been diagnosed with an infection, cranberry juice is especially recommended, in addition to large amounts of water.  Avoid caffeine, tea, and carbonated beverages, because they tend to irritate the bladder.  Avoid alcohol because it may irritate the prostate.  Only take over-the-counter or prescription medicines for pain, discomfort, or fever as directed by your health care provider.  If you have been diagnosed with a kidney stone, follow your health care provider's instructions regarding straining  your urine to catch the stone.  Empty your bladder often. Avoid holding urine for long periods of time.  After a bowel movement, women should cleanse front to back. Use each tissue only once.  Empty your bladder before and after sexual intercourse if you are a female. SEEK MEDICAL CARE IF: You develop back pain, fever, a feeling of sickness in your stomach (nausea), or vomiting or if your symptoms are not better in 3 days. Return sooner if you are getting worse. SEEK IMMEDIATE MEDICAL CARE IF:   You have a persistent fever, with a temperature of 101.40F (38.8C) or greater.  You develop severe vomiting and are unable to keep the medicine down.  You develop severe back or abdominal pain despite taking your medicines.  You begin passing a large amount of blood or clots in your urine.  You feel extremely weak or faint, or you pass out. MAKE SURE YOU:   Understand these instructions.  Will watch your condition.  Will get help right away if you are not doing well or get worse. Document Released: 07/24/2005 Document Revised: 05/14/2013 Document Reviewed: 03/24/2013 [facility] Patient Information 2014 [place].

## 2013-10-16 NOTE — Progress Notes (Signed)
Pre visit review using our clinic review tool, if applicable. No additional management support is needed unless otherwise documented below in the visit note. 

## 2013-10-16 NOTE — Progress Notes (Signed)
Subjective:    Patient ID: [name], female    DOB: 12/17/1945, 68 y.o.   MRN: [id number]  Hematuria This is a new problem. The current episode started more than 1 year ago (reports intermittently noted by IM and gyn for >5-10 years). The problem is unchanged. She describes the hematuria as microscopic hematuria. She reports no clotting in her urine stream. She is experiencing no pain. She describes her urine color as clear. Irritative symptoms include nocturia. Irritative symptoms do not include frequency or urgency. Obstructive symptoms include incomplete emptying and a weak stream. Obstructive symptoms do not include dribbling, an intermittent stream, a slower stream or straining. Pertinent negatives include no abdominal pain, chills, facial swelling, fever, flank pain, genital pain, hesitancy, inability to urinate, nausea or vomiting.    Also reviewed chronic medical issues and interval medical events  Past Medical History  Diagnosis Date  . Mixed hyperlipidemia   . MITRAL VALVE PROLAPSE   . HYPERTENSION   . Atrial fibrillation     chronic anticoag - Pradaxa  . BURSITIS, SUBTROCHANTERIC   . ARTHRITIS, HIPS, BILATERAL     Review of Systems  Constitutional: Negative for fever and chills.  HENT: Negative for facial swelling.   Gastrointestinal: Negative for nausea, vomiting and abdominal pain.  Genitourinary: Positive for hematuria, incomplete emptying and nocturia. Negative for hesitancy, urgency, frequency and flank pain.       Objective:   Physical Exam  BP 130/82  Pulse 99  Temp(Src) 98.2 F (36.8 C) (Oral)  Wt 166 lb (75.297 kg)  SpO2 97% Wt Readings from Last 3 Encounters:  10/16/13 166 lb (75.297 kg)  09/02/13 159 lb (72.122 kg)  08/06/13 159 lb 3.2 oz (72.213 kg)    Constitutional: She appears well-developed and well-nourished. No distress.  Neck: Normal range of motion. Neck supple. No JVD present. No thyromegaly present.  Cardiovascular: Normal  rate, regular rhythm and normal heart sounds.  No murmur heard. No BLE edema. Pulmonary/Chest: Effort normal and breath sounds normal. No respiratory distress. She has no wheezes.  Abdominal: SNTND, +BS, no flank pain Psychiatric: She has a normal mood and affect. Her behavior is normal. Judgment and thought content normal.   Lab Results  Component Value Date   WBC 11.5* 07/02/2013   HGB 15.1* 07/02/2013   HCT 43.3 07/02/2013   PLT 191 07/02/2013   GLUCOSE 99 07/02/2013   CHOL 172 06/05/2013   TRIG 125.0 06/05/2013   HDL 58.80 06/05/2013   LDLDIRECT 149.3 09/25/2008   LDLCALC 88 06/05/2013   ALT 12 07/02/2013   AST 15 07/02/2013   NA 138 07/02/2013   K 3.5 07/02/2013   CL 103 07/02/2013   CREATININE 0.71 07/02/2013   BUN 14 07/02/2013   CO2 22 07/02/2013   TSH 1.10 06/05/2013   INR 1.4 01/25/2010    Mm Screening Breast Tomo Bilateral  09/11/2013   CLINICAL DATA:  Screening.  EXAM: DIGITAL SCREENING BILATERAL MAMMOGRAM WITH 3D TOMO WITH CAD  COMPARISON:  Previous exam(s).  ACR Breast Density Category b: There are scattered areas of fibroglandular density.  FINDINGS: There are no findings suspicious for malignancy. Images were processed with CAD.  IMPRESSION: No mammographic evidence of malignancy. A result letter of this screening mammogram will be mailed directly to the patient.  RECOMMENDATION: Screening mammogram in one year. (Code:[id number]-B-01Y)  BI-RADS CATEGORY  1: Negative.   Electronically Signed   By: Enrique Sack M.D.   On: 09/11/2013 08:43   POC urine:  large blood     Assessment & Plan:   Problem List Items Addressed This Visit   Hematuria, undiagnosed cause     Persisting hematuria - "large blood" on UA with 3-6 rbc for >28mo (actually intermittent for greater than 10 years per report by pt at IM and gyn providers over the years) Reports prior evaluation with gynecology for same unremarkable Recheck now UA with Ucx though denies UTI symptoms (point-of-care with large  blood) Refer to urology for further evaluation - nonsmoker and no family history of bladder or kidney cancer    Relevant Orders      Urinalysis      Urine culture      Ambulatory referral to Urology    Other Visit Diagnoses   Hematuria    -  Primary    Relevant Orders       POCT Urinalysis Dipstick (Completed)       Urinalysis       Urine culture       Ambulatory referral to Urology    Microscopic hematuria

## 2013-10-18 LAB — URINE CULTURE
Colony Count: NO GROWTH
ORGANISM ID, BACTERIA: NO GROWTH

## 2013-10-21 ENCOUNTER — Ambulatory Visit: Payer: Medicare Other | Admitting: Physical Therapy

## 2013-10-23 ENCOUNTER — Other Ambulatory Visit: Payer: Self-pay | Admitting: *Deleted

## 2013-10-23 ENCOUNTER — Ambulatory Visit: Payer: Medicare Other | Admitting: Physical Therapy

## 2013-10-23 MED ORDER — TRAMADOL HCL 50 MG PO TABS: 50.0000 mg | ORAL_TABLET | Freq: Four times a day (QID) | ORAL | Status: DC | PRN

## 2013-10-27 ENCOUNTER — Ambulatory Visit: Payer: Medicare Other | Admitting: Physical Therapy

## 2013-10-28 ENCOUNTER — Encounter: Payer: Medicare Other | Admitting: Physical Therapy

## 2013-10-28 ENCOUNTER — Ambulatory Visit: Payer: Medicare Other | Admitting: Sports Medicine

## 2013-10-30 ENCOUNTER — Ambulatory Visit: Payer: Medicare Other

## 2013-11-13 DIAGNOSIS — N318 Other neuromuscular dysfunction of bladder: Secondary | ICD-10-CM | POA: Diagnosis not present

## 2013-11-13 DIAGNOSIS — R3129 Other microscopic hematuria: Secondary | ICD-10-CM | POA: Diagnosis not present

## 2013-11-18 ENCOUNTER — Ambulatory Visit: Payer: Medicare Other | Admitting: Sports Medicine

## 2013-11-25 ENCOUNTER — Other Ambulatory Visit (INDEPENDENT_AMBULATORY_CARE_PROVIDER_SITE_OTHER): Payer: Medicare Other

## 2013-11-25 DIAGNOSIS — E7800 Pure hypercholesterolemia, unspecified: Secondary | ICD-10-CM

## 2013-11-25 LAB — LIPID PANEL
Cholesterol: 219 mg/dL — ABNORMAL HIGH (ref 0–200)
HDL: 57.8 mg/dL (ref >39.00)
LDL Cholesterol: 138 mg/dL — ABNORMAL HIGH (ref 0–99)
TRIGLYCERIDES: 115 mg/dL (ref 0.0–149.0)
Total CHOL/HDL Ratio: 4
VLDL: 23 mg/dL (ref 0.0–40.0)

## 2013-11-25 LAB — HEPATIC FUNCTION PANEL
ALBUMIN: 3.7 g/dL (ref 3.5–5.2)
ALT: 11 U/L (ref 0–35)
AST: 16 U/L (ref 0–37)
Alkaline Phosphatase: 79 U/L (ref 39–117)
Bilirubin, Direct: 0 mg/dL (ref 0.0–0.3)
Total Bilirubin: 0.8 mg/dL (ref 0.3–1.2)
Total Protein: 6.6 g/dL (ref 6.0–8.3)

## 2013-12-04 ENCOUNTER — Ambulatory Visit
Admission: RE | Admit: 2013-12-04 | Discharge: 2013-12-04 | Disposition: A | Discharge status: Home / Self Care | Payer: Medicare Other | Source: Ambulatory Visit | Attending: Family Medicine | Admitting: Family Medicine

## 2013-12-04 ENCOUNTER — Ambulatory Visit (INDEPENDENT_AMBULATORY_CARE_PROVIDER_SITE_OTHER): Payer: Medicare Other | Admitting: Sports Medicine

## 2013-12-04 ENCOUNTER — Encounter: Payer: Self-pay | Admitting: Sports Medicine

## 2013-12-04 ENCOUNTER — Other Ambulatory Visit: Payer: Self-pay | Admitting: *Deleted

## 2013-12-04 VITALS — BP 137/101 | HR 94 | Ht 64.0 in | Wt 166.0 lb

## 2013-12-04 DIAGNOSIS — G8929 Other chronic pain: Secondary | ICD-10-CM | POA: Diagnosis not present

## 2013-12-04 DIAGNOSIS — M25551 Pain in right hip: Secondary | ICD-10-CM

## 2013-12-04 DIAGNOSIS — M5136 Other intervertebral disc degeneration, lumbar region: Secondary | ICD-10-CM

## 2013-12-04 DIAGNOSIS — M47817 Spondylosis without myelopathy or radiculopathy, lumbosacral region: Secondary | ICD-10-CM | POA: Diagnosis not present

## 2013-12-04 DIAGNOSIS — M25552 Pain in left hip: Principal | ICD-10-CM

## 2013-12-04 DIAGNOSIS — M25559 Pain in unspecified hip: Secondary | ICD-10-CM | POA: Diagnosis not present

## 2013-12-04 DIAGNOSIS — M76899 Other specified enthesopathies of unspecified lower limb, excluding foot: Secondary | ICD-10-CM | POA: Diagnosis not present

## 2013-12-04 DIAGNOSIS — M51379 Other intervertebral disc degeneration, lumbosacral region without mention of lumbar back pain or lower extremity pain: Secondary | ICD-10-CM

## 2013-12-04 DIAGNOSIS — M5137 Other intervertebral disc degeneration, lumbosacral region: Secondary | ICD-10-CM | POA: Diagnosis not present

## 2013-12-04 DIAGNOSIS — M159 Polyosteoarthritis, unspecified: Secondary | ICD-10-CM | POA: Diagnosis not present

## 2013-12-04 DIAGNOSIS — M1610 Unilateral primary osteoarthritis, unspecified hip: Secondary | ICD-10-CM | POA: Diagnosis not present

## 2013-12-04 NOTE — Assessment & Plan Note (Signed)
Patient clearly has lumbar degenerative problems but the question is does she have some nerve root entrapment leading to the profound weakness in her hip abduction  Since she did not respond to physical therapy and conservative care I think we should move ahead with an MRI  We will obtain that and consider giving her a trial of gabapentin

## 2013-12-04 NOTE — Addendum Note (Signed)
Addended by: Cyd Silence on: 12/04/2013 05:38 PM   Modules accepted: Orders

## 2013-12-04 NOTE — Progress Notes (Signed)
Subjective:     Patient ID: [name], female   DOB: [birth year], 68 y.o.   MRN: [id number]  HPI [name] is a 68 y.o. female with bilateral lateral hip and buttock pain. Patient presents the [facility] today for follow up. She states she has continued her vitamin D supplement and finished 5 weeks of PT without benefit. She feels that her pain is the same and she has progressed in her weakness. She now experiences muscle weakness that makes her right hip "give out" on her. She has pain with walking and stairs. The pain is better with rest. She experiences "stiffness" with sitting, but her pain is relieved with sitting. She has been tried off statin medications without improvement and is now back on them. She has attempted celebrex with little benefit, but her cardiologist recommended against it. Bilateral Hip xray in 2010 resulted with mild arthritis. Patient has had lumbar "buldging" disc issues in the past.   Review of prior labs show normal TSH. Hematuria work up with urologist was negative for pathology.  Review of Systems Negative, with the exception of above mentioned in HPI     Objective:   Physical Exam BP 137/101  Pulse 94  Ht 5\' 4"  (1.626 m)  Wt 166 lb (75.297 kg)  BMI 28.48 kg/m2 Gen: Well developed, well nourished caucasian female. No acute distress.  Bilateral Hip: TTP over left greater trochanter, Good hip ROM bilaterally, Straight leg raise neg bilat, FABER positive on rt in hip, Extremely weak hip abduction bilat, Hip flexors, hamstrings and adduction strong.      Assessment/Plan     [name] is a 68 y.o. female with bilateral hip pain and abduction weakness concerning for neuromuscular disease or nerve impingement (spinal stenosis) lumbar/sacral spine.  - Continue Vitamin D - Xrays of lumbar spine today.  - We will call patient with results of xrays and likely need MRI. At that time will decide on follow up and treatment plan. Would consider starting  gabapentin if her pain/weakness appears to be nerve related.

## 2013-12-06 ENCOUNTER — Ambulatory Visit
Admission: RE | Admit: 2013-12-06 | Discharge: 2013-12-06 | Disposition: A | Discharge status: Home / Self Care | Payer: Medicare Other | Source: Ambulatory Visit | Attending: Sports Medicine | Admitting: Sports Medicine

## 2013-12-06 DIAGNOSIS — M47817 Spondylosis without myelopathy or radiculopathy, lumbosacral region: Secondary | ICD-10-CM | POA: Diagnosis not present

## 2013-12-06 DIAGNOSIS — M5136 Other intervertebral disc degeneration, lumbar region: Secondary | ICD-10-CM

## 2013-12-06 DIAGNOSIS — M25552 Pain in left hip: Principal | ICD-10-CM

## 2013-12-06 DIAGNOSIS — M5137 Other intervertebral disc degeneration, lumbosacral region: Secondary | ICD-10-CM | POA: Diagnosis not present

## 2013-12-06 DIAGNOSIS — M25551 Pain in right hip: Secondary | ICD-10-CM

## 2013-12-09 ENCOUNTER — Other Ambulatory Visit: Payer: Self-pay | Admitting: Dermatology

## 2013-12-09 DIAGNOSIS — D235 Other benign neoplasm of skin of trunk: Secondary | ICD-10-CM | POA: Diagnosis not present

## 2013-12-09 DIAGNOSIS — D485 Neoplasm of uncertain behavior of skin: Secondary | ICD-10-CM | POA: Diagnosis not present

## 2013-12-09 DIAGNOSIS — D239 Other benign neoplasm of skin, unspecified: Secondary | ICD-10-CM | POA: Diagnosis not present

## 2013-12-12 ENCOUNTER — Other Ambulatory Visit: Payer: Self-pay | Admitting: Sports Medicine

## 2013-12-12 MED ORDER — GABAPENTIN 300 MG PO CAPS: 300.0000 mg | ORAL_CAPSULE | Freq: Every day | ORAL | Status: DC

## 2013-12-18 DIAGNOSIS — N318 Other neuromuscular dysfunction of bladder: Secondary | ICD-10-CM | POA: Diagnosis not present

## 2013-12-30 ENCOUNTER — Other Ambulatory Visit: Payer: Self-pay | Admitting: Internal Medicine

## 2014-01-09 ENCOUNTER — Telehealth: Payer: Self-pay | Admitting: *Deleted

## 2014-01-09 MED ORDER — GABAPENTIN 300 MG PO CAPS: ORAL_CAPSULE | ORAL | Status: DC

## 2014-01-09 NOTE — Telephone Encounter (Signed)
Per dr fields  She is to increase gabapentin to 600mg  qhs and keep up posted on the efficacy.   Pt agrees

## 2014-01-14 ENCOUNTER — Ambulatory Visit: Payer: Medicare Other | Admitting: Family Medicine

## 2014-01-28 ENCOUNTER — Other Ambulatory Visit: Payer: Self-pay | Admitting: *Deleted

## 2014-01-28 MED ORDER — GABAPENTIN 300 MG PO CAPS: ORAL_CAPSULE | ORAL | Status: DC

## 2014-01-31 DIAGNOSIS — M542 Cervicalgia: Secondary | ICD-10-CM | POA: Diagnosis not present

## 2014-02-19 DIAGNOSIS — Z01419 Encounter for gynecological examination (general) (routine) without abnormal findings: Secondary | ICD-10-CM | POA: Diagnosis not present

## 2014-03-05 ENCOUNTER — Ambulatory Visit: Payer: Medicare Other | Admitting: Sports Medicine

## 2014-03-17 ENCOUNTER — Encounter: Payer: Self-pay | Admitting: Sports Medicine

## 2014-03-17 ENCOUNTER — Ambulatory Visit (INDEPENDENT_AMBULATORY_CARE_PROVIDER_SITE_OTHER): Payer: Medicare Other | Admitting: Sports Medicine

## 2014-03-17 VITALS — BP 125/79 | HR 116 | Ht 64.0 in | Wt 166.0 lb

## 2014-03-17 DIAGNOSIS — M159 Polyosteoarthritis, unspecified: Secondary | ICD-10-CM | POA: Diagnosis not present

## 2014-03-17 DIAGNOSIS — M25559 Pain in unspecified hip: Secondary | ICD-10-CM | POA: Diagnosis not present

## 2014-03-17 DIAGNOSIS — M76899 Other specified enthesopathies of unspecified lower limb, excluding foot: Secondary | ICD-10-CM | POA: Diagnosis not present

## 2014-03-17 DIAGNOSIS — G8929 Other chronic pain: Secondary | ICD-10-CM

## 2014-03-17 DIAGNOSIS — M1610 Unilateral primary osteoarthritis, unspecified hip: Secondary | ICD-10-CM | POA: Diagnosis not present

## 2014-03-17 DIAGNOSIS — M5137 Other intervertebral disc degeneration, lumbosacral region: Secondary | ICD-10-CM | POA: Diagnosis not present

## 2014-03-17 DIAGNOSIS — M7062 Trochanteric bursitis, left hip: Secondary | ICD-10-CM

## 2014-03-17 MED ORDER — PREDNISONE 20 MG PO TABS: ORAL_TABLET | ORAL | Status: DC

## 2014-03-17 NOTE — Progress Notes (Signed)
  [name] - 68 y.o. female MRN [id number]  Date of birth: 07-04-1946  SUBJECTIVE:  Including CC & ROS.  Patient is a 68 year old female following up for lateral hip and buttocks pain. Previously patient had an MRI of the lumbar spine that did show some stenosis from disc herniation at the L3-L4 with advanced degenerative disease and bulging disc annular changes at L4-L5 and L5-S1 (MRI April 2015). Patient was started on gabapentin hopefully help improve her suspected neuropathic type pain. She reports no clinical improvement, gabapentin increase was not providing any clinical improvement cause significant sleepiness and foggy sensation. The increase fatigue made it difficulty to tolerate inpatient discontinue medication. She currently denies any radicular or neuropathic type radiating pain. Most of her pain in lateral buttock with both sides over the greater trochanter. Finished course of PT several months ago. History of several injection to great trochanter several year ago lasted 3 months and pain returned. Has work on home exercise and strengthen improved but no pain improvement.    ROS: Review of systems otherwise negative except for information present in HPI  HISTORY: Past Medical, Surgical, Social, and Family History Reviewed & Updated per EMR. Pertinent Historical Findings include: History significant for degenerative lumbar disc disease, greater trochanter bursitis, hyperlipidemia, hypertension, A. fib and osteopenia  DATA REVIEWED: Personally reviewed patient's MRI from March 2015 see assessment above  PHYSICAL EXAM:  VS: BP:125/79 mmHg  HR:116bpm  TEMP: ( )  RESP:   HT:5\' 4"  (162.6 cm)   WT:166 lb (75.297 kg)  BMI:28.6 PHYSICAL EXAM: Gen: Well developed, well nourished caucasian female. No acute distress.  Bilateral Hip: TTP over left greater trochanter, Good hip ROM bilaterally, Straight leg raise neg bilat, FABER positive on rt in hip, Extremely weak hip abduction bilat,  Hip flexors, hamstrings and adduction strong.   MSK [place]: Ultrasound of the greater trochanter shows a fairly significant bursal sac enlargement under the gluteus medius and piriformis particularly larger on the right compared to the left.  ASSESSMENT & PLAN: See problem based charting & AVS for pt instructions.

## 2014-03-17 NOTE — Assessment & Plan Note (Addendum)
Discuss with patient that I am concerned that bilateral greater trochanter bursitis is causing her continual pain. She not having very much neuropathic pain at this point. Although she does have some DJD and disc bulge in her back she's not having any radicular symptoms that would warrant further intervention in the lumbar spine. Advised her she canstop the gabapentin that this is not providing much relief and it's causing drowsiness.  recommended trying a course of prednisone orally to help with both bilateral greater trochanter bursitis. After 2 weeks if she is  noticing clinical improvement in her pain control of her bursitis we could consider referring her to orthopedic surgery for consideration of bursal removal given that this is a chronic long-standing problem that has been unrelenting and unresolving with injections, physical therapy, and oral steroids

## 2014-03-28 ENCOUNTER — Other Ambulatory Visit: Payer: Self-pay | Admitting: Internal Medicine

## 2014-04-02 ENCOUNTER — Ambulatory Visit (INDEPENDENT_AMBULATORY_CARE_PROVIDER_SITE_OTHER): Payer: Medicare Other | Admitting: Internal Medicine

## 2014-04-02 ENCOUNTER — Other Ambulatory Visit (INDEPENDENT_AMBULATORY_CARE_PROVIDER_SITE_OTHER): Payer: Medicare Other

## 2014-04-02 ENCOUNTER — Encounter: Payer: Self-pay | Admitting: Internal Medicine

## 2014-04-02 VITALS — BP 148/88 | HR 95 | Temp 98.2°F | Wt 175.5 lb

## 2014-04-02 DIAGNOSIS — R351 Nocturia: Secondary | ICD-10-CM

## 2014-04-02 DIAGNOSIS — I4891 Unspecified atrial fibrillation: Secondary | ICD-10-CM

## 2014-04-02 DIAGNOSIS — R632 Polyphagia: Secondary | ICD-10-CM | POA: Diagnosis not present

## 2014-04-02 DIAGNOSIS — F4323 Adjustment disorder with mixed anxiety and depressed mood: Secondary | ICD-10-CM

## 2014-04-02 DIAGNOSIS — I482 Chronic atrial fibrillation, unspecified: Secondary | ICD-10-CM

## 2014-04-02 DIAGNOSIS — G479 Sleep disorder, unspecified: Secondary | ICD-10-CM | POA: Diagnosis not present

## 2014-04-02 LAB — CBC WITH DIFFERENTIAL/PLATELET
BASOS PCT: 0.5 % (ref 0.0–3.0)
Basophils Absolute: 0.1 10*3/uL (ref 0.0–0.1)
EOS ABS: 0.1 10*3/uL (ref 0.0–0.7)
EOS PCT: 0.9 % (ref 0.0–5.0)
HEMATOCRIT: 45.4 % (ref 36.0–46.0)
HEMOGLOBIN: 15.4 g/dL — AB (ref 12.0–15.0)
LYMPHS ABS: 2.8 10*3/uL (ref 0.7–4.0)
Lymphocytes Relative: 21.7 % (ref 12.0–46.0)
MCHC: 33.9 g/dL (ref 30.0–36.0)
MCV: 91.4 fl (ref 78.0–100.0)
MONO ABS: 1 10*3/uL (ref 0.1–1.0)
Monocytes Relative: 7.9 % (ref 3.0–12.0)
NEUTROS ABS: 8.9 10*3/uL — AB (ref 1.4–7.7)
Neutrophils Relative %: 69 % (ref 43.0–77.0)
Platelets: 236 10*3/uL (ref 150.0–400.0)
RBC: 4.97 Mil/uL (ref 3.87–5.11)
RDW: 14.6 % (ref 11.5–15.5)
WBC: 12.9 10*3/uL — AB (ref 4.0–10.5)

## 2014-04-02 LAB — BASIC METABOLIC PANEL
BUN: 13 mg/dL (ref 6–23)
CHLORIDE: 104 meq/L (ref 96–112)
CO2: 29 mEq/L (ref 19–32)
Calcium: 8.9 mg/dL (ref 8.4–10.5)
Creatinine, Ser: 0.7 mg/dL (ref 0.4–1.2)
GFR: 90.17 mL/min (ref >60.00)
Glucose, Bld: 73 mg/dL (ref 70–99)
POTASSIUM: 3.5 meq/L (ref 3.5–5.1)
SODIUM: 140 meq/L (ref 135–145)

## 2014-04-02 LAB — TSH: TSH: 1.15 u[IU]/mL (ref 0.35–4.50)

## 2014-04-02 LAB — HEMOGLOBIN A1C: Hgb A1c MFr Bld: 5.9 % (ref 4.6–6.5)

## 2014-04-02 MED ORDER — SERTRALINE HCL 50 MG PO TABS: 50.0000 mg | ORAL_TABLET | Freq: Every day | ORAL | Status: DC

## 2014-04-02 NOTE — Patient Instructions (Signed)
As we discussed the neurotransmitters are essential for good brain function, both intellectually & emotionally. The agents to increase the neurotransmitter levels are not addictive and simply keep this essential neurotransmitter at therapeutic levels. If these levels become severely depleted; depression or panic attacks can occur.    Your next office appointment will be determined based upon review of your pending labs . Those instructions will be transmitted to you through My Chart . Followup as needed for your acute issue. Please report any significant change in your symptoms.

## 2014-04-02 NOTE — Progress Notes (Signed)
Pre visit review using our clinic review tool, if applicable. No additional management support is needed unless otherwise documented below in the visit note. 

## 2014-04-02 NOTE — Progress Notes (Signed)
   Subjective:    Patient ID: [name], female    DOB: September 05, 1945, 68 y.o.   MRN: [id number]  HPI  She reports feeling shaky and weak multiple times throughout the day for 6 to 8 weeks.  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  She is helping plan and prepare for her son's wedding in two weeks.  She has chronic a-fib. TSH 1.10 in 10/14. CMET WNL 11/14 except for GFR of 88.  Review of Systems She reports general malaise.  She denies being down, depressed, or hopeless.  She reports lack of motivation to do daily activities.   She reports excessive appetite without satisfaction after a meal.   Frequent BM x5-6 per day.   She has difficulty staying asleep, waking with racing thoughts and frequent nocturia.  She denies panic, unexplained heart rate irregularity (see AF hx above).  She has just completed a two week course of prednisone for bursitis.       Objective:   Physical Exam   Pertinent were positive findings include: Facies somewhat weathered She has unsustained vertical nystagmus on EOM testing  without associated lid lag or proptosis. Heart rate is controlled but heart rhythm is irregular. She has mixed PIP and DIP joint changes  General appearance :adequately nourished; in no distress. Eyes: No conjunctival inflammation or scleral icterus is present. Oral exam: Dental hygiene is good. Lips and gums are healthy appearing.There is no oropharyngeal erythema or exudate noted.  Heart:  No gallop, murmur, click, rub or other extra sounds   Lungs:Chest clear to auscultation; no wheezes, rhonchi,rales ,or rubs present.No increased work of breathing.  Abdomen: bowel sounds normal, soft and non-tender without masses, organomegaly or hernias noted.  No guarding or rebound. No flank tenderness to percussion. Skin:Warm & dry.  Intact without suspicious lesions or rashes ; no jaundice or tenting Lymphatic: No lymphadenopathy is noted about the  head, neck, axilla Neuro : no tremor . Strength , tone & DTRs WNL.            Assessment & Plan:  #1 situational anxiety with anhedonia  #2 nocturia & polyphagia in context of recent steroid course  #3 atrial fibrillation  #4 sleep disorder  See orders and recommendations. The pathophysiology of neurotransmitter deficiency was discussed along with the benefits and potential adverse effects of SSRI therapy.

## 2014-05-22 ENCOUNTER — Encounter: Payer: Self-pay | Admitting: Family

## 2014-05-22 ENCOUNTER — Other Ambulatory Visit: Payer: Self-pay

## 2014-05-22 ENCOUNTER — Ambulatory Visit (INDEPENDENT_AMBULATORY_CARE_PROVIDER_SITE_OTHER): Payer: Medicare Other | Admitting: Family

## 2014-05-22 VITALS — BP 148/90 | HR 88 | Temp 97.6°F | Resp 18 | Ht 64.0 in | Wt 171.8 lb

## 2014-05-22 DIAGNOSIS — M6248 Contracture of muscle, other site: Secondary | ICD-10-CM | POA: Diagnosis not present

## 2014-05-22 DIAGNOSIS — M62838 Other muscle spasm: Secondary | ICD-10-CM

## 2014-05-22 MED ORDER — CYCLOBENZAPRINE HCL 5 MG PO TABS: 5.0000 mg | ORAL_TABLET | Freq: Three times a day (TID) | ORAL | Status: DC | PRN

## 2014-05-22 MED ORDER — METHYLPREDNISOLONE (PAK) 4 MG PO TABS: ORAL_TABLET | ORAL | Status: DC

## 2014-05-22 NOTE — Progress Notes (Signed)
Pre visit review using our clinic review tool, if applicable. No additional management support is needed unless otherwise documented below in the visit note. 

## 2014-05-22 NOTE — Assessment & Plan Note (Signed)
Symptoms consistent with trapezius muscle spasm. Start Flexeril and Medrol dose pack. Work on flexion ROM of the neck. Follow up if symptoms worsen or fail to improve.

## 2014-05-22 NOTE — Progress Notes (Signed)
   Subjective:    Patient ID: [name], female    DOB: May 03, 1946, 68 y.o.   MRN: [id number]  HPI:  [name] is a 68 y.o. female who presents today for neck pain.   Previous history of neck stiffness. This started last night when she woke up in the middle of the night. Over the course of the night the stiffness became uncomfortable and had to sit up in a chair.  Took a shower this morning and it felt better for a little while and was able to move better, then symptoms returned. Attempted an electronic heating pad afterward which provided little relief. Denies any fever, trauma or numbness/tingling in her upper extremities.   No Known Allergies  Current Outpatient Prescriptions on File Prior to Visit  Medication Sig Dispense Refill  . cholecalciferol (VITAMIN D) 1000 UNITS tablet Take 2,000 Units by mouth daily.      [name] diltiazem (CARDIZEM CD) 240 MG 24 hr capsule Take 1 capsule by mouth  daily  90 capsule  0  . diltiazem (CARDIZEM) 30 MG tablet TAKE 1 TABLET AS NEEDED FOR AFIB  30 tablet  0  . hydrochlorothiazide (HYDRODIURIL) 25 MG tablet Take 1 tablet by mouth  daily  90 tablet  1  . lisinopril (PRINIVIL,ZESTRIL) 40 MG tablet Take 1 tablet by mouth  daily  90 tablet  3  . PRADAXA 150 MG CAPS capsule Take 1 capsule by mouth  every 12 hours  60 capsule  6  . pravastatin (PRAVACHOL) 20 MG tablet Take 1 tablet by mouth  every day  90 tablet  1  . sertraline (ZOLOFT) 50 MG tablet Take 1 tablet (50 mg total) by mouth daily. 1/2 qd X 10 days then 1 qd  30 tablet  2  . traMADol (ULTRAM) 50 MG tablet Take 1 tablet (50 mg total) by mouth every 6 (six) hours as needed.  120 tablet  1   No current facility-administered medications on file prior to visit.    Review of Systems  See HPI    Objective:    BP 148/90  Pulse 88  Temp(Src) 97.6 F (36.4 C) (Oral)  Resp 18  Ht 5\' 4"  (1.626 m)  Wt 171 lb 12.8 oz (77.928 kg)  BMI 29.47 kg/m2  SpO2 97% Nursing note and vital signs  reviewed.  Physical Exam  Constitutional: She is oriented to person, place, and time. She appears well-developed and well-nourished.  Neck: Neck supple.  Cardiovascular: Normal rate, regular rhythm and normal heart sounds.   Pulmonary/Chest: Effort normal and breath sounds normal.  Musculoskeletal:  No obvious deformity, edema or discoloration noted. Neck position is slightly laterally flexed to the right. Decreased neck flexion, extension and left lateral rotation. Palpable tenderness over upper and middle trapezius.   Neurological: She is alert and oriented to person, place, and time.  Skin: Skin is warm and dry.  Psychiatric: She has a normal mood and affect. Her behavior is normal. Judgment and thought content normal.        Assessment & Plan:

## 2014-05-22 NOTE — Patient Instructions (Signed)
Thank you for choosing [facility].  Summary/Instructions:   Neck - Please start your prescriptions that were sent to the pharmacy.  Apply warm heat to the area 2-3 per day for 20 minutes and stretch the neck muscles.  Please follow up if symptoms worsen or fail to improve.

## 2014-06-27 ENCOUNTER — Other Ambulatory Visit: Payer: Self-pay | Admitting: Internal Medicine

## 2014-06-29 ENCOUNTER — Other Ambulatory Visit: Payer: Self-pay

## 2014-06-29 MED ORDER — DILTIAZEM HCL ER COATED BEADS 240 MG PO CP24: ORAL_CAPSULE | ORAL | Status: DC

## 2014-06-29 MED ORDER — DABIGATRAN ETEXILATE MESYLATE 150 MG PO CAPS: ORAL_CAPSULE | ORAL | Status: DC

## 2014-07-17 ENCOUNTER — Encounter: Payer: Self-pay | Admitting: *Deleted

## 2014-07-17 ENCOUNTER — Other Ambulatory Visit: Payer: Self-pay | Admitting: *Deleted

## 2014-07-17 MED ORDER — PREDNISONE 20 MG PO TABS: ORAL_TABLET | ORAL | Status: DC

## 2014-07-20 ENCOUNTER — Other Ambulatory Visit: Payer: Self-pay | Admitting: *Deleted

## 2014-07-20 MED ORDER — PREDNISONE 20 MG PO TABS: ORAL_TABLET | ORAL | Status: DC

## 2014-08-11 ENCOUNTER — Other Ambulatory Visit (HOSPITAL_COMMUNITY): Payer: Self-pay | Admitting: Obstetrics & Gynecology

## 2014-08-11 DIAGNOSIS — Z1231 Encounter for screening mammogram for malignant neoplasm of breast: Secondary | ICD-10-CM

## 2014-08-13 ENCOUNTER — Ambulatory Visit (INDEPENDENT_AMBULATORY_CARE_PROVIDER_SITE_OTHER): Payer: Medicare Other | Admitting: Sports Medicine

## 2014-08-13 ENCOUNTER — Encounter: Payer: Self-pay | Admitting: Sports Medicine

## 2014-08-13 VITALS — BP 99/66 | HR 96 | Ht 64.0 in | Wt 170.0 lb

## 2014-08-13 DIAGNOSIS — M25552 Pain in left hip: Secondary | ICD-10-CM | POA: Diagnosis not present

## 2014-08-13 DIAGNOSIS — M25551 Pain in right hip: Secondary | ICD-10-CM | POA: Diagnosis not present

## 2014-08-13 LAB — URIC ACID: Uric Acid, Serum: 7.7 mg/dL — ABNORMAL HIGH (ref 2.4–7.0)

## 2014-08-13 MED ORDER — TRAMADOL HCL 50 MG PO TABS: 50.0000 mg | ORAL_TABLET | Freq: Four times a day (QID) | ORAL | Status: DC | PRN

## 2014-08-13 MED ORDER — AMITRIPTYLINE HCL 25 MG PO TABS: 25.0000 mg | ORAL_TABLET | Freq: Every day | ORAL | Status: DC

## 2014-08-13 NOTE — Progress Notes (Signed)
  FLORAINE BUECHLER - 69 y.o. female MRN [id number]  Date of birth: 1945/10/15  SUBJECTIVE:  Patient is a 69 year old female with arthritis, degenerative lumbar disc disease, and greater trochanter bursitis who presents today with continued bilateral hip pain. The pain is greatest along the lateral aspects of her hip and greater on the right hip than left. This pain will wake her up from sleep and she can't get comfortable due to the pain. She was trialled on prednisone in December, which she took for about 11 days. She states that the prednisone did help her pain and she stopped it when she was feeling better.   Note she started having the RT hip pain in 2010.  Multiple interventions have failed to make much change.  She also complains of arthritic pain in her back, hands, and now tailbone, which was helped by the prednisone as well. She is unable to take NSAIDs for her pain due to being on blood thinner. She tried Tramadol in the past, but it didn't seem to help.   ROS: Review of systems otherwise negative except for information present in HPI  Fam Hx:  Fatehr had gout  PHYSICAL EXAM:  VS: BP:99/66 mmHg  HR:96bpm  TEMP: ( )  RESP:   HT:5\' 4"  (162.6 cm)   WT:170 lb (77.111 kg)  BMI:29.2 PHYSICAL EXAM: Gen: Well-appearing, well-developed caucasian female. No acute distress.  MSK: tenderness to palpation over right greater trochanter, but not over left. Good ROM of pelvis. Good ROM of hips bilaterally (approx 70 degrees bilaterally, about 5 degrees less on the left), Straight leg raise neg bilat, with >45 degree passive extension. Extremely weak (3/5) hip abduction on right, with no resistance to passive movement. Good strength (5/5) of abduction of the left hip.  Gets pain with RT testing/ mild pain w left testing.  [place] of RT hip large hypoechoic bursal swelling at Grt troch.  Most under Glut med tendon.  Also noted hypoechoic change beneath piriformis tendon  On Left Glut med distal tendon  above GRT troch there is a linear bursal swelling noted   ASSESSMENT & PLAN:  1. Bilateral hip pain- continues to have bilateral pain at greater trochanter, greater on right than left. U/S shows likely bursitis. - traMADol (ULTRAM) 50 MG tablet; Take 1 tablet (50 mg total) by mouth every 6 (six) hours as needed.  Dispense: 120 tablet; Refill: 1 - amitriptyline (ELAVIL) 25 MG tablet; Take 1 tablet (25 mg total) by mouth at bedtime.  Dispense: 30 tablet; Refill: 1 - will check uric acid level today due to bilateral greater trochanter bursitis as potentially being secondary to gout  Patient was seen and discussed with my attending, Dr. Oneida Alar.  Freddrick March, MD, PGY-1 08/13/2014  12:03 PM   Reviewed and agree with exam and doucmnetation.  Ila Mcgill, MD

## 2014-08-13 NOTE — Patient Instructions (Signed)
1. Begin taking 25 mg (1 tablet) of amitryptiline nightly. 2. Resume tramadol every 6 hours as needed for pain. 3. Return to clinic in 6 months or sooner as needed.

## 2014-08-13 NOTE — Assessment & Plan Note (Signed)
This has been chronic with no real response to most therapeutic interventions.  Oral steroids have helped on 2 occassions.  With this being bilateral I have a concern that there could be some other process and will screen for gout.  Reck 1 month

## 2014-08-20 ENCOUNTER — Other Ambulatory Visit: Payer: Self-pay | Admitting: *Deleted

## 2014-08-20 ENCOUNTER — Ambulatory Visit (INDEPENDENT_AMBULATORY_CARE_PROVIDER_SITE_OTHER): Payer: Medicare Other | Admitting: Sports Medicine

## 2014-08-20 ENCOUNTER — Encounter: Payer: Self-pay | Admitting: Sports Medicine

## 2014-08-20 VITALS — BP 114/45 | Ht 64.0 in | Wt 170.0 lb

## 2014-08-20 DIAGNOSIS — M25552 Pain in left hip: Secondary | ICD-10-CM | POA: Diagnosis not present

## 2014-08-20 DIAGNOSIS — M25551 Pain in right hip: Secondary | ICD-10-CM

## 2014-08-20 MED ORDER — COLCHICINE 0.6 MG PO TABS: 0.6000 mg | ORAL_TABLET | Freq: Two times a day (BID) | ORAL | Status: DC

## 2014-08-20 MED ORDER — DABIGATRAN ETEXILATE MESYLATE 150 MG PO CAPS: ORAL_CAPSULE | ORAL | Status: DC

## 2014-08-20 NOTE — Assessment & Plan Note (Signed)
Suspicious for Gout  Will use Colchicine and monitor for change

## 2014-08-20 NOTE — Progress Notes (Signed)
   Subjective:    Patient ID: [name], female    DOB: [birth year], 69 y.o.   MRN: [id number]  HPI [name] is a 68 yo female who presents for follow-up of chronic, bilateral hip pain that is worse on the right. She was last seen in the [facility] on 08/13/14.   [name] reports having hip pain on the right since at least 2010, and since that time the pain has grown to involve her left hip as well. The pain is described as being located over the greater trochanter region bilaterally. She has tried trials of oral steroids in the past, and has noticed an improvement in her symptoms. The pain often returns once steroids are completed. She has not been able to take any NSAIDs due to being on Pradexa. Family history is notable for gout in her father, involving his knees. During her last visit she had lab work done including a uric acid level, which came back elevated at 7.7. Over the past week, [name] does not report any changes in her previous symptoms and presents to clinic today to discuss treatment options going forward.  No lower extremity numbness or tingling.    Review of Systems Negative other than noted in HPI.     Objective:   Physical Exam Vitals: BP 114/45 General: well-appearing, pleasant female in no acute distress  Right Hip:  Inspection: no asymmetry or deformity Palpation: tender to palpation over the greater trochanter ROM: full ROM with flexion, extension, abduction, adduction, internal and external rotation Strength: 5/5 with flexion and extension Special testing: neg FABER, neg FADIR, neg log-roll Neurovascular: neurovascularly intact distally  Left Hip:  Inspection: no asymmetry or deformity Palpation: tender to palpation over the greater trochanter ROM: full ROM with flexion, extension, abduction, adduction, internal and external rotation Strength: 5/5 with flexion and extension Special testing: neg FABER, neg FADIR, neg log-roll Neurovascular:  neurovascularly intact distally     Assessment & Plan:   1. Bilateral hip pain Given [name]'s bilateral hip pain primarily focused over the greater trochanter in the setting of a family history of gout involving large joints, an improvement in symptoms with oral steroids, and an elevated uric acid level there is concern that this pain is due to gout. - particularly since she has bursal swelling bilaterally  - Will start treatment with 0.6mg  Colchicine once daily x 1week to monitor for drug reaction with concurrent diltiazem and rosuvastatin. If tolerated well, increase to 0.6mg  Colchicine twice daily.  - Continue amitriptyline at bedtime since it seems to be helping with night time rest - Will follow-up in 1 month. If symptoms are not improved, consider re-scanning with ultrasound for further evaluation.    This patient was seen with and note was dictated by Crissie Sickles, MS4.   Agree with plan  Ila Mcgill, MD

## 2014-08-31 ENCOUNTER — Ambulatory Visit (INDEPENDENT_AMBULATORY_CARE_PROVIDER_SITE_OTHER): Payer: Medicare Other | Admitting: Internal Medicine

## 2014-08-31 ENCOUNTER — Encounter: Payer: Self-pay | Admitting: Internal Medicine

## 2014-08-31 VITALS — BP 132/66 | HR 111 | Ht 64.0 in | Wt 173.0 lb

## 2014-08-31 DIAGNOSIS — I4891 Unspecified atrial fibrillation: Secondary | ICD-10-CM

## 2014-08-31 DIAGNOSIS — I10 Essential (primary) hypertension: Secondary | ICD-10-CM

## 2014-08-31 MED ORDER — DILTIAZEM HCL ER COATED BEADS 360 MG PO CP24: ORAL_CAPSULE | ORAL | Status: DC

## 2014-08-31 NOTE — Patient Instructions (Signed)
Your physician wants you to follow-up in: 6 months with Roderic Palau, NP and 12 months with Dr. Vallery Ridge will receive a reminder letter in the mail two months in advance. If you don't receive a letter, please call our office to schedule the follow-up appointment.  Your physician has requested that you have an echocardiogram. Echocardiography is a painless test that uses sound waves to create images of your heart. It provides your doctor with information about the size and shape of your heart and how well your heart's chambers and valves are working. This procedure takes approximately one hour. There are no restrictions for this procedure   Your physician has recommended you make the following change in your medication:  1) Increase Cardizem CD to 360mg  daily

## 2014-08-31 NOTE — Progress Notes (Signed)
Electrophysiology Office Note   Date:  08/31/2014   ID:  [name], [name] 01/24/1946, MRN [id number]  PCP:  Gwendolyn Grant, MD   Primary Electrophysiologist:  Thompson Grayer, MD    Chief Complaint  Patient presents with  . Follow-up    AFIB     History of Present Illness: [name] is a 69 y.o. female who presents today for electrophysiology evaluation.   She has done well since I saw her last.  She rarely requires her prn diltiazem.  She denies issues with pradaxa.  She remains active and feels that her exercise tolerance is preserved.  Today, she denies symptoms of palpitations, chest pain, shortness of breath, orthopnea, PND, lower extremity edema, claudication, dizziness, presyncope, syncope, bleeding, or neurologic sequela. The patient is tolerating medications without difficulties and is otherwise without complaint today.    Past Medical History  Diagnosis Date  . Mixed hyperlipidemia   . MITRAL VALVE PROLAPSE   . HYPERTENSION   . Atrial fibrillation     chronic anticoag - Pradaxa  . BURSITIS, SUBTROCHANTERIC   . ARTHRITIS, HIPS, BILATERAL    Past Surgical History  Procedure Laterality Date  . Tonsillectomy    . Dilation and curettage of uterus       Current Outpatient Prescriptions  Medication Sig Dispense Refill  . amitriptyline (ELAVIL) 25 MG tablet Take 1 tablet (25 mg total) by mouth at bedtime. 30 tablet 1  . cholecalciferol (VITAMIN D) 1000 UNITS tablet Take 2,000 Units by mouth daily.    . colchicine 0.6 MG tablet Take 1 tablet (0.6 mg total) by mouth 2 (two) times daily. 60 tablet 3  . dabigatran (PRADAXA) 150 MG CAPS capsule Take 1 capsule by mouth  every 12 hours 180 capsule 0  . diltiazem (CARDIZEM CD) 240 MG 24 hr capsule Take 1 capsule by mouth  daily 90 capsule 0  . diltiazem (CARDIZEM) 30 MG tablet TAKE 1 TABLET AS NEEDED FOR AFIB (Patient taking differently: TAKE 1 TABLET BY MOUTH DAILY  AS NEEDED FOR AFIB) 30 tablet 0  .  hydrochlorothiazide (HYDRODIURIL) 25 MG tablet Take 1 tablet by mouth  daily 90 tablet 1  . lisinopril (PRINIVIL,ZESTRIL) 40 MG tablet Take 1 tablet by mouth  daily 90 tablet 3  . pravastatin (PRAVACHOL) 20 MG tablet Take 1 tablet by mouth  every day 90 tablet 1  . traMADol (ULTRAM) 50 MG tablet Take 1 tablet (50 mg total) by mouth every 6 (six) hours as needed. (Patient taking differently: Take 50 mg by mouth every 6 (six) hours as needed (pain). ) 120 tablet 1   No current facility-administered medications for this visit.    Allergies:   Review of patient's allergies indicates no known allergies.   Social History:  The patient  reports that she has never smoked. She does not have any smokeless tobacco history on file. She reports that she uses illicit drugs. She reports that she does not drink alcohol.   Family History:  The patient's family history includes Heart failure in her mother; Hypertension in her mother; Leukemia in her father.    ROS:  Please see the history of present illness.    All other systems are reviewed and negative.    PHYSICAL EXAM: VS:  BP 132/66 mmHg  Pulse 111  Ht 5\' 4"  (1.626 m)  Wt 173 lb (78.472 kg)  BMI 29.68 kg/m2 , BMI Body mass index is 29.68 kg/(m^2). GEN: Well nourished, well developed, in no  acute distress HEENT: normal Neck: no JVD, carotid bruits, or masses Cardiac: iRRR; no murmurs, rubs, or gallops,no edema  Respiratory:  clear to auscultation bilaterally, normal work of breathing GI: soft, nontender, nondistended, + BS MS: no deformity or atrophy Skin: warm and dry, Neuro:  Strength and sensation are intact Psych: euthymic mood, full affect  EKG:  EKG is ordered today. The ekg ordered today shows afib with V rate 111 bpm, nonspecific St/T changse    Recent Labs: 11/25/2013: ALT 11 04/02/2014: BUN 13; Creatinine 0.7; Hemoglobin 15.4*; Platelets 236.0; Potassium 3.5; Sodium 140; TSH 1.15    Lipid Panel     Component Value Date/Time    CHOL 219* 11/25/2013 0818   TRIG 115.0 11/25/2013 0818   HDL 57.80 11/25/2013 0818   CHOLHDL 4 11/25/2013 0818   VLDL 23.0 11/25/2013 0818   LDLCALC 138* 11/25/2013 0818   LDLDIRECT 149.3 09/25/2008 0950     Wt Readings from Last 3 Encounters:  08/31/14 173 lb (78.472 kg)  08/20/14 170 lb (77.111 kg)  08/13/14 170 lb (77.111 kg)      Other studies Reviewed: Additional studies/ records that were reviewed today include: echo 2014  Review of the above records today demonstrates: preserved EF at that time   ASSESSMENT AND PLAN:  1.  afib Asymptomatic persistent afib, longstanding chads2vasc score is at least 3.  Continue pradaxa.  Labs reviewed today Increase diltiazem CD to 360mg  daily for better rate control and continue as needed short activing cardizem Echo to evaluate for any structual changes related to AF  2. HTN Stable No change required today  Return to see Roderic Palau NP in 6 months I will see in a year    Current medicines are reviewed at length with the patient today.   The patient does not have concerns regarding her medicines.  The following changes were made today:  none   Signed, Thompson Grayer, MD  08/31/2014 3:07 PM     [facility] [address] [place] [address] [phone] (office) [phone] (fax)

## 2014-09-10 ENCOUNTER — Ambulatory Visit (HOSPITAL_COMMUNITY): Payer: Medicare Other | Attending: Internal Medicine | Admitting: Radiology

## 2014-09-10 DIAGNOSIS — I4891 Unspecified atrial fibrillation: Secondary | ICD-10-CM | POA: Diagnosis not present

## 2014-09-10 NOTE — Progress Notes (Signed)
Echocardiogram performed.  

## 2014-09-15 ENCOUNTER — Ambulatory Visit (HOSPITAL_COMMUNITY)
Admission: RE | Admit: 2014-09-15 | Discharge: 2014-09-15 | Disposition: A | Discharge status: Home / Self Care | Payer: Medicare Other | Source: Ambulatory Visit | Attending: Obstetrics & Gynecology | Admitting: Obstetrics & Gynecology

## 2014-09-15 ENCOUNTER — Other Ambulatory Visit (HOSPITAL_COMMUNITY): Payer: Self-pay | Admitting: Obstetrics & Gynecology

## 2014-09-15 DIAGNOSIS — Z1231 Encounter for screening mammogram for malignant neoplasm of breast: Secondary | ICD-10-CM | POA: Diagnosis not present

## 2014-09-16 ENCOUNTER — Other Ambulatory Visit: Payer: Self-pay | Admitting: Internal Medicine

## 2014-09-24 ENCOUNTER — Ambulatory Visit: Payer: Medicare Other | Admitting: Sports Medicine

## 2014-10-07 DIAGNOSIS — H26492 Other secondary cataract, left eye: Secondary | ICD-10-CM | POA: Diagnosis not present

## 2014-10-07 DIAGNOSIS — H33321 Round hole, right eye: Secondary | ICD-10-CM | POA: Diagnosis not present

## 2014-10-07 DIAGNOSIS — Z961 Presence of intraocular lens: Secondary | ICD-10-CM | POA: Diagnosis not present

## 2014-10-08 DIAGNOSIS — R197 Diarrhea, unspecified: Secondary | ICD-10-CM | POA: Diagnosis not present

## 2014-10-12 DIAGNOSIS — R197 Diarrhea, unspecified: Secondary | ICD-10-CM | POA: Diagnosis not present

## 2014-10-14 ENCOUNTER — Ambulatory Visit (INDEPENDENT_AMBULATORY_CARE_PROVIDER_SITE_OTHER): Payer: Medicare Other | Admitting: Internal Medicine

## 2014-10-14 ENCOUNTER — Other Ambulatory Visit (INDEPENDENT_AMBULATORY_CARE_PROVIDER_SITE_OTHER): Payer: Medicare Other

## 2014-10-14 ENCOUNTER — Encounter: Payer: Self-pay | Admitting: Internal Medicine

## 2014-10-14 VITALS — BP 128/80 | HR 99 | Temp 98.1°F | Resp 18 | Ht 64.0 in | Wt 170.0 lb

## 2014-10-14 DIAGNOSIS — M8580 Other specified disorders of bone density and structure, unspecified site: Secondary | ICD-10-CM

## 2014-10-14 DIAGNOSIS — E669 Obesity, unspecified: Secondary | ICD-10-CM | POA: Insufficient documentation

## 2014-10-14 DIAGNOSIS — E876 Hypokalemia: Secondary | ICD-10-CM

## 2014-10-14 DIAGNOSIS — Z23 Encounter for immunization: Secondary | ICD-10-CM | POA: Diagnosis not present

## 2014-10-14 DIAGNOSIS — I10 Essential (primary) hypertension: Secondary | ICD-10-CM

## 2014-10-14 DIAGNOSIS — E782 Mixed hyperlipidemia: Secondary | ICD-10-CM | POA: Diagnosis not present

## 2014-10-14 DIAGNOSIS — E66811 Obesity, class 1: Secondary | ICD-10-CM | POA: Insufficient documentation

## 2014-10-14 DIAGNOSIS — Z0000 Encounter for general adult medical examination without abnormal findings: Secondary | ICD-10-CM | POA: Diagnosis not present

## 2014-10-14 DIAGNOSIS — H6121 Impacted cerumen, right ear: Secondary | ICD-10-CM

## 2014-10-14 LAB — BASIC METABOLIC PANEL
BUN: 20 mg/dL (ref 6–23)
CALCIUM: 9.7 mg/dL (ref 8.4–10.5)
CO2: 28 meq/L (ref 19–32)
Chloride: 102 mEq/L (ref 96–112)
Creatinine, Ser: 0.79 mg/dL (ref 0.40–1.20)
GFR: 77.01 mL/min (ref >60.00)
Glucose, Bld: 103 mg/dL — ABNORMAL HIGH (ref 70–99)
Potassium: 3.1 mEq/L — ABNORMAL LOW (ref 3.5–5.1)
SODIUM: 139 meq/L (ref 135–145)

## 2014-10-14 LAB — TSH: TSH: 1.17 u[IU]/mL (ref 0.35–4.50)

## 2014-10-14 LAB — LIPID PANEL
Cholesterol: 186 mg/dL (ref 0–200)
HDL: 62.1 mg/dL (ref >39.00)
LDL Cholesterol: 100 mg/dL — ABNORMAL HIGH (ref 0–99)
NonHDL: 123.9
TRIGLYCERIDES: 122 mg/dL (ref 0.0–149.0)
Total CHOL/HDL Ratio: 3
VLDL: 24.4 mg/dL (ref 0.0–40.0)

## 2014-10-14 LAB — CBC WITH DIFFERENTIAL/PLATELET
Basophils Absolute: 0 10*3/uL (ref 0.0–0.1)
Basophils Relative: 0.6 % (ref 0.0–3.0)
EOS ABS: 0.1 10*3/uL (ref 0.0–0.7)
EOS PCT: 1.4 % (ref 0.0–5.0)
HCT: 44.7 % (ref 36.0–46.0)
Hemoglobin: 15.3 g/dL — ABNORMAL HIGH (ref 12.0–15.0)
Lymphocytes Relative: 32.5 % (ref 12.0–46.0)
Lymphs Abs: 2.5 10*3/uL (ref 0.7–4.0)
MCHC: 34.3 g/dL (ref 30.0–36.0)
MCV: 90.7 fl (ref 78.0–100.0)
Monocytes Absolute: 0.6 10*3/uL (ref 0.1–1.0)
Monocytes Relative: 8.1 % (ref 3.0–12.0)
Neutro Abs: 4.5 10*3/uL (ref 1.4–7.7)
Neutrophils Relative %: 57.4 % (ref 43.0–77.0)
PLATELETS: 240 10*3/uL (ref 150.0–400.0)
RBC: 4.93 Mil/uL (ref 3.87–5.11)
RDW: 13.5 % (ref 11.5–15.5)
WBC: 7.8 10*3/uL (ref 4.0–10.5)

## 2014-10-14 LAB — VITAMIN D 25 HYDROXY (VIT D DEFICIENCY, FRACTURES): VITD: 42.67 ng/mL (ref 30.00–100.00)

## 2014-10-14 MED ORDER — DILTIAZEM HCL 30 MG PO TABS: 30.0000 mg | ORAL_TABLET | ORAL | Status: DC | PRN

## 2014-10-14 MED ORDER — LISINOPRIL 40 MG PO TABS: ORAL_TABLET | ORAL | Status: DC

## 2014-10-14 MED ORDER — PRAVASTATIN SODIUM 20 MG PO TABS: 20.0000 mg | ORAL_TABLET | Freq: Every day | ORAL | Status: DC

## 2014-10-14 MED ORDER — DABIGATRAN ETEXILATE MESYLATE 150 MG PO CAPS: 150.0000 mg | ORAL_CAPSULE | Freq: Two times a day (BID) | ORAL | Status: DC

## 2014-10-14 MED ORDER — TRAMADOL HCL 50 MG PO TABS: 50.0000 mg | ORAL_TABLET | Freq: Four times a day (QID) | ORAL | Status: DC | PRN

## 2014-10-14 MED ORDER — HYDROCHLOROTHIAZIDE 25 MG PO TABS: 25.0000 mg | ORAL_TABLET | Freq: Every day | ORAL | Status: DC

## 2014-10-14 MED ORDER — DILTIAZEM HCL ER COATED BEADS 360 MG PO CP24: 360.0000 mg | ORAL_CAPSULE | Freq: Every day | ORAL | Status: DC

## 2014-10-14 NOTE — Assessment & Plan Note (Signed)
BP Readings from Last 3 Encounters:  10/14/14 128/80  08/31/14 132/66  08/20/14 114/45   Low normal since lose of >20# since summer 2014, then gradual increase again Changed hctz to prn, but now taking qd with weight gain

## 2014-10-14 NOTE — Assessment & Plan Note (Signed)
On statin - last check at goal, monitor annually

## 2014-10-14 NOTE — Progress Notes (Signed)
Subjective:    Patient ID: [name], female    DOB: 1946/03/08, 69 y.o.   MRN: [id number]  HPI   Here for medicare wellness  Diet: heart healthy  Physical activity: sedentary Depression/mood screen: negative Hearing: intact to whispered voice but diminished on R for last 3 mo, intermittent Visual acuity: grossly normal, performs annual eye exam  ADLs: capable Fall risk: none Home safety: good Cognitive evaluation: intact to orientation, naming, recall and repetition EOL planning: adv directives, full code/ I agree  I have personally reviewed and have noted 1. The patient's medical and social history 2. Their use of alcohol, tobacco or illicit drugs 3. Their current medications and supplements 4. The patient's functional ability including ADL's, fall risks, home safety risks and hearing or visual impairment. 5. Diet and physical activities 6. Evidence for depression or mood disorders  Also reviewed interval medical events and chronic medical issues   Past Medical History  Diagnosis Date  . Mixed hyperlipidemia   . MITRAL VALVE PROLAPSE   . HYPERTENSION   . Atrial fibrillation     chronic anticoag - Pradaxa  . BURSITIS, SUBTROCHANTERIC   . ARTHRITIS, HIPS, BILATERAL    Family History  Problem Relation Age of Onset  . Leukemia Father   . Hypertension Mother   . Heart failure Mother    History  Substance Use Topics  . Smoking status: Never Smoker   . Smokeless tobacco: Not on file  . Alcohol Use: No    Review of Systems  Constitutional: Negative for fatigue and unexpected weight change.  Respiratory: Negative for cough, shortness of breath and wheezing.   Cardiovascular: Negative for chest pain, palpitations and leg swelling.  Gastrointestinal: Negative for nausea, abdominal pain and diarrhea.  Neurological: Negative for dizziness, weakness, light-headedness and headaches.  Psychiatric/Behavioral: Negative for dysphoric mood. The patient is not  nervous/anxious.   All other systems reviewed and are negative.      Objective:    Physical Exam  Constitutional: She is oriented to person, place, and time. She appears well-developed and well-nourished. No distress.  HENT:  Head: Normocephalic and atraumatic.  Right Ear: External ear normal.  Left Ear: External ear normal.  Nose: Nose normal.  Mouth/Throat: Oropharynx is clear and moist. No oropharyngeal exudate.  Right tympanic membrane initially discovered with hard cerumen. After irrigation and disimpaction on right, tympanic membranes clear bilaterally  Eyes: EOM are normal. Pupils are equal, round, and reactive to light. Right eye exhibits no discharge. Left eye exhibits no discharge. No scleral icterus.  Neck: Normal range of motion. Neck supple. No JVD present. No tracheal deviation present. No thyromegaly present.  Cardiovascular: Normal rate, regular rhythm, normal heart sounds and intact distal pulses.  Exam reveals no friction rub.   No murmur heard. Pulmonary/Chest: Effort normal and breath sounds normal. No respiratory distress. She has no wheezes. She has no rales. She exhibits no tenderness.  Abdominal: Soft. Bowel sounds are normal. She exhibits no distension and no mass. There is no tenderness. There is no rebound and no guarding.  Genitourinary:  Defer to gyn  Musculoskeletal: Normal range of motion.  No gross deformities  Lymphadenopathy:    She has no cervical adenopathy.  Neurological: She is alert and oriented to person, place, and time. She has normal reflexes. No cranial nerve deficit.  Skin: Skin is warm and dry. No rash noted. She is not diaphoretic. No erythema.  Psychiatric: She has a normal mood and affect. Her behavior is  normal. Judgment and thought content normal.  Nursing note and vitals reviewed.   BP 128/80 mmHg  Pulse 99  Temp(Src) 98.1 F (36.7 C) (Oral)  Resp 18  Ht 5\' 4"  (1.626 m)  Wt 170 lb (77.111 kg)  BMI 29.17 kg/m2  SpO2 97% Wt  Readings from Last 3 Encounters:  10/14/14 170 lb (77.111 kg)  08/31/14 173 lb (78.472 kg)  08/20/14 170 lb (77.111 kg)    Lab Results  Component Value Date   WBC 12.9* 04/02/2014   HGB 15.4* 04/02/2014   HCT 45.4 04/02/2014   PLT 236.0 04/02/2014   GLUCOSE 73 04/02/2014   CHOL 219* 11/25/2013   TRIG 115.0 11/25/2013   HDL 57.80 11/25/2013   LDLDIRECT 149.3 09/25/2008   LDLCALC 138* 11/25/2013   ALT 11 11/25/2013   AST 16 11/25/2013   NA 140 04/02/2014   K 3.5 04/02/2014   CL 104 04/02/2014   CREATININE 0.7 04/02/2014   BUN 13 04/02/2014   CO2 29 04/02/2014   TSH 1.15 04/02/2014   INR 1.4 01/25/2010   HGBA1C 5.9 04/02/2014    Mm Screening Breast Tomo Bilateral  09/16/2014   CLINICAL DATA:  Screening.  EXAM: DIGITAL SCREENING BILATERAL MAMMOGRAM WITH 3D TOMO WITH CAD  COMPARISON:  Previous exam(s).  ACR Breast Density Category b: There are scattered areas of fibroglandular density.  FINDINGS: There are no findings suspicious for malignancy. Images were processed with CAD.  IMPRESSION: No mammographic evidence of malignancy. A result letter of this screening mammogram will be mailed directly to the patient.  RECOMMENDATION: Screening mammogram in one year. (Code:SM-B-01Y)  BI-RADS CATEGORY  1: Negative.   Electronically Signed   By: Lillia Mountain M.D.   On: 09/16/2014 13:56     Procedure: wax removal Reason: wax impaction Risks and benefits of procedure discussed with the patient who agrees to proceed. Ear(s) irrigated with warm water. Large amount of wax removed. Instrumentation with metal ear loop was performed to accomplish wax removal. the patient tolerated procedure well.      Assessment & Plan:   CPX/AWC/z00.00- Today patient counseled on age appropriate routine health concerns for screening and prevention, each reviewed and up to date or declined. Immunizations reviewed and up to date or declined. Labs ordered and reviewed. Risk factors for depression reviewed and  negative. Hearing function and visual acuity are intact. ADLs screened and addressed as needed. Functional ability and level of safety reviewed and appropriate. Education, counseling and referrals performed based on assessed risks today. Patient provided with a copy of personalized plan for preventive services.  Cerumen impaction, right -irrigation as noted procedure. Patient tolerated well. Patient will call if continued problems referral is needed  Problem List Items Addressed This Visit    Essential hypertension    BP Readings from Last 3 Encounters:  10/14/14 128/80  08/31/14 132/66  08/20/14 114/45   Low normal since lose of >20# since summer 2014, then gradual increase again Changed hctz to prn, but now taking qd with weight gain      Relevant Medications   hydrochlorothiazide tablet   lisinopril (PRINIVIL,ZESTRIL) tablet   dabigatran (PRADAXA) capsule 150 mg   diltiazem (CARDIZEM CD) 24 hr capsule   diltiazem (CARDIZEM) tablet   pravastatin (PRAVACHOL) tablet   Other Relevant Orders   Basic metabolic panel   CBC with Differential/Platelet   Lipid panel   MIXED HYPERLIPIDEMIA    On statin - last check at goal, monitor annually      Relevant  Medications   hydrochlorothiazide tablet   lisinopril (PRINIVIL,ZESTRIL) tablet   dabigatran (PRADAXA) capsule 150 mg   diltiazem (CARDIZEM CD) 24 hr capsule   diltiazem (CARDIZEM) tablet   pravastatin (PRAVACHOL) tablet   Other Relevant Orders   Lipid panel   Obese    Wt Readings from Last 3 Encounters:  10/14/14 170 lb (77.111 kg)  08/31/14 173 lb (78.472 kg)  08/20/14 170 lb (77.111 kg)   The patient is asked to make an attempt to improve diet and exercise patterns to aid in medical management of this problem. Patient to consider evaluation with nutritionist, will call if referral desired      Relevant Orders   Lipid panel   TSH   Osteopenia    Last DEXA 06/2013 reviewed Check Vit D Continue WB exercises and Ca+D        Relevant Orders   CBC with Differential/Platelet   Vit D  25 hydroxy (rtn osteoporosis monitoring)    Other Visit Diagnoses    Routine general medical examination at a health care facility    -  Primary    Need for prophylactic vaccination against Streptococcus pneumoniae (pneumococcus)        Relevant Orders    Pneumococcal conjugate vaccine 13-valent (Completed)    Cerumen impaction, right            Gwendolyn Grant, MD

## 2014-10-14 NOTE — Progress Notes (Signed)
Pre visit review using our clinic review tool, if applicable. No additional management support is needed unless otherwise documented below in the visit note. 

## 2014-10-14 NOTE — Patient Instructions (Addendum)
It was good to see you today.  We have reviewed your prior records including labs and tests today  Prevnar 13 pneumonia vaccine updated today.  Other Health Maintenance reviewed - all recommended immunizations and age-appropriate screenings are up-to-date.  Test(s) ordered today. Your results will be released to [facility] (or called to you) after review, usually within 72hours after test completion. If any changes need to be made, you will be notified at that same time.  Medications reviewed and updated, no changes recommended at this time. Your prescription(s) have been submitted to your pharmacy. Please take as directed and contact our office if you believe you are having problem(s) with the medication(s).  Your ear has been irrigated of wax today -let us know if continued hearing problems persist for referral to audiologist and hearing testing  Also, please let me know if referral to nutritionist is desired. He can send me a my chart message with the name of nutritionist you would like to see  Please schedule followup in 12 months for annual exam and labs, call sooner if problems.  Health Maintenance Adopting a healthy lifestyle and getting preventive care can go a long way to promote health and wellness. Talk with your health care provider about what schedule of regular examinations is right for you. This is a good chance for you to check in with your provider about disease prevention and staying healthy. In between checkups, there are plenty of things you can do on your own. Experts have done a lot of research about which lifestyle changes and preventive measures are most likely to keep you healthy. Ask your health care provider for more information. WEIGHT AND DIET  Eat a healthy diet  Be sure to include plenty of vegetables, fruits, low-fat dairy products, and lean protein.  Do not eat a lot of foods high in solid fats, added sugars, or salt.  Get regular exercise. This is one of the  most important things you can do for your health.  Most adults should exercise for at least 150 minutes each week. The exercise should increase your heart rate and make you sweat (moderate-intensity exercise).  Most adults should also do strengthening exercises at least twice a week. This is in addition to the moderate-intensity exercise.  Maintain a healthy weight  Body mass index (BMI) is a measurement that can be used to identify possible weight problems. It estimates body fat based on height and weight. Your health care provider can help determine your BMI and help you achieve or maintain a healthy weight.  For females 106 years of age and older:   A BMI below 18.5 is considered underweight.  A BMI of 18.5 to 24.9 is normal.  A BMI of 25 to 29.9 is considered overweight.  A BMI of 30 and above is considered obese.  Watch levels of cholesterol and blood lipids  You should start having your blood tested for lipids and cholesterol at 68 years of age, then have this test every 5 years.  You may need to have your cholesterol levels checked more often if:  Your lipid or cholesterol levels are high.  You are older than 68 years of age.  You are at high risk for heart disease.  CANCER SCREENING   Lung Cancer  Lung cancer screening is recommended for adults 50-62 years old who are at high risk for lung cancer because of a history of smoking.  A yearly low-dose CT scan of the lungs is recommended for people  who:  Currently smoke.  Have quit within the past 15 years.  Have at least a 30-pack-year history of smoking. A pack year is smoking an average of one pack of cigarettes a day for 1 year.  Yearly screening should continue until it has been 15 years since you quit.  Yearly screening should stop if you develop a health problem that would prevent you from having lung cancer treatment.  Breast Cancer  Practice breast self-awareness. This means understanding how your  breasts normally appear and feel.  It also means doing regular breast self-exams. Let your health care provider know about any changes, no matter how small.  If you are in your 20s or 30s, you should have a clinical breast exam (CBE) by a health care provider every 1-3 years as part of a regular health exam.  If you are 35 or older, have a CBE every year. Also consider having a breast X-ray (mammogram) every year.  If you have a family history of breast cancer, talk to your health care provider about genetic screening.  If you are at high risk for breast cancer, talk to your health care provider about having an MRI and a mammogram every year.  Breast cancer gene (BRCA) assessment is recommended for women who have family members with BRCA-related cancers. BRCA-related cancers include:  Breast.  Ovarian.  Tubal.  Peritoneal cancers.  Results of the assessment will determine the need for genetic counseling and BRCA1 and BRCA2 testing. Cervical Cancer Routine pelvic examinations to screen for cervical cancer are no longer recommended for nonpregnant women who are considered low risk for cancer of the pelvic organs (ovaries, uterus, and vagina) and who do not have symptoms. A pelvic examination may be necessary if you have symptoms including those associated with pelvic infections. Ask your health care provider if a screening pelvic exam is right for you.   The Pap test is the screening test for cervical cancer for women who are considered at risk.  If you had a hysterectomy for a problem that was not cancer or a condition that could lead to cancer, then you no longer need Pap tests.  If you are older than 65 years, and you have had normal Pap tests for the past 10 years, you no longer need to have Pap tests.  If you have had past treatment for cervical cancer or a condition that could lead to cancer, you need Pap tests and screening for cancer for at least 20 years after your  treatment.  If you no longer get a Pap test, assess your risk factors if they change (such as having a new sexual partner). This can affect whether you should start being screened again.  Some women have medical problems that increase their chance of getting cervical cancer. If this is the case for you, your health care provider may recommend more frequent screening and Pap tests.  The human papillomavirus (HPV) test is another test that may be used for cervical cancer screening. The HPV test looks for the virus that can cause cell changes in the cervix. The cells collected during the Pap test can be tested for HPV.  The HPV test can be used to screen women 16 years of age and older. Getting tested for HPV can extend the interval between normal Pap tests from three to five years.  An HPV test also should be used to screen women of any age who have unclear Pap test results.  After 68 years of  age, women should have HPV testing as often as Pap tests.  Colorectal Cancer  This type of cancer can be detected and often prevented.  Routine colorectal cancer screening usually begins at 68 years of age and continues through 68 years of age.  Your health care provider may recommend screening at an earlier age if you have risk factors for colon cancer.  Your health care provider may also recommend using home test kits to check for hidden blood in the stool.  A small camera at the end of a tube can be used to examine your colon directly (sigmoidoscopy or colonoscopy). This is done to check for the earliest forms of colorectal cancer.  Routine screening usually begins at age 62.  Direct examination of the colon should be repeated every 5-10 years through 68 years of age. However, you may need to be screened more often if early forms of precancerous polyps or small growths are found. Skin Cancer  Check your skin from head to toe regularly.  Tell your health care provider about any new moles or  changes in moles, especially if there is a change in a mole's shape or color.  Also tell your health care provider if you have a mole that is larger than the size of a pencil eraser.  Always use sunscreen. Apply sunscreen liberally and repeatedly throughout the day.  Protect yourself by wearing long sleeves, pants, a wide-brimmed hat, and sunglasses whenever you are outside. HEART DISEASE, DIABETES, AND HIGH BLOOD PRESSURE   Have your blood pressure checked at least every 1-2 years. High blood pressure causes heart disease and increases the risk of stroke.  If you are between 16 years and 36 years old, ask your health care provider if you should take aspirin to prevent strokes.  Have regular diabetes screenings. This involves taking a blood sample to check your fasting blood sugar level.  If you are at a normal weight and have a low risk for diabetes, have this test once every three years after 68 years of age.  If you are overweight and have a high risk for diabetes, consider being tested at a younger age or more often. PREVENTING INFECTION  Hepatitis B  If you have a higher risk for hepatitis B, you should be screened for this virus. You are considered at high risk for hepatitis B if:  You were born in a country where hepatitis B is common. Ask your health care provider which countries are considered high risk.  Your parents were born in a high-risk country, and you have not been immunized against hepatitis B (hepatitis B vaccine).  You have HIV or AIDS.  You use needles to inject street drugs.  You live with someone who has hepatitis B.  You have had sex with someone who has hepatitis B.  You get hemodialysis treatment.  You take certain medicines for conditions, including cancer, organ transplantation, and autoimmune conditions. Hepatitis C  Blood testing is recommended for:  Everyone born from [birth year] through 1964.  Anyone with known risk factors for hepatitis  C. Sexually transmitted infections (STIs)  You should be screened for sexually transmitted infections (STIs) including gonorrhea and chlamydia if:  You are sexually active and are younger than 68 years of age.  You are older than 68 years of age and your health care provider tells you that you are at risk for this type of infection.  Your sexual activity has changed since you were last screened and you are at  an increased risk for chlamydia or gonorrhea. Ask your health care provider if you are at risk.  If you do not have HIV, but are at risk, it may be recommended that you take a prescription medicine daily to prevent HIV infection. This is called pre-exposure prophylaxis (PrEP). You are considered at risk if:  You are sexually active and do not regularly use condoms or know the HIV status of your partner(s).  You take drugs by injection.  You are sexually active with a partner who has HIV. Talk with your health care provider about whether you are at high risk of being infected with HIV. If you choose to begin PrEP, you should first be tested for HIV. You should then be tested every 3 months for as long as you are taking PrEP.  PREGNANCY   If you are premenopausal and you may become pregnant, ask your health care provider about preconception counseling.  If you may become pregnant, take 400 to 800 micrograms (mcg) of folic acid every day.  If you want to prevent pregnancy, talk to your health care provider about birth control (contraception). OSTEOPOROSIS AND MENOPAUSE   Osteoporosis is a disease in which the bones lose minerals and strength with aging. This can result in serious bone fractures. Your risk for osteoporosis can be identified using a bone density scan.  If you are 63 years of age or older, or if you are at risk for osteoporosis and fractures, ask your health care provider if you should be screened.  Ask your health care provider whether you should take a calcium or  vitamin D supplement to lower your risk for osteoporosis.  Menopause may have certain physical symptoms and risks.  Hormone replacement therapy may reduce some of these symptoms and risks. Talk to your health care provider about whether hormone replacement therapy is right for you.  HOME CARE INSTRUCTIONS   Schedule regular health, dental, and eye exams.  Stay current with your immunizations.   Do not use any tobacco products including cigarettes, chewing tobacco, or electronic cigarettes.  If you are pregnant, do not drink alcohol.  If you are breastfeeding, limit how much and how often you drink alcohol.  Limit alcohol intake to no more than 1 drink per day for nonpregnant women. One drink equals 12 ounces of beer, 5 ounces of wine, or 1 ounces of hard liquor.  Do not use street drugs.  Do not share needles.  Ask your health care provider for help if you need support or information about quitting drugs.  Tell your health care provider if you often feel depressed.  Tell your health care provider if you have ever been abused or do not feel safe at home. Document Released: 02/06/2011 Document Revised: 12/08/2013 Document Reviewed: 06/25/2013 [facility] Patient Information 2015 [place], [place]. This information is not intended to replace advice given to you by your health care provider. Make sure you discuss any questions you have with your health care provider. Exercise to Lose Weight Exercise and a healthy diet may help you lose weight. Your doctor may suggest specific exercises. EXERCISE IDEAS AND TIPS  Choose low-cost things you enjoy doing, such as walking, bicycling, or exercising to workout videos.  Take stairs instead of the elevator.  Walk during your lunch break.  Park your car further away from work or school.  Go to a gym or an exercise class.  Start with 5 to 10 minutes of exercise each day. Build up to 30 minutes of  exercise 4 to 6 days a week.  Wear shoes  with good support and comfortable clothes.  Stretch before and after working out.  Work out until you breathe harder and your heart beats faster.  Drink extra water when you exercise.  Do not do so much that you hurt yourself, feel dizzy, or get very short of breath. Exercises that burn about 150 calories:  Running 1  miles in 15 minutes.  Playing volleyball for 45 to 60 minutes.  Washing and waxing a car for 45 to 60 minutes.  Playing touch football for 45 minutes.  Walking 1  miles in 35 minutes.  Pushing a stroller 1  miles in 30 minutes.  Playing basketball for 30 minutes.  Raking leaves for 30 minutes.  Bicycling 5 miles in 30 minutes.  Walking 2 miles in 30 minutes.  Dancing for 30 minutes.  Shoveling snow for 15 minutes.  Swimming laps for 20 minutes.  Walking up stairs for 15 minutes.  Bicycling 4 miles in 15 minutes.  Gardening for 30 to 45 minutes.  Jumping rope for 15 minutes.  Washing windows or floors for 45 to 60 minutes. Document Released: 08/26/2010 Document Revised: 10/16/2011 Document Reviewed: 08/26/2010 [facility] Patient Information 2015 [place], [place]. This information is not intended to replace advice given to you by your health care provider. Make sure you discuss any questions you have with your health care provider.

## 2014-10-14 NOTE — Assessment & Plan Note (Signed)
Wt Readings from Last 3 Encounters:  10/14/14 170 lb (77.111 kg)  08/31/14 173 lb (78.472 kg)  08/20/14 170 lb (77.111 kg)   The patient is asked to make an attempt to improve diet and exercise patterns to aid in medical management of this problem. Patient to consider evaluation with nutritionist, will call if referral desired

## 2014-10-14 NOTE — Assessment & Plan Note (Signed)
Last DEXA 06/2013 reviewed Check Vit D Continue WB exercises and Ca+D

## 2014-10-16 MED ORDER — POTASSIUM CHLORIDE ER 10 MEQ PO TBCR: 10.0000 meq | EXTENDED_RELEASE_TABLET | Freq: Every day | ORAL | Status: DC

## 2014-10-16 NOTE — Addendum Note (Signed)
Addended by: Gwendolyn Grant A on: 10/16/2014 05:41 AM   Modules accepted: Orders, SmartSet

## 2014-10-20 ENCOUNTER — Other Ambulatory Visit: Payer: Self-pay | Admitting: Internal Medicine

## 2014-10-21 ENCOUNTER — Other Ambulatory Visit: Payer: Self-pay

## 2014-10-21 MED ORDER — DILTIAZEM HCL ER COATED BEADS 360 MG PO CP24: 360.0000 mg | ORAL_CAPSULE | Freq: Every day | ORAL | Status: DC

## 2014-10-23 ENCOUNTER — Telehealth: Payer: Self-pay | Admitting: Internal Medicine

## 2014-10-23 NOTE — Telephone Encounter (Signed)
Pt contact. Confirmed receipt of lab results via mychart.

## 2014-10-23 NOTE — Telephone Encounter (Signed)
Patient is returning your call [phone]

## 2014-10-27 ENCOUNTER — Other Ambulatory Visit: Payer: Self-pay | Admitting: *Deleted

## 2014-10-27 MED ORDER — DILTIAZEM HCL ER COATED BEADS 360 MG PO CP24: 360.0000 mg | ORAL_CAPSULE | Freq: Every day | ORAL | Status: DC

## 2014-11-02 DIAGNOSIS — R197 Diarrhea, unspecified: Secondary | ICD-10-CM | POA: Diagnosis not present

## 2014-11-02 DIAGNOSIS — R740 Nonspecific elevation of levels of transaminase and lactic acid dehydrogenase [LDH]: Secondary | ICD-10-CM | POA: Diagnosis not present

## 2014-11-02 LAB — HEPATIC FUNCTION PANEL
ALK PHOS: 94 U/L (ref 25–125)
ALT: 21 U/L (ref 7–35)
AST: 20 U/L (ref 13–35)
Bilirubin, Total: 0.3 mg/dL

## 2014-11-10 ENCOUNTER — Other Ambulatory Visit: Payer: Self-pay | Admitting: *Deleted

## 2014-11-10 MED ORDER — PREDNISONE 20 MG PO TABS: ORAL_TABLET | ORAL | Status: DC

## 2014-11-11 ENCOUNTER — Telehealth: Payer: Self-pay | Admitting: Internal Medicine

## 2014-11-11 NOTE — Telephone Encounter (Signed)
Spoke to pt and informed that lab results are ready for pick up.

## 2014-11-11 NOTE — Telephone Encounter (Signed)
Pt called in and wants to pick up this year and last years cpe blood results.

## 2014-11-12 DIAGNOSIS — H26492 Other secondary cataract, left eye: Secondary | ICD-10-CM | POA: Diagnosis not present

## 2014-11-17 ENCOUNTER — Ambulatory Visit (INDEPENDENT_AMBULATORY_CARE_PROVIDER_SITE_OTHER): Payer: Medicare Other | Admitting: Sports Medicine

## 2014-11-17 ENCOUNTER — Encounter: Payer: Self-pay | Admitting: Sports Medicine

## 2014-11-17 VITALS — BP 101/46 | HR 97 | Ht 64.0 in | Wt 170.0 lb

## 2014-11-17 DIAGNOSIS — M5136 Other intervertebral disc degeneration, lumbar region: Secondary | ICD-10-CM | POA: Diagnosis not present

## 2014-11-17 DIAGNOSIS — M7062 Trochanteric bursitis, left hip: Secondary | ICD-10-CM | POA: Diagnosis not present

## 2014-11-17 MED ORDER — TRAMADOL HCL 50 MG PO TABS: 50.0000 mg | ORAL_TABLET | Freq: Four times a day (QID) | ORAL | Status: DC | PRN

## 2014-11-17 NOTE — Patient Instructions (Signed)
Keep up easy back motion exercises (3x week) Your idea of weight loss is good and will helps conditions If you can get off of HCTZ in future that will help  Keep up a couple of the hip exercises (3x week)  Explore some short walks - don't create hip pain  Use the prednisone only as needed and let's track how often  I'll renew when you run out  I'll renew tramadol

## 2014-11-17 NOTE — Assessment & Plan Note (Signed)
Recent flare of her low back pain  I think this arose from a change in her activity  Continue to do easy back motion exercises  Recheck when necessary

## 2014-11-17 NOTE — Progress Notes (Signed)
Patient ID: [name], female   DOB: 12/24/1945, 69 y.o.   MRN: [id number]   Takes blood thinners and has not been on NSAIDs because of risk Chronic AFib  Possible gout has caused flares of GT bursitis and other joint pain UA is 7.7 Colchicine not really any help  LBP hx with ruptured lumbar disks Sxs are different with this  Prednisone usually helps within 2 days for either the smaller joint or bursa flares Also helps the LBP  She had a recent flare of low back pain that resolved after only 2 days of prednisone  She comes to discuss her best options  Physical examination Obese white female no acute distress BP 101/46 mmHg  Pulse 97  Ht 5\' 4"  (1.626 m)  Wt 170 lb (77.111 kg)  BMI 29.17 kg/m2  She has mild limitation of extension, good flexion and has tight but good lateral bending and rotation of the low back Walking gait is normal Straight leg raise is normal Hip shows full range of motion bilaterally Mild tenderness to palpation over the greater trochanter bilaterally

## 2014-11-17 NOTE — Assessment & Plan Note (Signed)
Her chronic issues with lateral hip pain more suggestive of possible gout flares  Not significant today  We may want to treat those with periodic oral prednisone if they are not very frequent  Given instructions about using a 3 to five-day treatment course

## 2014-12-01 DIAGNOSIS — R197 Diarrhea, unspecified: Secondary | ICD-10-CM | POA: Diagnosis not present

## 2014-12-10 ENCOUNTER — Encounter: Payer: Self-pay | Admitting: Internal Medicine

## 2014-12-14 DIAGNOSIS — R197 Diarrhea, unspecified: Secondary | ICD-10-CM | POA: Diagnosis not present

## 2015-01-10 ENCOUNTER — Other Ambulatory Visit: Payer: Self-pay | Admitting: Internal Medicine

## 2015-01-11 ENCOUNTER — Other Ambulatory Visit: Payer: Self-pay

## 2015-01-11 NOTE — Telephone Encounter (Signed)
Per note 3.15.16

## 2015-01-20 ENCOUNTER — Ambulatory Visit (INDEPENDENT_AMBULATORY_CARE_PROVIDER_SITE_OTHER): Payer: Medicare Other | Admitting: Sports Medicine

## 2015-01-20 ENCOUNTER — Encounter: Payer: Self-pay | Admitting: Sports Medicine

## 2015-01-20 VITALS — BP 111/54 | HR 69 | Ht 64.0 in | Wt 160.0 lb

## 2015-01-20 DIAGNOSIS — M150 Primary generalized (osteo)arthritis: Secondary | ICD-10-CM | POA: Diagnosis not present

## 2015-01-20 DIAGNOSIS — M6248 Contracture of muscle, other site: Secondary | ICD-10-CM

## 2015-01-20 DIAGNOSIS — M62838 Other muscle spasm: Secondary | ICD-10-CM

## 2015-01-20 MED ORDER — BACLOFEN 10 MG PO TABS: 10.0000 mg | ORAL_TABLET | Freq: Three times a day (TID) | ORAL | Status: DC

## 2015-01-20 MED ORDER — PREDNISONE 20 MG PO TABS: ORAL_TABLET | ORAL | Status: DC

## 2015-01-20 NOTE — Progress Notes (Signed)
  [name] - 69 y.o. female MRN [id number]  Date of birth: 1946/03/16  SUBJECTIVE: CC: 1.  neck pain, left-sided, initial evaluation      HPI:   insidious onset beginning 3 days ago of left upper trapezius and left-sided cervical pain. No radicular symptoms. She has started prednisone and taking 20 mg tablets twice a day for the past 4 days. Multiple prior medical problems and inhibit NSAIDs. Denies any radicular component, upper extremity weakness, fevers, chills or weight loss.      ROS:  Per HPI   HISTORY:  Past Medical, Surgical, Social, and Family History reviewed & updated per EMR.  Pertinent Historical Findings include: Social History   Occupational History  . Not on file.   Social History Main Topics  . Smoking status: Never Smoker   . Smokeless tobacco: Not on file  . Alcohol Use: No  . Drug Use: Yes  . Sexual Activity: Not on file    No specialty comments available. No problems updated.  OBJECTIVE:  VS:   HT:5\' 4"  (162.6 cm)   WT:160 lb (72.576 kg)  BMI:27.5          BP:(!) 111/54 mmHg  HR:69bpm  TEMP: ( )  RESP:   PHYSICAL EXAM: GENERAL: Adult female. No acute distress  PSYCH: Alert and appropriately interactive.  SKIN: No open skin lesions or abnormal skin markings on areas inspected as below  VASCULAR: Bilateral radial pulses 2+/4  NEURO: Sensation intact to light touch, UE strength 5/5 in all myotomes, normal DTRs.  CERVICAL: Neg spurlings/Lhermittes compression tests. Limited extension by 10-15 degrees. Symmetric sidebending/rotation. Full flexion.    ASSESSMENT & PLAN: See problem based charting & AVS for additional documentation.  Diagnoses and all orders for this visit:  Neck muscle spasm Comments: Likely minimal cervical radiculitis. <6 weeks Orders: -     predniSONE (DELTASONE) 20 MG tablet; Take one pill twice a day for one week then one pill daily for one week -     baclofen (LIORESAL) 10 MG tablet; Take 1 tablet (10 mg total) by mouth  3 (three) times daily.  Primary generalized (osteo)arthritis      FOLLOW UP:  Return if symptoms worsen or fail to improve.

## 2015-02-25 DIAGNOSIS — Z124 Encounter for screening for malignant neoplasm of cervix: Secondary | ICD-10-CM | POA: Diagnosis not present

## 2015-02-25 DIAGNOSIS — Z6827 Body mass index (BMI) 27.0-27.9, adult: Secondary | ICD-10-CM | POA: Diagnosis not present

## 2015-03-11 ENCOUNTER — Ambulatory Visit (HOSPITAL_COMMUNITY)
Admission: RE | Admit: 2015-03-11 | Discharge: 2015-03-11 | Disposition: A | Discharge status: Home / Self Care | Payer: Medicare Other | Source: Ambulatory Visit | Attending: Nurse Practitioner | Admitting: Nurse Practitioner

## 2015-03-11 ENCOUNTER — Encounter (HOSPITAL_COMMUNITY): Payer: Self-pay | Admitting: Nurse Practitioner

## 2015-03-11 VITALS — BP 112/60 | HR 69 | Ht 64.0 in | Wt 154.4 lb

## 2015-03-11 DIAGNOSIS — I481 Persistent atrial fibrillation: Secondary | ICD-10-CM

## 2015-03-11 DIAGNOSIS — Z7902 Long term (current) use of antithrombotics/antiplatelets: Secondary | ICD-10-CM | POA: Insufficient documentation

## 2015-03-11 DIAGNOSIS — I10 Essential (primary) hypertension: Secondary | ICD-10-CM | POA: Insufficient documentation

## 2015-03-11 DIAGNOSIS — I4891 Unspecified atrial fibrillation: Secondary | ICD-10-CM | POA: Insufficient documentation

## 2015-03-11 DIAGNOSIS — Z79899 Other long term (current) drug therapy: Secondary | ICD-10-CM | POA: Diagnosis not present

## 2015-03-11 DIAGNOSIS — E782 Mixed hyperlipidemia: Secondary | ICD-10-CM | POA: Diagnosis not present

## 2015-03-11 DIAGNOSIS — Z8249 Family history of ischemic heart disease and other diseases of the circulatory system: Secondary | ICD-10-CM | POA: Diagnosis not present

## 2015-03-11 DIAGNOSIS — I4819 Other persistent atrial fibrillation: Secondary | ICD-10-CM

## 2015-03-11 NOTE — Progress Notes (Signed)
Patient ID: [name], female   DOB: [birth year], 69 y.o.   MRN: [id number]     Primary Care Physician: Gwendolyn Grant, MD Referring Physician:   MIRZA KIDNEY is a 69 y.o. female with a h/o chronic longstanding afib here for f/u in the afib clinic. Ovrall she has done well and requires 30 mg tab of diltaizem very infrequently. She did use it 3 times in July. She doesn't feel irregular heart, just a sensation of overall weakness. This was unusual to require it 3 times in a 10 day period of time. Otherwise, she can do her usual activities without any issues. Continuing blood thinner without bleeding.  Today, she denies symptoms of palpitations, chest pain, shortness of breath, orthopnea, PND, lower extremity edema, dizziness, presyncope, syncope, or neurologic sequela. The patient is tolerating medications without difficulties and is otherwise without complaint today.   Past Medical History  Diagnosis Date  . Mixed hyperlipidemia   . MITRAL VALVE PROLAPSE   . HYPERTENSION   . Atrial fibrillation     chronic anticoag - Pradaxa  . BURSITIS, SUBTROCHANTERIC   . ARTHRITIS, HIPS, BILATERAL    Past Surgical History  Procedure Laterality Date  . Tonsillectomy    . Dilation and curettage of uterus      Current Outpatient Prescriptions  Medication Sig Dispense Refill  . bifidobacterium infantis (ALIGN) capsule Take 1 capsule by mouth daily. 30 capsule 11  . cholecalciferol (VITAMIN D) 1000 UNITS tablet Take 2,000 Units by mouth daily.    [name] co-enzyme Q-10 50 MG capsule Take 50 mg by mouth daily.    [name] CREON 36000 UNITS CPEP capsule TAKE 1- 2 CAPSULE(S) WITH MEALS  6  . diltiazem (CARDIZEM CD) 360 MG 24 hr capsule Take 1 capsule by mouth  daily 90 capsule 1  . diltiazem (CARDIZEM) 30 MG tablet Take 1 tablet (30 mg total) by mouth as needed (for A Fib). 30 tablet 0  . hydrochlorothiazide (HYDRODIURIL) 25 MG tablet Take 1 tablet (25 mg total) by mouth daily. 90 tablet 3  .  lisinopril (PRINIVIL,ZESTRIL) 40 MG tablet Take 1 tablet by mouth  daily 90 tablet 3  . potassium chloride (K-DUR) 10 MEQ tablet Take 1 tablet (10 mEq total) by mouth daily. 90 tablet 3  . PRADAXA 150 MG CAPS capsule Take 1 capsule by mouth  every 12 hours 180 capsule 1  . pravastatin (PRAVACHOL) 20 MG tablet Take 1 tablet (20 mg total) by mouth daily. 90 tablet 3  . traMADol (ULTRAM) 50 MG tablet Take 1 tablet (50 mg total) by mouth every 6 (six) hours as needed (pain). 60 tablet 3   No current facility-administered medications for this encounter.    No Known Allergies  History   Social History  . Marital Status: Married    Spouse Name: N/A  . Number of Children: N/A  . Years of Education: N/A   Occupational History  . Not on file.   Social History Main Topics  . Smoking status: Never Smoker   . Smokeless tobacco: Not on file  . Alcohol Use: No  . Drug Use: Yes  . Sexual Activity: Not on file   Other Topics Concern  . Not on file   Social History Narrative    Family History  Problem Relation Age of Onset  . Leukemia Father   . Hypertension Mother   . Heart failure Mother     ROS- All systems are reviewed and negative except as per the  HPI above  Physical Exam: Filed Vitals:   03/11/15 0847  BP: 112/60  Pulse: 69  Height: 5\' 4"  (1.626 m)  Weight: 154 lb 6.4 oz (70.035 kg)    GEN- The patient is well appearing, alert and oriented x 3 today.   Head- normocephalic, atraumatic Eyes-  Sclera clear, conjunctiva pink Ears- hearing intact Oropharynx- clear Neck- supple, no JVP Lymph- no cervical lymphadenopathy Lungs- Clear to ausculation bilaterally, normal work of breathing Heart- Regular rate and rhythm, no murmurs, rubs or gallops, PMI not laterally displaced GI- soft, NT, ND, + BS Extremities- no clubbing, cyanosis, or edema MS- no significant deformity or atrophy Skin- no rash or lesion Psych- euthymic mood, full affect Neuro- strength and sensation  are intact  EKG- Afib at 69 bpm, QRS 68 ms, QTc 420 ms. Echo -2/16-Left ventricle: The cavity size was normal. There was mild concentric hypertrophy. Systolic function was normal. The estimated ejection fraction was in the range of 60% to 65%. Wall motion was normal; there were no regional wall motion abnormalities. The study was not technically sufficient to allow evaluation of LV diastolic dysfunction due to atrial fibrillation. - Aortic valve: Trileaflet; normal thickness leaflets. There was no regurgitation. - Aortic root: The aortic root was normal in size. - Mitral valve: Structurally normal valve. - Left atrium: The atrium was mildly dilated. - Right ventricle: The cavity size was normal. Wall thickness was normal. Systolic function was normal. - Right atrium: The atrium was normal in size. - Tricuspid valve: There was mild regurgitation. - Pulmonic valve: There was trivial regurgitation. - Pulmonary arteries: Systolic pressure was within the normal range. PA peak pressure: 32 mm Hg (S). - Inferior vena cava: The vessel was normal in size. - Pericardium, extracardiac: There was no pericardial effusion.   Assessment and Plan:  1. Asymptomatic afib, longstanding  Chadsvasc score of at least 3, continue pradaxa Continue dilt at 360 mg  daily with 30 mg tablet as needed  2. [facility] [address] [place], [place] [address] [phone]  F/u in [facility] in 6 months

## 2015-04-22 ENCOUNTER — Ambulatory Visit (INDEPENDENT_AMBULATORY_CARE_PROVIDER_SITE_OTHER): Payer: Medicare Other | Admitting: Internal Medicine

## 2015-04-22 ENCOUNTER — Encounter: Payer: Self-pay | Admitting: Internal Medicine

## 2015-04-22 VITALS — BP 118/72 | HR 84 | Temp 98.0°F | Resp 16 | Wt 152.0 lb

## 2015-04-22 DIAGNOSIS — J310 Chronic rhinitis: Secondary | ICD-10-CM

## 2015-04-22 DIAGNOSIS — D170 Benign lipomatous neoplasm of skin and subcutaneous tissue of head, face and neck: Secondary | ICD-10-CM | POA: Diagnosis not present

## 2015-04-22 NOTE — Progress Notes (Signed)
   Subjective:    Patient ID: [name], female    DOB: 20-Aug-1945, 69 y.o.   MRN: [id number]  HPI 3 weeks ago she noted enlargement of the right posterior neck in the context of a "cold". In the last several days she's begun to have some postnasal drainage ;she's not visualized secretions. She has no other symptoms or signs of upper or lower respiratory tract infection. She has no constitutional symptoms.   She does have severe diffuse arthritis.   Review of Systems Frontal headache, facial pain , nasal purulence, dental pain, sore throat , otic pain or otic discharge denied. No fever , chills or sweats. Extrinsic symptoms of itchy, watery eyes, sneezing, or angioedema are denied. There is no significant cough, sputum production, wheezing,or  paroxysmal nocturnal dyspnea.    Objective:   Physical Exam  There is a 1 x 1 cm subcutaneous lesion in the right posterior neck. This is freely movable without attachment to the SQ tissues. It does transilluminate. She has marked erythema of the nasal mucosa, worse on the right than the left. She has mixed DIP/PIP arthritic changes of the hands.  General appearance:Adequately nourished; no acute distress or increased work of breathing is present.    Lymphatic: No definite lymphadenopathy about the head, neck, or axilla . No hepatosplenomegaly present.  Eyes: No conjunctival inflammation or lid edema is present. There is no scleral icterus.  Ears:  External ear exam shows no significant lesions or deformities.  Otoscopic examination reveals clear canals, tympanic membranes are intact bilaterally without bulging, retraction, inflammation or discharge.  Nose:  External nasal examination shows no deformity or inflammation. No septal dislocation or deviation.No obstruction to airflow.   Oral exam: Dental hygiene is good; lips and gums are healthy appearing.There is no oropharyngeal erythema or exudate .  Neck:  No deformities, thyromegaly,  masses, or tenderness noted.   Supple with full range of motion without pain.   Heart:  Normal rate and regular rhythm. S1 and S2 normal without gallop, murmur, click, rub or other extra sounds.   Lungs:Chest clear to auscultation; no wheezes, rhonchi,rales ,or rubs present.  Extremities:  No cyanosis, edema, or clubbing  noted    Skin: Warm & dry w/o tenting or jaundice. No significant lesions or rash.      Assessment & Plan:  #1 lipoma right posterior neck in the context of diffuse arthritis  #2 nonallergic rhinitis  See plan

## 2015-04-22 NOTE — Patient Instructions (Signed)
Plain Mucinex (NOT D) for thick secretions ;force NON dairy fluids .   Nasal cleansing in the shower as discussed with lather of mild shampoo.After 10 seconds wash off lather while  exhaling through nostrils. Make sure that all residual soap is removed to prevent irritation.  Fluticasone 1 spray in each nostril twice a day as needed. Use the "crossover" technique into opposite nostril spraying toward opposite ear @ 45 degree angle, not straight up into nostril.  Use a Neti pot daily only  as needed for significant sinus congestion; going from open side to congested side . Plain Allegra (NOT D )  160 daily , Loratidine 10 mg , OR Zyrtec 10 mg @ bedtime  as needed for itchy eyes & sneezing.    Zicam Melts or Zinc lozenges as per package label for sore throat .  Complementary options to boost immunity include  vitamin C 2000 mg daily; & Echinacea for 4-7 days.  Report fever; discolored nasal or chest secretions; or frontal headache or facial pain .

## 2015-04-22 NOTE — Progress Notes (Signed)
Pre visit review using our clinic review tool, if applicable. No additional management support is needed unless otherwise documented below in the visit note. 

## 2015-05-03 ENCOUNTER — Other Ambulatory Visit (INDEPENDENT_AMBULATORY_CARE_PROVIDER_SITE_OTHER): Payer: Medicare Other

## 2015-05-03 ENCOUNTER — Encounter: Payer: Self-pay | Admitting: Internal Medicine

## 2015-05-03 ENCOUNTER — Ambulatory Visit (INDEPENDENT_AMBULATORY_CARE_PROVIDER_SITE_OTHER): Payer: Medicare Other | Admitting: Internal Medicine

## 2015-05-03 VITALS — BP 108/60 | HR 71 | Temp 97.9°F | Ht 64.0 in | Wt 153.0 lb

## 2015-05-03 DIAGNOSIS — L659 Nonscarring hair loss, unspecified: Secondary | ICD-10-CM

## 2015-05-03 DIAGNOSIS — Z23 Encounter for immunization: Secondary | ICD-10-CM

## 2015-05-03 LAB — HEPATIC FUNCTION PANEL
ALBUMIN: 4.4 g/dL (ref 3.5–5.2)
ALT: 15 U/L (ref 0–35)
AST: 19 U/L (ref 0–37)
Alkaline Phosphatase: 72 U/L (ref 39–117)
Bilirubin, Direct: 0.1 mg/dL (ref 0.0–0.3)
TOTAL PROTEIN: 7.1 g/dL (ref 6.0–8.3)
Total Bilirubin: 0.6 mg/dL (ref 0.2–1.2)

## 2015-05-03 LAB — CBC WITH DIFFERENTIAL/PLATELET
BASOS ABS: 0.1 10*3/uL (ref 0.0–0.1)
BASOS PCT: 0.6 % (ref 0.0–3.0)
Eosinophils Absolute: 0.1 10*3/uL (ref 0.0–0.7)
Eosinophils Relative: 1 % (ref 0.0–5.0)
HEMATOCRIT: 45.4 % (ref 36.0–46.0)
Hemoglobin: 15.4 g/dL — ABNORMAL HIGH (ref 12.0–15.0)
LYMPHS ABS: 2.8 10*3/uL (ref 0.7–4.0)
Lymphocytes Relative: 27.1 % (ref 12.0–46.0)
MCHC: 33.8 g/dL (ref 30.0–36.0)
MCV: 92.7 fl (ref 78.0–100.0)
MONOS PCT: 6.1 % (ref 3.0–12.0)
Monocytes Absolute: 0.6 10*3/uL (ref 0.1–1.0)
NEUTROS ABS: 6.8 10*3/uL (ref 1.4–7.7)
NEUTROS PCT: 65.2 % (ref 43.0–77.0)
PLATELETS: 246 10*3/uL (ref 150.0–400.0)
RBC: 4.9 Mil/uL (ref 3.87–5.11)
RDW: 13.8 % (ref 11.5–15.5)
WBC: 10.4 10*3/uL (ref 4.0–10.5)

## 2015-05-03 LAB — BASIC METABOLIC PANEL
BUN: 23 mg/dL (ref 6–23)
CALCIUM: 9.6 mg/dL (ref 8.4–10.5)
CO2: 29 mEq/L (ref 19–32)
Chloride: 103 mEq/L (ref 96–112)
Creatinine, Ser: 0.87 mg/dL (ref 0.40–1.20)
GFR: 68.79 mL/min (ref >60.00)
Glucose, Bld: 91 mg/dL (ref 70–99)
POTASSIUM: 3.5 meq/L (ref 3.5–5.1)
SODIUM: 141 meq/L (ref 135–145)

## 2015-05-03 LAB — FERRITIN: FERRITIN: 25 ng/mL (ref 10.0–291.0)

## 2015-05-03 LAB — TSH: TSH: 1.41 u[IU]/mL (ref 0.35–4.50)

## 2015-05-03 MED ORDER — LISINOPRIL 40 MG PO TABS: ORAL_TABLET | ORAL | Status: DC

## 2015-05-03 MED ORDER — DABIGATRAN ETEXILATE MESYLATE 150 MG PO CAPS: 150.0000 mg | ORAL_CAPSULE | Freq: Two times a day (BID) | ORAL | Status: DC

## 2015-05-03 MED ORDER — CREON 36000-114000 UNITS PO CPEP: 36000.0000 [IU] | ORAL_CAPSULE | Freq: Three times a day (TID) | ORAL | Status: DC

## 2015-05-03 MED ORDER — DILTIAZEM HCL ER COATED BEADS 360 MG PO CP24: 360.0000 mg | ORAL_CAPSULE | Freq: Every day | ORAL | Status: DC

## 2015-05-03 NOTE — Progress Notes (Signed)
Pre visit review using our clinic review tool, if applicable. No additional management support is needed unless otherwise documented below in the visit note. 

## 2015-05-03 NOTE — Patient Instructions (Addendum)
It was good to see you today.  We have reviewed your prior records including labs and tests today  Test(s) ordered today. Your results will be released to [facility] (or called to you) after review, usually within 72hours after test completion. If any changes need to be made, you will be notified at that same time.  Medications reviewed and updated, no changes recommended at this time.  we'll make referral to dermatology. Our office will contact you regarding appointment(s) once made. Alopecia Areata Alopecia areata is a self-destructing (autoimmune) disease that results in the loss of hair. In this condition your body's immune system attacks the hair follicle. The hair follicle is responsible for growing hair. Hair loss can occur on the scalp and other parts of the body. It usually starts as one or more small, round, smooth patches of hair loss. It occurs in males and females of all ages and races, but usually starts before age 9. The scalp is the most commonly affected area, but the beard or any hair-bearing site can be affected. This type of hair loss does not leave scars where the hair was lost.  Many people with alopecia areata only have a few patches of hair loss. In others, extensive patchy hair loss occurs. In a few people, all scalp hair is lost (alopecia totalis), or hair is lost from the entire scalp and body (alopecia universalis). No matter how widespread the hair loss, the hair follicles remain alive and are ready to resume normal hair production whenever they receive the correct signal. Hair re-growth may occur without treatment and can even restart after years of hair loss.  CAUSES  It is thought that something triggers the immune system to stop hair growth. It is not always known what the cause is. Some people have genetic markers that can increase the chance of developing alopecia areata. Alopecia areata often occurs in families whose members have had:  Asthma.  Hay fever.  Atopic  eczema.  Some autoimmune diseases may also be a trigger, such as:  Thyroid disease.  Diabetes.  Rheumatoid arthritis.  Lupus erythematosus.  Vitiligo.  Pernicious anemia.  Addison's disease. OTHER SYMPTOMS In some people, the nail beds may develop rows of tiny dents (stippling) or the nail beds can become distorted. Other than the hair and nail beds, no other body part is affected.  PROGNOSIS  Alopecia areata is not medically disabling. People with alopecia areata are usually in excellent health. Hair loss can be emotionally difficult. The [employer] has resources available to help individuals and families with alopecia areata. Their goal is to help people with the condition live full, productive lives. There are many successful, well-adjusted, contented people living with Alopecia areata. Alopecia areata can be overcome with:  A positive self image.  Sound medical facts.  The support of others, such as:  Sometimes professional counseling is helpful to develop one's self-confidence and positive self-image. TREATMENT  There is no cure for alopecia areata. There are several available treatments. Treatments are most effective in milder cases. No treatment is effective for everyone. Choice of treatment depends mainly on a person's age and the extent of their hair loss. Alopecia areata occurs in two forms:   A mild patchy form where less than 50 percent of scalp hair is lost.  An extensive form where greater than 50 percent of scalp hair is lost. These two forms of alopecia areata behave quite differently, and the choice of treatment depends on which form is present.  Current treatments do not turn alopecia areata off. They can stimulate the hair follicle to produce hair.  Some medications used to treat mild cases include:  Cortisone injections. The most common treatment is the injection of cortisone into the bare skin patches. The injections are usually  given by a caregiver specializing in skin issues (dermatologist). This caregiver will use a tiny needle to give multiple injections into the skin in and around the bare patches. The injections are repeated once a month. If new hair growth occurs, it is usually visible within 4 weeks. Treatment does not prevent new patches of hair loss from developing. There are few side effects from local cortisone injections. Occasionally, temporary dents (depressions) in the skin result from the local injections, but these dents can fill in by themselves.  Topical minoxidil. Five percent topical minoxidil solution applied twice daily may grow hair in alopecia areata. Scalp, eyebrows, and beard hair may respond. If scalp hair re-grows completely, treatment can be stopped. Response may improve if topical cortisone cream is applied 30 minutes after the minoxidil. Topical minoxidil is safe, easy to use, and does not lower blood pressure in persons with normal blood pressure. Minoxidil can lead to unwanted facial hair growth in some people.  Anthralin cream or ointment. Another treatment is the application of anthralin cream or ointment. Anthralin is a tar-like substance that has been used widely for psoriasis. Anthralin is applied to the bare patches once daily. It is washed off after a short time, usually 30 to 60 minutes later. If new hair growth occurs, it is seen in 8 to 12 weeks. Anthralin can be irritating to the skin. It can cause temporary, brownish discoloration of the treated skin. By using short treatment times, skin irritation and skin staining are reduced without decreasing effectiveness. Care must be taken not to get anthralin in the eyes. Some of the medications used for more extensive cases where there is greater than 50% hair loss include:  Cortisone pills. Cortisone pills are sometimes given for extensive scalp hair loss. Cortisone taken internally is much stronger than local injections of cortisone into the  skin. It is necessary to discuss possible side effects of cortisone pills with your caregiver. In general, however, cortisone pills are used in relatively few patients with alopecia areata due to health risks from prolonged use. Also, hair that has grown is likely to fall out when the cortisone pills are stopped.  Topical minoxidil. See previous explanation under mild, patchy alopecia areata. However, minoxidil is not effective in total loss of scalp hair (alopecia totalis).  Topical immunotherapy. Another method of treating alopecia areata or alopecia totalis/universalis involves producing an allergic rash or allergic contact dermatitis. Chemicals such as diphencyprone (DPCP) or squaric acid dibutyl ester (SADBE) are applied to the scalp to produce an allergic rash which resembles poison oak or ivy. Approximately 40% of patients treated with topical immunotherapy will re-grow scalp hair after about 6 months of treatment. Those who do successfully re-grow scalp hair will need to continue treatment to maintain hair re-growth.  Wigs. For extensive hair loss, a wig can be an important option for some people. Proper attention will make a quality wig look completely natural. A wig will need to be cut, thinned, and styled. To keep a net base wig from falling off, special double-sided tape can be purchased in beauty supply outlets and fastened to the inside of the wig.  For those with completely bare heads, there are suction caps to which any wig  can be attached. There are also entire suction cap wig units. FOR MORE INFORMATION National Alopecia Areata Foundation: https://www.berry.org/ Document Released: 02/26/2004 Document Revised: 10/16/2011 Document Reviewed: 10/13/2013 [facility] Patient Information 2015 [place], [place]. This information is not intended to replace advice given to you by your health care provider. Make sure you discuss any questions you have with your health care provider.

## 2015-05-03 NOTE — Progress Notes (Signed)
Subjective:    Patient ID: [name], female    DOB: Dec 25, 1945, 69 y.o.   MRN: [id number]  HPI  Patient here for hair loss. Noted approximately 2 weeks ago. Emotional stressors reviewed including death and funeral of mother 1 month ago. Chemical coloring of hair done 4 weeks ago prior to funeral. No other medication or food changes; no travel or exposures; uncertain if any other hair loss ongoing across body including pubic region, extremities and eyebrows  Past Medical History  Diagnosis Date  . Mixed hyperlipidemia   . MITRAL VALVE PROLAPSE   . HYPERTENSION   . Atrial fibrillation     chronic anticoag - Pradaxa  . BURSITIS, SUBTROCHANTERIC   . ARTHRITIS, HIPS, BILATERAL     Review of Systems  Constitutional: Positive for fatigue. Negative for fever and unexpected weight change.  Eyes: Negative for pain.  Respiratory: Negative for cough and shortness of breath.   Cardiovascular: Negative for chest pain and leg swelling.  Musculoskeletal: Negative for joint swelling, arthralgias and gait problem.  Skin: Negative for color change, pallor, rash and wound.       Objective:    Physical Exam  Constitutional: She appears well-developed and well-nourished. No distress.  Cardiovascular: Normal rate, regular rhythm and normal heart sounds.   No murmur heard. Pulmonary/Chest: Effort normal and breath sounds normal. No respiratory distress.  Musculoskeletal: She exhibits no edema.  Skin:  Scalp with patchy scattered baldness, no evidence of broken hairs -fine hair detaches relatively easy with mild pulling pressure -no erythema, ulceration or inflammation noted. Eyebrows thin but intact. Fine hair on arms and legs apparently normal in distribution    BP 108/60 mmHg  Pulse 71  Temp(Src) 97.9 F (36.6 C) (Oral)  Ht 5\' 4"  (1.626 m)  Wt 153 lb (69.4 kg)  BMI 26.25 kg/m2  SpO2 98% Wt Readings from Last 3 Encounters:  05/03/15 153 lb (69.4 kg)  04/22/15 152 lb (68.947 kg)   03/11/15 154 lb 6.4 oz (70.035 kg)    Lab Results  Component Value Date   WBC 7.8 10/14/2014   HGB 15.3* 10/14/2014   HCT 44.7 10/14/2014   PLT 240.0 10/14/2014   GLUCOSE 103* 10/14/2014   CHOL 186 10/14/2014   TRIG 122.0 10/14/2014   HDL 62.10 10/14/2014   LDLDIRECT 149.3 09/25/2008   LDLCALC 100* 10/14/2014   ALT 21 11/02/2014   AST 20 11/02/2014   NA 139 10/14/2014   K 3.1* 10/14/2014   CL 102 10/14/2014   CREATININE 0.79 10/14/2014   BUN 20 10/14/2014   CO2 28 10/14/2014   TSH 1.17 10/14/2014   INR 1.4 01/25/2010   HGBA1C 5.9 04/02/2014    No results found.     Assessment & Plan:   Hair loss. Patchy baldness progressive over the past 2 weeks. Pattern potential for alopecia reviewed. Check labs to exclude metabolic cause. No evidence of medications and treating the same. Last chemical hair treatment greater than 30 days ago. Patient instructed to avoid physical trauma such as tightfitting head where and to avoid chemical treatments until further evaluation. Referral to dermatology pending lab results today. Will treat metabolic issue if any identified, otherwise consider scalp biopsy and/or intradermal cortisone injections as indicated]  Problem List Items Addressed This Visit    None    Visit Diagnoses    Hair loss    -  Primary    Need for prophylactic vaccination and inoculation against influenza  Relevant Orders    Flu Vaccine QUAD 36+ mos IM (Completed)        Gwendolyn Grant, MD

## 2015-05-04 LAB — ANA: ANA: NEGATIVE

## 2015-05-17 DIAGNOSIS — L659 Nonscarring hair loss, unspecified: Secondary | ICD-10-CM | POA: Diagnosis not present

## 2015-05-17 DIAGNOSIS — Z86018 Personal history of other benign neoplasm: Secondary | ICD-10-CM | POA: Diagnosis not present

## 2015-06-09 ENCOUNTER — Other Ambulatory Visit: Payer: Self-pay

## 2015-06-09 ENCOUNTER — Other Ambulatory Visit: Payer: Self-pay | Admitting: Internal Medicine

## 2015-06-09 MED ORDER — PRAVASTATIN SODIUM 20 MG PO TABS: 20.0000 mg | ORAL_TABLET | Freq: Every day | ORAL | Status: DC

## 2015-06-14 ENCOUNTER — Other Ambulatory Visit: Payer: Self-pay | Admitting: *Deleted

## 2015-06-14 MED ORDER — TRAMADOL HCL 50 MG PO TABS: 50.0000 mg | ORAL_TABLET | Freq: Four times a day (QID) | ORAL | Status: DC | PRN

## 2015-07-26 DIAGNOSIS — D2262 Melanocytic nevi of left upper limb, including shoulder: Secondary | ICD-10-CM | POA: Diagnosis not present

## 2015-07-26 DIAGNOSIS — D485 Neoplasm of uncertain behavior of skin: Secondary | ICD-10-CM | POA: Diagnosis not present

## 2015-07-26 DIAGNOSIS — L659 Nonscarring hair loss, unspecified: Secondary | ICD-10-CM | POA: Diagnosis not present

## 2015-07-26 DIAGNOSIS — D225 Melanocytic nevi of trunk: Secondary | ICD-10-CM | POA: Diagnosis not present

## 2015-07-26 DIAGNOSIS — L821 Other seborrheic keratosis: Secondary | ICD-10-CM | POA: Diagnosis not present

## 2015-07-26 DIAGNOSIS — Z86018 Personal history of other benign neoplasm: Secondary | ICD-10-CM | POA: Diagnosis not present

## 2015-07-30 ENCOUNTER — Encounter: Payer: Self-pay | Admitting: Internal Medicine

## 2015-08-03 ENCOUNTER — Encounter: Payer: Self-pay | Admitting: Internal Medicine

## 2015-08-03 DIAGNOSIS — Z86018 Personal history of other benign neoplasm: Secondary | ICD-10-CM | POA: Insufficient documentation

## 2015-08-11 ENCOUNTER — Other Ambulatory Visit: Payer: Self-pay | Admitting: Internal Medicine

## 2015-08-11 ENCOUNTER — Telehealth: Payer: Self-pay | Admitting: *Deleted

## 2015-08-11 MED ORDER — DILTIAZEM HCL ER COATED BEADS 360 MG PO CP24: ORAL_CAPSULE | ORAL | Status: DC

## 2015-08-11 MED ORDER — DABIGATRAN ETEXILATE MESYLATE 150 MG PO CAPS: 150.0000 mg | ORAL_CAPSULE | Freq: Two times a day (BID) | ORAL | Status: DC

## 2015-08-11 NOTE — Telephone Encounter (Signed)
Received call pt states she will be needing refills on all her medications. Verified if pt has been set-up w/new provider and she stated no. Inform pt we can get her set-up w/new md, then we are able to send a 30 day to local pharmacy until she get estab w new provider. Pt has about 3 weeks left on her medications made arppt to see Dr. Quay Burow on 08/24/15@ 2:30...Johny Chess

## 2015-08-12 ENCOUNTER — Encounter: Payer: Self-pay | Admitting: Internal Medicine

## 2015-08-12 DIAGNOSIS — D2239 Melanocytic nevi of other parts of face: Secondary | ICD-10-CM | POA: Diagnosis not present

## 2015-08-12 DIAGNOSIS — L720 Epidermal cyst: Secondary | ICD-10-CM | POA: Diagnosis not present

## 2015-08-12 DIAGNOSIS — L739 Follicular disorder, unspecified: Secondary | ICD-10-CM | POA: Diagnosis not present

## 2015-08-12 DIAGNOSIS — D485 Neoplasm of uncertain behavior of skin: Secondary | ICD-10-CM | POA: Diagnosis not present

## 2015-08-20 ENCOUNTER — Other Ambulatory Visit: Payer: Self-pay

## 2015-08-20 DIAGNOSIS — Z1231 Encounter for screening mammogram for malignant neoplasm of breast: Secondary | ICD-10-CM

## 2015-08-25 ENCOUNTER — Ambulatory Visit (INDEPENDENT_AMBULATORY_CARE_PROVIDER_SITE_OTHER): Payer: Medicare Other | Admitting: Internal Medicine

## 2015-08-25 ENCOUNTER — Encounter: Payer: Self-pay | Admitting: Internal Medicine

## 2015-08-25 VITALS — BP 116/78 | HR 90 | Temp 98.1°F | Resp 16 | Wt 159.0 lb

## 2015-08-25 DIAGNOSIS — E782 Mixed hyperlipidemia: Secondary | ICD-10-CM | POA: Diagnosis not present

## 2015-08-25 DIAGNOSIS — I10 Essential (primary) hypertension: Secondary | ICD-10-CM

## 2015-08-25 DIAGNOSIS — I481 Persistent atrial fibrillation: Secondary | ICD-10-CM | POA: Diagnosis not present

## 2015-08-25 DIAGNOSIS — I4819 Other persistent atrial fibrillation: Secondary | ICD-10-CM

## 2015-08-25 DIAGNOSIS — L631 Alopecia universalis: Secondary | ICD-10-CM

## 2015-08-25 MED ORDER — HYDROCHLOROTHIAZIDE 25 MG PO TABS: 25.0000 mg | ORAL_TABLET | Freq: Every day | ORAL | Status: DC

## 2015-08-25 MED ORDER — PRAVASTATIN SODIUM 20 MG PO TABS: 20.0000 mg | ORAL_TABLET | Freq: Every day | ORAL | Status: DC

## 2015-08-25 MED ORDER — POTASSIUM CHLORIDE ER 10 MEQ PO TBCR: 10.0000 meq | EXTENDED_RELEASE_TABLET | Freq: Every day | ORAL | Status: DC

## 2015-08-25 MED ORDER — LISINOPRIL 40 MG PO TABS: ORAL_TABLET | ORAL | Status: DC

## 2015-08-25 NOTE — Progress Notes (Signed)
Pre visit review using our clinic review tool, if applicable. No additional management support is needed unless otherwise documented below in the visit note. 

## 2015-08-25 NOTE — Progress Notes (Signed)
Subjective:    Patient ID: [name], female    DOB: [birth year], 70 y.o.   MRN: [id number]  HPI She is here to establish with a new pcp.   Alopecia:  She has universal alopecia.  It started after her mom died.  She did see derm.  At this point she does not have any hair on her body.  She has swelling in her eyebrow area and the dermatologist biopsied it - it was inconclusive.  She will have another biopsy.  The dermatologist also recommended a possible ultrasound to see if there is a cyst there.  Hypertension: She is taking her medication daily. She is compliant with a low sodium diet.  She denies chest pain, palpitations, edema, shortness of breath and regular headaches. She is exercising regularly.  She does not monitor her blood pressure at home.    Hyperlipidemia: She is taking her medication daily. She is compliant with a low fat/cholesterol diet. She is exercising regularly. She denies myalgias.   Chronic Afib:  She follows with cardiology.  She is taking her medication daily.  She also has an as needed medication to take if she has symptoms.  She denies chest pain, palpitations.    Generalized arthritis:  She has generalized arthritis. She has been tested for rheumatoid arthritis in the past and it was negative. She takes tramadol as needed for the pain.  Medications and allergies reviewed with patient and updated if appropriate.  Patient Active Problem List   Diagnosis Date Noted  . 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 H/O dysplastic nevus 08/03/2015  . Obese 10/14/2014  . Degenerative lumbar disc 12/04/2013  . Hematuria, undiagnosed cause 09/02/2013  . Osteopenia 06/11/2013  . Primary generalized (osteo)arthritis 12/18/2011  . MIXED HYPERLIPIDEMIA 03/25/2009  . Essential hypertension 03/24/2009  . MITRAL VALVE PROLAPSE 03/24/2009  . ATRIAL FIBRILLATION 03/24/2009  . COCCYGEAL PAIN 02/12/2007  . Greater trochanteric bursitis of left hip 02/12/2007    Current  Outpatient Prescriptions on File Prior to Visit  Medication Sig Dispense Refill  . bifidobacterium infantis (ALIGN) capsule Take 1 capsule by mouth daily. 30 capsule 11  . cholecalciferol (VITAMIN D) 1000 UNITS tablet Take 2,000 Units by mouth daily.    [name] co-enzyme Q-10 50 MG capsule Take 50 mg by mouth daily.    . dabigatran (PRADAXA) 150 MG CAPS capsule Take 1 capsule (150 mg total) by mouth 2 (two) times daily. 180 capsule 2  . diltiazem (CARDIZEM CD) 360 MG 24 hr capsule Take 1 capsule by mouth  daily 90 capsule 2  . diltiazem (CARDIZEM) 30 MG tablet Take 1 tablet (30 mg total) by mouth as needed (for A Fib). 30 tablet 0  . traMADol (ULTRAM) 50 MG tablet Take 1 tablet (50 mg total) by mouth every 6 (six) hours as needed (pain). 60 tablet 3   No current facility-administered medications on file prior to visit.    Past Medical History  Diagnosis Date  . Mixed hyperlipidemia   . MITRAL VALVE PROLAPSE   . HYPERTENSION   . Atrial fibrillation (HCC)     chronic anticoag - Pradaxa  . BURSITIS, SUBTROCHANTERIC   . ARTHRITIS, HIPS, BILATERAL     Past Surgical History  Procedure Laterality Date  . Tonsillectomy    . Dilation and curettage of uterus      Social History   Social History  . Marital Status: Married    Spouse Name: N/A  . Number of Children: N/A  .  Years of Education: N/A   Social History Main Topics  . Smoking status: Never Smoker   . Smokeless tobacco: None  . Alcohol Use: No  . Drug Use: Yes  . Sexual Activity: Not Asked   Other Topics Concern  . None   Social History Narrative    Family History  Problem Relation Age of Onset  . Leukemia Father   . Hypertension Mother   . Heart failure Mother     Review of Systems  Constitutional: Negative for fever and chills.  Respiratory: Negative for cough, shortness of breath and wheezing.   Cardiovascular: Negative for chest pain, palpitations and leg swelling.  Musculoskeletal: Positive for arthralgias  (feet, hips, hands, neck, hips - arthritis). Negative for joint swelling.  Skin: Negative for rash.  Neurological: Negative for light-headedness and headaches.       Objective:   Filed Vitals:   08/25/15 1429  BP: 116/78  Pulse: 90  Temp: 98.1 F (36.7 C)  Resp: 16   Filed Weights   08/25/15 1429  Weight: 159 lb (72.122 kg)   Body mass index is 27.28 kg/(m^2).   Physical Exam Constitutional: Appears well-developed and well-nourished. No distress.  Neck: Neck supple. No tracheal deviation present. No thyromegaly present.  No carotid bruit. No cervical adenopathy.   Cardiovascular: Normal rate, regular rhythm and normal heart sounds.   No murmur heard.  No edema Pulmonary/Chest: Effort normal and breath sounds normal. No respiratory distress. No wheezes.  Skin: alopecia totalis     Assessment & Plan:   See Problem List for Assessment and Plan of chronic medical problems.

## 2015-08-25 NOTE — Assessment & Plan Note (Signed)
Blood pressure well controlled here today Continue current medications 

## 2015-08-25 NOTE — Assessment & Plan Note (Signed)
Taking pravastatin daily Check lipid panel annually

## 2015-08-25 NOTE — Patient Instructions (Addendum)
  We have reviewed your prior records including labs and tests today.  All other Health Maintenance issues reviewed.   All recommended immunizations and age-appropriate screenings are up-to-date.  No immunizations administered today.   Medications reviewed and updated.  No changes recommended at this time.  Your prescription(s) have been submitted to your pharmacy. Please take as directed and contact our office if you believe you are having problem(s) with the medication(s).  Please followup annually

## 2015-08-25 NOTE — Assessment & Plan Note (Signed)
Chronic atrial fibrillation Following with cardiology On chronic anticoagulation

## 2015-08-25 NOTE — Assessment & Plan Note (Signed)
Cause unknown Following with dermatology

## 2015-09-09 DIAGNOSIS — D485 Neoplasm of uncertain behavior of skin: Secondary | ICD-10-CM | POA: Diagnosis not present

## 2015-09-09 DIAGNOSIS — L986 Other infiltrative disorders of the skin and subcutaneous tissue: Secondary | ICD-10-CM | POA: Diagnosis not present

## 2015-09-13 ENCOUNTER — Ambulatory Visit (HOSPITAL_COMMUNITY): Payer: BLUE CROSS/BLUE SHIELD | Admitting: Nurse Practitioner

## 2015-09-14 ENCOUNTER — Ambulatory Visit (HOSPITAL_COMMUNITY)
Admission: RE | Admit: 2015-09-14 | Discharge: 2015-09-14 | Disposition: A | Discharge status: Home / Self Care | Payer: Medicare Other | Source: Ambulatory Visit | Attending: Nurse Practitioner | Admitting: Nurse Practitioner

## 2015-09-14 ENCOUNTER — Encounter (HOSPITAL_COMMUNITY): Payer: Self-pay | Admitting: Nurse Practitioner

## 2015-09-14 VITALS — BP 118/72 | HR 85 | Ht 64.0 in | Wt 161.6 lb

## 2015-09-14 DIAGNOSIS — M1990 Unspecified osteoarthritis, unspecified site: Secondary | ICD-10-CM | POA: Diagnosis not present

## 2015-09-14 DIAGNOSIS — I482 Chronic atrial fibrillation, unspecified: Secondary | ICD-10-CM

## 2015-09-14 DIAGNOSIS — Z7901 Long term (current) use of anticoagulants: Secondary | ICD-10-CM | POA: Diagnosis not present

## 2015-09-14 DIAGNOSIS — I341 Nonrheumatic mitral (valve) prolapse: Secondary | ICD-10-CM | POA: Diagnosis not present

## 2015-09-14 DIAGNOSIS — I4891 Unspecified atrial fibrillation: Secondary | ICD-10-CM | POA: Insufficient documentation

## 2015-09-14 DIAGNOSIS — E782 Mixed hyperlipidemia: Secondary | ICD-10-CM | POA: Insufficient documentation

## 2015-09-14 DIAGNOSIS — I10 Essential (primary) hypertension: Secondary | ICD-10-CM | POA: Diagnosis not present

## 2015-09-14 MED ORDER — DILTIAZEM HCL 30 MG PO TABS: 30.0000 mg | ORAL_TABLET | ORAL | Status: DC | PRN

## 2015-09-14 NOTE — Progress Notes (Signed)
Patient ID: [name], female   DOB: 07/09/1946, 70 y.o.   MRN: [id number]     Primary Care Physician: Binnie Rail, MD Referring Physician: Dr. Regenia Skeeter [name] is a 70 y.o. female with a h/o chronic longstanding afib here for f/u in the afib clinic. Overall she has done well and requires 30 mg tab of diltaizem very infrequently( about once a month, feels better after 10 mins).  She doesn't feel irregular heart beat, just a sensation of overall weakness.She did develop alopecia in 04-10-15, PCP and dermatologist have worked up but think it is due to stress from the death of her mother in 04-10-23.Otherwise, she can do her usual activities without any issues. Continuing blood thinner without bleeding.  Today, she denies symptoms of palpitations, chest pain, shortness of breath, orthopnea, PND, lower extremity edema, dizziness, presyncope, syncope, or neurologic sequela. The patient is tolerating medications without difficulties and is otherwise without complaint today.   Past Medical History  Diagnosis Date  . Mixed hyperlipidemia   . MITRAL VALVE PROLAPSE   . HYPERTENSION   . Atrial fibrillation (HCC)     chronic anticoag - Pradaxa  . BURSITIS, SUBTROCHANTERIC   . ARTHRITIS, HIPS, BILATERAL    Past Surgical History  Procedure Laterality Date  . Tonsillectomy    . Dilation and curettage of uterus      Current Outpatient Prescriptions  Medication Sig Dispense Refill  . bifidobacterium infantis (ALIGN) capsule Take 1 capsule by mouth daily. 30 capsule 11  . cholecalciferol (VITAMIN D) 1000 UNITS tablet Take 2,000 Units by mouth daily.    [name] co-enzyme Q-10 50 MG capsule Take 50 mg by mouth daily.    . dabigatran (PRADAXA) 150 MG CAPS capsule Take 1 capsule (150 mg total) by mouth 2 (two) times daily. 180 capsule 2  . diltiazem (CARDIZEM CD) 360 MG 24 hr capsule Take 1 capsule by mouth  daily 90 capsule 2  . diltiazem (CARDIZEM) 30 MG tablet Take 1 tablet (30 mg  total) by mouth as needed (for A Fib). 45 tablet 2  . hydrochlorothiazide (HYDRODIURIL) 25 MG tablet Take 1 tablet (25 mg total) by mouth daily. 90 tablet 3  . lisinopril (PRINIVIL,ZESTRIL) 40 MG tablet Take 1 tablet by mouth  daily 90 tablet 3  . potassium chloride (K-DUR) 10 MEQ tablet Take 1 tablet (10 mEq total) by mouth daily. 90 tablet 3  . pravastatin (PRAVACHOL) 20 MG tablet Take 1 tablet (20 mg total) by mouth daily. 90 tablet 3  . traMADol (ULTRAM) 50 MG tablet Take 1 tablet (50 mg total) by mouth every 6 (six) hours as needed (pain). 60 tablet 3   No current facility-administered medications for this encounter.    No Known Allergies  Social History   Social History  . Marital Status: Married    Spouse Name: N/A  . Number of Children: N/A  . Years of Education: N/A   Occupational History  . Not on file.   Social History Main Topics  . Smoking status: Never Smoker   . Smokeless tobacco: Not on file  . Alcohol Use: No  . Drug Use: Yes  . Sexual Activity: Not on file   Other Topics Concern  . Not on file   Social History Narrative    Family History  Problem Relation Age of Onset  . Leukemia Father   . Hypertension Mother   . Heart failure Mother     ROS- All systems  are reviewed and negative except as per the HPI above  Physical Exam: Filed Vitals:   09/14/15 0841  BP: 118/72  Pulse: 85  Height: 5\' 4"  (D34-534 m)  Weight: 161 lb 9.6 oz (73.301 kg)    GEN- The patient is well appearing, alert and oriented x 3 today.   Head- normocephalic, atraumatic Eyes-  Sclera clear, conjunctiva pink Ears- hearing intact Oropharynx- clear Neck- supple, no JVP Lymph- no cervical lymphadenopathy Lungs- Clear to ausculation bilaterally, normal work of breathing Heart- Regular rate and rhythm, no murmurs, rubs or gallops, PMI not laterally displaced GI- soft, NT, ND, + BS Extremities- no clubbing, cyanosis, or edema MS- no significant deformity or atrophy Skin- no  rash or lesion Psych- euthymic mood, full affect Neuro- strength and sensation are intact  EKG- Afib at 85 bpm, QRS 78 ms, QTc 414 ms.    Echo -2/16-Left ventricle: The cavity size was normal. There was mild concentric hypertrophy. Systolic function was normal. The estimated ejection fraction was in the range of 60% to 65%. Wall motion was normal; there were no regional wall motion abnormalities. The study was not technically sufficient to allow evaluation of LV diastolic dysfunction due to atrial fibrillation. - Aortic valve: Trileaflet; normal thickness leaflets. There was no regurgitation. - Aortic root: The aortic root was normal in size. - Mitral valve: Structurally normal valve. - Left atrium: The atrium was mildly dilated. - Right ventricle: The cavity size was normal. Wall thickness was normal. Systolic function was normal. - Right atrium: The atrium was normal in size. - Tricuspid valve: There was mild regurgitation. - Pulmonic valve: There was trivial regurgitation. - Pulmonary arteries: Systolic pressure was within the normal range. PA peak pressure: 32 mm Hg (S). - Inferior vena cava: The vessel was normal in size. - Pericardium, extracardiac: There was no pericardial effusion.   Assessment and Plan:  1. Asymptomatic afib, longstanding  Chadsvasc score of at least 3, continue pradaxa Continue dilt at 360 mg  daily with 30 mg tablet as needed  2. Htn Stable  3. Alopecia Per PCP/dermatologist  Geroge Baseman. [facility] [address] [place],  [address] [phone]  F/u in [facility] in 6 months

## 2015-09-21 ENCOUNTER — Ambulatory Visit
Admission: RE | Admit: 2015-09-21 | Discharge: 2015-09-21 | Disposition: A | Discharge status: Home / Self Care | Payer: Medicare Other | Source: Ambulatory Visit

## 2015-09-21 DIAGNOSIS — Z1231 Encounter for screening mammogram for malignant neoplasm of breast: Secondary | ICD-10-CM

## 2015-09-23 ENCOUNTER — Encounter: Payer: Self-pay | Admitting: Family

## 2015-10-12 DIAGNOSIS — H02831 Dermatochalasis of right upper eyelid: Secondary | ICD-10-CM | POA: Insufficient documentation

## 2015-10-12 DIAGNOSIS — H02834 Dermatochalasis of left upper eyelid: Secondary | ICD-10-CM

## 2015-10-17 ENCOUNTER — Encounter (HOSPITAL_COMMUNITY): Payer: Self-pay | Admitting: Emergency Medicine

## 2015-10-17 ENCOUNTER — Emergency Department (HOSPITAL_COMMUNITY)
Admission: EM | Admit: 2015-10-17 | Discharge: 2015-10-17 | Disposition: A | Discharge status: Home / Self Care | Payer: Medicare Other | Attending: Emergency Medicine | Admitting: Emergency Medicine

## 2015-10-17 DIAGNOSIS — S838X1A Sprain of other specified parts of right knee, initial encounter: Secondary | ICD-10-CM

## 2015-10-17 DIAGNOSIS — M25561 Pain in right knee: Secondary | ICD-10-CM | POA: Diagnosis not present

## 2015-10-17 DIAGNOSIS — S8391XA Sprain of unspecified site of right knee, initial encounter: Secondary | ICD-10-CM | POA: Diagnosis not present

## 2015-10-17 DIAGNOSIS — I10 Essential (primary) hypertension: Secondary | ICD-10-CM | POA: Diagnosis not present

## 2015-10-17 DIAGNOSIS — S8991XA Unspecified injury of right lower leg, initial encounter: Secondary | ICD-10-CM | POA: Diagnosis present

## 2015-10-17 DIAGNOSIS — Z79899 Other long term (current) drug therapy: Secondary | ICD-10-CM | POA: Insufficient documentation

## 2015-10-17 DIAGNOSIS — Y9289 Other specified places as the place of occurrence of the external cause: Secondary | ICD-10-CM | POA: Insufficient documentation

## 2015-10-17 DIAGNOSIS — X58XXXA Exposure to other specified factors, initial encounter: Secondary | ICD-10-CM | POA: Insufficient documentation

## 2015-10-17 DIAGNOSIS — I4891 Unspecified atrial fibrillation: Secondary | ICD-10-CM | POA: Diagnosis not present

## 2015-10-17 DIAGNOSIS — Y9389 Activity, other specified: Secondary | ICD-10-CM | POA: Diagnosis not present

## 2015-10-17 DIAGNOSIS — M160 Bilateral primary osteoarthritis of hip: Secondary | ICD-10-CM | POA: Insufficient documentation

## 2015-10-17 DIAGNOSIS — Y998 Other external cause status: Secondary | ICD-10-CM | POA: Insufficient documentation

## 2015-10-17 DIAGNOSIS — S83206A Unspecified tear of unspecified meniscus, current injury, right knee, initial encounter: Secondary | ICD-10-CM | POA: Diagnosis not present

## 2015-10-17 DIAGNOSIS — E782 Mixed hyperlipidemia: Secondary | ICD-10-CM | POA: Insufficient documentation

## 2015-10-17 NOTE — ED Provider Notes (Signed)
CSN: [id number]     Arrival date & time 10/17/15  1612 History   First MD Initiated Contact with Patient 10/17/15 1639     Chief Complaint  Patient presents with  . Knee Pain     (Consider location/radiation/quality/duration/timing/severity/associated sxs/prior Treatment) HPI Patient was sitting with her leg draped over the arm of a chair. She was turning to reposition herself and she felt a pop in her knee and then had pain and difficulty bending it. She reports that once he gets into a standing position she confirmed weight on it without difficulty. Transitioning from a flexed position to standing however is very difficult. She also reports that bending the knee very tightly as painful. Pain is on both sides of the knee. There is not been any swelling. Past Medical History  Diagnosis Date  . Mixed hyperlipidemia   . MITRAL VALVE PROLAPSE   . HYPERTENSION   . Atrial fibrillation (HCC)     chronic anticoag - Pradaxa  . BURSITIS, SUBTROCHANTERIC   . ARTHRITIS, HIPS, BILATERAL    Past Surgical History  Procedure Laterality Date  . Tonsillectomy    . Dilation and curettage of uterus     Family History  Problem Relation Age of Onset  . Leukemia Father   . Hypertension Mother   . Heart failure Mother    Social History  Substance Use Topics  . Smoking status: Never Smoker   . Smokeless tobacco: None  . Alcohol Use: No   OB History    No data available     Review of Systems Constitutional: No fevers or chills.   Allergies  Review of patient's allergies indicates no known allergies.  Home Medications   Prior to Admission medications   Medication Sig Start Date End Date Taking? Authorizing Provider  bifidobacterium infantis (ALIGN) capsule Take 1 capsule by mouth daily. 10/14/14   Rowe Clack, MD  cholecalciferol (VITAMIN D) 1000 UNITS tablet Take 2,000 Units by mouth daily.    Historical Provider, MD  co-enzyme Q-10 50 MG capsule Take 50 mg by mouth daily.     Historical Provider, MD  dabigatran (PRADAXA) 150 MG CAPS capsule Take 1 capsule (150 mg total) by mouth 2 (two) times daily. 08/11/15   Thompson Grayer, MD  diltiazem (CARDIZEM CD) 360 MG 24 hr capsule Take 1 capsule by mouth  daily 08/11/15   Thompson Grayer, MD  diltiazem (CARDIZEM) 30 MG tablet Take 1 tablet (30 mg total) by mouth as needed (for A Fib). 09/14/15   Sherran Needs, NP  hydrochlorothiazide (HYDRODIURIL) 25 MG tablet Take 1 tablet (25 mg total) by mouth daily. 08/25/15   Binnie Rail, MD  lisinopril (PRINIVIL,ZESTRIL) 40 MG tablet Take 1 tablet by mouth  daily 08/25/15   Binnie Rail, MD  potassium chloride (K-DUR) 10 MEQ tablet Take 1 tablet (10 mEq total) by mouth daily. 08/25/15   Binnie Rail, MD  pravastatin (PRAVACHOL) 20 MG tablet Take 1 tablet (20 mg total) by mouth daily. 08/25/15   Binnie Rail, MD  traMADol (ULTRAM) 50 MG tablet Take 1 tablet (50 mg total) by mouth every 6 (six) hours as needed (pain). 06/14/15 06/14/18  Stefanie Libel, MD   BP 138/64 mmHg  Pulse 104  Temp(Src) 98.1 F (36.7 C) (Oral)  Resp 18  SpO2 100% Physical Exam  Constitutional: She is oriented to person, place, and time. She appears well-developed and well-nourished.  Patient nontoxic and alert in no acute distress.  Pulmonary/Chest:  Effort normal.  Musculoskeletal:  Patient can stand and bear weight bilaterally. She has difficulty walking with bending the knee. She can however walk with a straight leg gate on the right. No effusion present. Patient can flex the knee to 90 without difficulty. Beyond 90 there is discomfort along os medial and lateral joint lines. No pain to direct patellar pressure. No effusion. No lower leg edema or calf tenderness.  Neurological: She is alert and oriented to person, place, and time. She exhibits normal muscle tone. Coordination normal.  Skin: Skin is warm and dry.  Psychiatric: She has a normal mood and affect.    ED Course  Procedures (including critical care  time) Labs Review Labs Reviewed - No data to display  Imaging Review No results found. I have personally reviewed and evaluated these images and lab results as part of my medical decision-making.   EKG Interpretation None      MDM   Final diagnoses:  Meniscal injury, right, initial encounter   Most suspect a meniscal tear. She does not appear to have joint instability and mechanism is unlikely for larger ligamentous tear. Patient can weight-bear but not flex significantly. Patient has tramadol and Tylenol to use at home for pain. She will he placed in a short leg immobilizer with instructions for elevating and icing with follow-up with orthopedics.    Charlesetta Shanks, MD 10/17/15 [phone]

## 2015-10-17 NOTE — ED Notes (Signed)
Per pt, states she heard something pop when she got up-having right knee pain-able to bear weight

## 2015-10-17 NOTE — Discharge Instructions (Signed)
Suspected Meniscus Tear A meniscus tear is a knee injury in which a piece of the meniscus is torn. The meniscus is a thick, rubbery, wedge-shaped cartilage in the knee. Two menisci are located in each knee. They sit between the upper bone (femur) and lower bone (tibia) that make up the knee joint. Each meniscus acts as a shock absorber for the knee. A torn meniscus is one of the most common types of knee injuries. This injury can range from mild to severe. Surgery may be needed for a severe tear. CAUSES This injury may be caused by any squatting, twisting, or pivoting movement. Sports-related injuries are the most common cause. These often occur from:  Running and stopping suddenly.  Changing direction.  Being tackled or knocked off your feet. As people get older, their meniscus gets thinner and weaker. In these people, tears can happen more easily, such as from climbing stairs.  RISK FACTORS This injury is more likely to happen to:  People who play contact sports.  Males.  People who are 46-75 years of age. SYMPTOMS  Symptoms of this injury include:  Knee pain, especially at the side of the knee joint. You may feel pain when the injury occurs, or you may only hear a pop and feel pain later.  A feeling that your knee is clicking, catching, locking, or giving way.  Not being able to fully bend or extend your knee.  Bruising or swelling in your knee. DIAGNOSIS  This injury may be diagnosed based on your symptoms and a physical exam. The physical exam may include:  Moving your knee in different ways.  Feeling for tenderness.  Listening for a clicking sound.  Checking if your knee locks or catches. You may also have tests, such as:  X-rays.  MRI.  A procedure to look inside your knee with a narrow surgical telescope (arthroscopy). You may be referred to a knee specialist (orthopedic surgeon). TREATMENT  Treatment for this injury depends on the severity of the tear.  Treatment for a mild tear may include:  Rest.  Medicine to reduce pain and swelling. This is usually a nonsteroidal anti-inflammatory drug (NSAID).  A knee brace or an elastic sleeve or wrap.  Using crutches or a walker to keep weight off your knee and to help you walk.  Exercises to strengthen your knee (physical therapy). You may need surgery if you have a severe tear or if other treatments are not working.  HOME CARE INSTRUCTIONS Managing Pain and Swelling  Take over-the-counter and prescription medicines only as told by your health care provider.  If directed, apply ice to the injured area:  Put ice in a plastic bag.  Place a towel between your skin and the bag.  Leave the ice on for 20 minutes, 2-3 times per day.  Raise (elevate) the injured area above the level of your heart while you are sitting or lying down. Activity  Do not use the injured limb to support your body weight until your health care provider says that you can. Use crutches or a walker as told by your health care provider.  Return to your normal activities as told by your health care provider. Ask your health care provider what activities are safe for you.  Perform range-of-motion exercises only as told by your health care provider.  Begin doing exercises to strengthen your knee and leg muscles only as told by your health care provider. After you recover, your health care provider may recommend these exercises  to help prevent another injury. General Instructions  Use a knee brace or elastic wrap as told by your health care provider.  Keep all follow-up visits as told by your health care provider. This is important. SEEK MEDICAL CARE IF:  You have a fever.  Your knee becomes red, tender, or swollen.  Your pain medicine is not helping.  Your symptoms get worse or do not improve after 2 weeks of home care.   This information is not intended to replace advice given to you by your health care  provider. Make sure you discuss any questions you have with your health care provider.   Document Released: 10/14/2002 Document Revised: 04/14/2015 Document Reviewed: 11/16/2014 Elsevier Interactive Patient Education [employer].

## 2015-10-21 DIAGNOSIS — M25561 Pain in right knee: Secondary | ICD-10-CM | POA: Diagnosis not present

## 2015-11-11 DIAGNOSIS — M25561 Pain in right knee: Secondary | ICD-10-CM | POA: Diagnosis not present

## 2015-11-30 DIAGNOSIS — Z961 Presence of intraocular lens: Secondary | ICD-10-CM | POA: Diagnosis not present

## 2015-11-30 DIAGNOSIS — H524 Presbyopia: Secondary | ICD-10-CM | POA: Diagnosis not present

## 2015-12-08 ENCOUNTER — Encounter: Payer: Self-pay | Admitting: Family Medicine

## 2015-12-08 ENCOUNTER — Ambulatory Visit (INDEPENDENT_AMBULATORY_CARE_PROVIDER_SITE_OTHER): Payer: Medicare Other | Admitting: Family Medicine

## 2015-12-08 VITALS — BP 131/54 | HR 72 | Ht 64.0 in | Wt 160.0 lb

## 2015-12-08 DIAGNOSIS — M7062 Trochanteric bursitis, left hip: Secondary | ICD-10-CM | POA: Diagnosis present

## 2015-12-08 MED ORDER — PREDNISONE 20 MG PO TABS: ORAL_TABLET | ORAL | Status: DC

## 2015-12-08 NOTE — Progress Notes (Signed)
Patient ID: [name], female   DOB: September 10, 1945, 70 y.o.   MRN: [id number] CC: Medication refill  HPI: 70 yo woman here to request refill of prednisone. She uses it for prn pain medication related to hip inflammation/arthritis with prolonged walking and standing. She last used it a month ago for 2-3 days after walking at [employer]. This weekend she is traveling to [place] to help her sister with a sheep/wool festival and is anticipating worsening hip pain.  She has managed her pain like this for at least the past year. Hip pain is inside and lateral. Always hurt but flare with activity. She has arthritis in hands, back, feet and knees also.   No new injury, symptoms, weakness or complaints.   She got #21 prednisone, 20 mg, one year ago and still has three left.  She does not take NSAIDs due to blood thinners with a fib. She was recently diagnosed with alopecia and offered daily steroid for two months as treatment but she declined.  Six weeks ago she was seen in the ED and diagnosed with a right knee meniscal tear from a low impact twisting injury. She has since seen orthopaedic surgeon who advised conservative management. She takes Tylenol/tramadol occasionally for that with good relief. No complaints of problems with her knee today. She wears a compression sleeve and avoids certain movements. She was able to complete her aerobics class today.   ROS: No acute issues today, otherwise feels well.   PMH: Alopecia, a fib (Pradaxa, diltiazem), mitral valve prolapse, HTN (HCTZ, lisinopril), osteopenia, OA (Tylenol, Tramadol, VitD/Calcium). HLD (pravastatin)  PE: BP 131/54 mmHg  Pulse 72  Ht 5\' 4"  (1.626 m)  Wt 160 lb (72.576 kg)  BMI 27.45 kg/m2 GEN: WDWN woman, sitting in chair in NAD. Pleasant and cooperative. Wears scarf on head.  CV/PULM: Nonlabored breathing, peripheral circulation grossly intact, skin is warm and dry MSK: Ambulates without assistance. Right knee pain and  left hip pain with walking, wears compression sleeve on right knee. TTP along left greater troch c/w trochanteric bursitis.  A/P.  1. Chronic left hip pain. No new symptoms today, no trauma, no significant pain today just preparing for upcoming trip. Has OA and recurrent GT bursitis (today complaints are more c/w troch bursitis). Has been using oral prednisone for several days around flare for treatment. Believes it is the only thing which works and she takes it only sparingly. Refill prednisone today, advised to take for 3-4 days, avoid using more than every several weeks. Continue to use Tylenol and Tramadol for general pain control. Provided rehab sheets for hip strengthening.  She will try to do these exercises more regularly.    2. Right knee meniscus tear, subacute. Continue symptomatic management and activity modification. Got good relief from CSI. Follow up with ortho as directed. MRI with worsening or intolerable symptoms or lifestyle restriction.

## 2015-12-10 ENCOUNTER — Encounter: Payer: Self-pay | Admitting: Family Medicine

## 2015-12-14 ENCOUNTER — Telehealth: Payer: Self-pay | Admitting: Internal Medicine

## 2015-12-14 DIAGNOSIS — I10 Essential (primary) hypertension: Secondary | ICD-10-CM

## 2015-12-14 DIAGNOSIS — Z1159 Encounter for screening for other viral diseases: Secondary | ICD-10-CM

## 2015-12-14 DIAGNOSIS — E782 Mixed hyperlipidemia: Secondary | ICD-10-CM

## 2015-12-14 NOTE — Telephone Encounter (Signed)
Spoke with pt and okayed with MD to have routine blood work entered as well as Hep C screening.

## 2015-12-14 NOTE — Telephone Encounter (Signed)
Patient states My Chart is recommending a Hep C screening.  Please follow up with patient in regards.

## 2015-12-16 DIAGNOSIS — M7581 Other shoulder lesions, right shoulder: Secondary | ICD-10-CM | POA: Diagnosis not present

## 2016-02-07 DIAGNOSIS — D485 Neoplasm of uncertain behavior of skin: Secondary | ICD-10-CM | POA: Diagnosis not present

## 2016-02-07 DIAGNOSIS — Z411 Encounter for cosmetic surgery: Secondary | ICD-10-CM | POA: Diagnosis not present

## 2016-02-07 DIAGNOSIS — D2239 Melanocytic nevi of other parts of face: Secondary | ICD-10-CM | POA: Diagnosis not present

## 2016-02-10 DIAGNOSIS — D225 Melanocytic nevi of trunk: Secondary | ICD-10-CM | POA: Diagnosis not present

## 2016-02-10 DIAGNOSIS — D2262 Melanocytic nevi of left upper limb, including shoulder: Secondary | ICD-10-CM | POA: Diagnosis not present

## 2016-03-27 NOTE — Progress Notes (Addendum)
Subjective:   [name] is a 70 y.o. female who presents for Medicare Annual (Subsequent) preventive examination.  HRA assessment completed during this visit with Ms. Sirmon The Patient was informed that the wellness visit is to identify future health risk and educate and initiate measures that can reduce risk for increased disease through the lifespan.    NO ROS; Medicare Wellness Visit  Afib clinic annually   Psychosocial: (mother  HTN; HF; Father had Leukemia) Just got back for one month trip 3 weeks out [place]; just got back form [place] visiting family Also alopecia; last Sept Thought she had rh;   Tobacco Never smoked ETOH: none/ rare   Medical HX all under medical management MVP Hyperlipidemia/has lab work  Atrial fib  Medications reviewed for issues; compliance; otc meds  BMI: 28   Diet: Feels nutrition is fairly good Grilled chicken salad; has intestinal issues; diarrhea Colonoscopy normal;  Dr. Earlean Shawl is seeing; wasn't digesting oil;  If she does not eat a lot of sugar;  Grilled chicken; eggs;  Tried to avoid gluten x 1 month;  ( is also on blood thinner)   Affects life; want go to dinner On the trip had diapers in the car  Couldn't go hiking   Has chronic OA and bursitis;  Some things are red flags;  Takes prednisone when in pain   Exercise;  OA in feet and stopped playing tennis;  Can walk about a mile  Medical issues affecting health and involvement; Has to take Tylenol arthritis with Tramadol  Y x 3 times a week; aerobics; stretching; weights x 15  Spouse runs;    [place] term plan reviewed; one level  Home: level; barriers; or needs identified as bathroom railing or other review; Walk in shower No falls  Up at hs going to the bathroom x 3; can go back to sleep  Given education on "Fall Prevention in the Home" for more safety tips the patient can apply as appropriate.   Risk for Depression reviewed: Any emotional  problems? Anxious, depressed, irritable, sad or blue? no  Denies feeling depressed or hopeless; voices pleasure in daily life How many social activities have you been engaged in within the last 2 weeks? no High risk; due to bowel issues  Took care of mother x 5 years; very stressful;   Cognitive;   Manages checkbook, medications; no failures of task Ad8 score reviewed for issues;  Issues making decisions; no  Less interest in hobbies / activities" no  Repeats questions, stories; family complaining: NO  Trouble using ordinary gadgets; microwave; computer: no  Forgets the month or year: no  Mismanaging finances: no  Missing apt: no but does write them down  Daily problems with thinking of memory NO Ad8 score is 0  MMSE not appropriate unless AD8 score is > 2   Advanced Directive addressed; YES   Counseling Health Maintenance Gaps:  Hep C/ educated / will have drawn today Zostavax; she has had this; took as soon as she can get it / updated  Flu shot/ not yet/prefers to wait for high dose    Colonoscopy; due 02/2023 EKG: 09/2015 Mammogram: 09/2015 Dexa/ 06/2013/ does not recently- dr. Nori Riis; he is following and does in the office   PAP: educated regarding the need for GYN exam; goes to Dr. 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   Ophthalmology exam/ eye exam every year [facility]; has cataracts in both eyes Mother had detached  retina  Immunizations Due: (Vaccines reviewed and educated regarding any overdue)    Established and updated Risk reviewed and appropriate referral made or health recommendations:  Current Care Team reviewed and updated  Cardiac Risk Factors include: advanced age (>44men, >69 women);dyslipidemia;family history of premature cardiovascular disease;hypertension     Objective:     Vitals: BP 100/60   Pulse 73   Ht 5\' 4"  (1.626 m)   Wt 168 lb 6 oz (76.4 kg)   SpO2 96%   BMI 28.90 kg/m   Body mass index is 28.9  kg/m.   Tobacco History  Smoking Status  . Never Smoker  Smokeless Tobacco  . Not on file     Counseling given: Yes   Past Medical History:  Diagnosis Date  . ARTHRITIS, HIPS, BILATERAL   . Atrial fibrillation (HCC)    chronic anticoag - Pradaxa  . BURSITIS, SUBTROCHANTERIC   . HYPERTENSION   . MITRAL VALVE PROLAPSE   . Mixed hyperlipidemia    Past Surgical History:  Procedure Laterality Date  . DILATION AND CURETTAGE OF UTERUS    . TONSILLECTOMY     Family History  Problem Relation Age of Onset  . Hypertension Mother   . Heart failure Mother   . Leukemia Father    History  Sexual Activity  . Sexual activity: Not on file    Outpatient Encounter Prescriptions as of 03/28/2016  Medication Sig  . bifidobacterium infantis (ALIGN) capsule Take 1 capsule by mouth daily.  . cholecalciferol (VITAMIN D) 1000 UNITS tablet Take 2,000 Units by mouth daily.  [name] co-enzyme Q-10 50 MG capsule Take 50 mg by mouth daily.  . dabigatran (PRADAXA) 150 MG CAPS capsule Take 1 capsule (150 mg total) by mouth 2 (two) times daily.  [name] diltiazem (CARDIZEM CD) 360 MG 24 hr capsule Take 1 capsule by mouth  daily  . diltiazem (CARDIZEM) 30 MG tablet Take 1 tablet (30 mg total) by mouth as needed (for A Fib).  . hydrochlorothiazide (HYDRODIURIL) 25 MG tablet Take 1 tablet (25 mg total) by mouth daily.  [name] lisinopril (PRINIVIL,ZESTRIL) 40 MG tablet Take 1 tablet by mouth  daily  . potassium chloride (K-DUR) 10 MEQ tablet Take 1 tablet (10 mEq total) by mouth daily.  . pravastatin (PRAVACHOL) 20 MG tablet Take 1 tablet (20 mg total) by mouth daily.  . predniSONE (DELTASONE) 20 MG tablet Take 2 tablets a day for 3 days. Then take as needed for pain  . sodium fluoride (DENTA 5000 PLUS) 1.1 % CREA dental cream   . traMADol (ULTRAM) 50 MG tablet Take 1 tablet (50 mg total) by mouth every 6 (six) hours as needed (pain).   No facility-administered encounter medications on file as of 03/28/2016.      Activities of Daily Living In your present state of health, do you have any difficulty performing the following activities: 03/28/2016  Hearing? N  Vision? N  Difficulty concentrating or making decisions? N  Walking or climbing stairs? Y  Dressing or bathing? N  Doing errands, shopping? N  Preparing Food and eating ? N  Using the Toilet? N  In the past six months, have you accidently leaked urine? N  Do you have problems with loss of bowel control? N  Managing your Medications? N  Managing your Finances? N  Housekeeping or managing your Housekeeping? N  Some recent data might be hidden    Patient Care Team: Binnie Rail, MD as PCP - General (Internal Medicine) Vicente Males  Lynann Bologna, MD (Orthopedic Surgery) W Evette Cristal, MD (Obstetrics and Gynecology) Thompson Grayer, MD (Cardiology) Erline Levine, MD (Neurosurgery) Stefanie Libel, MD (Sports Medicine) Richmond Campbell, MD (Gastroenterology)    Assessment:     See notes below (Plan) Diarrhea interfering with life  Tylenol Arthritis x2 with Tramadol does help her discomfort;  Eating very bland food; BP down ? As a result of eating lean chicken, salad etc  Exercise Activities and Dietary recommendations Current Exercise Habits: Home exercise routine;Structured exercise class, Time (Minutes): 50, Frequency (Times/Week): 3, Weekly Exercise (Minutes/Week): 150, Intensity: Moderate  Goals    . patient          Will explore medical issues so she can get back to exercise as the treadmill.       Fall Risk Fall Risk  03/28/2016 12/08/2015 05/03/2015 10/14/2014 08/20/2014  Falls in the past year? No No No No No  Number falls in past yr: - - - - -  Injury with Fall? - - - - -  Risk for fall due to : - - - - Other (Comment)   Depression Screen PHQ 2/9 Scores 03/28/2016 12/08/2015 10/14/2014 08/20/2014  PHQ - 2 Score 0 0 0 0     Cognitive Testing MMSE - Mini Mental State Exam 03/28/2016  Not completed: (No Data)    Immunization History   Administered Date(s) Administered  . Influenza Split 04/07/2013  . Influenza,inj,Quad PF,36+ Mos 05/03/2015  . Influenza-Unspecified 05/07/2014  . Pneumococcal Conjugate-13 10/14/2014  . Pneumococcal Polysaccharide-23 05/15/2012  . Td 12/05/2005, 08/12/2012  . Zoster 08/07/2006   Screening Tests Health Maintenance  Topic Date Due  . Hepatitis C Screening  16-May-1946  . ZOSTAVAX  03/03/2007  . INFLUENZA VACCINE  03/07/2016  . MAMMOGRAM  09/20/2017  . TETANUS/TDAP  08/12/2022  . COLONOSCOPY  02/14/2023  . DEXA SCAN  Completed  . PNA vac Low Risk Adult  Completed      Plan:   States she will go to see Dr. Nori Riis for annual and discuss Rh doctor with him, as he had mentioned this in the past.  Requested to wait for high does flu shot and will take in Sept  Will make apt with Dr. Quay Burow if she can assist with ongoing treatment plan. STates diarrhea had interfered with her life and choices and slowed much of her activity. Hx of being active but had to take depends on trip with fear of diarrhea and eating a very bland diet.   Hep c drawn today   Historical record for zostavax in   During the course of the visit the patient was educated and counseled about the following appropriate screening and preventive services:   Vaccines to include Pneumoccal, Influenza, Hepatitis B, Td, Zostavax, HCV  Electrocardiogram  Cardiovascular Disease  Colorectal cancer screening  Bone density screening  Diabetes screening  Glaucoma screening  Mammography/PAP  Nutrition counseling   Patient Instructions (the written plan) was given to the patient.   Wynetta Fines, RN  03/28/2016    Medical screening examination/treatment/procedure(s) were performed by non-physician practitioner and as supervising physician I was immediately available for consultation/collaboration. I agree with above. Binnie Rail, MD

## 2016-03-28 ENCOUNTER — Other Ambulatory Visit (INDEPENDENT_AMBULATORY_CARE_PROVIDER_SITE_OTHER): Payer: Medicare Other

## 2016-03-28 ENCOUNTER — Other Ambulatory Visit: Payer: Self-pay | Admitting: Internal Medicine

## 2016-03-28 ENCOUNTER — Ambulatory Visit (INDEPENDENT_AMBULATORY_CARE_PROVIDER_SITE_OTHER): Payer: Medicare Other

## 2016-03-28 VITALS — BP 100/60 | HR 73 | Ht 64.0 in | Wt 168.4 lb

## 2016-03-28 DIAGNOSIS — E782 Mixed hyperlipidemia: Secondary | ICD-10-CM

## 2016-03-28 DIAGNOSIS — Z1159 Encounter for screening for other viral diseases: Secondary | ICD-10-CM | POA: Diagnosis not present

## 2016-03-28 DIAGNOSIS — I10 Essential (primary) hypertension: Secondary | ICD-10-CM | POA: Diagnosis not present

## 2016-03-28 DIAGNOSIS — Z0000 Encounter for general adult medical examination without abnormal findings: Secondary | ICD-10-CM | POA: Diagnosis not present

## 2016-03-28 LAB — LIPID PANEL
CHOL/HDL RATIO: 3
Cholesterol: 162 mg/dL (ref 0–200)
HDL: 59.9 mg/dL (ref >39.00)
LDL CALC: 78 mg/dL (ref 0–99)
NonHDL: 102.04
TRIGLYCERIDES: 121 mg/dL (ref 0.0–149.0)
VLDL: 24.2 mg/dL (ref 0.0–40.0)

## 2016-03-28 LAB — CBC WITH DIFFERENTIAL/PLATELET
BASOS ABS: 0 10*3/uL (ref 0.0–0.1)
BASOS PCT: 0.4 % (ref 0.0–3.0)
EOS ABS: 0.1 10*3/uL (ref 0.0–0.7)
Eosinophils Relative: 1.3 % (ref 0.0–5.0)
HCT: 45.8 % (ref 36.0–46.0)
Hemoglobin: 15.7 g/dL — ABNORMAL HIGH (ref 12.0–15.0)
LYMPHS ABS: 2.7 10*3/uL (ref 0.7–4.0)
Lymphocytes Relative: 28.2 % (ref 12.0–46.0)
MCHC: 34.4 g/dL (ref 30.0–36.0)
MCV: 93.8 fl (ref 78.0–100.0)
MONOS PCT: 8.4 % (ref 3.0–12.0)
Monocytes Absolute: 0.8 10*3/uL (ref 0.1–1.0)
NEUTROS ABS: 5.8 10*3/uL (ref 1.4–7.7)
NEUTROS PCT: 61.7 % (ref 43.0–77.0)
PLATELETS: 209 10*3/uL (ref 150.0–400.0)
RBC: 4.88 Mil/uL (ref 3.87–5.11)
RDW: 13.1 % (ref 11.5–15.5)
WBC: 9.4 10*3/uL (ref 4.0–10.5)

## 2016-03-28 LAB — BASIC METABOLIC PANEL
BUN: 15 mg/dL (ref 6–23)
CHLORIDE: 105 meq/L (ref 96–112)
CO2: 29 mEq/L (ref 19–32)
CREATININE: 0.84 mg/dL (ref 0.40–1.20)
Calcium: 9.3 mg/dL (ref 8.4–10.5)
GFR: 71.44 mL/min (ref >60.00)
Glucose, Bld: 91 mg/dL (ref 70–99)
Potassium: 3.6 mEq/L (ref 3.5–5.1)
Sodium: 143 mEq/L (ref 135–145)

## 2016-03-28 LAB — TSH: TSH: 1.07 u[IU]/mL (ref 0.35–4.50)

## 2016-03-28 NOTE — Patient Instructions (Addendum)
[name] , Thank you for taking time to come for your Medicare Wellness Visit. I appreciate your ongoing commitment to your health goals. Please review the following plan we discussed and let me know if I can assist you in the future.   Will take flu shot when "high does" is available  May consider a fup apt with Dr. Quay Burow to discuss options for 2nd opinion.  Will fup with Dexa with Dr. Nori Riis   These are the goals we discussed: Goals    . patient          Will explore medical issues so she can get back to exercise as the treadmill.        This is a list of the screening recommended for you and due dates:  Health Maintenance  Topic Date Due  .  Hepatitis C: One time screening is recommended by Center for Disease Control  (CDC) for  adults born from [birth year] through 1964.   Oct 10, 1945  . Shingles Vaccine  03/03/2007  . Flu Shot  03/07/2016  . Mammogram  09/20/2017  . Tetanus Vaccine  08/12/2022  . Colon Cancer Screening  02/14/2023  . DEXA scan (bone density measurement)  Completed  . Pneumonia vaccines  Completed    Fall Prevention in the Home  Falls can cause injuries. They can happen to people of all ages. There are many things you can do to make your home safe and to help prevent falls.  WHAT CAN I DO ON THE OUTSIDE OF MY HOME?  Regularly fix the edges of walkways and driveways and fix any cracks.  Remove anything that might make you trip as you walk through a door, such as a raised step or threshold.  Trim any bushes or trees on the path to your home.  Use bright outdoor lighting.  Clear any walking paths of anything that might make someone trip, such as rocks or tools.  Regularly check to see if handrails are loose or broken. Make sure that both sides of any steps have handrails.  Any raised decks and porches should have guardrails on the edges.  Have any leaves, snow, or ice cleared regularly.  Use sand or salt on walking paths during winter.  Clean up any  spills in your garage right away. This includes oil or grease spills. WHAT CAN I DO IN THE BATHROOM?   Use night lights.  Install grab bars by the toilet and in the tub and shower. Do not use towel bars as grab bars.  Use non-skid mats or decals in the tub or shower.  If you need to sit down in the shower, use a plastic, non-slip stool.  Keep the floor dry. Clean up any water that spills on the floor as soon as it happens.  Remove soap buildup in the tub or shower regularly.  Attach bath mats securely with double-sided non-slip rug tape.  Do not have throw rugs and other things on the floor that can make you trip. WHAT CAN I DO IN THE BEDROOM?  Use night lights.  Make sure that you have a light by your bed that is easy to reach.  Do not use any sheets or blankets that are too big for your bed. They should not hang down onto the floor.  Have a firm chair that has side arms. You can use this for support while you get dressed.  Do not have throw rugs and other things on the floor that can make  you trip. WHAT CAN I DO IN THE KITCHEN?  Clean up any spills right away.  Avoid walking on wet floors.  Keep items that you use a lot in easy-to-reach places.  If you need to reach something above you, use a strong step stool that has a grab bar.  Keep electrical cords out of the way.  Do not use floor polish or wax that makes floors slippery. If you must use wax, use non-skid floor wax.  Do not have throw rugs and other things on the floor that can make you trip. WHAT CAN I DO WITH MY STAIRS?  Do not leave any items on the stairs.  Make sure that there are handrails on both sides of the stairs and use them. Fix handrails that are broken or loose. Make sure that handrails are as long as the stairways.  Check any carpeting to make sure that it is firmly attached to the stairs. Fix any carpet that is loose or worn.  Avoid having throw rugs at the top or bottom of the stairs. If  you do have throw rugs, attach them to the floor with carpet tape.  Make sure that you have a light switch at the top of the stairs and the bottom of the stairs. If you do not have them, ask someone to add them for you. WHAT ELSE CAN I DO TO HELP PREVENT FALLS?  Wear shoes that:  Do not have high heels.  Have rubber bottoms.  Are comfortable and fit you well.  Are closed at the toe. Do not wear sandals.  If you use a stepladder:  Make sure that it is fully opened. Do not climb a closed stepladder.  Make sure that both sides of the stepladder are locked into place.  Ask someone to hold it for you, if possible.  Clearly mark and make sure that you can see:  Any grab bars or handrails.  First and last steps.  Where the edge of each step is.  Use tools that help you move around (mobility aids) if they are needed. These include:  Canes.  Walkers.  Scooters.  Crutches.  Turn on the lights when you go into a dark area. Replace any light bulbs as soon as they burn out.  Set up your furniture so you have a clear path. Avoid moving your furniture around.  If any of your floors are uneven, fix them.  If there are any pets around you, be aware of where they are.  Review your medicines with your doctor. Some medicines can make you feel dizzy. This can increase your chance of falling. Ask your doctor what other things that you can do to help prevent falls.   This information is not intended to replace advice given to you by your health care provider. Make sure you discuss any questions you have with your health care provider.   Document Released: 05/20/2009 Document Revised: 12/08/2014 Document Reviewed: 08/28/2014 Elsevier Interactive Patient Education 2016 [place] Maintenance, Female Adopting a healthy lifestyle and getting preventive care can go a long way to promote health and wellness. Talk with your health care provider about what schedule of regular  examinations is right for you. This is a good chance for you to check in with your provider about disease prevention and staying healthy. In between checkups, there are plenty of things you can do on your own. Experts have done a lot of research about which lifestyle changes and preventive measures  are most likely to keep you healthy. Ask your health care provider for more information. WEIGHT AND DIET  Eat a healthy diet  Be sure to include plenty of vegetables, fruits, low-fat dairy products, and lean protein.  Do not eat a lot of foods high in solid fats, added sugars, or salt.  Get regular exercise. This is one of the most important things you can do for your health.  Most adults should exercise for at least 150 minutes each week. The exercise should increase your heart rate and make you sweat (moderate-intensity exercise).  Most adults should also do strengthening exercises at least twice a week. This is in addition to the moderate-intensity exercise.  Maintain a healthy weight  Body mass index (BMI) is a measurement that can be used to identify possible weight problems. It estimates body fat based on height and weight. Your health care provider can help determine your BMI and help you achieve or maintain a healthy weight.  For females 29 years of age and older:   A BMI below 18.5 is considered underweight.  A BMI of 18.5 to 24.9 is normal.  A BMI of 25 to 29.9 is considered overweight.  A BMI of 30 and above is considered obese.  Watch levels of cholesterol and blood lipids  You should start having your blood tested for lipids and cholesterol at 69 years of age, then have this test every 5 years.  You may need to have your cholesterol levels checked more often if:  Your lipid or cholesterol levels are high.  You are older than 69 years of age.  You are at high risk for heart disease.  CANCER SCREENING   Lung Cancer  Lung cancer screening is recommended for adults  37-28 years old who are at high risk for lung cancer because of a history of smoking.  A yearly low-dose CT scan of the lungs is recommended for people who:  Currently smoke.  Have quit within the past 15 years.  Have at least a 30-pack-year history of smoking. A pack year is smoking an average of one pack of cigarettes a day for 1 year.  Yearly screening should continue until it has been 15 years since you quit.  Yearly screening should stop if you develop a health problem that would prevent you from having lung cancer treatment.  Breast Cancer  Practice breast self-awareness. This means understanding how your breasts normally appear and feel.  It also means doing regular breast self-exams. Let your health care provider know about any changes, no matter how small.  If you are in your 20s or 30s, you should have a clinical breast exam (CBE) by a health care provider every 1-3 years as part of a regular health exam.  If you are 56 or older, have a CBE every year. Also consider having a breast X-ray (mammogram) every year.  If you have a family history of breast cancer, talk to your health care provider about genetic screening.  If you are at high risk for breast cancer, talk to your health care provider about having an MRI and a mammogram every year.  Breast cancer gene (BRCA) assessment is recommended for women who have family members with BRCA-related cancers. BRCA-related cancers include:  Breast.  Ovarian.  Tubal.  Peritoneal cancers.  Results of the assessment will determine the need for genetic counseling and BRCA1 and BRCA2 testing. Cervical Cancer Your health care provider may recommend that you be screened regularly for cancer of  the pelvic organs (ovaries, uterus, and vagina). This screening involves a pelvic examination, including checking for microscopic changes to the surface of your cervix (Pap test). You may be encouraged to have this screening done every 3  years, beginning at age 42.  For women ages 15-65, health care providers may recommend pelvic exams and Pap testing every 3 years, or they may recommend the Pap and pelvic exam, combined with testing for human papilloma virus (HPV), every 5 years. Some types of HPV increase your risk of cervical cancer. Testing for HPV may also be done on women of any age with unclear Pap test results.  Other health care providers may not recommend any screening for nonpregnant women who are considered low risk for pelvic cancer and who do not have symptoms. Ask your health care provider if a screening pelvic exam is right for you.  If you have had past treatment for cervical cancer or a condition that could lead to cancer, you need Pap tests and screening for cancer for at least 20 years after your treatment. If Pap tests have been discontinued, your risk factors (such as having a new sexual partner) need to be reassessed to determine if screening should resume. Some women have medical problems that increase the chance of getting cervical cancer. In these cases, your health care provider may recommend more frequent screening and Pap tests. Colorectal Cancer  This type of cancer can be detected and often prevented.  Routine colorectal cancer screening usually begins at 69 years of age and continues through 69 years of age.  Your health care provider may recommend screening at an earlier age if you have risk factors for colon cancer.  Your health care provider may also recommend using home test kits to check for hidden blood in the stool.  A small camera at the end of a tube can be used to examine your colon directly (sigmoidoscopy or colonoscopy). This is done to check for the earliest forms of colorectal cancer.  Routine screening usually begins at age 22.  Direct examination of the colon should be repeated every 5-10 years through 69 years of age. However, you may need to be screened more often if early forms  of precancerous polyps or small growths are found. Skin Cancer  Check your skin from head to toe regularly.  Tell your health care provider about any new moles or changes in moles, especially if there is a change in a mole's shape or color.  Also tell your health care provider if you have a mole that is larger than the size of a pencil eraser.  Always use sunscreen. Apply sunscreen liberally and repeatedly throughout the day.  Protect yourself by wearing long sleeves, pants, a wide-brimmed hat, and sunglasses whenever you are outside. HEART DISEASE, DIABETES, AND HIGH BLOOD PRESSURE   High blood pressure causes heart disease and increases the risk of stroke. High blood pressure is more likely to develop in:  People who have blood pressure in the high end of the normal range (130-139/85-89 mm Hg).  People who are overweight or obese.  People who are African American.  If you are 70-78 years of age, have your blood pressure checked every 3-5 years. If you are 62 years of age or older, have your blood pressure checked every year. You should have your blood pressure measured twice--once when you are at a hospital or clinic, and once when you are not at a hospital or clinic. Record the average of the  two measurements. To check your blood pressure when you are not at a hospital or clinic, you can use:  An automated blood pressure machine at a pharmacy.  A home blood pressure monitor.  If you are between 80 years and 65 years old, ask your health care provider if you should take aspirin to prevent strokes.  Have regular diabetes screenings. This involves taking a blood sample to check your fasting blood sugar level.  If you are at a normal weight and have a low risk for diabetes, have this test once every three years after 69 years of age.  If you are overweight and have a high risk for diabetes, consider being tested at a younger age or more often. PREVENTING INFECTION  Hepatitis  B  If you have a higher risk for hepatitis B, you should be screened for this virus. You are considered at high risk for hepatitis B if:  You were born in a country where hepatitis B is common. Ask your health care provider which countries are considered high risk.  Your parents were born in a high-risk country, and you have not been immunized against hepatitis B (hepatitis B vaccine).  You have HIV or AIDS.  You use needles to inject street drugs.  You live with someone who has hepatitis B.  You have had sex with someone who has hepatitis B.  You get hemodialysis treatment.  You take certain medicines for conditions, including cancer, organ transplantation, and autoimmune conditions. Hepatitis C  Blood testing is recommended for:  Everyone born from [birth year] through 1964.  Anyone with known risk factors for hepatitis C. Sexually transmitted infections (STIs)  You should be screened for sexually transmitted infections (STIs) including gonorrhea and chlamydia if:  You are sexually active and are younger than 69 years of age.  You are older than 69 years of age and your health care provider tells you that you are at risk for this type of infection.  Your sexual activity has changed since you were last screened and you are at an increased risk for chlamydia or gonorrhea. Ask your health care provider if you are at risk.  If you do not have HIV, but are at risk, it may be recommended that you take a prescription medicine daily to prevent HIV infection. This is called pre-exposure prophylaxis (PrEP). You are considered at risk if:  You are sexually active and do not regularly use condoms or know the HIV status of your partner(s).  You take drugs by injection.  You are sexually active with a partner who has HIV. Talk with your health care provider about whether you are at high risk of being infected with HIV. If you choose to begin PrEP, you should first be tested for HIV. You should  then be tested every 3 months for as long as you are taking PrEP.  PREGNANCY   If you are premenopausal and you may become pregnant, ask your health care provider about preconception counseling.  If you may become pregnant, take 400 to 800 micrograms (mcg) of folic acid every day.  If you want to prevent pregnancy, talk to your health care provider about birth control (contraception). OSTEOPOROSIS AND MENOPAUSE   Osteoporosis is a disease in which the bones lose minerals and strength with aging. This can result in serious bone fractures. Your risk for osteoporosis can be identified using a bone density scan.  If you are 68 years of age or older, or if you are at risk  for osteoporosis and fractures, ask your health care provider if you should be screened.  Ask your health care provider whether you should take a calcium or vitamin D supplement to lower your risk for osteoporosis.  Menopause may have certain physical symptoms and risks.  Hormone replacement therapy may reduce some of these symptoms and risks. Talk to your health care provider about whether hormone replacement therapy is right for you.  HOME CARE INSTRUCTIONS   Schedule regular health, dental, and eye exams.  Stay current with your immunizations.   Do not use any tobacco products including cigarettes, chewing tobacco, or electronic cigarettes.  If you are pregnant, do not drink alcohol.  If you are breastfeeding, limit how much and how often you drink alcohol.  Limit alcohol intake to no more than 1 drink per day for nonpregnant women. One drink equals 12 ounces of beer, 5 ounces of wine, or 1 ounces of hard liquor.  Do not use street drugs.  Do not share needles.  Ask your health care provider for help if you need support or information about quitting drugs.  Tell your health care provider if you often feel depressed.  Tell your health care provider if you have ever been abused or do not feel safe at home.    This information is not intended to replace advice given to you by your health care provider. Make sure you discuss any questions you have with your health care provider.   Document Released: 02/06/2011 Document Revised: 08/14/2014 Document Reviewed: 06/25/2013 Elsevier Interactive Patient Education [employer].

## 2016-03-29 LAB — HEPATITIS C ANTIBODY: HCV Ab: NEGATIVE

## 2016-04-01 ENCOUNTER — Encounter: Payer: Self-pay | Admitting: Internal Medicine

## 2016-04-04 DIAGNOSIS — Z6830 Body mass index (BMI) 30.0-30.9, adult: Secondary | ICD-10-CM | POA: Diagnosis not present

## 2016-04-04 DIAGNOSIS — N390 Urinary tract infection, site not specified: Secondary | ICD-10-CM | POA: Diagnosis not present

## 2016-04-04 DIAGNOSIS — Z01419 Encounter for gynecological examination (general) (routine) without abnormal findings: Secondary | ICD-10-CM | POA: Diagnosis not present

## 2016-04-04 DIAGNOSIS — R351 Nocturia: Secondary | ICD-10-CM | POA: Diagnosis not present

## 2016-05-16 ENCOUNTER — Ambulatory Visit (INDEPENDENT_AMBULATORY_CARE_PROVIDER_SITE_OTHER): Payer: Medicare Other

## 2016-05-16 DIAGNOSIS — Z23 Encounter for immunization: Secondary | ICD-10-CM

## 2016-06-05 DIAGNOSIS — L630 Alopecia (capitis) totalis: Secondary | ICD-10-CM | POA: Insufficient documentation

## 2016-06-05 NOTE — Progress Notes (Signed)
*IMAGE* Office Visit Note  Patient: [name]             Date of Birth: March 04, 1946           MRN: [id number]             PCP: Binnie Rail, MD Referring: Evette Cristal MD Visit Date: 06/06/2016  Occupation: Retired Glass blower/designer for a [employer]    Subjective: Joint pain    History of Present Illness: [name] is a 70 y.o. female . Her mother passed away in the first week of September 2016. She noticed 2 weeks ago she lost almost all of her hair on her scalp and gradually she lost hair all over her body. She was seen by dermatologist to discuss a skin biopsy from her scalp and diagnosed her with alopecia totalis. She's tried minoxidil without much results she was offered prednisone and methotrexate but she declined. She has long-standing history of this disease of lumbar spine. She also has history of arthritis in her hands and feet which has been bothersome for multiple years. They gradually getting worse. She has seen Dr. Lynann Bologna for trochanteric bursae injections in the past which lasted for short period. She's also seen Dr. Oneida Alar at a sports medicine who referred her to physical therapy and also prescribed prednisone. She states the prednisone was helpful to relieve the joint pain. She occasionally has neck stiffness which response to when necessary prednisone use.  Activities of Daily Living:  Patient reports morning stiffness for 60 minutes.   Patient Reports nocturnal pain.  Difficulty dressing/grooming: Reports Difficulty climbing stairs: Reports Difficulty getting out of chair: Reports Difficulty using hands for taps, buttons, cutlery, and/or writing: Reports   Review of Systems  Constitutional: Positive for fatigue. Negative for night sweats, weight gain, weight loss and weakness.  HENT: Negative for mouth sores, trouble swallowing, trouble swallowing, mouth dryness and nose dryness.   Eyes: Negative for pain, redness, visual disturbance and dryness.   Respiratory: Negative for cough, shortness of breath and difficulty breathing.   Cardiovascular: Positive for hypertension and irregular heartbeat. Negative for chest pain, palpitations and swelling in legs/feet.  Gastrointestinal: Negative for blood in stool, constipation and diarrhea.  Endocrine: Negative for increased urination.  Genitourinary: Negative for vaginal dryness.  Musculoskeletal: Positive for arthralgias, joint pain, joint swelling, muscle weakness and morning stiffness. Negative for myalgias, muscle tenderness and myalgias.  Skin: Positive for hair loss. Negative for color change, rash, skin tightness, ulcers and sensitivity to sunlight.  Allergic/Immunologic: Negative for susceptible to infections.  Neurological: Negative for dizziness, memory loss and night sweats.  Hematological: Negative for swollen glands.  Psychiatric/Behavioral: Positive for sleep disturbance. Negative for depressed mood. The patient is not nervous/anxious.     PMFS History:  Patient Active Problem List   Diagnosis Date Noted  . Alopecia totalis 06/05/2016  . 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 H/O dysplastic nevus 08/03/2015  . Obese 10/14/2014  . Degenerative lumbar disc 12/04/2013  . Hematuria, undiagnosed cause 09/02/2013  . Osteopenia 06/11/2013  . Primary generalized (osteo)arthritis 12/18/2011  . MIXED HYPERLIPIDEMIA 03/25/2009  . Essential hypertension 03/24/2009  . MITRAL VALVE PROLAPSE 03/24/2009  . ATRIAL FIBRILLATION 03/24/2009  . COCCYGEAL PAIN 02/12/2007  . Greater trochanteric bursitis of left hip 02/12/2007    Past Medical History:  Diagnosis Date  . ARTHRITIS, HIPS, BILATERAL   . Atrial fibrillation (HCC)    chronic anticoag - Pradaxa  . BURSITIS, SUBTROCHANTERIC   . HYPERTENSION   .  MITRAL VALVE PROLAPSE   . Mixed hyperlipidemia     Family History  Problem Relation Age of Onset  . Hypertension Mother   . Heart failure Mother   . Leukemia Father    Past Surgical  History:  Procedure Laterality Date  . DILATION AND CURETTAGE OF UTERUS    . TONSILLECTOMY     Social History   Social History Narrative  . No narrative on file     Objective: Vital Signs: BP 116/66 (BP Location: Left Arm, Patient Position: Standing, Cuff Size: Large)   Pulse 89   Resp 14   Ht 5\' 3"  (1.6 m)   Wt 173 lb (78.5 kg)   BMI 30.65 kg/m    Physical Exam  Constitutional: She is oriented to person, place, and time. She appears well-developed and well-nourished.  HENT:  Head: Normocephalic and atraumatic.  Eyes: Conjunctivae and EOM are normal.  Neck: Normal range of motion.  Cardiovascular: Normal heart sounds and intact distal pulses.   Irregular rhythm   Pulmonary/Chest: Effort normal and breath sounds normal.  Abdominal: Soft. Bowel sounds are normal.  Lymphadenopathy:    She has no cervical adenopathy.  Neurological: She is alert and oriented to person, place, and time.  Skin: Skin is warm and dry. Capillary refill takes 2 to 3 seconds.  Alopecia totalis  Psychiatric: She has a normal mood and affect. Her behavior is normal.     Musculoskeletal Exam: C-spine was good range of motion, she had kyphosis, she has limited range of motion of her lumbar spine. Shoulder joints elbow joints wrist joints are good range of motion she has incomplete fist formation was thickening of bilateral PIP/DIP joints without any inflammation. She had good range of motion of her hip joints some warmth and palpation of her right knee joint. She has thickening of bilateral MTP joints PIP/DIP joints with no synovitis.  CDAI Exam: No CDAI exam completed.    Investigation: Findings:  05/03/2015 comprehensive metabolic panel normal, CBC normal, TSH normal, ferritin normal, ANA negative    Imaging: No results found.  Speciality Comments: No specialty comments available.    Procedures:  No procedures performed Allergies: Nickel   Assessment / Plan: Visit Diagnoses:   Alopecia totalis -she has had history of alopecia for 1 year now. There are dermatology workup has been negative so far. Plan: Sedimentation rate, ANA, Anti-DNA antibody, double-stranded.  Osteopenia: She's been followed by her GYN for that.  Degenerative lumbar disc: She has chronic discomfort in her lower back joint protection  was discussed.  Bilateral hand pain - Plan: XR Hand 2 View Left, XR Hand 2 View Right, Sedimentation rate, ANA, Anti-DNA antibody, double-stranded, Uric acid, Magnesium  Foot pain, bilateral - Plan: XR Foot 2 Views Left, XR Foot 2 Views Right  Chronic pain of both knees - Plan: XR KNEE 3 VIEW LEFT, XR KNEE 3 VIEW RIGHT  Fatigue, unspecified type - Plan: CBC with Differential/Platelet, COMPLETE METABOLIC PANEL WITH GFR    Orders: Orders Placed This Encounter  Procedures  . XR Foot 2 Views Left  . XR Foot 2 Views Right  . XR Hand 2 View Left  . XR Hand 2 View Right  . XR KNEE 3 VIEW LEFT  . XR KNEE 3 VIEW RIGHT  . CBC with Differential/Platelet  . COMPLETE METABOLIC PANEL WITH GFR  . Sedimentation rate  . ANA  . Anti-DNA antibody, double-stranded  . Uric acid  . Magnesium   Meds ordered this encounter  Medications  . ferrous sulfate 325 (65 FE) MG EC tablet    Sig: Take 325 mg by mouth daily.    Face-to-face time spent with patient was 30minutes. 50% of time was spent in counseling and coordination of care.  Follow-Up Instructions: Next available.   Bo Merino, MD

## 2016-06-06 ENCOUNTER — Encounter: Payer: Self-pay | Admitting: Rheumatology

## 2016-06-06 ENCOUNTER — Ambulatory Visit (INDEPENDENT_AMBULATORY_CARE_PROVIDER_SITE_OTHER): Payer: Medicare Other

## 2016-06-06 ENCOUNTER — Ambulatory Visit: Payer: Medicare Other | Admitting: Rheumatology

## 2016-06-06 ENCOUNTER — Ambulatory Visit (INDEPENDENT_AMBULATORY_CARE_PROVIDER_SITE_OTHER): Payer: Medicare Other | Admitting: Rheumatology

## 2016-06-06 VITALS — BP 116/66 | HR 89 | Resp 14 | Ht 63.0 in | Wt 173.0 lb

## 2016-06-06 DIAGNOSIS — M79671 Pain in right foot: Secondary | ICD-10-CM

## 2016-06-06 DIAGNOSIS — M79672 Pain in left foot: Secondary | ICD-10-CM | POA: Diagnosis not present

## 2016-06-06 DIAGNOSIS — R5383 Other fatigue: Secondary | ICD-10-CM

## 2016-06-06 DIAGNOSIS — M25561 Pain in right knee: Secondary | ICD-10-CM

## 2016-06-06 DIAGNOSIS — G8929 Other chronic pain: Secondary | ICD-10-CM

## 2016-06-06 DIAGNOSIS — M79641 Pain in right hand: Secondary | ICD-10-CM | POA: Diagnosis not present

## 2016-06-06 DIAGNOSIS — M5136 Other intervertebral disc degeneration, lumbar region: Secondary | ICD-10-CM

## 2016-06-06 DIAGNOSIS — M79642 Pain in left hand: Secondary | ICD-10-CM

## 2016-06-06 DIAGNOSIS — M25562 Pain in left knee: Secondary | ICD-10-CM

## 2016-06-06 DIAGNOSIS — M8580 Other specified disorders of bone density and structure, unspecified site: Secondary | ICD-10-CM

## 2016-06-06 DIAGNOSIS — M150 Primary generalized (osteo)arthritis: Secondary | ICD-10-CM | POA: Diagnosis not present

## 2016-06-06 DIAGNOSIS — L630 Alopecia (capitis) totalis: Secondary | ICD-10-CM | POA: Diagnosis not present

## 2016-06-06 LAB — CBC WITH DIFFERENTIAL/PLATELET
BASOS ABS: 0 {cells}/uL (ref 0–200)
Basophils Relative: 0 %
EOS ABS: 83 {cells}/uL (ref 15–500)
EOS PCT: 1 %
HEMATOCRIT: 45.3 % — AB (ref 35.0–45.0)
HEMOGLOBIN: 15.6 g/dL — AB (ref 11.7–15.5)
LYMPHS ABS: 1909 {cells}/uL (ref 850–3900)
Lymphocytes Relative: 23 %
MCH: 32.7 pg (ref 27.0–33.0)
MCHC: 34.4 g/dL (ref 32.0–36.0)
MCV: 95 fL (ref 80.0–100.0)
MONO ABS: 581 {cells}/uL (ref 200–950)
MPV: 10 fL (ref 7.5–12.5)
Monocytes Relative: 7 %
NEUTROS ABS: 5727 {cells}/uL (ref 1500–7800)
NEUTROS PCT: 69 %
Platelets: 221 10*3/uL (ref 140–400)
RBC: 4.77 MIL/uL (ref 3.80–5.10)
RDW: 13.9 % (ref 11.0–15.0)
WBC: 8.3 10*3/uL (ref 3.8–10.8)

## 2016-06-07 LAB — COMPLETE METABOLIC PANEL WITH GFR
ALBUMIN: 4.5 g/dL (ref 3.6–5.1)
ALK PHOS: 72 U/L (ref 33–130)
ALT: 15 U/L (ref 6–29)
AST: 21 U/L (ref 10–35)
BILIRUBIN TOTAL: 0.6 mg/dL (ref 0.2–1.2)
BUN: 19 mg/dL (ref 7–25)
CALCIUM: 9.4 mg/dL (ref 8.6–10.4)
CO2: 25 mmol/L (ref 20–31)
Chloride: 104 mmol/L (ref 98–110)
Creat: 0.95 mg/dL (ref 0.50–0.99)
GFR, EST NON AFRICAN AMERICAN: 61 mL/min (ref >=60)
GFR, Est African American: 71 mL/min (ref >=60)
GLUCOSE: 89 mg/dL (ref 65–99)
Potassium: 3.6 mmol/L (ref 3.5–5.3)
SODIUM: 142 mmol/L (ref 135–146)
TOTAL PROTEIN: 6.9 g/dL (ref 6.1–8.1)

## 2016-06-07 LAB — URIC ACID: Uric Acid, Serum: 7.6 mg/dL — ABNORMAL HIGH (ref 2.5–7.0)

## 2016-06-07 LAB — MAGNESIUM: Magnesium: 2 mg/dL (ref 1.5–2.5)

## 2016-06-07 LAB — SEDIMENTATION RATE: SED RATE: 1 mm/h (ref 0–30)

## 2016-06-07 LAB — ANTI-DNA ANTIBODY, DOUBLE-STRANDED: ds DNA Ab: <1 IU/mL

## 2016-06-07 LAB — ANA: Anti Nuclear Antibody(ANA): NEGATIVE

## 2016-06-21 ENCOUNTER — Ambulatory Visit (INDEPENDENT_AMBULATORY_CARE_PROVIDER_SITE_OTHER): Payer: Medicare Other | Admitting: Rheumatology

## 2016-06-21 ENCOUNTER — Inpatient Hospital Stay (INDEPENDENT_AMBULATORY_CARE_PROVIDER_SITE_OTHER): Payer: Medicare Other

## 2016-06-21 DIAGNOSIS — M79641 Pain in right hand: Secondary | ICD-10-CM

## 2016-06-21 DIAGNOSIS — L630 Alopecia (capitis) totalis: Secondary | ICD-10-CM | POA: Diagnosis not present

## 2016-06-21 DIAGNOSIS — M79642 Pain in left hand: Secondary | ICD-10-CM

## 2016-06-21 NOTE — Progress Notes (Signed)
*  IMAGE* Office Visit Note  Patient: [name]             Date of Birth: [birth year]           MRN: [id number]             PCP: Binnie Rail, MD Referring: Binnie Rail, MD Visit Date: 06/21/2016    Subjective:  Patient presents to the office for ultra sound of bilateral hands.  Investigation: No additional findings.   Imaging: [place] Extrem Up Bilat Comp  Result Date: 06/21/2016 Ultrasound examination of bilateral hands was performed per EULAR recommendations. Using 12 MHz transducer, grayscale and power Doppler bilateral second, third, and fifth MCP joints,right second and third digit PIP, DIP joints and bilateral wrist joints both dorsal and volar aspects were evaluated to look for synovitis or tenosynovitis. The findings were there was no synovitis or tenosynovitis on ultrasound examination.She had severe narrowing of right second and third finer PIP and DIP joints.Her right median nerve was 0.07 cm squares which was normal and left median nerve was 0.06 cm squares which was within normal limits. Impression: Ultrasound examination did not reveal inflammatory arthritis. Severe osteoarthritis noted in the examined PIPs and DIPs of her right hand.   Speciality Comments: No specialty comments available.   Procedures:  No procedures performed   Bo Merino

## 2016-07-10 DIAGNOSIS — M19042 Primary osteoarthritis, left hand: Secondary | ICD-10-CM

## 2016-07-10 DIAGNOSIS — M19071 Primary osteoarthritis, right ankle and foot: Secondary | ICD-10-CM | POA: Insufficient documentation

## 2016-07-10 DIAGNOSIS — M19072 Primary osteoarthritis, left ankle and foot: Secondary | ICD-10-CM

## 2016-07-10 DIAGNOSIS — M19041 Primary osteoarthritis, right hand: Secondary | ICD-10-CM | POA: Insufficient documentation

## 2016-07-10 NOTE — Progress Notes (Signed)
Office Visit Note  Patient: [name]             Date of Birth: January 23, 1946           MRN: [id number]             PCP: Binnie Rail, MD Referring: Binnie Rail, MD Visit Date: 07/11/2016 Occupation: '@GUAROCC' @    Subjective:  Pain of the Right Hand; Pain of the Left Hand; Pain of the Right Hip; Follow-up (lab xrays [place] review ); and Alopecia   History of Present Illness: [name] is a 70 y.o. female with history of osteoarthritis involving her hands and feet. She continues to have a lot of discomfort in her hands with decreased range of motion. She's been having discomfort in her right trochanteric area. She denies any acute joint swelling. She denies any history of psoriasis. She's also concerned about her alopecia.  Activities of Daily Living:  Patient reports morning stiffness for 5 minutes.   Patient Reports nocturnal pain.  Difficulty dressing/grooming: Denies Difficulty climbing stairs: Reports Difficulty getting out of chair: Reports Difficulty using hands for taps, buttons, cutlery, and/or writing: Reports   Review of Systems  Constitutional: Positive for fatigue. Negative for night sweats, weight gain, weight loss and weakness.  HENT: Negative for mouth sores, trouble swallowing, trouble swallowing, mouth dryness and nose dryness.   Eyes: Negative for pain, redness, visual disturbance and dryness.  Respiratory: Negative for cough, shortness of breath and difficulty breathing.   Cardiovascular: Positive for irregular heartbeat. Negative for chest pain, palpitations, hypertension and swelling in legs/feet.  Gastrointestinal: Negative for blood in stool, constipation and diarrhea.  Endocrine: Negative for increased urination.  Genitourinary: Negative for vaginal dryness.  Musculoskeletal: Positive for arthralgias, joint pain and morning stiffness. Negative for joint swelling, myalgias, muscle weakness, muscle tenderness and myalgias.  Skin: Negative for  color change, rash, hair loss, skin tightness, ulcers and sensitivity to sunlight.  Allergic/Immunologic: Negative for susceptible to infections.  Neurological: Negative for dizziness, memory loss and night sweats.  Hematological: Negative for swollen glands.  Psychiatric/Behavioral: Positive for sleep disturbance. Negative for depressed mood. The patient is not nervous/anxious.     PMFS History:  Patient Active Problem List   Diagnosis Date Noted  . Primary osteoarthritis of both feet 07/10/2016  . Primary osteoarthritis of both hands 07/10/2016  . Alopecia totalis 06/05/2016  . 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 H/O dysplastic nevus 08/03/2015  . Obese 10/14/2014  . Degenerative lumbar disc 12/04/2013  . Hematuria, undiagnosed cause 09/02/2013  . Osteopenia 06/11/2013  . Primary generalized (osteo)arthritis 12/18/2011  . MIXED HYPERLIPIDEMIA 03/25/2009  . Essential hypertension 03/24/2009  . MITRAL VALVE PROLAPSE 03/24/2009  . ATRIAL FIBRILLATION 03/24/2009  . COCCYGEAL PAIN 02/12/2007  . Greater trochanteric bursitis of left hip 02/12/2007    Past Medical History:  Diagnosis Date  . ARTHRITIS, HIPS, BILATERAL   . Atrial fibrillation (HCC)    chronic anticoag - Pradaxa  . BURSITIS, SUBTROCHANTERIC   . HYPERTENSION   . MITRAL VALVE PROLAPSE   . Mixed hyperlipidemia     Family History  Problem Relation Age of Onset  . Hypertension Mother   . Heart failure Mother   . Leukemia Father    Past Surgical History:  Procedure Laterality Date  . DILATION AND CURETTAGE OF UTERUS    . TONSILLECTOMY     Social History   Social History Narrative  . No narrative on file     Objective: Vital  Signs: BP 132/88   Pulse 64   Resp 14   Ht '5\' 3"'  (1.6 m)   Wt 174 lb (78.9 kg)   BMI 30.82 kg/m    Physical Exam  Constitutional: She is oriented to person, place, and time. She appears well-developed and well-nourished.  HENT:  Head: Normocephalic and atraumatic.  Eyes:  Conjunctivae and EOM are normal.  Neck: Normal range of motion.  Cardiovascular: Normal rate, regular rhythm, normal heart sounds and intact distal pulses.   Pulmonary/Chest: Effort normal and breath sounds normal.  Abdominal: Soft. Bowel sounds are normal.  Lymphadenopathy:    She has no cervical adenopathy.  Neurological: She is alert and oriented to person, place, and time.  Skin: Skin is warm and dry. Capillary refill takes less than 2 seconds.  Psychiatric: She has a normal mood and affect. Her behavior is normal.  Nursing note and vitals reviewed.    Musculoskeletal Exam: Limited flexion and extension of her C-spine, mild kyphosis of thoracic spine, limited range of motion of her lumbar spine. She has good range of motion of her shoulders, elbow joints, wrist joints she has incomplete fist formation bilaterally with subluxation of several of her PIP and DIP joints . No synovitis was noted. No nail dystrophy or nail pitting was noted. Hip joints, knee joints, ankle joints with good range of motion. She had PIP/DIP changes in her feet consistent with osteoarthritis.  CDAI Exam: No CDAI exam completed.    Investigation: Findings:  Result Date: 06/21/2016 Ultrasound examination of bilateral hands was performed per EULAR recommendations. Using 12 MHz transducer, grayscale and power Doppler bilateral second, third, and fifth MCP joints,right second and third digit PIP, DIP joints and bilateral wrist joints both dorsal and volar aspects were evaluated to look for synovitis or tenosynovitis. The findings were there was no synovitis or tenosynovitis on ultrasound examination.She had severe narrowing of right second and third finer PIP and DIP joints.Her right median nerve was 0.07 cm squares which was normal and left median nerve was 0.06 cm squares which was within normal limits. Impression: Ultrasound examination did not reveal inflammatory arthritis. Severe osteoarthritis noted in the examined  PIPs and DIPs of her right hand.  06/05/16 Sedimentation rate, ANA, Anti-DNA antibody, double-stranded normal Uric acid elevated 7.6        No visits with results within 1 Month(s) from this visit.  Latest known visit with results is:  Office Visit on 06/06/2016  Component Date Value Ref Range Status  . WBC 06/06/2016 8.3  3.8 - 10.8 K/uL Final  . RBC 06/06/2016 4.77  3.80 - 5.10 MIL/uL Final  . Hemoglobin 06/06/2016 15.6* 11.7 - 15.5 g/dL Final  . HCT 06/06/2016 45.3* 35.0 - 45.0 % Final  . MCV 06/06/2016 95.0  80.0 - 100.0 fL Final  . MCH 06/06/2016 32.7  27.0 - 33.0 pg Final  . MCHC 06/06/2016 34.4  32.0 - 36.0 g/dL Final  . RDW 06/06/2016 13.9  11.0 - 15.0 % Final  . Platelets 06/06/2016 221  140 - 400 K/uL Final  . MPV 06/06/2016 10.0  7.5 - 12.5 fL Final  . Neutro Abs 06/06/2016 5727  1,500 - 7,800 cells/uL Final  . Lymphs Abs 06/06/2016 1909  850 - 3,900 cells/uL Final  . Monocytes Absolute 06/06/2016 581  200 - 950 cells/uL Final  . Eosinophils Absolute 06/06/2016 83  15 - 500 cells/uL Final  . Basophils Absolute 06/06/2016 0  0 - 200 cells/uL Final  . Neutrophils Relative %  06/06/2016 69  % Final  . Lymphocytes Relative 06/06/2016 23  % Final  . Monocytes Relative 06/06/2016 7  % Final  . Eosinophils Relative 06/06/2016 1  % Final  . Basophils Relative 06/06/2016 0  % Final  . Smear Review 06/06/2016 Criteria for review not met   Final  . Sodium 06/07/2016 142  135 - 146 mmol/L Final  . Potassium 06/07/2016 3.6  3.5 - 5.3 mmol/L Final  . Chloride 06/07/2016 104  98 - 110 mmol/L Final  . CO2 06/07/2016 25  20 - 31 mmol/L Final  . Glucose, Bld 06/07/2016 89  65 - 99 mg/dL Final  . BUN 06/07/2016 19  7 - 25 mg/dL Final  . Creat 06/07/2016 0.95  0.50 - 0.99 mg/dL Final   Comment:   For patients > or = 69 years of age: The upper reference limit for Creatinine is approximately 13% higher for people identified as African-American.     . Total Bilirubin 06/07/2016 0.6   0.2 - 1.2 mg/dL Final  . Alkaline Phosphatase 06/07/2016 72  33 - 130 U/L Final  . AST 06/07/2016 21  10 - 35 U/L Final  . ALT 06/07/2016 15  6 - 29 U/L Final  . Total Protein 06/07/2016 6.9  6.1 - 8.1 g/dL Final  . Albumin 06/07/2016 4.5  3.6 - 5.1 g/dL Final  . Calcium 06/07/2016 9.4  8.6 - 10.4 mg/dL Final  . GFR, Est African American 06/07/2016 71  >=60 mL/min Final  . GFR, Est Non African American 06/07/2016 61  >=60 mL/min Final  . Sed Rate 06/07/2016 1  0 - 30 mm/hr Final  . Anit Nuclear Antibody(ANA) 06/07/2016 NEG  NEGATIVE Final  . ds DNA Ab 06/07/2016 <1  IU/mL Final   Comment:                                 IU/mL       Interpretation                               < or = 4    Negative                               5-9         Indeterminate                               > or = 10   Positive     . Uric Acid, Serum 06/07/2016 7.6* 2.5 - 7.0 mg/dL Final  . Magnesium 06/07/2016 2.0  1.5 - 2.5 mg/dL Final     Imaging: [place] Extrem Up Bilat Comp  Result Date: 06/21/2016 Ultrasound examination of bilateral hands was performed per EULAR recommendations. Using 12 MHz transducer, grayscale and power Doppler bilateral second, third, and fifth MCP joints,right second and third digit PIP, DIP joints and bilateral wrist joints both dorsal and volar aspects were evaluated to look for synovitis or tenosynovitis. The findings were there was no synovitis or tenosynovitis on ultrasound examination.She had severe narrowing of right second and third finer PIP and DIP joints.Her right median nerve was 0.07 cm squares which was normal and left median nerve was 0.06 cm squares which was within normal limits. Impression: Ultrasound examination  did not reveal inflammatory arthritis. Severe osteoarthritis noted in the examined PIPs and DIPs of her right hand.   Speciality Comments: No specialty comments available.  .[email or web address]  Procedures:  No procedures performed Allergies: Nickel   Assessment /  Plan:     Visit Diagnoses: Alopecia totalis - All autoimmune workup was negative which include ANA, DS DNA, ESR  Primary osteoarthritis of both hands - Severe erosive osteoarthritic changes on the x-rays of her PIP/DIP joints consistent with osteoarthritis, bilateral CMC narrowing, ultrasound negative for synovitis. Detailed counseling was provided. Head and muscle strengthening exercises were discussed. Joint protection was discussed.  Primary osteoarthritis of both feet - Osteoarthritic changes on x-rays of her feet with calcaneal spurs. Proper fitting shoes were discussed.  Greater trochanteric bursitis of left hip: I offered cortisone injection but she declined. She declined physical therapy as well. Have given her a handout for IT band exercises.  Osteopenia: Patient is aware and taking supplements.  Hyperuricemia - Asymptomatic . Patient reports family history of hyperuricemia. Dietary modification and weight loss was discussed.  She will contact us for any new concerns.   Orders: No orders of the defined types were placed in this encounter.  No orders of the defined types were placed in this encounter.   Face-to-face time spent with patient was 30 minutes. 50% of time was spent in counseling and coordination of care.  Follow-Up Instructions: Return in about 1 year (around 07/11/2017) for Osteoarthritis.   Bo Merino, MD

## 2016-07-11 ENCOUNTER — Ambulatory Visit (INDEPENDENT_AMBULATORY_CARE_PROVIDER_SITE_OTHER): Payer: Medicare Other | Admitting: Rheumatology

## 2016-07-11 ENCOUNTER — Encounter: Payer: Self-pay | Admitting: Rheumatology

## 2016-07-11 VITALS — BP 132/88 | HR 64 | Resp 14 | Ht 63.0 in | Wt 174.0 lb

## 2016-07-11 DIAGNOSIS — M19071 Primary osteoarthritis, right ankle and foot: Secondary | ICD-10-CM | POA: Diagnosis not present

## 2016-07-11 DIAGNOSIS — M8580 Other specified disorders of bone density and structure, unspecified site: Secondary | ICD-10-CM

## 2016-07-11 DIAGNOSIS — L630 Alopecia (capitis) totalis: Secondary | ICD-10-CM

## 2016-07-11 DIAGNOSIS — M19072 Primary osteoarthritis, left ankle and foot: Secondary | ICD-10-CM | POA: Diagnosis not present

## 2016-07-11 DIAGNOSIS — E790 Hyperuricemia without signs of inflammatory arthritis and tophaceous disease: Secondary | ICD-10-CM

## 2016-07-11 DIAGNOSIS — M19041 Primary osteoarthritis, right hand: Secondary | ICD-10-CM | POA: Diagnosis not present

## 2016-07-11 DIAGNOSIS — M7062 Trochanteric bursitis, left hip: Secondary | ICD-10-CM | POA: Diagnosis not present

## 2016-07-11 DIAGNOSIS — M19042 Primary osteoarthritis, left hand: Secondary | ICD-10-CM

## 2016-07-11 NOTE — Patient Instructions (Signed)
Supplements for OA Natural anti-inflammatories  You can purchase these at [employer], [employer] or online.  . Ginger (ginger root or capsules)  . Omega 3 (Fish, flax seeds, chia seeds, walnuts, almonds)  . Tart cherry (dried or extract)   Patient should be under the care of a physician while taking these supplements. This may not be reproduced without the permission of Dr. Bo Merino.   Iliotibial Band Syndrome Rehab Ask your health care provider which exercises are safe for you. Do exercises exactly as told by your health care provider and adjust them as directed. It is normal to feel mild stretching, pulling, tightness, or discomfort as you do these exercises, but you should stop right away if you feel sudden pain or your pain gets worse.Do not begin these exercises until told by your health care provider. Stretching and range of motion exercises These exercises warm up your muscles and joints and improve the movement and flexibility of your hip and pelvis. Exercise A: Quadriceps, prone 1. Lie on your abdomen on a firm surface, such as a bed or padded floor. 2. Bend your left / right knee and hold your ankle. If you cannot reach your ankle or pant leg, loop a belt around your foot and grab the belt instead. 3. Gently pull your heel toward your buttocks. Your knee should not slide out to the side. You should feel a stretch in the front of your thigh and knee. 4. Hold this position for __________ seconds. Repeat __________ times. Complete this stretch __________ times a day. Exercise B: Iliotibial band 1. Lie on your side with your left / right leg in the top position. 2. Bend both of your knees and grab your left / right ankle. Stretch out your bottom arm to help you balance. 3. Slowly bring your top knee back so your thigh goes behind your trunk. 4. Slowly lower your top leg toward the floor until you feel a gentle stretch on the outside of your left / right hip and thigh.  If you do not feel a stretch and your knee will not fall farther, place the heel of your other foot on top of your knee and pull your knee down toward the floor with your foot. 5. Hold this position for __________ seconds. Repeat __________ times. Complete this stretch __________ times a day. Strengthening exercises These exercises build strength and endurance in your hip and pelvis. Endurance is the ability to use your muscles for a long time, even after they get tired. Exercise C: Straight leg raises (hip abductors) 1. Lie on your side with your left / right leg in the top position. Lie so your head, shoulder, knee, and hip line up. You may bend your bottom knee to help you balance. 2. Roll your hips slightly forward so your hips are stacked directly over each other and your left / right knee is facing forward. 3. Tense the muscles in your outer thigh and lift your top leg 4-6 inches (10-15 cm). 4. Hold this position for __________ seconds. 5. Slowly return to the starting position. Let your muscles relax completely before doing another repetition. Repeat __________ times. Complete this exercise __________ times a day. Exercise D: Straight leg raises (hip extensors) 1. Lie on your abdomen on your bed or a firm surface. You can put a pillow under your hips if that is more comfortable. 2. Bend your left / right knee so your foot is straight up in the air. 3. Squeeze your buttock  muscles and lift your left / right thigh off the bed. Do not let your back arch. 4. Tense this muscle as hard as you can without increasing any knee pain. 5. Hold this position for __________ seconds. 6. Slowly lower your leg to the starting position and allow it to relax completely. Repeat __________ times. Complete this exercise __________ times a day. Exercise E: Hip hike 1. Stand sideways on a bottom step. Stand on your left / right leg with your other foot unsupported next to the step. You can hold onto the railing  or wall if needed for balance. 2. Keep your knees straight and your torso square. Then, lift your left / right hip up toward the ceiling. 3. Slowly let your left / right hip lower toward the floor, past the starting position. Your foot should get closer to the floor. Do not lean or bend your knees. Repeat __________ times. Complete this exercise __________ times a day. This information is not intended to replace advice given to you by your health care provider. Make sure you discuss any questions you have with your health care provider. Document Released: 07/24/2005 Document Revised: 03/28/2016 Document Reviewed: 06/25/2015 Elsevier Interactive Patient Education  2017 [employer].

## 2016-07-14 ENCOUNTER — Other Ambulatory Visit: Payer: Self-pay | Admitting: Internal Medicine

## 2016-07-27 ENCOUNTER — Encounter (HOSPITAL_COMMUNITY): Payer: Self-pay | Admitting: Nurse Practitioner

## 2016-07-27 ENCOUNTER — Ambulatory Visit (HOSPITAL_COMMUNITY)
Admission: RE | Admit: 2016-07-27 | Discharge: 2016-07-27 | Disposition: A | Discharge status: Home / Self Care | Payer: Medicare Other | Source: Ambulatory Visit | Attending: Nurse Practitioner | Admitting: Nurse Practitioner

## 2016-07-27 VITALS — BP 108/62 | HR 84 | Ht 63.0 in | Wt 175.2 lb

## 2016-07-27 DIAGNOSIS — I4891 Unspecified atrial fibrillation: Secondary | ICD-10-CM | POA: Insufficient documentation

## 2016-07-27 DIAGNOSIS — Z79899 Other long term (current) drug therapy: Secondary | ICD-10-CM | POA: Diagnosis not present

## 2016-07-27 DIAGNOSIS — I341 Nonrheumatic mitral (valve) prolapse: Secondary | ICD-10-CM | POA: Insufficient documentation

## 2016-07-27 DIAGNOSIS — M1990 Unspecified osteoarthritis, unspecified site: Secondary | ICD-10-CM | POA: Insufficient documentation

## 2016-07-27 DIAGNOSIS — Z7901 Long term (current) use of anticoagulants: Secondary | ICD-10-CM | POA: Diagnosis not present

## 2016-07-27 DIAGNOSIS — Z888 Allergy status to other drugs, medicaments and biological substances status: Secondary | ICD-10-CM | POA: Insufficient documentation

## 2016-07-27 DIAGNOSIS — Z8249 Family history of ischemic heart disease and other diseases of the circulatory system: Secondary | ICD-10-CM | POA: Diagnosis not present

## 2016-07-27 DIAGNOSIS — I482 Chronic atrial fibrillation, unspecified: Secondary | ICD-10-CM

## 2016-07-27 DIAGNOSIS — I10 Essential (primary) hypertension: Secondary | ICD-10-CM | POA: Diagnosis not present

## 2016-07-27 DIAGNOSIS — E782 Mixed hyperlipidemia: Secondary | ICD-10-CM | POA: Insufficient documentation

## 2016-07-27 DIAGNOSIS — Z9889 Other specified postprocedural states: Secondary | ICD-10-CM | POA: Diagnosis not present

## 2016-07-27 DIAGNOSIS — Z806 Family history of leukemia: Secondary | ICD-10-CM | POA: Diagnosis not present

## 2016-07-27 DIAGNOSIS — Z87891 Personal history of nicotine dependence: Secondary | ICD-10-CM | POA: Diagnosis not present

## 2016-07-27 MED ORDER — RIVAROXABAN 20 MG PO TABS: 20.0000 mg | ORAL_TABLET | Freq: Every day | ORAL | 0 refills | Status: DC

## 2016-07-27 MED ORDER — RIVAROXABAN 20 MG PO TABS: 20.0000 mg | ORAL_TABLET | Freq: Every day | ORAL | 2 refills | Status: DC

## 2016-07-27 NOTE — Patient Instructions (Signed)
Your physician has recommended you make the following change in your medication:  1)Stop Pradaxa 2)Start Xarelto 20mg  once a day with supper

## 2016-07-27 NOTE — Progress Notes (Signed)
Patient ID: [name], female   DOB: December 02, 1945, 70 y.o.   MRN: [id number]     Primary Care Physician: Kristin Rail, MD Referring Physician: Dr. Regenia [name] [name] is a 70 y.o. female with a h/o chronic longstanding afib here for f/u in the afib clinic. Overall she has done well and requires 30 mg tab of diltaizem very infrequently( about once a month, feels better after 10 mins).  She doesn't feel irregular heart beat, just a sensation of overall weakness.She did develop alopecia in 06-04-15, PCP and dermatologist have worked up but thought it iwas due to stress from the death of her mother in Jun 04, 2023.Otherwise, she can do her usual activities without any issues. Continuing blood thinner without bleeding.  She has return to the afib clinic to discuss change of anticoagulant. She has been on pradaxa for years and has done well with this but the cost is going up considerably next year. Her husband is on xarelto and she would like to transition over to this drug.  Today, she denies symptoms of palpitations, chest pain, shortness of breath, orthopnea, PND, lower extremity edema, dizziness, presyncope, syncope, or neurologic sequela. The patient is tolerating medications without difficulties and is otherwise without complaint today.   Past Medical History:  Diagnosis Date  . ARTHRITIS, HIPS, BILATERAL   . Atrial fibrillation (HCC)    chronic anticoag - Pradaxa  . BURSITIS, SUBTROCHANTERIC   . HYPERTENSION   . MITRAL VALVE PROLAPSE   . Mixed hyperlipidemia    Past Surgical History:  Procedure Laterality Date  . DILATION AND CURETTAGE OF UTERUS    . TONSILLECTOMY      Current Outpatient Prescriptions  Medication Sig Dispense Refill  . bifidobacterium infantis (ALIGN) capsule Take 1 capsule by mouth daily. 30 capsule 11  . cholecalciferol (VITAMIN D) 1000 UNITS tablet Take 2,000 Units by mouth daily.    [name] co-enzyme Q-10 50 MG capsule Take 50 mg by mouth daily.    [name]  diltiazem (CARDIZEM CD) 360 MG 24 hr capsule TAKE 1 CAPSULE DAILY 90 capsule 3  . diltiazem (CARDIZEM) 30 MG tablet Take 1 tablet (30 mg total) by mouth as needed (for A Fib). 45 tablet 2  . ferrous sulfate 325 (65 FE) MG EC tablet Take 325 mg by mouth daily.    . hydrochlorothiazide (HYDRODIURIL) 25 MG tablet Take 1 tablet (25 mg total) by mouth daily. 90 tablet 3  . lisinopril (PRINIVIL,ZESTRIL) 40 MG tablet Take 1 tablet by mouth  daily 90 tablet 3  . potassium chloride (K-DUR) 10 MEQ tablet Take 1 tablet (10 mEq total) by mouth daily. 90 tablet 3  . pravastatin (PRAVACHOL) 20 MG tablet Take 1 tablet (20 mg total) by mouth daily. 90 tablet 3  . sodium fluoride (DENTA 5000 PLUS) 1.1 % CREA dental cream     . traMADol (ULTRAM) 50 MG tablet Take 1 tablet (50 mg total) by mouth every 6 (six) hours as needed (pain). 60 tablet 3  . vitamin B-12 (CYANOCOBALAMIN) 500 MCG tablet Take 600 mcg by mouth daily.    . rivaroxaban (XARELTO) 20 MG TABS tablet Take 1 tablet (20 mg total) by mouth daily with supper. 30 tablet 0   No current facility-administered medications for this encounter.     Allergies  Allergen Reactions  . Nickel Rash    Social History   Social History  . Marital status: Married    Spouse name: N/A  . Number of  children: N/A  . Years of education: N/A   Occupational History  . Not on file.   Social History Main Topics  . Smoking status: Former Smoker    Years: 1.00  . Smokeless tobacco: Never Used     Comment: social age 84  . Alcohol use No  . Drug use: No  . Sexual activity: Not on file   Other Topics Concern  . Not on file   Social History Narrative  . No narrative on file    Family History  Problem Relation Age of Onset  . Hypertension Mother   . Heart failure Mother   . Leukemia Father     ROS- All systems are reviewed and negative except as per the HPI above  Physical Exam: Vitals:   07/27/16 1404  BP: 108/62  Pulse: 84  Weight: 175 lb 3.2 oz  (79.5 kg)  Height: 5\' 3"  (1.6 m)    GEN- The patient is well appearing, alert and oriented x 3 today.   Head- normocephalic, atraumatic Eyes-  Sclera clear, conjunctiva pink Ears- hearing intact Oropharynx- clear Neck- supple, no JVP Lymph- no cervical lymphadenopathy Lungs- Clear to ausculation bilaterally, normal work of breathing Heart- irregular rate and rhythm, no murmurs, rubs or gallops, PMI not laterally displaced GI- soft, NT, ND, + BS Extremities- no clubbing, cyanosis, or edema MS- no significant deformity or atrophy Skin- no rash or lesion Psych- euthymic mood, full affect Neuro- strength and sensation are intact  EKG- Afib at 84 bpm, QRS 78 ms, QTc 425 ms.    Echo -2/16-Left ventricle: The cavity size was normal. There was mild concentric hypertrophy. Systolic function was normal. The estimated ejection fraction was in the range of 60% to 65%. Wall motion was normal; there were no regional wall motion abnormalities. The study was not technically sufficient to allow evaluation of LV diastolic dysfunction due to atrial fibrillation. - Aortic valve: Trileaflet; normal thickness leaflets. There was no regurgitation. - Aortic root: The aortic root was normal in size. - Mitral valve: Structurally normal valve. - Left atrium: The atrium was mildly dilated. - Right ventricle: The cavity size was normal. Wall thickness was normal. Systolic function was normal. - Right atrium: The atrium was normal in size. - Tricuspid valve: There was mild regurgitation. - Pulmonic valve: There was trivial regurgitation. - Pulmonary arteries: Systolic pressure was within the normal range. PA peak pressure: 32 mm Hg (S). - Inferior vena cava: The vessel was normal in size. - Pericardium, extracardiac: There was no pericardial effusion.   Assessment and Plan:  1. Asymptomatic afib, longstanding  Chadsvasc score of at least 3 Stop pradaxa due to increase in cost  2018 Pt will change over to xarelto 20 mg daily(creat cl cal at 81) and instructed how to change over Continue dilt at 360 mg  daily with 30 mg tablet as needed  2. [name] [name] [address] [place], [place] [address] [phone]  F/u in [place] clinic as needed

## 2016-08-14 ENCOUNTER — Other Ambulatory Visit: Payer: Self-pay | Admitting: Internal Medicine

## 2016-09-02 ENCOUNTER — Other Ambulatory Visit: Payer: Self-pay | Admitting: Internal Medicine

## 2016-09-06 ENCOUNTER — Other Ambulatory Visit: Payer: Self-pay | Admitting: Obstetrics & Gynecology

## 2016-09-06 DIAGNOSIS — Z1231 Encounter for screening mammogram for malignant neoplasm of breast: Secondary | ICD-10-CM

## 2016-09-15 ENCOUNTER — Other Ambulatory Visit: Payer: Self-pay | Admitting: *Deleted

## 2016-09-24 ENCOUNTER — Other Ambulatory Visit: Payer: Self-pay | Admitting: Internal Medicine

## 2016-09-29 ENCOUNTER — Other Ambulatory Visit: Payer: Self-pay | Admitting: *Deleted

## 2016-09-29 MED ORDER — RIVAROXABAN 20 MG PO TABS: 20.0000 mg | ORAL_TABLET | Freq: Every day | ORAL | 1 refills | Status: DC

## 2016-09-29 NOTE — Telephone Encounter (Signed)
Pt called & stated she needed her Xarelto 20mg  refill; the pt Crea-0.95 on 06/06/16, 71yrs old, 78.5kg, CrCl-69.30ml/min. Spoke with pt & advised that the refill would be sent in at this time to her requested Pharmacy.

## 2016-10-09 ENCOUNTER — Telehealth: Payer: Self-pay | Admitting: *Deleted

## 2016-10-09 MED ORDER — HYDROCHLOROTHIAZIDE 25 MG PO TABS: 25.0000 mg | ORAL_TABLET | Freq: Every day | ORAL | 0 refills | Status: DC

## 2016-10-09 NOTE — Telephone Encounter (Signed)
Left msg on triage stating needing refill on her HCTZ. Pt is overdue for yearly physical will send in 30 day until appt on 3/26 to [facility].../lmb

## 2016-10-11 ENCOUNTER — Ambulatory Visit (INDEPENDENT_AMBULATORY_CARE_PROVIDER_SITE_OTHER): Payer: Medicare Other | Admitting: Family Medicine

## 2016-10-11 ENCOUNTER — Encounter: Payer: Self-pay | Admitting: Family Medicine

## 2016-10-11 DIAGNOSIS — M62838 Other muscle spasm: Secondary | ICD-10-CM

## 2016-10-11 MED ORDER — TRAMADOL HCL 50 MG PO TABS: 50.0000 mg | ORAL_TABLET | Freq: Three times a day (TID) | ORAL | 0 refills | Status: DC | PRN

## 2016-10-11 MED ORDER — PREDNISONE 10 MG PO TABS: ORAL_TABLET | ORAL | 0 refills | Status: DC

## 2016-10-11 NOTE — Assessment & Plan Note (Signed)
Likely as a spasm due to underlying arthritic changes within her neck. She denies any injury. - Tramadol #20 R0 provided - Prednisone six-day taper. - Advised to follow-up if having no improvement.

## 2016-10-11 NOTE — Progress Notes (Signed)
  [name] - 71 y.o. female MRN [id number]  Date of birth: 02/22/1946  SUBJECTIVE:  Including CC & ROS.   [name] is a 72 year old female that is presenting with neck pain and stiffness. She reports this started on Monday. She denies any underlying inciting event. She's been taking Tylenol and tramadol. She reports some improvement since it started. She has had a history of a similar pain. She was seen in 2016 for similar pain in this area. She points to pain in the left trapezius. She denies any injury or prior surgery to her neck.  ROS: No unexpected weight loss, fever, chills, swelling, instability, numbness/tingling, redness, otherwise see HPI    HISTORY: Past Medical, Surgical, Social, and Family History Reviewed & Updated per EMR.   Pertinent Historical Findings include: PMSHx -  afib Medications - xarelto  DATA REVIEWED: 06/11/13: cervical spine x-ray: 1. Degenerative disease is changes within the lower cervical spine 2. Areas of facet arthropathy 3. No evidence of acute osseous abnormalities 4. If there is persistent clinical concern, further evaluation with MRI is recommended  PHYSICAL EXAM:  VS: BP:(!) 108/52  HR:71bpm  TEMP: ( )  RESP:   HT:5\' 4"  (162.6 cm)   WT:175 lb (79.4 kg)  BMI:30.1 PHYSICAL EXAM: Gen: NAD, alert, cooperative with exam,  HEENT: clear conjunctiva, EOMI CV:  no edema, capillary refill brisk,  Resp: non-labored, normal speech Skin: no rashes, normal turgor  Neuro: no gross deficits.  Psych:  alert and oriented Neck:  No significant tenderness to palpation over the trapezius, cervical spine, or cervical spinal muscles. Normal shrug. Limited neck extension. Normal neck flexion. Limitations with lateral rotation and side bending. Negative Spurling's test. Normal strength in upper extremities bilaterally. Normal sensation in upper extremities.  Normal grip strength. Neurovascular intact.  ASSESSMENT & PLAN:   Neck muscle  spasm Likely as a spasm due to underlying arthritic changes within her neck. She denies any injury. - Tramadol #20 R0 provided - Prednisone six-day taper. - Advised to follow-up if having no improvement.

## 2016-10-12 ENCOUNTER — Ambulatory Visit: Payer: Medicare Other

## 2016-10-29 NOTE — Progress Notes (Signed)
Subjective:    Patient ID: [name], female    DOB: 28-Apr-1946, 71 y.o.   MRN: [id number]  HPI The patient is here for follow up.  She walks 7000-10000 steps daily.   Left hip bursitis:  She has chronic, daily pain in her left hip and also has right hip pain.  She has seen sports medicine, dr fields years ago.  She is taking tylenol and tramadol at the same time and it controls her pain.  If she just takes one the pain is not controlled.  She is trying to stay active.    Afib, Hypertension: She follows with cardiology.  She is taking her medication daily. she rarely needs to take the as needed cardizem.  She is compliant with a low sodium diet.  She denies chest pain, palpitations, edema, shortness of breath and regular headaches. She is exercising regularly.     Hyperlipidemia: She is taking her medication daily. She is compliant with a low fat/cholesterol diet. She is exercising regularly.    Medications and allergies reviewed with patient and updated if appropriate.  Patient Active Problem List   Diagnosis Date Noted  . Primary osteoarthritis of both feet 07/10/2016  . Primary osteoarthritis of both hands 07/10/2016  . Alopecia totalis 06/05/2016  . 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 H/O dysplastic nevus 08/03/2015  . Obese 10/14/2014  . Neck muscle spasm 05/22/2014  . Degenerative lumbar disc 12/04/2013  . Hematuria, undiagnosed cause 09/02/2013  . Osteopenia 06/11/2013  . Primary generalized (osteo)arthritis 12/18/2011  . MIXED HYPERLIPIDEMIA 03/25/2009  . Essential hypertension 03/24/2009  . MITRAL VALVE PROLAPSE 03/24/2009  . ATRIAL FIBRILLATION 03/24/2009  . COCCYGEAL PAIN 02/12/2007  . Greater trochanteric bursitis of left hip 02/12/2007    Current Outpatient Prescriptions on File Prior to Visit  Medication Sig Dispense Refill  . bifidobacterium infantis (ALIGN) capsule Take 1 capsule by mouth daily. 30 capsule 11  . cholecalciferol (VITAMIN D) 1000  UNITS tablet Take 2,000 Units by mouth daily.    [name] co-enzyme Q-10 50 MG capsule Take 50 mg by mouth daily.    [name] diltiazem (CARDIZEM CD) 360 MG 24 hr capsule TAKE 1 CAPSULE DAILY 90 capsule 3  . diltiazem (CARDIZEM) 30 MG tablet Take 1 tablet (30 mg total) by mouth as needed (for A Fib). 45 tablet 2  . ferrous sulfate 325 (65 FE) MG EC tablet Take 325 mg by mouth daily.    . hydrochlorothiazide (HYDRODIURIL) 25 MG tablet Take 1 tablet (25 mg total) by mouth daily. Must keep 10/30/16 appt for future refills 30 tablet 0  . lisinopril (PRINIVIL,ZESTRIL) 40 MG tablet Take 1 tablet (40 mg total) by mouth daily. --- Office visit needed for further refills 90 tablet 0  . potassium chloride (KLOR-CON M10) 10 MEQ tablet Take 1 tablet (10 mEq total) by mouth daily. --- Office visit needed for further refills 90 tablet 0  . pravastatin (PRAVACHOL) 20 MG tablet Take 1 tablet (20 mg total) by mouth daily. --- Office visit needed for further refills 90 tablet 0  . predniSONE (DELTASONE) 10 MG tablet Take as directed per doctor's orders 21 tablet 0  . rivaroxaban (XARELTO) 20 MG TABS tablet Take 1 tablet (20 mg total) by mouth daily with supper. 90 tablet 1  . sodium fluoride (DENTA 5000 PLUS) 1.1 % CREA dental cream     . traMADol (ULTRAM) 50 MG tablet Take 1 tablet (50 mg total) by mouth every 8 (eight) hours as needed.  20 tablet 0   No current facility-administered medications on file prior to visit.     Past Medical History:  Diagnosis Date  . ARTHRITIS, HIPS, BILATERAL   . Atrial fibrillation (HCC)    chronic anticoag - Pradaxa  . BURSITIS, SUBTROCHANTERIC   . HYPERTENSION   . MITRAL VALVE PROLAPSE   . Mixed hyperlipidemia     Past Surgical History:  Procedure Laterality Date  . DILATION AND CURETTAGE OF UTERUS    . TONSILLECTOMY      Social History   Social History  . Marital status: Married    Spouse name: N/A  . Number of children: N/A  . Years of education: N/A   Social History Main  Topics  . Smoking status: Former Smoker    Years: 1.00  . Smokeless tobacco: Never Used     Comment: social age 40  . Alcohol use No  . Drug use: No  . Sexual activity: Not Asked   Other Topics Concern  . None   Social History Narrative  . None    Family History  Problem Relation Age of Onset  . Hypertension Mother   . Heart failure Mother   . Leukemia Father     Review of Systems  Constitutional: Negative for chills and fever.  Respiratory: Negative for cough, shortness of breath and wheezing.   Cardiovascular: Negative for chest pain, palpitations and leg swelling.  Musculoskeletal: Positive for arthralgias.  Neurological: Negative for light-headedness and headaches.       Objective:   Vitals:   10/30/16 1428  BP: 124/66  Pulse: 97  Resp: 16  Temp: 97.9 F (36.6 C)   Wt Readings from Last 3 Encounters:  10/30/16 170 lb (77.1 kg)  10/11/16 175 lb (79.4 kg)  07/27/16 175 lb 3.2 oz (79.5 kg)   Body mass index is 29.18 kg/m.   Physical Exam    Constitutional: Appears well-developed and well-nourished. No distress.  HENT:  Head: Normocephalic and atraumatic.  Neck: Neck supple. No tracheal deviation present. No thyromegaly present.  No cervical lymphadenopathy Cardiovascular: Normal rate, regular rhythm and normal heart sounds.   No murmur heard. No carotid bruit .  No edema Pulmonary/Chest: Effort normal and breath sounds normal. No respiratory distress. No has no wheezes. No rales.  Skin: Skin is warm and dry. Not diaphoretic.  Psychiatric: Normal mood and affect. Behavior is normal.      Assessment & Plan:    See Problem List for Assessment and Plan of chronic medical problems.

## 2016-10-30 ENCOUNTER — Ambulatory Visit (INDEPENDENT_AMBULATORY_CARE_PROVIDER_SITE_OTHER): Payer: Medicare Other | Admitting: Internal Medicine

## 2016-10-30 ENCOUNTER — Encounter: Payer: Self-pay | Admitting: Internal Medicine

## 2016-10-30 DIAGNOSIS — I10 Essential (primary) hypertension: Secondary | ICD-10-CM

## 2016-10-30 DIAGNOSIS — I482 Chronic atrial fibrillation, unspecified: Secondary | ICD-10-CM

## 2016-10-30 DIAGNOSIS — L630 Alopecia (capitis) totalis: Secondary | ICD-10-CM | POA: Diagnosis not present

## 2016-10-30 DIAGNOSIS — M7062 Trochanteric bursitis, left hip: Secondary | ICD-10-CM

## 2016-10-30 DIAGNOSIS — E782 Mixed hyperlipidemia: Secondary | ICD-10-CM | POA: Diagnosis not present

## 2016-10-30 DIAGNOSIS — M150 Primary generalized (osteo)arthritis: Secondary | ICD-10-CM

## 2016-10-30 MED ORDER — TRAMADOL HCL 50 MG PO TABS: 50.0000 mg | ORAL_TABLET | Freq: Two times a day (BID) | ORAL | 5 refills | Status: DC | PRN

## 2016-10-30 MED ORDER — LISINOPRIL 40 MG PO TABS: 40.0000 mg | ORAL_TABLET | Freq: Every day | ORAL | 1 refills | Status: DC

## 2016-10-30 MED ORDER — PRAVASTATIN SODIUM 20 MG PO TABS: 20.0000 mg | ORAL_TABLET | Freq: Every day | ORAL | 1 refills | Status: DC

## 2016-10-30 MED ORDER — POTASSIUM CHLORIDE CRYS ER 10 MEQ PO TBCR: 10.0000 meq | EXTENDED_RELEASE_TABLET | Freq: Every day | ORAL | 1 refills | Status: DC

## 2016-10-30 MED ORDER — HYDROCHLOROTHIAZIDE 25 MG PO TABS: 25.0000 mg | ORAL_TABLET | Freq: Every day | ORAL | 1 refills | Status: DC

## 2016-10-30 NOTE — Assessment & Plan Note (Addendum)
Controlled with medications, takes cardizem as needed on average twice a month On xarelto Follows with cardiology

## 2016-10-30 NOTE — Assessment & Plan Note (Signed)
Continue pravastatin 

## 2016-10-30 NOTE — Progress Notes (Signed)
Pre visit review using our clinic review tool, if applicable. No additional management support is needed unless otherwise documented below in the visit note. 

## 2016-10-30 NOTE — Assessment & Plan Note (Signed)
Chronic, daily pain Taking tylenol arthritis 1-2 times a day plus tramadol 1-2 times a day - only work if both meds taken together Has seen sports med - Dr Oneida Alar in the past Has taken prednisone in the past

## 2016-10-30 NOTE — Patient Instructions (Signed)
  All other Health Maintenance issues reviewed.   All recommended immunizations and age-appropriate screenings are up-to-date or discussed.  No immunizations administered today.   Medications reviewed and updated.  No changes recommended at this time.  Your prescription(s) have been submitted to your pharmacy. Please take as directed and contact our office if you believe you are having problem(s) with the medication(s).   Please followup in 6 months for pain management/ wellness

## 2016-10-30 NOTE — Assessment & Plan Note (Signed)
BP well controlled Current regimen effective and well tolerated Continue current medications at current doses  

## 2016-10-30 NOTE — Assessment & Plan Note (Addendum)
Has seen derm and rheum - Dr Estanislado Pandy

## 2016-10-30 NOTE — Assessment & Plan Note (Signed)
Taking tylenol and tramadol - 1-2 times a day Continue regular exercise

## 2016-11-01 DIAGNOSIS — M542 Cervicalgia: Secondary | ICD-10-CM | POA: Diagnosis not present

## 2016-11-01 DIAGNOSIS — M5134 Other intervertebral disc degeneration, thoracic region: Secondary | ICD-10-CM | POA: Diagnosis not present

## 2016-11-06 ENCOUNTER — Other Ambulatory Visit: Payer: Self-pay | Admitting: Internal Medicine

## 2016-11-07 ENCOUNTER — Ambulatory Visit
Admission: RE | Admit: 2016-11-07 | Discharge: 2016-11-07 | Disposition: A | Discharge status: Home / Self Care | Payer: Medicare Other | Source: Ambulatory Visit | Attending: Obstetrics & Gynecology | Admitting: Obstetrics & Gynecology

## 2016-11-07 DIAGNOSIS — Z1231 Encounter for screening mammogram for malignant neoplasm of breast: Secondary | ICD-10-CM | POA: Diagnosis not present

## 2016-11-15 ENCOUNTER — Ambulatory Visit: Payer: Medicare Other | Attending: Sports Medicine

## 2016-11-15 DIAGNOSIS — M542 Cervicalgia: Secondary | ICD-10-CM | POA: Diagnosis not present

## 2016-11-15 DIAGNOSIS — R293 Abnormal posture: Secondary | ICD-10-CM | POA: Insufficient documentation

## 2016-11-15 DIAGNOSIS — R252 Cramp and spasm: Secondary | ICD-10-CM | POA: Diagnosis not present

## 2016-11-15 NOTE — Therapy (Addendum)
[facility] [address] [place], [place], [address] Phone: [phone]   Fax:  [phone]  Physical Therapy Treatment  Patient Details  Name: TRESSA MALDONADO MRN: [id number] Date of Birth: [birth year] Referring Provider: Wandra Feinstein, MD  Encounter Date: 11/15/2016      PT End of Session - 11/15/16 1621    Visit Number 1   Number of Visits 10   Date for PT Re-Evaluation 01/10/17   PT Start Time 2423   PT Stop Time 1622   PT Time Calculation (min) 52 min   Activity Tolerance Patient tolerated treatment well   Behavior During Therapy [facility] for tasks assessed/performed      Past Medical History:  Diagnosis Date  . ARTHRITIS, HIPS, BILATERAL   . Atrial fibrillation (HCC)    chronic anticoag - Pradaxa  . BURSITIS, SUBTROCHANTERIC   . HYPERTENSION   . MITRAL VALVE PROLAPSE   . Mixed hyperlipidemia     Past Surgical History:  Procedure Laterality Date  . DILATION AND CURETTAGE OF UTERUS    . TONSILLECTOMY      There were no vitals filed for this visit.      Subjective Assessment - 11/15/16 1534    Subjective Pt presents to PT with complaints of Lt sided neck pain that began ~6 weeks ago without incident or injury.  Pt reports that she woke up with pain and was not able to turn head or look up or down.  Pt took an oral dose of Prednisone and symptoms has improved.     Diagnostic tests DDD cervical spine   Patient Stated Goals reduce neck pain, reduce strain and tension, improve cervical A/ROM   Currently in Pain? Yes   Pain Score 3    Pain Location Neck   Pain Orientation Left;Right   Pain Descriptors / Indicators Aching;Tightness   Pain Type Chronic pain   Pain Onset More than a month ago   Pain Frequency Intermittent   Aggravating Factors  turning neck with driving, exercise at the gym   Pain Relieving Factors Tylenol, Tramadol, not turning neck   Effect of Pain on Daily Activities difficulty  turning head with driving            [facility] PT Assessment - 11/15/16 0001      Assessment   Medical Diagnosis cervical spondylosis/DDD/muscle spasm   Referring Provider Wandra Feinstein, MD   Onset Date/Surgical Date 09/07/16   Next MD Visit none   Prior Therapy none     Precautions   Precautions None     Restrictions   Weight Bearing Restrictions No     Balance Screen   Has the patient fallen in the past 6 months No   Has the patient had a decrease in activity level because of a fear of falling?  No   Is the patient reluctant to leave their home because of a fear of falling?  No     Home Ecologist residence     Prior Function   Level of Independence Independent   Vocation Retired   Leisure exercise at [employer] (classes), travel     Cognition   Overall Cognitive Status Within Functional Limits for tasks assessed     Observation/Other Assessments   Focus on Therapeutic Outcomes (FOTO)  47% limitation     Posture/Postural Control   Posture/Postural Control Postural limitations   Postural Limitations Rounded Shoulders;Forward head     ROM /  Strength   AROM / PROM / Strength AROM;PROM;Strength     AROM   Overall AROM  Deficits   Overall AROM Comments UE A/ROM is full with stiffness reported at end range   AROM Assessment Site Cervical   Cervical Flexion 40   Cervical Extension 8   Cervical - Right Side Bend 10   Cervical - Left Side Bend 20   Cervical - Right Rotation 35   Cervical - Left Rotation 38     Strength   Overall Strength Deficits   Overall Strength Comments 4+/5 bil UE strength     Palpation   Spinal mobility reduced mobility C3-T8 with mobs.     Palpation comment active trigger points in bil upper traps, rhomboids and bil cervical paraspinals     Ambulation/Gait   Ambulation/Gait Yes   Ambulation/Gait Assistance 7: Independent                             PT Education - 11/15/16 1627     Education provided Yes   Education Details posture, scapular squeezes, cervical A/ROM   Person(s) Educated Patient   Methods Explanation;Demonstration;Handout   Comprehension Verbalized understanding;Returned demonstration          PT Short Term Goals - 11/15/16 1527      PT SHORT TERM GOAL #1   Title be independent in initial HEP   Time 4   Period Weeks   Status New     PT SHORT TERM GOAL #2   Title demonstrate neutral seated posture and perform postural corrects throughout the day   Time 8   Period Weeks   Status New     PT SHORT TERM GOAL #3   Title demonstrate cervical A/ROM to > or = to 45 degrees to improve safety with driving   Time 4   Period Weeks   Status New     PT SHORT TERM GOAL #4   Title report a 25% reduction in neck pain with turning head   Time 4   Period Weeks   Status New           PT Long Term Goals - 11/15/16 1527      PT LONG TERM GOAL #1   Title be independent in advanced HEP   Time 8   Period Weeks   Status New     PT LONG TERM GOAL #2   Title reduce FOTO to < or = to 34% limitation   Time 8   Period Weeks   Status New     PT LONG TERM GOAL #3   Title demonstrate cervical A/ROM rotation to > or = to 55 degrees to improve safety with driving   Time 8   Period Weeks   Status New     PT LONG TERM GOAL #4   Title report a 50% reduction in neck pain with turning neck   Time 8   Period Weeks   Status New     PT LONG TERM GOAL #5   Title demonstrate cervical sidebending to > or = to 40 degrees to improve mobility   Time 8   Period Weeks   Status New               Plan - 11/15/16 1622    Clinical Impression Statement Pt presents to PT with complaints of neck pain that began suddenly 6 weeks ago without incident or injury.  Recent x-ray showed cervical DDD.  Pt demonstrates poor seated posture, limited cervical A/ROM, reduced thoracic and cervical mobility and trigger points/tension in bil neck and thoracic  musculature.  Pt is a low complexity evaluation due to lack of comorbidities.  Pt will benefit from skilled PT for spinal mobs, dry needling/manual, cervical and thoracic mobility, postural education and strengthening.     Rehab Potential Good   PT Frequency 2x / week   PT Duration 8 weeks   PT Treatment/Interventions ADLs/Self Care Home Management;Cryotherapy;Electrical Stimulation;Functional mobility training;Ultrasound;Moist Heat;Traction;Therapeutic activities;Therapeutic exercise;Neuromuscular re-education;Patient/family education;Passive range of motion;Manual techniques;Dry needling;Taping   PT Next Visit Plan Pt will be in [place] for 10 days.  Dry needling to neck/thoracic, manual, spinal mobs, cervical flexibility and postural strength   Consulted and Agree with Plan of Care Patient      Patient will benefit from skilled therapeutic intervention in order to improve the following deficits and impairments:  Postural dysfunction, Decreased strength, Improper body mechanics, Decreased activity tolerance, Increased muscle spasms, Decreased range of motion, Hypomobility  Visit Diagnosis: Cervicalgia - Plan: PT plan of care cert/re-cert  Cramp and spasm - Plan: PT plan of care cert/re-cert  Abnormal posture - Plan: PT plan of care cert/re-cert       G-Codes - 11/17/2016 1529    Functional Assessment Tool Used (Outpatient Only) FOTO: 47% limitation   Functional Limitation Changing and maintaining body position   Changing and Maintaining Body Position Current Status (C3762) At least 40 percent but less than 60 percent impaired, limited or restricted   Changing and Maintaining Body Position Goal Status (G3151) At least 20 percent but less than 40 percent impaired, limited or restricted      Problem List Patient Active Problem List   Diagnosis Date Noted  . Primary osteoarthritis of both feet 07/10/2016  . Primary osteoarthritis of both hands 07/10/2016  . Alopecia totalis 06/05/2016   . Dermatochalasis of both upper eyelids 10/12/2015  . H/O dysplastic nevus 08/03/2015  . Obese 10/14/2014  . Neck muscle spasm 05/22/2014  . Degenerative lumbar disc 12/04/2013  . Hematuria, undiagnosed cause 09/02/2013  . Osteopenia 06/11/2013  . Primary generalized (osteo)arthritis 12/18/2011  . Mixed hyperlipidemia 03/25/2009  . Essential hypertension 03/24/2009  . MITRAL VALVE PROLAPSE 03/24/2009  . ATRIAL FIBRILLATION 03/24/2009  . COCCYGEAL PAIN 02/12/2007  . Greater trochanteric bursitis of left hip 02/12/2007     Sigurd Sos, PT 11/17/16 4:36 PM PHYSICAL THERAPY DISCHARGE SUMMARY G-codes: Changing position CJ all 3 categories Visits from Start of Care: 1  Current functional level related to goals / functional outcomes: Pt didn't return to PT after evaluation.   Remaining deficits: See above for current status.    Education / Equipment: HEP Plan: Patient agrees to discharge.  Patient goals were not met. Patient is being discharged due to not returning since the last visit.  ?????  Sigurd Sos, PT 01/11/17 10:31 AM       [facility] [address], [place], [place], [address] Phone: [phone]   Fax:  [phone]  Name: KAYTIE RATCLIFFE MRN: [id number] Date of Birth: February 19, 1946

## 2016-11-15 NOTE — Patient Instructions (Addendum)
PERFORM ALL EXERCISES GENTLY AND WITH GOOD POSTURE.    20 SECOND HOLD, 3 REPS TO EACH SIDE. 4-5 TIMES EACH DAY.   AROM: Neck Rotation   Turn head slowly to look over one shoulder, then the other.   AROM: Neck Flexion   Bend head forward.   AROM: Lateral Neck Flexion   Slowly tilt head toward one shoulder, then the other.  Scapular Retraction (Standing)    With arms at sides, pinch shoulder blades together.  Hold 5 seconds. Repeat _10___ times per set. Do __5__ sets per session. Do _many___ sessions per day.  http://orth.exer.us/945   Copyright  VHI. All rights reserved.   Posture - Standing   Good posture is important. Avoid slouching and forward head thrust. Maintain curve in low back and align ears over shoulders, hips over ankles.  Pull your belly button in toward your back bone. Posture Tips DO: - stand tall and erect - keep chin tucked in - keep head and shoulders in alignment - check posture regularly in mirror or large window - pull head back against headrest in car seat;  Change your position often.  Sit with lumbar support. DON'T: - slouch or slump while watching TV or reading - sit, stand or lie in one position  for too long;  Sitting is especially hard on the spine so if you sit at a desk/use the computer, then stand up often! Copyright  VHI. All rights reserved.  Posture - Sitting  Sit upright, head facing forward. Try using a roll to support lower back. Keep shoulders relaxed, and avoid rounded back. Keep hips level with knees. Avoid crossing legs for long periods. Copyright  VHI. All rights reserved.  Chronic neck strain can develop because of poor posture and faulty work habits  Postural strain related to slumped sitting and forward head posture is a leading cause of headaches, neck and upper back pain  General strengthening and flexibility exercises are helpful in the treatment of neck pain.  Most importantly, you should learn to correct the posture  that may be contributing to chronic pain.   Change positions frequently  Change your work or home environment to improve posture and mechanics.   [facility] [address] [place], [place] [address] Phone # [phone] Fax [phone]

## 2016-11-22 ENCOUNTER — Other Ambulatory Visit: Payer: Self-pay | Admitting: Internal Medicine

## 2016-11-28 ENCOUNTER — Other Ambulatory Visit (HOSPITAL_COMMUNITY): Payer: Self-pay | Admitting: Nurse Practitioner

## 2016-11-29 ENCOUNTER — Other Ambulatory Visit (HOSPITAL_COMMUNITY): Payer: Self-pay | Admitting: *Deleted

## 2016-11-29 MED ORDER — DILTIAZEM HCL ER COATED BEADS 360 MG PO CP24: 360.0000 mg | ORAL_CAPSULE | Freq: Every day | ORAL | 3 refills | Status: DC

## 2016-12-05 DIAGNOSIS — H52203 Unspecified astigmatism, bilateral: Secondary | ICD-10-CM | POA: Diagnosis not present

## 2016-12-05 DIAGNOSIS — H524 Presbyopia: Secondary | ICD-10-CM | POA: Diagnosis not present

## 2016-12-05 DIAGNOSIS — Z961 Presence of intraocular lens: Secondary | ICD-10-CM | POA: Diagnosis not present

## 2017-01-09 ENCOUNTER — Ambulatory Visit (INDEPENDENT_AMBULATORY_CARE_PROVIDER_SITE_OTHER): Payer: Medicare Other | Admitting: Internal Medicine

## 2017-01-09 VITALS — BP 118/82 | HR 80 | Ht 64.0 in | Wt 163.0 lb

## 2017-01-09 DIAGNOSIS — M79671 Pain in right foot: Secondary | ICD-10-CM | POA: Insufficient documentation

## 2017-01-09 DIAGNOSIS — L98499 Non-pressure chronic ulcer of skin of other sites with unspecified severity: Secondary | ICD-10-CM | POA: Diagnosis not present

## 2017-01-09 DIAGNOSIS — I10 Essential (primary) hypertension: Secondary | ICD-10-CM | POA: Diagnosis not present

## 2017-01-09 NOTE — Patient Instructions (Signed)
Please continue all other medications as before, and refills have been done if requested.  Please have the pharmacy call with any other refills you may need.  Please keep your appointments with your specialists as you may have planned  You will be contacted regarding the referral for: Podiatry

## 2017-01-11 ENCOUNTER — Encounter: Payer: Self-pay | Admitting: Podiatry

## 2017-01-11 ENCOUNTER — Ambulatory Visit (INDEPENDENT_AMBULATORY_CARE_PROVIDER_SITE_OTHER): Payer: Medicare Other | Admitting: Podiatry

## 2017-01-11 DIAGNOSIS — B079 Viral wart, unspecified: Secondary | ICD-10-CM | POA: Diagnosis not present

## 2017-01-11 DIAGNOSIS — M7989 Other specified soft tissue disorders: Secondary | ICD-10-CM | POA: Diagnosis not present

## 2017-01-11 DIAGNOSIS — L089 Local infection of the skin and subcutaneous tissue, unspecified: Secondary | ICD-10-CM | POA: Diagnosis not present

## 2017-01-11 DIAGNOSIS — L859 Epidermal thickening, unspecified: Secondary | ICD-10-CM | POA: Diagnosis not present

## 2017-01-11 NOTE — Addendum Note (Signed)
Addended by: Marylou Mccoy on: 01/11/2017 05:04 PM   Modules accepted: Orders

## 2017-01-11 NOTE — Patient Instructions (Addendum)

## 2017-01-11 NOTE — Progress Notes (Signed)
Subjective:    Patient ID: [name], female   DOB: 71 y.o.   MRN: [id number]   HPI patient presents stating that she has a painful lesion on the bottom of her right heel and she thought that she had a skin tag that she trams and it's become irritated for the last couple months. Stating she's getting ready to go away for 2 months    Review of Systems  All other systems reviewed and are negative.       Objective:  Physical Exam  Cardiovascular: Intact distal pulses.   Musculoskeletal: Normal range of motion.  Neurological: She is alert.  Skin: Skin is warm.  Nursing note and vitals reviewed.  neurovascular status intact negative Homan sign was noted with patient found to have an inflamed lesion plantar aspect right posterior heel measuring approximate 7 mm x 7 mm that's painful to lateral pressure and bleeds upon debridement     Assessment:  Possibility for verruca plantaris right versus other unknown pathological lesion       Plan:    H&P condition reviewed with patient. I've recommended removal of the lesion and explained procedure to do this and surgery and risk. Patient wants surgery and today I infiltrated 60 Milligan Xylocaine with epinephrine did sterile prep of the right heel and then utilizing sterile his mentation excised the mass completely and applied phenol to the base with sterile dressing. Placed in formalin and sent for pathological evaluation and reappoint to recheck

## 2017-01-11 NOTE — Progress Notes (Signed)
   Subjective:    Patient ID: [name], female    DOB: 12-Jun-1946, 71 y.o.   MRN: [id number]  HPI Chief Complaint  Patient presents with  . Callouses    Rt foot bottom of the heel have hard skin/painful for about 2 weeks      Review of Systems  Musculoskeletal: Positive for joint swelling and myalgias.  Hematological: Bruises/bleeds easily.       Objective:   Physical Exam        Assessment & Plan:

## 2017-01-13 NOTE — Assessment & Plan Note (Signed)
For pain control,  to f/u any worsening symptoms or concerns 

## 2017-01-13 NOTE — Progress Notes (Signed)
Subjective:    Patient ID: [name], female    DOB: 1946/07/28, 71 y.o.   MRN: [id number]  HPI  Here to f/u with c/o 1 wks increased right medial post heel tender with hardness to it building up for several months; No fever, drainage, redness or ulcer. No hx of DM or PVD,  Pt denies chest pain, increased sob or doe, wheezing, orthopnea, PND, increased LE swelling, palpitations, dizziness or syncope. Past Medical History:  Diagnosis Date  . ARTHRITIS, HIPS, BILATERAL   . Atrial fibrillation (HCC)    chronic anticoag - Pradaxa  . BURSITIS, SUBTROCHANTERIC   . HYPERTENSION   . MITRAL VALVE PROLAPSE   . Mixed hyperlipidemia    Past Surgical History:  Procedure Laterality Date  . DILATION AND CURETTAGE OF UTERUS    . TONSILLECTOMY      reports that she has quit smoking. She quit after 1.00 year of use. She has never used smokeless tobacco. She reports that she does not drink alcohol or use drugs. family history includes Heart failure in her mother; Hypertension in her mother; Leukemia in her father. Allergies  Allergen Reactions  . Nickel Rash   Current Outpatient Prescriptions on File Prior to Visit  Medication Sig Dispense Refill  . bifidobacterium infantis (ALIGN) capsule Take 1 capsule by mouth daily. 30 capsule 11  . cholecalciferol (VITAMIN D) 1000 UNITS tablet Take 2,000 Units by mouth daily.    [name] co-enzyme Q-10 50 MG capsule Take 50 mg by mouth daily.    [name] diltiazem (CARDIZEM CD) 360 MG 24 hr capsule Take 1 capsule (360 mg total) by mouth daily. 90 capsule 3  . diltiazem (CARDIZEM) 30 MG tablet Take 1 tablet every 4 hours AS NEEDED for AFIB heart rate >100 45 tablet 1  . ferrous sulfate 325 (65 FE) MG EC tablet Take 325 mg by mouth daily.    . hydrochlorothiazide (HYDRODIURIL) 25 MG tablet Take 1 tablet (25 mg total) by mouth daily. 90 tablet 1  . lisinopril (PRINIVIL,ZESTRIL) 40 MG tablet Take 1 tablet (40 mg total) by mouth daily. 90 tablet 1  . potassium chloride  (KLOR-CON M10) 10 MEQ tablet Take 1 tablet (10 mEq total) by mouth daily. 90 tablet 1  . pravastatin (PRAVACHOL) 20 MG tablet Take 1 tablet (20 mg total) by mouth daily. 90 tablet 1  . sodium fluoride (DENTA 5000 PLUS) 1.1 % CREA dental cream     . traMADol (ULTRAM) 50 MG tablet Take 1 tablet (50 mg total) by mouth every 12 (twelve) hours as needed. 60 tablet 5  . XARELTO 20 MG TABS tablet TAKE 1 TABLET BY MOUTH EVERY DAY WITH SUPPER 30 tablet 6   No current facility-administered medications on file prior to visit.    Review of Systems All otherwise neg per pt    Objective:   Physical Exam BP 118/82   Pulse 80   Ht 5\' 4"  (1.626 m)   Wt 163 lb (73.9 kg)   SpO2 (!) 87%   BMI 27.98 kg/m  VS noted,  Constitutional: Pt appears in NAD HENT: Head: NCAT.  Right Ear: External ear normal.  Left Ear: External ear normal.  Eyes: . Pupils are equal, round, and reactive to light. Conjunctivae and EOM are normal Nose: without d/c or deformity Neck: Neck supple. Gross normal ROM Cardiovascular: Normal rate and regular rhythm.   Pulmonary/Chest: Effort normal and breath sounds without rales or wheezing.  Neurological: Pt is alert. At baseline orientation,  motor grossly intact Skin: Skin is warm. No rashes, no LE edema, right post heel with postmed aspect with 1.5 cm area tender callous, without erythema, swelling or ulceration Psychiatric: Pt behavior is normal without agitation  No other exam findings    Assessment & Plan:

## 2017-01-13 NOTE — Assessment & Plan Note (Signed)
stable overall by history and exam, recent data reviewed with pt, and pt to continue medical treatment as before,  to f/u any worsening symptoms or concerns BP Readings from Last 3 Encounters:  01/09/17 118/82  10/30/16 124/66  10/11/16 (!) 108/52

## 2017-01-13 NOTE — Assessment & Plan Note (Signed)
For podiatry referral,  to f/u any worsening symptoms or concerns  

## 2017-01-17 ENCOUNTER — Ambulatory Visit: Payer: BLUE CROSS/BLUE SHIELD | Admitting: Podiatry

## 2017-04-04 ENCOUNTER — Telehealth: Payer: Self-pay | Admitting: Internal Medicine

## 2017-04-04 ENCOUNTER — Other Ambulatory Visit: Payer: Self-pay | Admitting: Internal Medicine

## 2017-04-04 MED ORDER — HYDROCHLOROTHIAZIDE 25 MG PO TABS: 25.0000 mg | ORAL_TABLET | Freq: Every day | ORAL | 1 refills | Status: DC

## 2017-04-04 NOTE — Telephone Encounter (Signed)
Reviewed chart pt is up-to-date sent refills to [facility]  

## 2017-04-04 NOTE — Telephone Encounter (Signed)
Pt needs refill hydrochlorothiazide (HYDRODIURIL) 25 MG tablet [[id number]]   She made an appt   Pharmacy file

## 2017-05-10 DIAGNOSIS — N390 Urinary tract infection, site not specified: Secondary | ICD-10-CM | POA: Diagnosis not present

## 2017-05-10 DIAGNOSIS — Z01419 Encounter for gynecological examination (general) (routine) without abnormal findings: Secondary | ICD-10-CM | POA: Diagnosis not present

## 2017-05-10 DIAGNOSIS — Z6828 Body mass index (BMI) 28.0-28.9, adult: Secondary | ICD-10-CM | POA: Diagnosis not present

## 2017-05-10 DIAGNOSIS — Z124 Encounter for screening for malignant neoplasm of cervix: Secondary | ICD-10-CM | POA: Diagnosis not present

## 2017-05-15 ENCOUNTER — Ambulatory Visit (INDEPENDENT_AMBULATORY_CARE_PROVIDER_SITE_OTHER): Payer: Medicare Other | Admitting: Internal Medicine

## 2017-05-15 ENCOUNTER — Encounter: Payer: Self-pay | Admitting: Internal Medicine

## 2017-05-15 VITALS — BP 120/62 | HR 79 | Temp 98.6°F | Resp 16 | Wt 170.0 lb

## 2017-05-15 DIAGNOSIS — Z13820 Encounter for screening for osteoporosis: Secondary | ICD-10-CM

## 2017-05-15 DIAGNOSIS — M8580 Other specified disorders of bone density and structure, unspecified site: Secondary | ICD-10-CM | POA: Diagnosis not present

## 2017-05-15 DIAGNOSIS — I482 Chronic atrial fibrillation, unspecified: Secondary | ICD-10-CM

## 2017-05-15 DIAGNOSIS — I10 Essential (primary) hypertension: Secondary | ICD-10-CM

## 2017-05-15 DIAGNOSIS — R739 Hyperglycemia, unspecified: Secondary | ICD-10-CM | POA: Diagnosis not present

## 2017-05-15 DIAGNOSIS — Z23 Encounter for immunization: Secondary | ICD-10-CM | POA: Diagnosis not present

## 2017-05-15 DIAGNOSIS — M150 Primary generalized (osteo)arthritis: Secondary | ICD-10-CM

## 2017-05-15 DIAGNOSIS — E2839 Other primary ovarian failure: Secondary | ICD-10-CM | POA: Diagnosis not present

## 2017-05-15 DIAGNOSIS — R7303 Prediabetes: Secondary | ICD-10-CM | POA: Insufficient documentation

## 2017-05-15 DIAGNOSIS — E782 Mixed hyperlipidemia: Secondary | ICD-10-CM | POA: Diagnosis not present

## 2017-05-15 MED ORDER — LISINOPRIL 40 MG PO TABS
40.0000 mg | ORAL_TABLET | Freq: Every day | ORAL | 1 refills | Status: DC
Start: 2017-05-15 — End: 2017-07-10

## 2017-05-15 MED ORDER — POTASSIUM CHLORIDE CRYS ER 10 MEQ PO TBCR: 10.0000 meq | EXTENDED_RELEASE_TABLET | Freq: Every day | ORAL | 1 refills | Status: DC

## 2017-05-15 MED ORDER — HYDROCHLOROTHIAZIDE 25 MG PO TABS
25.0000 mg | ORAL_TABLET | Freq: Every day | ORAL | 1 refills | Status: DC
Start: 2017-05-15 — End: 2017-07-10

## 2017-05-15 NOTE — Assessment & Plan Note (Signed)
Doing water aerobics, not able to walk much Taking calcium and vitamin d dexa due - ordered

## 2017-05-15 NOTE — Assessment & Plan Note (Signed)
Check a1c 

## 2017-05-15 NOTE — Progress Notes (Signed)
Subjective:    Patient ID: [name], female    DOB: Sep 05, 1945, 71 y.o.   MRN: [id number]  HPI The patient is here for follow up.  Afib, Hypertension: She is taking her medication daily. She is compliant with a low sodium diet.  She denies chest pain, palpitations, frequent edema, shortness of breath and regular headaches. She is exercising regularly - aerobics 3 / week.  She does not monitor her blood pressure at home.    Hyperlipidemia: She is taking her medication daily. She is compliant with a low fat/cholesterol diet. She is exercising regularly. She denies myalgias.   Osteoarthritis:  She taking tylenol and tramadol twice a day.  This is helping her pain.  She has an appointment to see ortho for a possible hip injection - her hip pain has been worse recently from bursitis.    Osteopenia:  She is taking vitamin d.  She does aerobics.  She is due for a dexa scan.   Medications and allergies reviewed with patient and updated if appropriate.  Patient Active Problem List   Diagnosis Date Noted  . Pain of right heel 01/09/2017  . Callous ulcer ([place]) 01/09/2017  . Primary osteoarthritis of both feet 07/10/2016  . Primary osteoarthritis of both hands 07/10/2016  . Alopecia totalis 06/05/2016  . Dermatochalasis of both upper eyelids 10/12/2015  . H/O dysplastic nevus 08/03/2015  . Obese 10/14/2014  . Neck muscle spasm 05/22/2014  . Degenerative lumbar disc 12/04/2013  . Hematuria, undiagnosed cause 09/02/2013  . Osteopenia 06/11/2013  . Primary generalized (osteo)arthritis 12/18/2011  . Mixed hyperlipidemia 03/25/2009  . Essential hypertension 03/24/2009  . MITRAL VALVE PROLAPSE 03/24/2009  . ATRIAL FIBRILLATION 03/24/2009  . COCCYGEAL PAIN 02/12/2007  . Greater trochanteric bursitis of left hip 02/12/2007    Current Outpatient Prescriptions on File Prior to Visit  Medication Sig Dispense Refill  . bifidobacterium infantis (ALIGN) capsule Take 1 capsule by mouth  daily. 30 capsule 11  . cholecalciferol (VITAMIN D) 1000 UNITS tablet Take 2,000 Units by mouth daily.    [name] co-enzyme Q-10 50 MG capsule Take 50 mg by mouth daily.    [name] diltiazem (CARDIZEM CD) 360 MG 24 hr capsule Take 1 capsule (360 mg total) by mouth daily. 90 capsule 3  . diltiazem (CARDIZEM) 30 MG tablet Take 1 tablet every 4 hours AS NEEDED for AFIB heart rate >100 45 tablet 1  . ferrous sulfate 325 (65 FE) MG EC tablet Take 325 mg by mouth daily.    . pravastatin (PRAVACHOL) 20 MG tablet Take 1 tablet (20 mg total) by mouth daily. 90 tablet 1  . sodium fluoride (DENTA 5000 PLUS) 1.1 % CREA dental cream     . traMADol (ULTRAM) 50 MG tablet Take 1 tablet (50 mg total) by mouth every 12 (twelve) hours as needed. 60 tablet 5  . XARELTO 20 MG TABS tablet TAKE 1 TABLET BY MOUTH EVERY DAY WITH SUPPER 30 tablet 6   No current facility-administered medications on file prior to visit.     Past Medical History:  Diagnosis Date  . ARTHRITIS, HIPS, BILATERAL   . Atrial fibrillation (HCC)    chronic anticoag - Pradaxa  . BURSITIS, SUBTROCHANTERIC   . HYPERTENSION   . MITRAL VALVE PROLAPSE   . Mixed hyperlipidemia     Past Surgical History:  Procedure Laterality Date  . DILATION AND CURETTAGE OF UTERUS    . TONSILLECTOMY      Social History  Social History  . Marital status: Married    Spouse name: N/A  . Number of children: N/A  . Years of education: N/A   Social History Main Topics  . Smoking status: Former Smoker    Years: 1.00  . Smokeless tobacco: Never Used     Comment: social age 82  . Alcohol use No  . Drug use: No  . Sexual activity: Not on file   Other Topics Concern  . Not on file   Social History Narrative  . No narrative on file    Family History  Problem Relation Age of Onset  . Hypertension Mother   . Heart failure Mother   . Leukemia Father     Review of Systems  Constitutional: Negative for chills and fever.  Respiratory: Negative for cough,  shortness of breath and wheezing.   Cardiovascular: Positive for leg swelling (rare). Negative for chest pain and palpitations.  Neurological: Negative for light-headedness and headaches.       Objective:   Vitals:   05/15/17 1529  BP: 120/62  Pulse: 79  Resp: 16  Temp: 98.6 F (37 C)  SpO2: 98%   Wt Readings from Last 3 Encounters:  05/15/17 170 lb (77.1 kg)  01/09/17 163 lb (73.9 kg)  10/30/16 170 lb (77.1 kg)   Body mass index is 29.18 kg/m.   Physical Exam    Constitutional: Appears well-developed and well-nourished. No distress.  HENT:  Head: Normocephalic and atraumatic.  Neck: Neck supple. No tracheal deviation present. No thyromegaly present.  No cervical lymphadenopathy Cardiovascular: Normal rate, regular rhythm and normal heart sounds.   No murmur heard. No carotid bruit .  No edema Pulmonary/Chest: Effort normal and breath sounds normal. No respiratory distress. No has no wheezes. No rales.  Skin: Skin is warm and dry. Not diaphoretic.  Psychiatric: Normal mood and affect. Behavior is normal.      Assessment & Plan:    See Problem List for Assessment and Plan of chronic medical problems.

## 2017-05-15 NOTE — Patient Instructions (Addendum)
  Test(s) ordered today. Your results will be released to [place] (or called to you) after review, usually within 72hours after test completion. If any changes need to be made, you will be notified at that same time.  All other Health Maintenance issues reviewed.   All recommended immunizations and age-appropriate screenings are up-to-date or discussed.  Flu immunization administered today.   Medications reviewed and updated.  No changes recommended at this time.  A bone density scan was ordered.   Please followup in 6 months

## 2017-05-15 NOTE — Assessment & Plan Note (Signed)
BP well controlled Current regimen effective and well tolerated Continue current medications at current doses cmp  

## 2017-05-15 NOTE — Assessment & Plan Note (Addendum)
B/l hip and feet pain B/l hip bursitis - increased pain recently - has done PT - did not help - has appt with ortho for possible injection Takes tramadol plus tylenol 2/ day  Pain overall controlled  Continue above

## 2017-05-15 NOTE — Assessment & Plan Note (Signed)
Check lipid panel  Continue daily statin Regular exercise and healthy diet encouraged  

## 2017-05-15 NOTE — Assessment & Plan Note (Signed)
Rate controlled Asymptomatic Taking xarelto Check cbc, cmp

## 2017-05-18 DIAGNOSIS — M7062 Trochanteric bursitis, left hip: Secondary | ICD-10-CM | POA: Diagnosis not present

## 2017-05-18 DIAGNOSIS — M7061 Trochanteric bursitis, right hip: Secondary | ICD-10-CM | POA: Diagnosis not present

## 2017-05-22 DIAGNOSIS — D485 Neoplasm of uncertain behavior of skin: Secondary | ICD-10-CM | POA: Diagnosis not present

## 2017-05-22 DIAGNOSIS — D2371 Other benign neoplasm of skin of right lower limb, including hip: Secondary | ICD-10-CM | POA: Diagnosis not present

## 2017-05-22 DIAGNOSIS — Z23 Encounter for immunization: Secondary | ICD-10-CM | POA: Diagnosis not present

## 2017-05-22 DIAGNOSIS — D225 Melanocytic nevi of trunk: Secondary | ICD-10-CM | POA: Diagnosis not present

## 2017-05-22 DIAGNOSIS — D2271 Melanocytic nevi of right lower limb, including hip: Secondary | ICD-10-CM | POA: Diagnosis not present

## 2017-05-22 DIAGNOSIS — Z86018 Personal history of other benign neoplasm: Secondary | ICD-10-CM | POA: Diagnosis not present

## 2017-05-22 DIAGNOSIS — L821 Other seborrheic keratosis: Secondary | ICD-10-CM | POA: Diagnosis not present

## 2017-05-24 ENCOUNTER — Ambulatory Visit (INDEPENDENT_AMBULATORY_CARE_PROVIDER_SITE_OTHER)
Admission: RE | Admit: 2017-05-24 | Discharge: 2017-05-24 | Disposition: A | Discharge status: Home / Self Care | Payer: Medicare Other | Source: Ambulatory Visit | Attending: Internal Medicine | Admitting: Internal Medicine

## 2017-05-24 ENCOUNTER — Other Ambulatory Visit (INDEPENDENT_AMBULATORY_CARE_PROVIDER_SITE_OTHER): Payer: Medicare Other

## 2017-05-24 DIAGNOSIS — M8580 Other specified disorders of bone density and structure, unspecified site: Secondary | ICD-10-CM

## 2017-05-24 DIAGNOSIS — I10 Essential (primary) hypertension: Secondary | ICD-10-CM | POA: Diagnosis not present

## 2017-05-24 DIAGNOSIS — E782 Mixed hyperlipidemia: Secondary | ICD-10-CM

## 2017-05-24 DIAGNOSIS — I482 Chronic atrial fibrillation, unspecified: Secondary | ICD-10-CM

## 2017-05-24 DIAGNOSIS — R739 Hyperglycemia, unspecified: Secondary | ICD-10-CM | POA: Diagnosis not present

## 2017-05-24 DIAGNOSIS — Z13820 Encounter for screening for osteoporosis: Secondary | ICD-10-CM

## 2017-05-24 DIAGNOSIS — E2839 Other primary ovarian failure: Secondary | ICD-10-CM | POA: Diagnosis not present

## 2017-05-24 LAB — CBC WITH DIFFERENTIAL/PLATELET
BASOS PCT: 0.7 % (ref 0.0–3.0)
Basophils Absolute: 0.1 10*3/uL (ref 0.0–0.1)
EOS PCT: 0.8 % (ref 0.0–5.0)
Eosinophils Absolute: 0.1 10*3/uL (ref 0.0–0.7)
HCT: 45.1 % (ref 36.0–46.0)
HEMOGLOBIN: 15 g/dL (ref 12.0–15.0)
LYMPHS PCT: 47.6 % — AB (ref 12.0–46.0)
Lymphs Abs: 4.8 10*3/uL — ABNORMAL HIGH (ref 0.7–4.0)
MCHC: 33.2 g/dL (ref 30.0–36.0)
MCV: 96 fl (ref 78.0–100.0)
MONO ABS: 1 10*3/uL (ref 0.1–1.0)
Monocytes Relative: 9.4 % (ref 3.0–12.0)
NEUTROS ABS: 4.2 10*3/uL (ref 1.4–7.7)
Neutrophils Relative %: 41.5 % — ABNORMAL LOW (ref 43.0–77.0)
PLATELETS: 257 10*3/uL (ref 150.0–400.0)
RBC: 4.69 Mil/uL (ref 3.87–5.11)
RDW: 13.3 % (ref 11.5–15.5)
WBC: 10.2 10*3/uL (ref 4.0–10.5)

## 2017-05-24 LAB — LIPID PANEL
CHOLESTEROL: 194 mg/dL (ref 0–200)
HDL: 65.3 mg/dL (ref >39.00)
LDL Cholesterol: 101 mg/dL — ABNORMAL HIGH (ref 0–99)
NonHDL: 128.83
TRIGLYCERIDES: 141 mg/dL (ref 0.0–149.0)
Total CHOL/HDL Ratio: 3
VLDL: 28.2 mg/dL (ref 0.0–40.0)

## 2017-05-24 LAB — COMPREHENSIVE METABOLIC PANEL
ALBUMIN: 4.1 g/dL (ref 3.5–5.2)
ALT: 15 U/L (ref 0–35)
AST: 15 U/L (ref 0–37)
Alkaline Phosphatase: 71 U/L (ref 39–117)
BUN: 22 mg/dL (ref 6–23)
CALCIUM: 9.2 mg/dL (ref 8.4–10.5)
CHLORIDE: 101 meq/L (ref 96–112)
CO2: 31 mEq/L (ref 19–32)
Creatinine, Ser: 0.78 mg/dL (ref 0.40–1.20)
GFR: 77.55 mL/min (ref >60.00)
Glucose, Bld: 79 mg/dL (ref 70–99)
POTASSIUM: 3.3 meq/L — AB (ref 3.5–5.1)
Sodium: 141 mEq/L (ref 135–145)
Total Bilirubin: 0.5 mg/dL (ref 0.2–1.2)
Total Protein: 6.7 g/dL (ref 6.0–8.3)

## 2017-05-24 LAB — TSH: TSH: 1.82 u[IU]/mL (ref 0.35–4.50)

## 2017-05-24 LAB — HEMOGLOBIN A1C: HEMOGLOBIN A1C: 5.8 % (ref 4.6–6.5)

## 2017-05-28 ENCOUNTER — Encounter: Payer: Self-pay | Admitting: Internal Medicine

## 2017-05-30 ENCOUNTER — Telehealth: Payer: Self-pay | Admitting: Internal Medicine

## 2017-05-30 NOTE — Telephone Encounter (Signed)
Pt called for her results from 10/18, she would like to know if she needs a new potassium Rx since this was increased, if so [facility] [address] Please advise

## 2017-05-31 MED ORDER — POTASSIUM CHLORIDE CRYS ER 20 MEQ PO TBCR: 20.0000 meq | EXTENDED_RELEASE_TABLET | Freq: Every day | ORAL | 1 refills | Status: DC

## 2017-05-31 NOTE — Telephone Encounter (Signed)
Spoke with pt to inform that she can double her current RX and will let us know when she needs a refill.

## 2017-06-16 ENCOUNTER — Other Ambulatory Visit: Payer: Self-pay | Admitting: Internal Medicine

## 2017-06-18 NOTE — Telephone Encounter (Signed)
 Controlled Substance Database checked. Last filled on 03/19/17

## 2017-06-19 NOTE — Telephone Encounter (Signed)
RX faxed to local [facility] 

## 2017-06-20 ENCOUNTER — Other Ambulatory Visit (HOSPITAL_COMMUNITY): Payer: Self-pay | Admitting: *Deleted

## 2017-06-20 MED ORDER — RIVAROXABAN 20 MG PO TABS: ORAL_TABLET | ORAL | 1 refills | Status: DC

## 2017-07-09 ENCOUNTER — Other Ambulatory Visit: Payer: Self-pay | Admitting: Internal Medicine

## 2017-07-09 NOTE — Telephone Encounter (Signed)
LVM for pt to call back and discuss.  

## 2017-07-09 NOTE — Telephone Encounter (Signed)
Pt calling asking if Lisinopril and Pravastatin could be refilled for 6 months instead of 3 months. Pt notified to contact pharmacy when refills are needed, which the pt states she did. Last refill of Pravastatin on 10/30/16 for 90tabs with one refill and Lisinopril refilled on 10/9 with one refill.   Pt states she saw her current prescription for for Potassium 56meQ and states the pill was too big for her to take and wanted her to know if prescription could be written for 50meQ tabs because those tablets were easier to swallow.  Pt advised that the tablet should be scored, which would allow her to break the tablet in half to take easier. Pt verbalized understanding and will see if the pills are scored when she picks them up from the pharmacy. Pt also voicing concern of not having a refill of Tramadol( last filled on 06/18/17) when she usually gets a 3 month supply. Pt would like a return call to know if she has to call back every month for the Tramadol refill or is she could have additional refills on the original prescription.

## 2017-07-10 ENCOUNTER — Telehealth: Payer: Self-pay | Admitting: Internal Medicine

## 2017-07-10 MED ORDER — HYDROCHLOROTHIAZIDE 25 MG PO TABS: 25.0000 mg | ORAL_TABLET | Freq: Every day | ORAL | 3 refills | Status: DC

## 2017-07-10 MED ORDER — TRAMADOL HCL 50 MG PO TABS: 50.0000 mg | ORAL_TABLET | Freq: Two times a day (BID) | ORAL | 0 refills | Status: DC | PRN

## 2017-07-10 MED ORDER — POTASSIUM CHLORIDE ER 10 MEQ PO TBCR: 10.0000 meq | EXTENDED_RELEASE_TABLET | Freq: Two times a day (BID) | ORAL | 3 refills | Status: DC

## 2017-07-10 MED ORDER — LISINOPRIL 40 MG PO TABS: 40.0000 mg | ORAL_TABLET | Freq: Every day | ORAL | 3 refills | Status: DC

## 2017-07-10 MED ORDER — PRAVASTATIN SODIUM 20 MG PO TABS: 20.0000 mg | ORAL_TABLET | Freq: Every day | ORAL | 3 refills | Status: DC

## 2017-07-10 NOTE — Addendum Note (Signed)
Addended by: Terence Lux B on: 07/10/2017 04:36 PM   Modules accepted: Orders

## 2017-07-10 NOTE — Telephone Encounter (Signed)
Patient is returning Taylor's call. 

## 2017-07-10 NOTE — Telephone Encounter (Signed)
[place] Controlled Substance Database checked. Last filled on 06/19/17. Noted on RX when RX can be filled

## 2017-07-10 NOTE — Telephone Encounter (Signed)
Please see telephone encounter from yesterday.

## 2017-07-10 NOTE — Telephone Encounter (Signed)
Spoke with pt, pt was unclear what needed to be. I advised [facility] would [facility] and per dr burns okay to change potassium to 10 meq BID.

## 2017-07-23 ENCOUNTER — Telehealth: Payer: Self-pay | Admitting: Internal Medicine

## 2017-07-23 NOTE — Telephone Encounter (Signed)
Copied from [place] [id number]. Topic: Quick Communication - See Telephone Encounter >> Jul 23, 2017  3:00 PM Corie Chiquito, [place] wrote: CRM for notification. See Telephone encounter for: Patient needs a new prescription on her Tramadol because she is in [place]. [facility] [address] [facility] [place] fax number to this pharmacy is [phone]. If someone could please give her a call back about this at [phone]  07/23/17.

## 2017-07-23 NOTE — Telephone Encounter (Signed)
Pt  Requesting  A  New prescription  For    Tramadol

## 2017-07-24 MED ORDER — TRAMADOL HCL 50 MG PO TABS: 50.0000 mg | ORAL_TABLET | Freq: Two times a day (BID) | ORAL | 0 refills | Status: DC | PRN

## 2017-07-24 NOTE — Telephone Encounter (Signed)
Check [place] registry last filled 06/19/2017...[name]

## 2017-07-24 NOTE — Telephone Encounter (Signed)
Notified pt rx has been sent to requested pharmacy../lmb 

## 2017-07-24 NOTE — Telephone Encounter (Signed)
Prescription sent

## 2017-08-14 ENCOUNTER — Ambulatory Visit: Payer: Medicare Other | Admitting: Rheumatology

## 2017-09-17 ENCOUNTER — Telehealth: Payer: Self-pay | Admitting: Internal Medicine

## 2017-09-17 MED ORDER — TRAMADOL HCL 50 MG PO TABS: 50.0000 mg | ORAL_TABLET | Freq: Two times a day (BID) | ORAL | 0 refills | Status: DC | PRN

## 2017-09-17 NOTE — Telephone Encounter (Signed)
Copied from [place]. Topic: Quick Communication - Rx Refill/Question >> Sep 17, 2017  1:56 PM Margot Ables wrote: Medication: tramadol 50mg  - has 5 left - takes 2/day  Has the patient contacted their pharmacy? Yes.   Preferred Pharmacy (with phone number or street name): [id number] [place], [place] [phone] (Phone) [phone] (Fax)

## 2017-09-17 NOTE — Telephone Encounter (Signed)
Check [place] registry last filled 06/19/2017.Marland KitchenJohny [name]

## 2017-09-18 NOTE — Telephone Encounter (Signed)
Called pt no answer LMOM MD sent rx to requested pharmacy.Marland KitchenJohny [name]

## 2017-09-19 ENCOUNTER — Other Ambulatory Visit: Payer: Self-pay | Admitting: Internal Medicine

## 2017-09-19 MED ORDER — TRAMADOL HCL 50 MG PO TABS: 50.0000 mg | ORAL_TABLET | Freq: Two times a day (BID) | ORAL | 0 refills | Status: DC

## 2017-09-19 NOTE — Telephone Encounter (Signed)
Copied from [place]. Topic: Quick Communication - See Telephone Encounter >> Sep 19, 2017 10:51 AM Cleaster Corin, [place] wrote: CRM for notification. See Telephone encounter for:   09/19/17. Pt. Calling and stating that insurance will not approve tramadol because its to early for the rx. Pt. Takes 2 a day and only has 3 left. Pt. Seeing if Dr. Quay Burow will override it. Pt. Is in [place] and would [facility].  [id number] [place] [place] - [address] [phone] (Phone) [phone] (Fax)

## 2017-09-19 NOTE — Telephone Encounter (Signed)
Spoke with pt and advised that insurance is putting a hold on the Tramadol bc the sig states only PRN and they once cover a certain amount of refill per year. Can was D/c the last RX and change the sig to 1 tab every 12 hours. Pt uses this with tylenol and does not care to change to a stronger medication.

## 2017-09-25 ENCOUNTER — Other Ambulatory Visit: Payer: Self-pay | Admitting: Obstetrics & Gynecology

## 2017-09-25 DIAGNOSIS — Z1231 Encounter for screening mammogram for malignant neoplasm of breast: Secondary | ICD-10-CM

## 2017-09-29 ENCOUNTER — Other Ambulatory Visit: Payer: Self-pay | Admitting: Internal Medicine

## 2017-10-01 MED ORDER — TRAMADOL HCL 50 MG PO TABS: 50.0000 mg | ORAL_TABLET | Freq: Two times a day (BID) | ORAL | 0 refills | Status: DC

## 2017-10-01 NOTE — Telephone Encounter (Signed)
Unable to see on [place] controlled, pt has been getting filled in [place]

## 2017-10-23 DIAGNOSIS — M7062 Trochanteric bursitis, left hip: Secondary | ICD-10-CM | POA: Diagnosis not present

## 2017-10-23 DIAGNOSIS — M7061 Trochanteric bursitis, right hip: Secondary | ICD-10-CM | POA: Diagnosis not present

## 2017-10-23 DIAGNOSIS — M1611 Unilateral primary osteoarthritis, right hip: Secondary | ICD-10-CM | POA: Diagnosis not present

## 2017-10-30 ENCOUNTER — Telehealth: Payer: Self-pay | Admitting: Internal Medicine

## 2017-10-30 NOTE — Telephone Encounter (Signed)
Rec'd from [facility] forwarded 3 pages to Dr.Burns

## 2017-11-04 NOTE — Patient Instructions (Addendum)
  Test(s) ordered today. Your results will be released to MyChart (or called to you) after review, usually within 72hours after test completion. If any changes need to be made, you will be notified at that same time.  Medications reviewed and updated.  No changes recommended at this time.    Please followup in 6 months   

## 2017-11-04 NOTE — Progress Notes (Signed)
Subjective:    Patient ID: [name], female    DOB: Dec 01, 1945, 72 y.o.   MRN: [id number]  HPI The patient is here for follow up.  Hypertension: She is taking her medication daily. She is compliant with a low sodium diet.  She denies chest pain, palpitations, edema, shortness of breath and regular headaches. She is exercising regularly - three times a week exercise classes.      Hyperlipidemia: She is taking her medication daily. She is compliant with a low fat/cholesterol diet. She is exercising regularly. She denies myalgias.   Prediabetes:  She is not compliant with a low sugar/carbohydrate diet.  She is exercising regularly.  OA of bilateral hips and feet, hip bursitis:  She is taking tramadol plus tylenol twice a day.  The medication works well and she denies side effects.  Her pain is tolerable.  she can not do a lot of walking.  She did see ortho recently and does not think she has hip bursitis - she thinks it may be something from her lower back.  She advised an MRI but will consider this in the future - she will be going on a trip for 45 days soon.      Medications and allergies reviewed with patient and updated if appropriate.  Patient Active Problem List   Diagnosis Date Noted  . Prediabetes 05/15/2017  . Pain of right heel 01/09/2017  . Callous ulcer ([place]) 01/09/2017  . Primary osteoarthritis of both feet 07/10/2016  . Primary osteoarthritis of both hands 07/10/2016  . Alopecia totalis 06/05/2016  . Dermatochalasis of both upper eyelids 10/12/2015  . H/O dysplastic nevus 08/03/2015  . Obese 10/14/2014  . Neck muscle spasm 05/22/2014  . Degenerative lumbar disc 12/04/2013  . Hematuria, undiagnosed cause 09/02/2013  . Osteopenia 06/11/2013  . Primary generalized (osteo)arthritis 12/18/2011  . Mixed hyperlipidemia 03/25/2009  . Essential hypertension 03/24/2009  . MITRAL VALVE PROLAPSE 03/24/2009  . ATRIAL FIBRILLATION 03/24/2009  . COCCYGEAL PAIN 02/12/2007   . Greater trochanteric bursitis of left hip 02/12/2007    Current Outpatient Medications on File Prior to Visit  Medication Sig Dispense Refill  . bifidobacterium infantis (ALIGN) capsule Take 1 capsule by mouth daily. 30 capsule 11  . cholecalciferol (VITAMIN D) 1000 UNITS tablet Take 2,000 Units by mouth daily.    [name] co-enzyme Q-10 50 MG capsule Take 50 mg by mouth daily.    [name] diltiazem (CARDIZEM CD) 360 MG 24 hr capsule Take 1 capsule (360 mg total) by mouth daily. 90 capsule 3  . diltiazem (CARDIZEM) 30 MG tablet Take 1 tablet every 4 hours AS NEEDED for AFIB heart rate >100 45 tablet 1  . ferrous sulfate 325 (65 FE) MG EC tablet Take 325 mg by mouth daily.    . hydrochlorothiazide (HYDRODIURIL) 25 MG tablet Take 1 tablet (25 mg total) by mouth daily. 90 tablet 3  . lisinopril (PRINIVIL,ZESTRIL) 40 MG tablet Take 1 tablet (40 mg total) by mouth daily. 90 tablet 3  . potassium chloride (K-DUR) 10 MEQ tablet Take 1 tablet (10 mEq total) by mouth 2 (two) times daily. 180 tablet 3  . pravastatin (PRAVACHOL) 20 MG tablet Take 1 tablet (20 mg total) by mouth daily. 90 tablet 3  . rivaroxaban (XARELTO) 20 MG TABS tablet TAKE 1 TABLET BY MOUTH EVERY DAY WITH SUPPER 90 tablet 1  . sodium fluoride (DENTA 5000 PLUS) 1.1 % CREA dental cream     . traMADol (ULTRAM) 50 MG  tablet Take 1 tablet (50 mg total) by mouth every 12 (twelve) hours. 180 tablet 0   No current facility-administered medications on file prior to visit.     Past Medical History:  Diagnosis Date  . ARTHRITIS, HIPS, BILATERAL   . Atrial fibrillation (HCC)    chronic anticoag - Pradaxa  . BURSITIS, SUBTROCHANTERIC   . HYPERTENSION   . MITRAL VALVE PROLAPSE   . Mixed hyperlipidemia     Past Surgical History:  Procedure Laterality Date  . DILATION AND CURETTAGE OF UTERUS    . TONSILLECTOMY      Social History   Socioeconomic History  . Marital status: Married    Spouse name: Not on file  . Number of children: Not on  file  . Years of education: Not on file  . Highest education level: Not on file  Occupational History  . Not on file  Social Needs  . Financial resource strain: Not on file  . Food insecurity:    Worry: Not on file    Inability: Not on file  . Transportation needs:    Medical: Not on file    Non-medical: Not on file  Tobacco Use  . Smoking status: Former Smoker    Years: 1.00  . Smokeless tobacco: Never Used  . Tobacco comment: social age 13  Substance and Sexual Activity  . Alcohol use: No  . Drug use: No  . Sexual activity: Not on file  Lifestyle  . Physical activity:    Days per week: Not on file    Minutes per session: Not on file  . Stress: Not on file  Relationships  . Social connections:    Talks on phone: Not on file    Gets together: Not on file    Attends religious service: Not on file    Active member of club or organization: Not on file    Attends meetings of clubs or organizations: Not on file    Relationship status: Not on file  Other Topics Concern  . Not on file  Social History Narrative  . Not on file    Family History  Problem Relation Age of Onset  . Hypertension Mother   . Heart failure Mother   . Leukemia Father     Review of Systems  Constitutional: Negative for fever.  Respiratory: Negative for cough, shortness of breath and wheezing.   Cardiovascular: Negative for chest pain, palpitations and leg swelling.  Neurological: Negative for light-headedness and headaches.       Objective:   Vitals:   11/06/17 0745  BP: 130/76  Pulse: 85  Resp: 16  Temp: 97.9 F (36.6 C)  SpO2: 98%   BP Readings from Last 3 Encounters:  11/06/17 130/76  05/15/17 120/62  01/09/17 118/82   Wt Readings from Last 3 Encounters:  11/06/17 169 lb (76.7 kg)  05/15/17 170 lb (77.1 kg)  01/09/17 163 lb (73.9 kg)   Body mass index is 29.01 kg/m.   Physical Exam    Constitutional: Appears well-developed and well-nourished. No distress.  HENT:    Head: Normocephalic and atraumatic.  Neck: Neck supple. No tracheal deviation present. No thyromegaly present.  No cervical lymphadenopathy Cardiovascular: Normal rate with premature beats, regular rhythm and normal heart sounds.   No murmur heard. No carotid bruit .  No edema Pulmonary/Chest: Effort normal and breath sounds normal. No respiratory distress. No has no wheezes. No rales.  Skin: Skin is warm and dry. Not diaphoretic.  Psychiatric:  Normal mood and affect. Behavior is normal.      Assessment & Plan:    See Problem List for Assessment and Plan of chronic medical problems.

## 2017-11-06 ENCOUNTER — Encounter: Payer: Self-pay | Admitting: Internal Medicine

## 2017-11-06 ENCOUNTER — Ambulatory Visit (INDEPENDENT_AMBULATORY_CARE_PROVIDER_SITE_OTHER): Payer: Medicare Other | Admitting: Internal Medicine

## 2017-11-06 ENCOUNTER — Other Ambulatory Visit (INDEPENDENT_AMBULATORY_CARE_PROVIDER_SITE_OTHER): Payer: Medicare Other

## 2017-11-06 VITALS — BP 130/76 | HR 85 | Temp 97.9°F | Resp 16 | Wt 169.0 lb

## 2017-11-06 DIAGNOSIS — E782 Mixed hyperlipidemia: Secondary | ICD-10-CM | POA: Diagnosis not present

## 2017-11-06 DIAGNOSIS — I482 Chronic atrial fibrillation, unspecified: Secondary | ICD-10-CM

## 2017-11-06 DIAGNOSIS — R7303 Prediabetes: Secondary | ICD-10-CM

## 2017-11-06 DIAGNOSIS — I10 Essential (primary) hypertension: Secondary | ICD-10-CM

## 2017-11-06 DIAGNOSIS — M150 Primary generalized (osteo)arthritis: Secondary | ICD-10-CM

## 2017-11-06 LAB — COMPREHENSIVE METABOLIC PANEL
ALT: 16 U/L (ref 0–35)
AST: 16 U/L (ref 0–37)
Albumin: 4.2 g/dL (ref 3.5–5.2)
Alkaline Phosphatase: 83 U/L (ref 39–117)
BILIRUBIN TOTAL: 0.5 mg/dL (ref 0.2–1.2)
BUN: 23 mg/dL (ref 6–23)
CALCIUM: 9.3 mg/dL (ref 8.4–10.5)
CO2: 28 meq/L (ref 19–32)
Chloride: 101 mEq/L (ref 96–112)
Creatinine, Ser: 0.69 mg/dL (ref 0.40–1.20)
GFR: 89.22 mL/min (ref >60.00)
GLUCOSE: 84 mg/dL (ref 70–99)
Potassium: 3.8 mEq/L (ref 3.5–5.1)
Sodium: 138 mEq/L (ref 135–145)
Total Protein: 7 g/dL (ref 6.0–8.3)

## 2017-11-06 LAB — CBC WITH DIFFERENTIAL/PLATELET
BASOS ABS: 0.1 10*3/uL (ref 0.0–0.1)
Basophils Relative: 0.8 % (ref 0.0–3.0)
EOS PCT: 0.7 % (ref 0.0–5.0)
Eosinophils Absolute: 0.1 10*3/uL (ref 0.0–0.7)
HCT: 45.9 % (ref 36.0–46.0)
Hemoglobin: 15.5 g/dL — ABNORMAL HIGH (ref 12.0–15.0)
LYMPHS PCT: 20.7 % (ref 12.0–46.0)
Lymphs Abs: 2.5 10*3/uL (ref 0.7–4.0)
MCHC: 33.7 g/dL (ref 30.0–36.0)
MCV: 98.1 fl (ref 78.0–100.0)
MONOS PCT: 8.4 % (ref 3.0–12.0)
Monocytes Absolute: 1 10*3/uL (ref 0.1–1.0)
NEUTROS PCT: 69.4 % (ref 43.0–77.0)
Neutro Abs: 8.5 10*3/uL — ABNORMAL HIGH (ref 1.4–7.7)
Platelets: 235 10*3/uL (ref 150.0–400.0)
RBC: 4.68 Mil/uL (ref 3.87–5.11)
RDW: 13.5 % (ref 11.5–15.5)
WBC: 12.2 10*3/uL — ABNORMAL HIGH (ref 4.0–10.5)

## 2017-11-06 LAB — LIPID PANEL
Cholesterol: 210 mg/dL — ABNORMAL HIGH (ref 0–200)
HDL: 82.9 mg/dL (ref >39.00)
LDL CALC: 105 mg/dL — AB (ref 0–99)
NONHDL: 126.64
Total CHOL/HDL Ratio: 3
Triglycerides: 107 mg/dL (ref 0.0–149.0)
VLDL: 21.4 mg/dL (ref 0.0–40.0)

## 2017-11-06 LAB — HEMOGLOBIN A1C: HEMOGLOBIN A1C: 5.8 % (ref 4.6–6.5)

## 2017-11-06 NOTE — Assessment & Plan Note (Signed)
Check a1c Low sugar / carb diet Stressed regular exercise   

## 2017-11-06 NOTE — Assessment & Plan Note (Signed)
BP well controlled Current regimen effective and well tolerated Continue current medications at current doses cmp  

## 2017-11-06 NOTE — Assessment & Plan Note (Signed)
Pain is controlled/tolerable with tramadol and tylenol Continue tramadol and tylenol

## 2017-11-06 NOTE — Assessment & Plan Note (Signed)
Check lipid panel  Continue daily statin Regular exercise and healthy diet encouraged  

## 2017-11-06 NOTE — Assessment & Plan Note (Addendum)
Rate controlled  Takes rescue diltiazem < 1/week with "feeling sick" / Afib - denies ever feeling palpitations Taking xarelto, diliazem Cmp, cbc

## 2017-11-09 ENCOUNTER — Ambulatory Visit
Admission: RE | Admit: 2017-11-09 | Discharge: 2017-11-09 | Disposition: A | Discharge status: Home / Self Care | Payer: Medicare Other | Source: Ambulatory Visit | Attending: Obstetrics & Gynecology | Admitting: Obstetrics & Gynecology

## 2017-11-09 DIAGNOSIS — Z1231 Encounter for screening mammogram for malignant neoplasm of breast: Secondary | ICD-10-CM

## 2017-12-24 ENCOUNTER — Other Ambulatory Visit: Payer: Self-pay | Admitting: Internal Medicine

## 2017-12-25 NOTE — Telephone Encounter (Signed)
Last refill sent to [facility] on 10/01/17

## 2018-01-03 ENCOUNTER — Encounter: Payer: Self-pay | Admitting: Internal Medicine

## 2018-01-04 ENCOUNTER — Other Ambulatory Visit: Payer: Self-pay | Admitting: Emergency Medicine

## 2018-01-04 MED ORDER — TRAMADOL HCL 50 MG PO TABS
50.0000 mg | ORAL_TABLET | Freq: Two times a day (BID) | ORAL | 0 refills | Status: DC
Start: 2018-01-04 — End: 2018-05-02

## 2018-01-04 NOTE — Telephone Encounter (Signed)
Pt is requesting a 90 rx due to being out of town so often. [place] Controlled Substance Database checked. Last filled on 01/01/18. Pt is aware RX can not be filled until 01/30/18

## 2018-01-09 ENCOUNTER — Ambulatory Visit: Payer: BLUE CROSS/BLUE SHIELD | Admitting: Podiatry

## 2018-01-19 ENCOUNTER — Other Ambulatory Visit (HOSPITAL_COMMUNITY): Payer: Self-pay | Admitting: Nurse Practitioner

## 2018-01-21 ENCOUNTER — Encounter: Payer: Self-pay | Admitting: Podiatry

## 2018-01-21 ENCOUNTER — Ambulatory Visit (INDEPENDENT_AMBULATORY_CARE_PROVIDER_SITE_OTHER): Payer: Medicare Other | Admitting: Podiatry

## 2018-01-21 DIAGNOSIS — B079 Viral wart, unspecified: Secondary | ICD-10-CM | POA: Diagnosis not present

## 2018-01-21 DIAGNOSIS — M2042 Other hammer toe(s) (acquired), left foot: Secondary | ICD-10-CM

## 2018-01-21 NOTE — Patient Instructions (Signed)
Warts Warts are small growths on the skin. They are common, and they are caused by a type of germ (virus). Warts can occur on many areas of the body. A person may have one wart or more than one wart. Warts can spread if you scratch a wart and then scratch normal skin. Most warts will go away over many months to a couple years. Treatments may be done if needed. Follow these instructions at home:  Apply over-the-counter and prescription medicines only as told by your doctor.  Do not apply over-the-counter wart medicines to your face or genitals before you ask your doctor if it is okay to do that.  Do not scratch or pick at a wart.  Wash your hands after you touch a wart.  Avoid shaving hair that is over a wart.  Keep all follow-up visits as told by your doctor. This is important. Contact a doctor if:  Your warts do not improve after treatment.  You have redness, swelling, or pain at the site of a wart.  You have bleeding from a wart, and the bleeding does not stop when you put light pressure on the wart.  You have diabetes and you get a wart. This information is not intended to replace advice given to you by your health care provider. Make sure you discuss any questions you have with your health care provider. Document Released: 11/24/2010 Document Revised: 12/30/2015 Document Reviewed: 10/19/2014 Elsevier Interactive Patient Education  2018 Elsevier Inc.  

## 2018-01-23 NOTE — Progress Notes (Signed)
Subjective:   Patient ID: [name], female   DOB: 72 y.o.   MRN: [id number]   HPI Patient presents with new lesion on the plantar aspect of the right heel that is been present for couple months and is mildly painful and has digital deformity second left with blister formation at times and pressure between the big toe and second toe   ROS      Objective:  Physical Exam  Neurovascular status intact with lesion formation plantar medial aspect right heel that upon debridement shows pinpoint bleeding and is about 5 x 5 mm in size.  Patient is also noted to have on the left a slight rotation of the second digit with pressure between the hallux and second toe and keratotic tissue formation of the minimal nature     Assessment:  Verruca plantaris plantar aspect right reoccurrence from last year and hammertoe deformity second left     Plan:  H&P both conditions reviewed.  For the verruca I did debride the lesion with sterile instrumentation and applied chemical agent to create immune response and sterile dressing.  For the left I recommended digital padding which I dispensed today wider shoes the possibility for arthroplasty if symptoms were to get worse in the future

## 2018-01-31 ENCOUNTER — Other Ambulatory Visit (HOSPITAL_COMMUNITY): Payer: Self-pay | Admitting: Nurse Practitioner

## 2018-02-01 DIAGNOSIS — Z961 Presence of intraocular lens: Secondary | ICD-10-CM | POA: Diagnosis not present

## 2018-02-01 DIAGNOSIS — H52203 Unspecified astigmatism, bilateral: Secondary | ICD-10-CM | POA: Diagnosis not present

## 2018-02-18 ENCOUNTER — Ambulatory Visit: Payer: Medicare Other

## 2018-04-11 ENCOUNTER — Encounter: Payer: Self-pay | Admitting: Internal Medicine

## 2018-04-12 ENCOUNTER — Encounter: Payer: Self-pay | Admitting: Internal Medicine

## 2018-04-12 ENCOUNTER — Ambulatory Visit (INDEPENDENT_AMBULATORY_CARE_PROVIDER_SITE_OTHER): Payer: Medicare Other | Admitting: Internal Medicine

## 2018-04-12 VITALS — BP 122/68 | HR 85 | Temp 98.1°F | Resp 16 | Ht 64.0 in | Wt 162.0 lb

## 2018-04-12 DIAGNOSIS — H6993 Unspecified Eustachian tube disorder, bilateral: Secondary | ICD-10-CM | POA: Insufficient documentation

## 2018-04-12 DIAGNOSIS — Z23 Encounter for immunization: Secondary | ICD-10-CM | POA: Diagnosis not present

## 2018-04-12 DIAGNOSIS — H6983 Other specified disorders of Eustachian tube, bilateral: Secondary | ICD-10-CM | POA: Diagnosis not present

## 2018-04-12 MED ORDER — TRIAMCINOLONE ACETONIDE 55 MCG/ACT NA AERO: 2.0000 | INHALATION_SPRAY | Freq: Every day | NASAL | 12 refills | Status: DC

## 2018-04-12 NOTE — Assessment & Plan Note (Signed)
She is experiencing eustachian tube dysfunction resulting in some hearing loss and possible effusion in the right ear No obvious active infection Possible hearing loss from above Will refer to ENT Trial of Nasacort nasal spray-use saline nasal spray multiple times a day to prevent too much dryness since she is on the Xarelto.  Stop if she has any nosebleeds.  Discussed that she can apply Vaseline if she does see bleeding Call with questions Further treatment per ENT

## 2018-04-12 NOTE — Progress Notes (Signed)
Subjective:    Patient ID: [name], female    DOB: [birth year], 72 y.o.   MRN: [id number]  HPI The patient is here for an acute visit for bilateral ear pain  Left for [place] by car on July 9th and on august first she had an URI with severe congestion.  She woke up with right ear pain and the next night left ear pain.  The left ear started bleeding.  She lost her hearing and felt underwater for days.  On august 10th she went to a small hospital.  She was told the left ear was very infected.  They were not able to see her tympanic membrane and thought it was probably perforated since it was bleeding.  They gave her IV antibiotics and sent her home on Cipro. The IV Abx helped the ear pain.   On August 12th she went to the emergency department because she had nonbloody drainage out of the left ear.  She still was not able to hear.  She wanted to see a specialist, but they did not think she needed to see one.  No new medications were given.  She was not tolerating the Cipro and she was told to stop it.  She waited another 5 days and then went back - she still could not hear, she did not have pain.  She had popping, crackling and gurgling.  The doctor saw something in the left ear -she was not able to get it out.  She was referred to an ENT.  August 20th saw ENT -- he took out a blood clot / debris.  There was no TM perforation.  She could still not hear.   No medication was given.    Her hearing improved over the next 7 days.   The hearing is better, but she cannot tell if there is still some hearing loss.  She would like to have hearing evaluation.  She currently denies any ear pain.  Right now her symptoms are :   She has occasional pressure in her left ear.  When she yawns or burps her ears pop. She has a gurgling/crackling sound still in her ears - it is better than it was.  She denies drainage.  She may have some hearing change - some mild white noise sound at times.  When she was  an adult she had tubes in her ears.  ( around age 82)    Medications and allergies reviewed with patient and updated if appropriate.  Patient Active Problem List   Diagnosis Date Noted  . Prediabetes 05/15/2017  . Pain of right heel 01/09/2017  . Callous ulcer ([place]) 01/09/2017  . Primary osteoarthritis of both feet 07/10/2016  . Primary osteoarthritis of both hands 07/10/2016  . Alopecia totalis 06/05/2016  . Dermatochalasis of both upper eyelids 10/12/2015  . H/O dysplastic nevus 08/03/2015  . Obese 10/14/2014  . Neck muscle spasm 05/22/2014  . Degenerative lumbar disc 12/04/2013  . Hematuria, undiagnosed cause 09/02/2013  . Osteopenia 06/11/2013  . Primary generalized (osteo)arthritis 12/18/2011  . Mixed hyperlipidemia 03/25/2009  . Essential hypertension 03/24/2009  . MITRAL VALVE PROLAPSE 03/24/2009  . ATRIAL FIBRILLATION 03/24/2009  . COCCYGEAL PAIN 02/12/2007  . Greater trochanteric bursitis of left hip 02/12/2007    Current Outpatient Medications on File Prior to Visit  Medication Sig Dispense Refill  . bifidobacterium infantis (ALIGN) capsule Take 1 capsule by mouth daily. 30 capsule 11  . cholecalciferol (VITAMIN D) 1000 UNITS tablet  Take 2,000 Units by mouth daily.    [name] co-enzyme Q-10 50 MG capsule Take 50 mg by mouth daily.    [name] diltiazem (CARDIZEM CD) 360 MG 24 hr capsule TAKE 1 CAPSULE (360 MG TOTAL) BY MOUTH DAILY. 90 capsule 0  . diltiazem (CARDIZEM) 30 MG tablet TAKE 1 TABLET EVERY 4 HOURS AS NEEDED FOR AFIB HEART RATE >100 45 tablet 1  . ferrous sulfate 325 (65 FE) MG EC tablet Take 325 mg by mouth daily.    . hydrochlorothiazide (HYDRODIURIL) 25 MG tablet Take 1 tablet (25 mg total) by mouth daily. 90 tablet 3  . KLOR-CON M10 10 MEQ tablet TAKE 1 TABLET (10 MEQ TOTAL) BY MOUTH 2 (TWO) TIMES DAILY  3  . lisinopril (PRINIVIL,ZESTRIL) 40 MG tablet Take 1 tablet (40 mg total) by mouth daily. 90 tablet 3  . potassium chloride (K-DUR) 10 MEQ tablet Take 1 tablet  (10 mEq total) by mouth 2 (two) times daily. 180 tablet 3  . pravastatin (PRAVACHOL) 20 MG tablet Take 1 tablet (20 mg total) by mouth daily. 90 tablet 3  . sodium fluoride (DENTA 5000 PLUS) 1.1 % CREA dental cream     . traMADol (ULTRAM) 50 MG tablet Take 1 tablet (50 mg total) by mouth every 12 (twelve) hours. 180 tablet 0  . XARELTO 20 MG TABS tablet TAKE 1 TABLET BY MOUTH EVERY DAY WITH SUPPER 90 tablet 0   No current facility-administered medications on file prior to visit.     Past Medical History:  Diagnosis Date  . ARTHRITIS, HIPS, BILATERAL   . Atrial fibrillation (HCC)    chronic anticoag - Pradaxa  . BURSITIS, SUBTROCHANTERIC   . HYPERTENSION   . MITRAL VALVE PROLAPSE   . Mixed hyperlipidemia     Past Surgical History:  Procedure Laterality Date  . DILATION AND CURETTAGE OF UTERUS    . TONSILLECTOMY      Social History   Socioeconomic History  . Marital status: Married    Spouse name: Not on file  . Number of children: Not on file  . Years of education: Not on file  . Highest education level: Not on file  Occupational History  . Not on file  Social Needs  . Financial resource strain: Not on file  . Food insecurity:    Worry: Not on file    Inability: Not on file  . Transportation needs:    Medical: Not on file    Non-medical: Not on file  Tobacco Use  . Smoking status: Former Smoker    Years: 1.00  . Smokeless tobacco: Never Used  . Tobacco comment: social age 44  Substance and Sexual Activity  . Alcohol use: No  . Drug use: No  . Sexual activity: Not on file  Lifestyle  . Physical activity:    Days per week: Not on file    Minutes per session: Not on file  . Stress: Not on file  Relationships  . Social connections:    Talks on phone: Not on file    Gets together: Not on file    Attends religious service: Not on file    Active member of club or organization: Not on file    Attends meetings of clubs or organizations: Not on file     Relationship status: Not on file  Other Topics Concern  . Not on file  Social History Narrative  . Not on file    Family History  Problem Relation Age of  Onset  . Hypertension Mother   . Heart failure Mother   . Leukemia Father     Review of Systems  Constitutional: Negative for chills and fever.  HENT: Negative for congestion, ear discharge, ear pain, postnasal drip, sinus pressure, sinus pain and sore throat.   Respiratory: Negative for cough.   Neurological: Negative for dizziness, light-headedness and headaches.       Objective:   Vitals:   04/12/18 1525  BP: 122/68  Pulse: 85  Resp: 16  Temp: 98.1 F (36.7 C)  SpO2: 98%   BP Readings from Last 3 Encounters:  04/12/18 122/68  11/06/17 130/76  05/15/17 120/62   Wt Readings from Last 3 Encounters:  04/12/18 162 lb (73.5 kg)  11/06/17 169 lb (76.7 kg)  05/15/17 170 lb (77.1 kg)   Body mass index is 27.81 kg/m.   Physical Exam    GENERAL APPEARANCE: Appears stated age, well appearing, NAD EYES: conjunctiva clear, no icterus HEENT: Right ear canal with scant cerumen but no erythema, tympanic membrane looks slightly swollen with area of ecchymosis, possible effusion; left ear canal with scant cerumen, area of dried blood on upper posterior wall, no active bleeding, tympanic membrane with scar from previous tube, no perforation; oropharynx with no erythema, no thyromegaly, trachea midline, no cervical or supraclavicular lymphadenopathy LUNGS: Clear to auscultation without wheeze or crackles, unlabored breathing, good air entry bilaterally CARDIOVASCULAR: Normal S1,S2 without murmurs, no edema SKIN: Warm, dry      Assessment & Plan:    See Problem List for Assessment and Plan of chronic medical problems.

## 2018-04-12 NOTE — Patient Instructions (Signed)
Start using nasocort daily as prescribed.  Use saline nasal spray as many times throughout the day to keep the nasal passageways moist.    A referral was ordered for ENT.

## 2018-04-16 ENCOUNTER — Other Ambulatory Visit: Payer: Self-pay | Admitting: Internal Medicine

## 2018-04-17 NOTE — Progress Notes (Addendum)
Subjective:   [name] is a 72 y.o. female who presents for Medicare Annual (Subsequent) preventive examination.  Review of Systems:  No ROS.  Medicare Wellness Visit. Additional risk factors are reflected in the social history.  Cardiac Risk Factors include: advanced age (>90men, >89 women);dyslipidemia;hypertension Sleep patterns: gets up 1-2 times nightly to void and sleeps 6-8 hours nightly.    Home Safety/Smoke Alarms: Feels safe in home. Smoke alarms in place.  Living environment; residence and Firearm Safety: 1-story house/ trailer, no firearms. Lives with husband, no needs for DME, good support system Seat Belt Safety/Bike Helmet: Wears seat belt.      Objective:     Vitals: BP 116/62   Pulse 62   Resp 17   Ht 5\' 4"  (1.626 m)   Wt 163 lb (73.9 kg)   SpO2 98%   BMI 27.98 kg/m   Body mass index is 27.98 kg/m.  Advanced Directives 04/18/2018 11/15/2016 10/11/2016 03/28/2016 12/08/2015 10/17/2015 01/20/2015  Does Patient Have a Medical Advance Directive? Yes Yes Yes Yes Yes No Yes  Type of Paramedic of [place];Living will [place];Living will [place];Living will - Living will;Healthcare Power of [place];Living will  Does patient want to make changes to medical advance directive? - - - - - - -  Copy of [place] in Chart? No - copy requested No - copy requested - No - copy requested - - -  Would patient like information on creating a medical advance directive? - No - Patient declined - - - No - patient declined information -    Tobacco Social History   Tobacco Use  Smoking Status Former Smoker  . Years: 1.00  Smokeless Tobacco Never Used  Tobacco Comment   social age 55     Counseling given: Not Answered Comment: social age 55  Past Medical History:  Diagnosis Date  . ARTHRITIS, HIPS, BILATERAL   . Atrial fibrillation (HCC)    chronic  anticoag - Pradaxa  . BURSITIS, SUBTROCHANTERIC   . HYPERTENSION   . MITRAL VALVE PROLAPSE   . Mixed hyperlipidemia    Past Surgical History:  Procedure Laterality Date  . DILATION AND CURETTAGE OF UTERUS    . TONSILLECTOMY     Family History  Problem Relation Age of Onset  . Hypertension Mother   . Heart failure Mother   . Leukemia Father    Social History   Socioeconomic History  . Marital status: Married    Spouse name: Not on file  . Number of children: 1  . Years of education: Not on file  . Highest education level: Not on file  Occupational History  . Not on file  Social Needs  . Financial resource strain: Not hard at all  . Food insecurity:    Worry: Never true    Inability: Never true  . Transportation needs:    Medical: No    Non-medical: No  Tobacco Use  . Smoking status: Former Smoker    Years: 1.00  . Smokeless tobacco: Never Used  . Tobacco comment: social age 63  Substance and Sexual Activity  . Alcohol use: No  . Drug use: No  . Sexual activity: Yes  Lifestyle  . Physical activity:    Days per week: 3 days    Minutes per session: 60 min  . Stress: Not at all  Relationships  . Social connections:  Talks on phone: More than three times a week    Gets together: More than three times a week    Attends religious service: Never    Active member of club or organization: Yes    Attends meetings of clubs or organizations: More than 4 times per year    Relationship status: Married  Other Topics Concern  . Not on file  Social History Narrative  . Not on file    Outpatient Encounter Medications as of 04/18/2018  Medication Sig  . bifidobacterium infantis (ALIGN) capsule Take 1 capsule by mouth daily.  . Biotin (BIOTIN MAXIMUM STRENGTH) 5 MG CAPS Take 1 capsule by mouth.  . cholecalciferol (VITAMIN D) 1000 UNITS tablet Take 2,000 Units by mouth daily.  [name] co-enzyme Q-10 50 MG capsule Take 50 mg by mouth daily.  [name] diltiazem (CARDIZEM CD) 360 MG 24  hr capsule TAKE 1 CAPSULE (360 MG TOTAL) BY MOUTH DAILY.  [name] diltiazem (CARDIZEM) 30 MG tablet TAKE 1 TABLET EVERY 4 HOURS AS NEEDED FOR AFIB HEART RATE >100  . ferrous sulfate 325 (65 FE) MG EC tablet Take 325 mg by mouth daily.  . hydrochlorothiazide (HYDRODIURIL) 25 MG tablet TAKE 1 TABLET BY MOUTH EVERY DAY  . KLOR-CON M10 10 MEQ tablet TAKE 1 TABLET (10 MEQ TOTAL) BY MOUTH 2 (TWO) TIMES DAILY  . lisinopril (PRINIVIL,ZESTRIL) 40 MG tablet Take 1 tablet (40 mg total) by mouth daily.  . pravastatin (PRAVACHOL) 20 MG tablet Take 1 tablet (20 mg total) by mouth daily.  . sodium fluoride (DENTA 5000 PLUS) 1.1 % CREA dental cream   . traMADol (ULTRAM) 50 MG tablet Take 1 tablet (50 mg total) by mouth every 12 (twelve) hours.  . triamcinolone (NASACORT) 55 MCG/ACT AERO nasal inhaler Place 2 sprays into the nose daily.  Alveda Reasons 20 MG TABS tablet TAKE 1 TABLET BY MOUTH EVERY DAY WITH SUPPER  . [DISCONTINUED] potassium chloride (K-DUR) 10 MEQ tablet Take 1 tablet (10 mEq total) by mouth 2 (two) times daily. (Patient not taking: Reported on 04/18/2018)   No facility-administered encounter medications on file as of 04/18/2018.     Activities of Daily Living In your present state of health, do you have any difficulty performing the following activities: 04/18/2018  Hearing? N  Vision? N  Difficulty concentrating or making decisions? N  Walking or climbing stairs? N  Dressing or bathing? N  Doing errands, shopping? N  Preparing Food and eating ? N  Using the Toilet? N  In the past six months, have you accidently leaked urine? N  Do you have problems with loss of bowel control? N  Managing your Medications? N  Managing your Finances? N  Housekeeping or managing your Housekeeping? N  Some recent data might be hidden    Patient Care Team: Binnie Rail, MD as PCP - General (Internal Medicine) Almedia Balls, MD (Orthopedic Surgery) Maisie Fus, MD (Obstetrics and Gynecology) Thompson Grayer,  MD (Cardiology) Erline Levine, MD (Neurosurgery) Stefanie Libel, MD (Sports Medicine) Richmond Campbell, MD (Gastroenterology)    Assessment:   This is a routine wellness examination for [place]. Physical assessment deferred to PCP.   Exercise Activities and Dietary recommendations Current Exercise Habits: Home exercise routine;Structured exercise class, Type of exercise: walking(water aerobics), Time (Minutes): 50, Frequency (Times/Week): 4, Weekly Exercise (Minutes/Week): 200, Intensity: Mild  Diet (meal preparation, eat out, water intake, caffeinated beverages, dairy products, fruits and vegetables): in general, a "healthy" diet  , well balanced   Reviewed  heart healthy and diabetic diet. Encouraged patient to increase daily water and healthy fluid intake. Diet education was attached to patient's AVS.  Goals    . patient     Will explore medical issues so she can get back to exercise as the treadmill.     . Patient Stated     Continue to play golf and stay as physically active as possible. Continue to travel, enjoy life and stay social.       Fall Risk Fall Risk  04/18/2018 05/15/2017 10/11/2016 07/11/2016 03/28/2016  Falls in the past year? No No No No No  Number falls in past yr: - - - - -  Injury with Fall? - - - - -  Risk for fall due to : - - - - -    Depression Screen PHQ 2/9 Scores 04/18/2018 05/15/2017 10/11/2016 07/11/2016  PHQ - 2 Score 0 0 0 0     Cognitive Function MMSE - Mini Mental State Exam 04/18/2018 03/28/2016  Not completed: - (No Data)  Orientation to time 5 -  Orientation to Place 5 -  Registration 3 -  Attention/ Calculation 5 -  Recall 3 -  Language- name 2 objects 2 -  Language- repeat 1 -  Language- follow 3 step command 3 -  Language- read & follow direction 1 -  Write a sentence 1 -  Copy design 1 -  Total score 30 -        Immunization History  Administered Date(s) Administered  . Influenza Split 04/07/2013  . Influenza, High Dose Seasonal  PF 05/16/2016, 05/15/2017, 04/12/2018  . Influenza,inj,Quad PF,6+ Mos 05/03/2015  . Influenza-Unspecified 05/07/2014  . Pneumococcal Conjugate-13 10/14/2014  . Pneumococcal Polysaccharide-23 05/15/2012  . Td 12/05/2005, 08/12/2012  . Zoster 08/07/2006  . Zoster Recombinat (Shingrix) 10/24/2017, 01/04/2018   Screening Tests Health Maintenance  Topic Date Due  . MAMMOGRAM  11/10/2019  . DEXA SCAN  05/24/2022  . TETANUS/TDAP  08/12/2022  . COLONOSCOPY  02/14/2023  . INFLUENZA VACCINE  Completed  . Hepatitis C Screening  Completed  . PNA vac Low Risk Adult  Completed      Plan:      Continue doing brain stimulating activities (puzzles, reading, adult coloring books, staying active) to keep memory sharp.   Continue to eat heart healthy diet (full of fruits, vegetables, whole grains, lean protein, water--limit salt, fat, and sugar intake) and increase physical activity as tolerated.  I have personally reviewed and noted the following in the patient's chart:   . Medical and social history . Use of alcohol, tobacco or illicit drugs  . Current medications and supplements . Functional ability and status . Nutritional status . Physical activity . Advanced directives . List of other physicians . Vitals . Screenings to include cognitive, depression, and falls . Referrals and appointments  In addition, I have reviewed and discussed with patient certain preventive protocols, quality metrics, and best practice recommendations. A written personalized care plan for preventive services as well as general preventive health recommendations were provided to patient.     Michiel Cowboy, RN  04/18/2018    Medical screening examination/treatment/procedure(s) were performed by non-physician practitioner and as supervising physician I was immediately available for consultation/collaboration. I agree with above. Binnie Rail, MD

## 2018-04-18 ENCOUNTER — Ambulatory Visit (INDEPENDENT_AMBULATORY_CARE_PROVIDER_SITE_OTHER): Payer: Medicare Other | Admitting: *Deleted

## 2018-04-18 VITALS — BP 116/62 | HR 62 | Resp 17 | Ht 64.0 in | Wt 163.0 lb

## 2018-04-18 DIAGNOSIS — Z0000 Encounter for general adult medical examination without abnormal findings: Secondary | ICD-10-CM

## 2018-04-18 NOTE — Patient Instructions (Addendum)
Continue doing brain stimulating activities (puzzles, reading, adult coloring books, staying active) to keep memory sharp.   Continue to eat heart healthy diet (full of fruits, vegetables, whole grains, lean protein, water--limit salt, fat, and sugar intake) and increase physical activity as tolerated.   [name] , Thank you for taking time to come for your Medicare Wellness Visit. I appreciate your ongoing commitment to your health goals. Please review the following plan we discussed and let me know if I can assist you in the future.   These are the goals we discussed: Goals    . patient     Will explore medical issues so she can get back to exercise as the treadmill.     . Patient Stated     Continue to play golf and stay as physically active as possible. Continue to travel, enjoy life and stay social.       This is a list of the screening recommended for you and due dates:  Health Maintenance  Topic Date Due  . Mammogram  11/10/2019  . DEXA scan (bone density measurement)  05/24/2022  . Tetanus Vaccine  08/12/2022  . Colon Cancer Screening  02/14/2023  . Flu Shot  Completed  .  Hepatitis C: One time screening is recommended by Center for Disease Control  (CDC) for  adults born from [birth year] through 1964.   Completed  . Pneumonia vaccines  Completed     Diabetes Mellitus and Nutrition When you have diabetes (diabetes mellitus), it is very important to have healthy eating habits because your blood sugar (glucose) levels are greatly affected by what you eat and drink. Eating healthy foods in the appropriate amounts, at about the same times every day, can help you:  Control your blood glucose.  Lower your risk of heart disease.  Improve your blood pressure.  Reach or maintain a healthy weight.  Every person with diabetes is different, and each person has different needs for a meal plan. Your health care provider may recommend that you work with a diet and nutrition specialist  (dietitian) to make a meal plan that is best for you. Your meal plan may vary depending on factors such as:  The calories you need.  The medicines you take.  Your weight.  Your blood glucose, blood pressure, and cholesterol levels.  Your activity level.  Other health conditions you have, such as heart or kidney disease.  How do carbohydrates affect me? Carbohydrates affect your blood glucose level more than any other type of food. Eating carbohydrates naturally increases the amount of glucose in your blood. Carbohydrate counting is a method for keeping track of how many carbohydrates you eat. Counting carbohydrates is important to keep your blood glucose at a healthy level, especially if you use insulin or take certain oral diabetes medicines. It is important to know how many carbohydrates you can safely have in each meal. This is different for every person. Your dietitian can help you calculate how many carbohydrates you should have at each meal and for snack. Foods that contain carbohydrates include:  Bread, cereal, rice, pasta, and crackers.  Potatoes and corn.  Peas, beans, and lentils.  Milk and yogurt.  Fruit and juice.  Desserts, such as cakes, cookies, ice cream, and candy.  How does alcohol affect me? Alcohol can cause a sudden decrease in blood glucose (hypoglycemia), especially if you use insulin or take certain oral diabetes medicines. Hypoglycemia can be a life-threatening condition. Symptoms of hypoglycemia (sleepiness, dizziness, and  confusion) are similar to symptoms of having too much alcohol. If your health care provider says that alcohol is safe for you, follow these guidelines:  Limit alcohol intake to no more than 1 drink per day for nonpregnant women and 2 drinks per day for men. One drink equals 12 oz of beer, 5 oz of wine, or 1 oz of hard liquor.  Do not drink on an empty stomach.  Keep yourself hydrated with water, diet soda, or unsweetened iced  tea.  Keep in mind that regular soda, juice, and other mixers may contain a lot of sugar and must be counted as carbohydrates.  What are tips for following this plan? Reading food labels  Start by checking the serving size on the label. The amount of calories, carbohydrates, fats, and other nutrients listed on the label are based on one serving of the food. Many foods contain more than one serving per package.  Check the total grams (g) of carbohydrates in one serving. You can calculate the number of servings of carbohydrates in one serving by dividing the total carbohydrates by 15. For example, if a food has 30 g of total carbohydrates, it would be equal to 2 servings of carbohydrates.  Check the number of grams (g) of saturated and trans fats in one serving. Choose foods that have low or no amount of these fats.  Check the number of milligrams (mg) of sodium in one serving. Most people should limit total sodium intake to less than 2,300 mg per day.  Always check the nutrition information of foods labeled as "low-fat" or "nonfat". These foods may be higher in added sugar or refined carbohydrates and should be avoided.  Talk to your dietitian to identify your daily goals for nutrients listed on the label. Shopping  Avoid buying canned, premade, or processed foods. These foods tend to be high in fat, sodium, and added sugar.  Shop around the outside edge of the grocery store. This includes fresh fruits and vegetables, bulk grains, fresh meats, and fresh dairy. Cooking  Use low-heat cooking methods, such as baking, instead of high-heat cooking methods like deep frying.  Cook using healthy oils, such as olive, canola, or sunflower oil.  Avoid cooking with butter, cream, or high-fat meats. Meal planning  Eat meals and snacks regularly, preferably at the same times every day. Avoid going long periods of time without eating.  Eat foods high in fiber, such as fresh fruits, vegetables,  beans, and whole grains. Talk to your dietitian about how many servings of carbohydrates you can eat at each meal.  Eat 4-6 ounces of lean protein each day, such as lean meat, chicken, fish, eggs, or tofu. 1 ounce is equal to 1 ounce of meat, chicken, or fish, 1 egg, or 1/4 cup of tofu.  Eat some foods each day that contain healthy fats, such as avocado, nuts, seeds, and fish. Lifestyle   Check your blood glucose regularly.  Exercise at least 30 minutes 5 or more days each week, or as told by your health care provider.  Take medicines as told by your health care provider.  Do not use any products that contain nicotine or tobacco, such as cigarettes and e-cigarettes. If you need help quitting, ask your health care provider.  Work with a Social worker or diabetes educator to identify strategies to manage stress and any emotional and social challenges. What are some questions to ask my health care provider?  Do I need to meet with a diabetes educator?  Do I need to meet with a dietitian?  What number can I call if I have questions?  When are the best times to check my blood glucose? Where to find more information:  American Diabetes Association: diabetes.org/food-and-fitness/food  Academy of Nutrition and Dietetics: PokerClues.dk  [employer] of Diabetes and Digestive and Kidney Diseases (NIH): ContactWire.be Summary  A healthy meal plan will help you control your blood glucose and maintain a healthy lifestyle.  Working with a diet and nutrition specialist (dietitian) can help you make a meal plan that is best for you.  Keep in mind that carbohydrates and alcohol have immediate effects on your blood glucose levels. It is important to count carbohydrates and to use alcohol carefully. This information is not intended to replace advice given to you by your  health care provider. Make sure you discuss any questions you have with your health care provider. Document Released: 04/20/2005 Document Revised: 08/28/2016 Document Reviewed: 08/28/2016 Elsevier Interactive Patient Education  2018 [employer].   Fat and Cholesterol Restricted Diet Getting too much fat and cholesterol in your diet may cause health problems. Following this diet helps keep your fat and cholesterol at normal levels. This can keep you from getting sick. What types of fat should I choose?  Choose monosaturated and polyunsaturated fats. These are found in foods such as olive oil, canola oil, flaxseeds, walnuts, almonds, and seeds.  Eat more omega-3 fats. Good choices include salmon, mackerel, sardines, tuna, flaxseed oil, and ground flaxseeds.  Limit saturated fats. These are in animal products such as meats, butter, and cream. They can also be in plant products such as palm oil, palm kernel oil, and coconut oil.  Avoid foods with partially hydrogenated oils in them. These contain trans fats. Examples of foods that have trans fats are stick margarine, some tub margarines, cookies, crackers, and other baked goods. What general guidelines do I need to follow?  Check food labels. Look for the words "trans fat" and "saturated fat."  When preparing a meal: ? Fill half of your plate with vegetables and green salads. ? Fill one fourth of your plate with whole grains. Look for the word "whole" as the first word in the ingredient list. ? Fill one fourth of your plate with lean protein foods.  Eat more foods that have fiber, like apples, carrots, beans, peas, and barley.  Eat more home-cooked foods. Eat less at restaurants and buffets.  Limit or avoid alcohol.  Limit foods high in starch and sugar.  Limit fried foods.  Cook foods without frying them. Baking, boiling, grilling, and broiling are all great options.  Lose weight if you are overweight. Losing even a small amount  of weight can help your overall health. It can also help prevent diseases such as diabetes and heart disease. What foods can I eat? Grains Whole grains, such as whole wheat or whole grain breads, crackers, cereals, and pasta. Unsweetened oatmeal, bulgur, barley, quinoa, or brown rice. Corn or whole wheat flour tortillas. Vegetables Fresh or frozen vegetables (raw, steamed, roasted, or grilled). Green salads. Fruits All fresh, canned (in natural juice), or frozen fruits. Meat and Other Protein Products Ground beef (85% or leaner), grass-fed beef, or beef trimmed of fat. Skinless chicken or [place]. Ground chicken or [place]. Pork trimmed of fat. All fish and seafood. Eggs. Dried beans, peas, or lentils. Unsalted nuts or seeds. Unsalted canned or dry beans. Dairy Low-fat dairy products, such as skim or 1% milk, 2% or reduced-fat cheeses, low-fat ricotta  or cottage cheese, or plain low-fat yogurt. Fats and Oils Tub margarines without trans fats. Light or reduced-fat mayonnaise and salad dressings. Avocado. Olive, canola, sesame, or safflower oils. Natural peanut or almond butter (choose ones without added sugar and oil). The items listed above may not be a complete list of recommended foods or beverages. Contact your dietitian for more options. What foods are not recommended? Grains White bread. White pasta. White rice. Cornbread. Bagels, pastries, and croissants. Crackers that contain trans fat. Vegetables White potatoes. Corn. Creamed or fried vegetables. Vegetables in a cheese sauce. Fruits Dried fruits. Canned fruit in light or heavy syrup. Fruit juice. Meat and Other Protein Products Fatty cuts of meat. Ribs, chicken wings, bacon, sausage, bologna, salami, chitterlings, fatback, hot dogs, bratwurst, and packaged luncheon meats. Liver and organ meats. Dairy Whole or 2% milk, cream, half-and-half, and cream cheese. Whole milk cheeses. Whole-fat or sweetened yogurt. Full-fat cheeses. Nondairy  creamers and whipped toppings. Processed cheese, cheese spreads, or cheese curds. Sweets and Desserts Corn syrup, sugars, honey, and molasses. Candy. Jam and jelly. Syrup. Sweetened cereals. Cookies, pies, cakes, donuts, muffins, and ice cream. Fats and Oils Butter, stick margarine, lard, shortening, ghee, or bacon fat. Coconut, palm kernel, or palm oils. Beverages Alcohol. Sweetened drinks (such as sodas, lemonade, and fruit drinks or punches). The items listed above may not be a complete list of foods and beverages to avoid. Contact your dietitian for more information. This information is not intended to replace advice given to you by your health care provider. Make sure you discuss any questions you have with your health care provider. Document Released: 01/23/2012 Document Revised: 03/30/2016 Document Reviewed: 10/23/2013 Elsevier Interactive Patient Education  [employer].

## 2018-04-19 ENCOUNTER — Other Ambulatory Visit (HOSPITAL_COMMUNITY): Payer: Self-pay | Admitting: Internal Medicine

## 2018-05-02 ENCOUNTER — Other Ambulatory Visit: Payer: Self-pay | Admitting: Internal Medicine

## 2018-05-03 NOTE — Telephone Encounter (Signed)
Last refill was 01/30/18. Last OV was 04/12/18 Next OV is not on file.

## 2018-05-14 DIAGNOSIS — H6983 Other specified disorders of Eustachian tube, bilateral: Secondary | ICD-10-CM | POA: Diagnosis not present

## 2018-05-14 DIAGNOSIS — H903 Sensorineural hearing loss, bilateral: Secondary | ICD-10-CM | POA: Diagnosis not present

## 2018-05-14 DIAGNOSIS — H9113 Presbycusis, bilateral: Secondary | ICD-10-CM | POA: Insufficient documentation

## 2018-05-14 DIAGNOSIS — H6980 Other specified disorders of Eustachian tube, unspecified ear: Secondary | ICD-10-CM | POA: Diagnosis not present

## 2018-05-21 ENCOUNTER — Other Ambulatory Visit (HOSPITAL_COMMUNITY): Payer: Self-pay | Admitting: Nurse Practitioner

## 2018-05-21 DIAGNOSIS — Z6828 Body mass index (BMI) 28.0-28.9, adult: Secondary | ICD-10-CM | POA: Diagnosis not present

## 2018-05-21 DIAGNOSIS — Z01419 Encounter for gynecological examination (general) (routine) without abnormal findings: Secondary | ICD-10-CM | POA: Diagnosis not present

## 2018-06-14 DIAGNOSIS — M25512 Pain in left shoulder: Secondary | ICD-10-CM | POA: Diagnosis not present

## 2018-06-20 ENCOUNTER — Encounter (HOSPITAL_COMMUNITY): Payer: Self-pay

## 2018-06-24 ENCOUNTER — Ambulatory Visit (HOSPITAL_COMMUNITY)
Admission: RE | Admit: 2018-06-24 | Discharge: 2018-06-24 | Disposition: A | Discharge status: Home / Self Care | Payer: Medicare Other | Source: Ambulatory Visit | Attending: Nurse Practitioner | Admitting: Nurse Practitioner

## 2018-06-24 ENCOUNTER — Encounter (HOSPITAL_COMMUNITY): Payer: Self-pay | Admitting: Nurse Practitioner

## 2018-06-24 VITALS — BP 134/72 | HR 95 | Ht 64.0 in | Wt 164.0 lb

## 2018-06-24 DIAGNOSIS — I10 Essential (primary) hypertension: Secondary | ICD-10-CM | POA: Diagnosis not present

## 2018-06-24 DIAGNOSIS — M7060 Trochanteric bursitis, unspecified hip: Secondary | ICD-10-CM | POA: Diagnosis not present

## 2018-06-24 DIAGNOSIS — X58XXXA Exposure to other specified factors, initial encounter: Secondary | ICD-10-CM | POA: Diagnosis not present

## 2018-06-24 DIAGNOSIS — E782 Mixed hyperlipidemia: Secondary | ICD-10-CM | POA: Diagnosis not present

## 2018-06-24 DIAGNOSIS — Z8249 Family history of ischemic heart disease and other diseases of the circulatory system: Secondary | ICD-10-CM | POA: Diagnosis not present

## 2018-06-24 DIAGNOSIS — I341 Nonrheumatic mitral (valve) prolapse: Secondary | ICD-10-CM | POA: Diagnosis not present

## 2018-06-24 DIAGNOSIS — Z7901 Long term (current) use of anticoagulants: Secondary | ICD-10-CM | POA: Insufficient documentation

## 2018-06-24 DIAGNOSIS — M160 Bilateral primary osteoarthritis of hip: Secondary | ICD-10-CM | POA: Insufficient documentation

## 2018-06-24 DIAGNOSIS — Z87891 Personal history of nicotine dependence: Secondary | ICD-10-CM | POA: Diagnosis not present

## 2018-06-24 DIAGNOSIS — I4821 Permanent atrial fibrillation: Secondary | ICD-10-CM | POA: Diagnosis not present

## 2018-06-24 DIAGNOSIS — S5012XA Contusion of left forearm, initial encounter: Secondary | ICD-10-CM | POA: Diagnosis not present

## 2018-06-24 DIAGNOSIS — Z79899 Other long term (current) drug therapy: Secondary | ICD-10-CM | POA: Insufficient documentation

## 2018-06-24 NOTE — Progress Notes (Signed)
Primary Care Physician: Binnie Rail, MD Referring Physician: Same   [name] is a 72 y.o. female with a h/o long standing permanent afib, anticoagulation with  xarelto for stroke prevention. I have not seen her since 2017. She is in the afib clinic for a bruise on her inner  upper  forearm that was noticeable 1-2 days after a cortisone shot that she got for left shoulder pain. The bruise has now stopped enlarging x several days, is starting to look like an old bruise, and there is no hematoma. The upper arm is soft, no swelling and pulses intact. It is not painful, she was just concerned and wanted to have it checked.She held xarelto one night when the bruising first appeared.  Today, she denies symptoms of palpitations, chest pain, shortness of breath, orthopnea, PND, lower extremity edema, dizziness, presyncope, syncope, or neurologic sequela. The patient is tolerating medications without difficulties and is otherwise without complaint today.   Past Medical History:  Diagnosis Date  . ARTHRITIS, HIPS, BILATERAL   . Atrial fibrillation (HCC)    chronic anticoag - Pradaxa  . BURSITIS, SUBTROCHANTERIC   . HYPERTENSION   . MITRAL VALVE PROLAPSE   . Mixed hyperlipidemia    Past Surgical History:  Procedure Laterality Date  . DILATION AND CURETTAGE OF UTERUS    . TONSILLECTOMY      Current Outpatient Medications  Medication Sig Dispense Refill  . bifidobacterium infantis (ALIGN) capsule Take 1 capsule by mouth daily. 30 capsule 11  . Biotin (BIOTIN MAXIMUM STRENGTH) 5 MG CAPS Take 1 capsule by mouth.    . cholecalciferol (VITAMIN D) 1000 UNITS tablet Take 2,000 Units by mouth daily.    [name] co-enzyme Q-10 50 MG capsule Take 50 mg by mouth daily.    [name] diltiazem (CARDIZEM CD) 360 MG 24 hr capsule Take 1 capsule (360 mg total) by mouth daily. -- Office visit needed for further refills 90 capsule 0  . diltiazem (CARDIZEM) 30 MG tablet TAKE 1 TABLET EVERY 4 HOURS AS NEEDED FOR AFIB  HEART RATE >100 45 tablet 1  . ferrous sulfate 325 (65 FE) MG EC tablet Take 325 mg by mouth daily.    . hydrochlorothiazide (HYDRODIURIL) 25 MG tablet TAKE 1 TABLET BY MOUTH EVERY DAY 90 tablet 3  . KLOR-CON M10 10 MEQ tablet TAKE 1 TABLET (10 MEQ TOTAL) BY MOUTH 2 (TWO) TIMES DAILY  3  . lisinopril (PRINIVIL,ZESTRIL) 40 MG tablet Take 1 tablet (40 mg total) by mouth daily. 90 tablet 3  . pravastatin (PRAVACHOL) 20 MG tablet Take 1 tablet (20 mg total) by mouth daily. 90 tablet 3  . sodium fluoride (DENTA 5000 PLUS) 1.1 % CREA dental cream     . traMADol (ULTRAM) 50 MG tablet TAKE 1 TABLET EVERY 12 HOURS (01/30/18) 180 tablet 0  . XARELTO 20 MG TABS tablet TAKE 1 TABLET EVERY DAY WITH SUPPER 90 tablet 0  . triamcinolone (NASACORT) 55 MCG/ACT AERO nasal inhaler Place 2 sprays into the nose daily. (Patient not taking: Reported on 06/24/2018) 1 Inhaler 12   No current facility-administered medications for this encounter.     Allergies  Allergen Reactions  . Nickel Rash    Social History   Socioeconomic History  . Marital status: Married    Spouse name: Not on file  . Number of children: 1  . Years of education: Not on file  . Highest education level: Not on file  Occupational History  . Not on  file  Social Needs  . Financial resource strain: Not hard at all  . Food insecurity:    Worry: Never true    Inability: Never true  . Transportation needs:    Medical: No    Non-medical: No  Tobacco Use  . Smoking status: Former Smoker    Years: 1.00  . Smokeless tobacco: Never Used  . Tobacco comment: social age 25  Substance and Sexual Activity  . Alcohol use: No  . Drug use: No  . Sexual activity: Yes  Lifestyle  . Physical activity:    Days per week: 3 days    Minutes per session: 60 min  . Stress: Not at all  Relationships  . Social connections:    Talks on phone: More than three times a week    Gets together: More than three times a week    Attends religious service:  Never    Active member of club or organization: Yes    Attends meetings of clubs or organizations: More than 4 times per year    Relationship status: Married  . Intimate partner violence:    Fear of current or ex partner: No    Emotionally abused: No    Physically abused: No    Forced sexual activity: No  Other Topics Concern  . Not on file  Social History Narrative  . Not on file    Family History  Problem Relation Age of Onset  . Hypertension Mother   . Heart failure Mother   . Leukemia Father     ROS- All systems are reviewed and negative except as per the HPI above  Physical Exam: Vitals:   06/24/18 1455  BP: 134/72  Pulse: 95  Weight: 74.4 kg  Height: 5\' 4"  (1.626 m)   Wt Readings from Last 3 Encounters:  06/24/18 74.4 kg  04/18/18 73.9 kg  04/12/18 73.5 kg    Labs: Lab Results  Component Value Date   NA 138 11/06/2017   K 3.8 11/06/2017   CL 101 11/06/2017   CO2 28 11/06/2017   GLUCOSE 84 11/06/2017   BUN 23 11/06/2017   CREATININE 0.69 11/06/2017   CALCIUM 9.3 11/06/2017   MG 2.0 06/06/2016   Lab Results  Component Value Date   INR 1.4 01/25/2010   Lab Results  Component Value Date   CHOL 210 (H) 11/06/2017   HDL 82.90 11/06/2017   LDLCALC 105 (H) 11/06/2017   TRIG 107.0 11/06/2017     GEN- The patient is well appearing, alert and oriented x 3 today.   Head- normocephalic, atraumatic Eyes-  Sclera clear, conjunctiva pink Ears- hearing intact Oropharynx- clear Neck- supple, no JVP Lymph- no cervical lymphadenopathy Lungs- Clear to ausculation bilaterally, normal work of breathing Heart-irregular rate and rhythm, no murmurs, rubs or gallops, PMI not laterally displaced GI- soft, NT, ND, + BS Extremities- no clubbing, cyanosis, or edema MS- no significant deformity or atrophy Skin- no rash or lesion Psych- euthymic mood, full affect Neuro- strength and sensation are intact  EKG-afib at 95 bpm, qrs int 74 ms, qtc 454  ms    Assessment and Plan: 1. Longstanding permanent afib Rate controlled and not symptomatic  Stable  2. Bruising left  inner forearm Occurred 1-2 days after a cortisone shot to her left shoulder It is soft, old appearing, pulses intact, pt states it has stopped  enlarging last week, not painful I feel this is related to her shoulder injury and cortisone shot Pt reassured that  it should resolve without  Intervention She is on correct dose of xarelto with crcl cal over 80 based on creatinine 6 months ago  afib clinic as needed  Butch Penny C. Jamaurie Bernier, [facility] [address] [place], [place] [address] [phone]

## 2018-07-01 ENCOUNTER — Other Ambulatory Visit: Payer: Self-pay | Admitting: Sports Medicine

## 2018-07-01 DIAGNOSIS — D225 Melanocytic nevi of trunk: Secondary | ICD-10-CM | POA: Diagnosis not present

## 2018-07-01 DIAGNOSIS — M25512 Pain in left shoulder: Secondary | ICD-10-CM | POA: Diagnosis not present

## 2018-07-01 DIAGNOSIS — Z86018 Personal history of other benign neoplasm: Secondary | ICD-10-CM | POA: Diagnosis not present

## 2018-07-01 DIAGNOSIS — D2230 Melanocytic nevi of unspecified part of face: Secondary | ICD-10-CM | POA: Diagnosis not present

## 2018-07-01 DIAGNOSIS — Z23 Encounter for immunization: Secondary | ICD-10-CM | POA: Diagnosis not present

## 2018-07-01 DIAGNOSIS — L639 Alopecia areata, unspecified: Secondary | ICD-10-CM | POA: Diagnosis not present

## 2018-07-08 ENCOUNTER — Other Ambulatory Visit: Payer: Self-pay | Admitting: Internal Medicine

## 2018-07-10 ENCOUNTER — Ambulatory Visit
Admission: RE | Admit: 2018-07-10 | Discharge: 2018-07-10 | Disposition: A | Discharge status: Home / Self Care | Payer: Medicare Other | Source: Ambulatory Visit | Attending: Sports Medicine | Admitting: Sports Medicine

## 2018-07-10 DIAGNOSIS — M75122 Complete rotator cuff tear or rupture of left shoulder, not specified as traumatic: Secondary | ICD-10-CM | POA: Diagnosis not present

## 2018-07-10 DIAGNOSIS — M25512 Pain in left shoulder: Secondary | ICD-10-CM

## 2018-07-11 NOTE — Progress Notes (Signed)
Subjective:    Patient ID: [name], female    DOB: November 21, 1945, 72 y.o.   MRN: [id number]  HPI The patient is here for follow up.  Atrial fibrillation, hypertension: She is taking her medication daily. She is compliant with a low sodium diet.  She denies chest pain, palpitations, edema, shortness of breath and regular headaches. She is exercising regularly.  She does not monitor her blood pressure at home.    Hyperlipidemia: She is taking her medication daily. She is compliant with a low fat/cholesterol diet. She is exercising regularly - gym 3/week. She denies myalgias.   Prediabetes:  She is not always compliant with a low sugar/carbohydrate diet.  She is exercising regularly.  Osteoarthritis-bilateral hips, feet, hip bursitis: She takes tramadol with Tylenol twice a day.  She denies side effects.  Medication works fairly well.  Medications and allergies reviewed with patient and updated if appropriate.  Patient Active Problem List   Diagnosis Date Noted  . ETD (Eustachian tube dysfunction), bilateral 04/12/2018  . Prediabetes 05/15/2017  . Pain of right heel 01/09/2017  . Callous ulcer ([place]) 01/09/2017  . Primary osteoarthritis of both feet 07/10/2016  . Primary osteoarthritis of both hands 07/10/2016  . Alopecia totalis 06/05/2016  . Dermatochalasis of both upper eyelids 10/12/2015  . H/O dysplastic nevus 08/03/2015  . Obese 10/14/2014  . Neck muscle spasm 05/22/2014  . Degenerative lumbar disc 12/04/2013  . Hematuria, undiagnosed cause 09/02/2013  . Osteopenia 06/11/2013  . Primary generalized (osteo)arthritis 12/18/2011  . Mixed hyperlipidemia 03/25/2009  . Essential hypertension 03/24/2009  . MITRAL VALVE PROLAPSE 03/24/2009  . ATRIAL FIBRILLATION 03/24/2009  . COCCYGEAL PAIN 02/12/2007  . Greater trochanteric bursitis of left hip 02/12/2007    Current Outpatient Medications on File Prior to Visit  Medication Sig Dispense Refill  . bifidobacterium  infantis (ALIGN) capsule Take 1 capsule by mouth daily. 30 capsule 11  . cholecalciferol (VITAMIN D) 1000 UNITS tablet Take 2,000 Units by mouth daily.    . clobetasol cream (TEMOVATE) 4.62 % Apply 1 application topically 2 (two) times daily.    [name] co-enzyme Q-10 50 MG capsule Take 50 mg by mouth daily.    [name] diltiazem (CARDIZEM CD) 360 MG 24 hr capsule Take 1 capsule (360 mg total) by mouth daily. -- Office visit needed for further refills 90 capsule 0  . diltiazem (CARDIZEM) 30 MG tablet TAKE 1 TABLET EVERY 4 HOURS AS NEEDED FOR AFIB HEART RATE >100 45 tablet 1  . ferrous sulfate 325 (65 FE) MG EC tablet Take 325 mg by mouth daily.    . hydrochlorothiazide (HYDRODIURIL) 25 MG tablet TAKE 1 TABLET BY MOUTH EVERY DAY 90 tablet 3  . KLOR-CON M10 10 MEQ tablet TAKE 1 TABLET (10 MEQ TOTAL) BY MOUTH 2 (TWO) TIMES DAILY  3  . lisinopril (PRINIVIL,ZESTRIL) 40 MG tablet TAKE 1 TABLET BY MOUTH EVERY DAY 90 tablet 1  . pravastatin (PRAVACHOL) 20 MG tablet Take 1 tablet (20 mg total) by mouth daily. 90 tablet 3  . traMADol (ULTRAM) 50 MG tablet TAKE 1 TABLET EVERY 12 HOURS (01/30/18) 180 tablet 0  . XARELTO 20 MG TABS tablet TAKE 1 TABLET EVERY DAY WITH SUPPER 90 tablet 0   No current facility-administered medications on file prior to visit.     Past Medical History:  Diagnosis Date  . ARTHRITIS, HIPS, BILATERAL   . Atrial fibrillation (HCC)    chronic anticoag - Pradaxa  . BURSITIS, SUBTROCHANTERIC   .  HYPERTENSION   . MITRAL VALVE PROLAPSE   . Mixed hyperlipidemia     Past Surgical History:  Procedure Laterality Date  . DILATION AND CURETTAGE OF UTERUS    . TONSILLECTOMY      Social History   Socioeconomic History  . Marital status: Married    Spouse name: Not on file  . Number of children: 1  . Years of education: Not on file  . Highest education level: Not on file  Occupational History  . Not on file  Social Needs  . Financial resource strain: Not hard at all  . Food insecurity:      Worry: Never true    Inability: Never true  . Transportation needs:    Medical: No    Non-medical: No  Tobacco Use  . Smoking status: Former Smoker    Years: 1.00  . Smokeless tobacco: Never Used  . Tobacco comment: social age 52  Substance and Sexual Activity  . Alcohol use: No  . Drug use: No  . Sexual activity: Yes  Lifestyle  . Physical activity:    Days per week: 3 days    Minutes per session: 60 min  . Stress: Not at all  Relationships  . Social connections:    Talks on phone: More than three times a week    Gets together: More than three times a week    Attends religious service: Never    Active member of club or organization: Yes    Attends meetings of clubs or organizations: More than 4 times per year    Relationship status: Married  Other Topics Concern  . Not on file  Social History Narrative  . Not on file    Family History  Problem Relation Age of Onset  . Hypertension Mother   . Heart failure Mother   . Leukemia Father     Review of Systems  Constitutional: Negative for chills and fever.  Respiratory: Negative for cough, shortness of breath and wheezing.   Cardiovascular: Positive for chest pain. Negative for palpitations and leg swelling.  Neurological: Negative for light-headedness and headaches.       Objective:   Vitals:   07/12/18 1408  BP: (!) 118/58  Pulse: 80  Resp: 16  Temp: 97.9 F (36.6 C)  SpO2: 98%   BP Readings from Last 3 Encounters:  07/12/18 (!) 118/58  06/24/18 134/72  04/18/18 116/62   Wt Readings from Last 3 Encounters:  07/12/18 164 lb 12.8 oz (74.8 kg)  06/24/18 164 lb (74.4 kg)  04/18/18 163 lb (73.9 kg)   Body mass index is 28.29 kg/m.   Physical Exam    Constitutional: Appears well-developed and well-nourished. No distress.  HENT:  Head: Normocephalic and atraumatic.  Neck: Neck supple. No tracheal deviation present. No thyromegaly present.  No cervical lymphadenopathy Cardiovascular: Normal rate,  regular rhythm and normal heart sounds.   No murmur heard. No carotid bruit .  No edema Pulmonary/Chest: Effort normal and breath sounds normal. No respiratory distress. No has no wheezes. No rales.  Skin: Skin is warm and dry. Not diaphoretic.  Psychiatric: Normal mood and affect. Behavior is normal.      Assessment & Plan:    See Problem List for Assessment and Plan of chronic medical problems.

## 2018-07-11 NOTE — Patient Instructions (Addendum)

## 2018-07-12 ENCOUNTER — Ambulatory Visit (INDEPENDENT_AMBULATORY_CARE_PROVIDER_SITE_OTHER): Payer: Medicare Other | Admitting: Internal Medicine

## 2018-07-12 ENCOUNTER — Encounter: Payer: Self-pay | Admitting: Internal Medicine

## 2018-07-12 ENCOUNTER — Other Ambulatory Visit (INDEPENDENT_AMBULATORY_CARE_PROVIDER_SITE_OTHER): Payer: Medicare Other

## 2018-07-12 VITALS — BP 118/58 | HR 80 | Temp 97.9°F | Resp 16 | Ht 64.0 in | Wt 164.8 lb

## 2018-07-12 DIAGNOSIS — E782 Mixed hyperlipidemia: Secondary | ICD-10-CM

## 2018-07-12 DIAGNOSIS — R7303 Prediabetes: Secondary | ICD-10-CM | POA: Diagnosis not present

## 2018-07-12 DIAGNOSIS — M150 Primary generalized (osteo)arthritis: Secondary | ICD-10-CM | POA: Diagnosis not present

## 2018-07-12 DIAGNOSIS — I10 Essential (primary) hypertension: Secondary | ICD-10-CM

## 2018-07-12 DIAGNOSIS — I4821 Permanent atrial fibrillation: Secondary | ICD-10-CM

## 2018-07-12 LAB — CBC WITH DIFFERENTIAL/PLATELET
BASOS ABS: 0.1 10*3/uL (ref 0.0–0.1)
Basophils Relative: 0.8 % (ref 0.0–3.0)
Eosinophils Absolute: 0.1 10*3/uL (ref 0.0–0.7)
Eosinophils Relative: 1 % (ref 0.0–5.0)
HCT: 44.2 % (ref 36.0–46.0)
Hemoglobin: 15 g/dL (ref 12.0–15.0)
Lymphocytes Relative: 37.6 % (ref 12.0–46.0)
Lymphs Abs: 2.8 10*3/uL (ref 0.7–4.0)
MCHC: 34 g/dL (ref 30.0–36.0)
MCV: 97.3 fl (ref 78.0–100.0)
Monocytes Absolute: 0.6 10*3/uL (ref 0.1–1.0)
Monocytes Relative: 8.1 % (ref 3.0–12.0)
Neutro Abs: 3.8 10*3/uL (ref 1.4–7.7)
Neutrophils Relative %: 52.5 % (ref 43.0–77.0)
Platelets: 245 10*3/uL (ref 150.0–400.0)
RBC: 4.54 Mil/uL (ref 3.87–5.11)
RDW: 13.1 % (ref 11.5–15.5)
WBC: 7.3 10*3/uL (ref 4.0–10.5)

## 2018-07-12 LAB — COMPREHENSIVE METABOLIC PANEL
ALK PHOS: 83 U/L (ref 39–117)
ALT: 11 U/L (ref 0–35)
AST: 16 U/L (ref 0–37)
Albumin: 4.5 g/dL (ref 3.5–5.2)
BUN: 23 mg/dL (ref 6–23)
CO2: 30 mEq/L (ref 19–32)
Calcium: 9.5 mg/dL (ref 8.4–10.5)
Chloride: 102 mEq/L (ref 96–112)
Creatinine, Ser: 0.8 mg/dL (ref 0.40–1.20)
GFR: 75.08 mL/min (ref >60.00)
Glucose, Bld: 85 mg/dL (ref 70–99)
Potassium: 3.2 mEq/L — ABNORMAL LOW (ref 3.5–5.1)
Sodium: 141 mEq/L (ref 135–145)
TOTAL PROTEIN: 7 g/dL (ref 6.0–8.3)
Total Bilirubin: 0.3 mg/dL (ref 0.2–1.2)

## 2018-07-12 LAB — LIPID PANEL
Cholesterol: 214 mg/dL — ABNORMAL HIGH (ref 0–200)
HDL: 68 mg/dL (ref >39.00)
NONHDL: 145.96
Total CHOL/HDL Ratio: 3
Triglycerides: 308 mg/dL — ABNORMAL HIGH (ref 0.0–149.0)
VLDL: 61.6 mg/dL — ABNORMAL HIGH (ref 0.0–40.0)

## 2018-07-12 LAB — LDL CHOLESTEROL, DIRECT: Direct LDL: 114 mg/dL

## 2018-07-12 LAB — HEMOGLOBIN A1C: HEMOGLOBIN A1C: 5.6 % (ref 4.6–6.5)

## 2018-07-12 MED ORDER — DILTIAZEM HCL ER COATED BEADS 360 MG PO CP24: 360.0000 mg | ORAL_CAPSULE | Freq: Every day | ORAL | 1 refills | Status: DC

## 2018-07-12 MED ORDER — PRAVASTATIN SODIUM 20 MG PO TABS: 20.0000 mg | ORAL_TABLET | Freq: Every day | ORAL | 1 refills | Status: DC

## 2018-07-12 MED ORDER — RIVAROXABAN 20 MG PO TABS: ORAL_TABLET | ORAL | 1 refills | Status: DC

## 2018-07-12 NOTE — Assessment & Plan Note (Signed)
Following with cardiology Asymptomatic Rate controlled Check cbc

## 2018-07-12 NOTE — Assessment & Plan Note (Signed)
Check lipid panel  Continue daily statin Regular exercise and healthy diet encouraged  

## 2018-07-12 NOTE — Assessment & Plan Note (Signed)
BP well controlled Current regimen effective and well tolerated Continue current medications at current doses cmp  

## 2018-07-12 NOTE — Assessment & Plan Note (Signed)
Check a1c Low sugar / carb diet Stressed regular exercise   

## 2018-07-12 NOTE — Assessment & Plan Note (Signed)
Taking tramadol and tylenol Controlled, stable Continue tramadol and tylenol

## 2018-07-14 ENCOUNTER — Encounter: Payer: Self-pay | Admitting: Internal Medicine

## 2018-07-16 ENCOUNTER — Other Ambulatory Visit: Payer: Self-pay | Admitting: Internal Medicine

## 2018-07-16 MED ORDER — KLOR-CON M10 10 MEQ PO TBCR: 30.0000 meq | EXTENDED_RELEASE_TABLET | Freq: Every day | ORAL | 3 refills | Status: DC

## 2018-07-18 ENCOUNTER — Other Ambulatory Visit: Payer: Self-pay | Admitting: Internal Medicine

## 2018-07-20 DIAGNOSIS — M25612 Stiffness of left shoulder, not elsewhere classified: Secondary | ICD-10-CM | POA: Diagnosis not present

## 2018-07-20 DIAGNOSIS — M25512 Pain in left shoulder: Secondary | ICD-10-CM | POA: Diagnosis not present

## 2018-07-20 DIAGNOSIS — M6281 Muscle weakness (generalized): Secondary | ICD-10-CM | POA: Diagnosis not present

## 2018-07-20 DIAGNOSIS — S46012D Strain of muscle(s) and tendon(s) of the rotator cuff of left shoulder, subsequent encounter: Secondary | ICD-10-CM | POA: Diagnosis not present

## 2018-07-22 DIAGNOSIS — Z23 Encounter for immunization: Secondary | ICD-10-CM | POA: Diagnosis not present

## 2018-07-22 DIAGNOSIS — L639 Alopecia areata, unspecified: Secondary | ICD-10-CM | POA: Diagnosis not present

## 2018-07-26 DIAGNOSIS — M6281 Muscle weakness (generalized): Secondary | ICD-10-CM | POA: Diagnosis not present

## 2018-07-26 DIAGNOSIS — M25612 Stiffness of left shoulder, not elsewhere classified: Secondary | ICD-10-CM | POA: Diagnosis not present

## 2018-07-26 DIAGNOSIS — M25512 Pain in left shoulder: Secondary | ICD-10-CM | POA: Diagnosis not present

## 2018-07-26 DIAGNOSIS — S46012D Strain of muscle(s) and tendon(s) of the rotator cuff of left shoulder, subsequent encounter: Secondary | ICD-10-CM | POA: Diagnosis not present

## 2018-08-03 ENCOUNTER — Other Ambulatory Visit: Payer: Self-pay | Admitting: Internal Medicine

## 2018-08-05 NOTE — Telephone Encounter (Signed)
Last refill was 05/03/18 Last OV 07/12/18 Next OV 11/12/18

## 2018-08-09 ENCOUNTER — Other Ambulatory Visit (HOSPITAL_COMMUNITY): Payer: Self-pay | Admitting: *Deleted

## 2018-08-09 MED ORDER — RIVAROXABAN 20 MG PO TABS: ORAL_TABLET | ORAL | 2 refills | Status: DC

## 2018-09-17 ENCOUNTER — Other Ambulatory Visit: Payer: Self-pay | Admitting: Internal Medicine

## 2018-10-11 DIAGNOSIS — H6983 Other specified disorders of Eustachian tube, bilateral: Secondary | ICD-10-CM | POA: Diagnosis not present

## 2018-10-22 ENCOUNTER — Other Ambulatory Visit: Payer: Self-pay | Admitting: Internal Medicine

## 2018-10-22 NOTE — Telephone Encounter (Signed)
Last refill was 08/05/18 Last OV 07/12/18

## 2018-10-23 ENCOUNTER — Other Ambulatory Visit: Payer: Self-pay | Admitting: Internal Medicine

## 2018-11-11 NOTE — Progress Notes (Signed)
Virtual Visit via Video Note  I connected with [name] on 11/11/18 at  2:00 PM EDT by a video enabled telemedicine application and verified that I am speaking with the correct person using two identifiers.   I discussed the limitations of evaluation and management by telemedicine and the availability of in person appointments. The patient expressed understanding and agreed to proceed.  The patient is currently at home and I am in the office.    No referring provider.    History of Present Illness: She is here for follow up of her chronic medical conditions.   She is exercising some-doing a little walking, but states she should do more.    Afib, Hypertension: She is taking her medication daily. She is compliant with a low sodium diet.  She does not monitor her blood pressure at home, but can monitor it at home.    Hyperlipidemia: She is taking her medication daily. She is compliant with a low fat/cholesterol diet. She denies myalgias.   Prediabetes:  She is less compliant with a low sugar/carbohydrate diet.  She is exercising.  Osteoarthritis (b/l hips, feet, hip bursitis:  She is taking tramdol with tylenol BID.  She denies side effects. The medication works fairly well and her pain is controlled.   Review of Systems  Constitutional: Negative for chills and fever.  Respiratory: Negative for cough, shortness of breath and wheezing.   Cardiovascular: Negative for chest pain, palpitations and leg swelling.  Gastrointestinal: Negative for heartburn and nausea.  Neurological: Positive for dizziness (3 weeks ago). Negative for headaches.      Observations/Objective: Appears well in NAD.      Assessment and Plan:  See Problem List for Assessment and Plan of chronic medical problems.   Follow Up Instructions:    I discussed the assessment and treatment plan with the patient. The patient was provided an opportunity to ask questions and all were answered. The patient  agreed with the plan and demonstrated an understanding of the instructions.   The patient was advised to call back or seek an in-person evaluation if the symptoms worsen or if the condition fails to improve as anticipated.  Follow up in 6 months   Binnie Rail, MD

## 2018-11-12 ENCOUNTER — Encounter: Payer: Self-pay | Admitting: Internal Medicine

## 2018-11-12 ENCOUNTER — Ambulatory Visit (INDEPENDENT_AMBULATORY_CARE_PROVIDER_SITE_OTHER): Payer: Medicare Other | Admitting: Internal Medicine

## 2018-11-12 DIAGNOSIS — I10 Essential (primary) hypertension: Secondary | ICD-10-CM | POA: Diagnosis not present

## 2018-11-12 DIAGNOSIS — I4821 Permanent atrial fibrillation: Secondary | ICD-10-CM

## 2018-11-12 DIAGNOSIS — M150 Primary generalized (osteo)arthritis: Secondary | ICD-10-CM | POA: Diagnosis not present

## 2018-11-12 DIAGNOSIS — E782 Mixed hyperlipidemia: Secondary | ICD-10-CM | POA: Diagnosis not present

## 2018-11-12 DIAGNOSIS — R7303 Prediabetes: Secondary | ICD-10-CM | POA: Diagnosis not present

## 2018-11-12 NOTE — Assessment & Plan Note (Signed)
BP Readings from Last 3 Encounters:  07/12/18 (!) 118/58  06/24/18 134/72  04/18/18 116/62   Blood pressure typically well controlled here She does have the ability to monitor it at home and will start checking it on occasion Continue current medication

## 2018-11-12 NOTE — Assessment & Plan Note (Signed)
No symptoms consistent with atrial fibrillation Continue current medications On Cardizem, Xarelto We will hold off on blood work for 6 months

## 2018-11-12 NOTE — Assessment & Plan Note (Signed)
Pain in multiple joints Taking tramadol and Tylenol at the same time Overall pain is controlled Continue

## 2018-11-12 NOTE — Assessment & Plan Note (Signed)
Sugars have been well controlled Advised to be more compliant with a low sugar/carb diet to make sure she is walking regularly Will recheck A1c in 6 months

## 2018-11-12 NOTE — Assessment & Plan Note (Signed)
Continue statin. 

## 2019-01-04 ENCOUNTER — Other Ambulatory Visit: Payer: Self-pay | Admitting: Internal Medicine

## 2019-01-13 ENCOUNTER — Other Ambulatory Visit: Payer: Self-pay | Admitting: Obstetrics & Gynecology

## 2019-01-13 DIAGNOSIS — Z1231 Encounter for screening mammogram for malignant neoplasm of breast: Secondary | ICD-10-CM

## 2019-01-27 ENCOUNTER — Telehealth (HOSPITAL_COMMUNITY): Payer: Self-pay | Admitting: *Deleted

## 2019-01-27 MED ORDER — APIXABAN 5 MG PO TABS: 5.0000 mg | ORAL_TABLET | Freq: Two times a day (BID) | ORAL | 2 refills | Status: DC

## 2019-01-27 NOTE — Telephone Encounter (Signed)
Patient would like to try switching to eliquis from xarelto due to price change in her insurance coverage. Discussed with Roderic Palau NP and pt will be on eliquis 5mg  twice a day. Instructions reviewed with pt

## 2019-01-31 ENCOUNTER — Other Ambulatory Visit: Payer: Self-pay | Admitting: Internal Medicine

## 2019-01-31 NOTE — Telephone Encounter (Signed)
Done erx 

## 2019-01-31 NOTE — Telephone Encounter (Signed)
Check  registry last filled 11/02/2018. MD is out of the office pls advise.Marland KitchenJohny [name]

## 2019-02-11 DIAGNOSIS — H52203 Unspecified astigmatism, bilateral: Secondary | ICD-10-CM | POA: Diagnosis not present

## 2019-02-11 DIAGNOSIS — H524 Presbyopia: Secondary | ICD-10-CM | POA: Diagnosis not present

## 2019-02-11 DIAGNOSIS — Z961 Presence of intraocular lens: Secondary | ICD-10-CM | POA: Diagnosis not present

## 2019-02-28 ENCOUNTER — Ambulatory Visit
Admission: RE | Admit: 2019-02-28 | Discharge: 2019-02-28 | Disposition: A | Discharge status: Home / Self Care | Payer: Medicare Other | Source: Ambulatory Visit | Attending: Obstetrics & Gynecology | Admitting: Obstetrics & Gynecology

## 2019-02-28 ENCOUNTER — Other Ambulatory Visit: Payer: Self-pay

## 2019-02-28 DIAGNOSIS — Z1231 Encounter for screening mammogram for malignant neoplasm of breast: Secondary | ICD-10-CM | POA: Diagnosis not present

## 2019-03-20 ENCOUNTER — Encounter: Payer: Self-pay | Admitting: Internal Medicine

## 2019-03-20 DIAGNOSIS — Z1159 Encounter for screening for other viral diseases: Secondary | ICD-10-CM

## 2019-03-21 ENCOUNTER — Other Ambulatory Visit: Payer: Medicare Other

## 2019-03-21 ENCOUNTER — Other Ambulatory Visit: Payer: Self-pay

## 2019-03-21 DIAGNOSIS — Z1159 Encounter for screening for other viral diseases: Secondary | ICD-10-CM | POA: Diagnosis not present

## 2019-03-22 LAB — SAR COV2 SEROLOGY (COVID19)AB(IGG),IA: SARS CoV2 AB IGG: NEGATIVE

## 2019-03-23 ENCOUNTER — Encounter: Payer: Self-pay | Admitting: Internal Medicine

## 2019-04-02 ENCOUNTER — Telehealth (INDEPENDENT_AMBULATORY_CARE_PROVIDER_SITE_OTHER): Payer: Medicare Other | Admitting: Internal Medicine

## 2019-04-02 ENCOUNTER — Telehealth: Payer: Self-pay

## 2019-04-02 ENCOUNTER — Encounter: Payer: Self-pay | Admitting: Internal Medicine

## 2019-04-02 ENCOUNTER — Encounter

## 2019-04-02 VITALS — BP 126/75 | HR 96 | Ht 64.0 in | Wt 165.0 lb

## 2019-04-02 DIAGNOSIS — R0789 Other chest pain: Secondary | ICD-10-CM | POA: Diagnosis not present

## 2019-04-02 DIAGNOSIS — I4821 Permanent atrial fibrillation: Secondary | ICD-10-CM

## 2019-04-02 DIAGNOSIS — R079 Chest pain, unspecified: Secondary | ICD-10-CM

## 2019-04-02 DIAGNOSIS — I10 Essential (primary) hypertension: Secondary | ICD-10-CM | POA: Diagnosis not present

## 2019-04-02 NOTE — Telephone Encounter (Signed)
Order placed for lexiscan myoview.  Will send to scheduling for f/u.

## 2019-04-02 NOTE — Progress Notes (Signed)
Electrophysiology TeleHealth Note   Due to national recommendations of social distancing due to COVID 19, an audio/video telehealth visit is felt to be most appropriate for this patient at this time.  See MyChart message from today for the patient's consent to telehealth for [facility].   Date:  04/02/2019   ID:  [name], DOB 09/10/1945, MRN [id number]  Location: patient's home  Provider location:  [facility]  Evaluation Performed: Follow-up visit  PCP:  Binnie Rail, MD   Electrophysiologist:  Dr Rayann Heman  Chief Complaint:  Chest pain  History of Present Illness:    [name] is a 74 y.o. female who presents via telehealth conferencing today.  Since last being seen in our clinic, the patient reports doing very well.  She has been in afib for several years and has tolerated this well with rate control.  She has had "chest tightness" intermittently over the past few weeks.  She thinks that she may have pulled a muscle working out in the gym but finds it worrisome.  She denies SOB and continues to do aerobics classes.  She denies exertional CP.  Today, she denies symptoms of palpitations, lower extremity edema, dizziness, presyncope, or syncope.  The patient is otherwise without complaint today.  The patient denies symptoms of fevers, chills, cough, or new SOB worrisome for COVID 19.  Past Medical History:  Diagnosis Date  . ARTHRITIS, HIPS, BILATERAL   . Atrial fibrillation (HCC)    chronic anticoag - Pradaxa  . BURSITIS, SUBTROCHANTERIC   . HYPERTENSION   . MITRAL VALVE PROLAPSE   . Mixed hyperlipidemia     Past Surgical History:  Procedure Laterality Date  . DILATION AND CURETTAGE OF UTERUS    . TONSILLECTOMY      Current Outpatient Medications  Medication Sig Dispense Refill  . acetaminophen (TYLENOL 8 HOUR ARTHRITIS PAIN) 650 MG CR tablet Take 650 mg by mouth 2 (two) times daily.     [name] apixaban (ELIQUIS) 5 MG TABS tablet Take 1 tablet (5 mg  total) by mouth 2 (two) times daily. 180 tablet 2  . bifidobacterium infantis (ALIGN) capsule Take 1 capsule by mouth daily. 30 capsule 11  . cholecalciferol (VITAMIN D) 1000 UNITS tablet Take 2,000 Units by mouth daily.    [name] co-enzyme Q-10 50 MG capsule Take 50 mg by mouth daily.    [name] diltiazem (CARDIZEM CD) 360 MG 24 hr capsule TAKE 1 CAPSULE BY MOUTH EVERY DAY 90 capsule 0  . diltiazem (CARDIZEM) 30 MG tablet TAKE 1 TABLET EVERY 4 HOURS AS NEEDED FOR AFIB HEART RATE >100 45 tablet 1  . ferrous sulfate 325 (65 FE) MG EC tablet Take 325 mg by mouth daily.    . hydrochlorothiazide (HYDRODIURIL) 25 MG tablet TAKE 1 TABLET BY MOUTH EVERY DAY 90 tablet 3  . lisinopril (PRINIVIL,ZESTRIL) 40 MG tablet TAKE 1 TABLET BY MOUTH EVERY DAY 90 tablet 1  . potassium chloride (KLOR-CON M10) 10 MEQ tablet Take 1 tablet (10 mEq total) by mouth 3 (three) times daily. 270 tablet 3  . pravastatin (PRAVACHOL) 20 MG tablet TAKE 1 TABLET BY MOUTH EVERY DAY 90 tablet 2  . traMADol (ULTRAM) 50 MG tablet TAKE 1 TABLET BY MOUTH EVERY 12 HOURS 180 tablet 0   No current facility-administered medications for this visit.     Allergies:   Nickel   Social History:  The patient  reports that she has quit smoking. She quit after 1.00  year of use. She has never used smokeless tobacco. She reports that she does not drink alcohol or use drugs.   Family History:  The patient's  family history includes Heart failure in her mother; Hypertension in her mother; Leukemia in her father.   ROS:  Please see the history of present illness.   All other systems are personally reviewed and negative.    Exam:    Vital Signs:  BP 126/75   Pulse 96   Ht 5\' 4"  (1.626 m)   Wt 165 lb (74.8 kg)   BMI 28.32 kg/m   Well sounding and appearing, alert and conversant, regular work of breathing,  good skin color Eyes- anicteric, neuro- grossly intact, skin- no apparent rash or lesions or cyanosis, mouth- oral mucosa is pink  Labs/Other Tests  and Data Reviewed:    Recent Labs: 07/12/2018: ALT 11; BUN 23; Creatinine, Ser 0.80; Hemoglobin 15.0; Platelets 245.0; Potassium 3.2; Sodium 141   Wt Readings from Last 3 Encounters:  04/02/19 165 lb (74.8 kg)  07/12/18 164 lb 12.8 oz (74.8 kg)  06/24/18 164 lb (74.4 kg)    ASSESSMENT & PLAN:    1.  Longstanding persistent atrial fibrillation On eliquis for stroke prevention Rate controlled and asymptomatic  2. HTN Stable No change required today  3. Chest pain New, at rest and not with exertion Likely musculoskeletal, though I do think that additional CV testing is prudent.  Given AF, I would not advise ETT or cardiac CT.  I think lexiscan myoview is the best best at this time. If low risk, will follow medically.  If moderate or high risk, would advise cath.  Follow-up:  Return to see EP APP in 2-4 weeks (after myoview) to follow-up on chest pain. If symptoms worsen in the interim, she will need to go to the ED.   Patient Risk:  after full review of this patients clinical status, I feel that they are at moderate risk at this time.  Today, I have spent 15 minutes with the patient with telehealth technology discussing arrhythmia management .    Army Fossa, MD  04/02/2019 2:08 PM     [place] [address] [phone] (office) [phone] (fax)

## 2019-04-02 NOTE — Telephone Encounter (Signed)
-----   Message from Thompson Grayer, MD sent at 04/02/2019  2:19 PM EDT ----- Leane Call to evaluate chest pain.  Follow-up in 2-4 weeks (after myoview) with EP APP in office.

## 2019-04-04 ENCOUNTER — Telehealth (HOSPITAL_COMMUNITY): Payer: Self-pay | Admitting: *Deleted

## 2019-04-04 NOTE — Telephone Encounter (Signed)
Patient given detailed instructions per Myocardial Perfusion Study Information Sheet for the test on 04/08/19 at 8:00. Patient notified to arrive 15 minutes early and that it is imperative to arrive on time for appointment to keep from having the test rescheduled.  If you need to cancel or reschedule your appointment, please call the office within 24 hours of your appointment. . Patient verbalized understanding.[name]

## 2019-04-07 ENCOUNTER — Ambulatory Visit: Payer: Medicare Other | Admitting: Internal Medicine

## 2019-04-08 ENCOUNTER — Other Ambulatory Visit: Payer: Self-pay

## 2019-04-08 ENCOUNTER — Ambulatory Visit (HOSPITAL_COMMUNITY): Payer: Medicare Other | Attending: Cardiovascular Disease

## 2019-04-08 DIAGNOSIS — R079 Chest pain, unspecified: Secondary | ICD-10-CM | POA: Insufficient documentation

## 2019-04-08 LAB — MYOCARDIAL PERFUSION IMAGING
LV dias vol: 42 mL (ref 46–106)
LV sys vol: 12 mL
Peak HR: 122 {beats}/min
Rest HR: 85 {beats}/min
SDS: 2
SRS: 0
SSS: 2
TID: 1.19

## 2019-04-08 MED ORDER — TECHNETIUM TC 99M TETROFOSMIN IV KIT
10.9000 | PACK | Freq: Once | INTRAVENOUS | Status: AC | PRN
  Administered 2019-04-08: 10.9 via INTRAVENOUS
  Filled 2019-04-08: qty 11

## 2019-04-08 MED ORDER — TECHNETIUM TC 99M TETROFOSMIN IV KIT
32.1000 | PACK | Freq: Once | INTRAVENOUS | Status: AC | PRN
  Administered 2019-04-08: 32.1 via INTRAVENOUS
  Filled 2019-04-08: qty 33

## 2019-04-08 MED ORDER — REGADENOSON 0.4 MG/5ML IV SOLN
0.4000 mg | Freq: Once | INTRAVENOUS | Status: AC
  Administered 2019-04-08: 0.4 mg via INTRAVENOUS

## 2019-04-09 ENCOUNTER — Other Ambulatory Visit: Payer: Self-pay | Admitting: Internal Medicine

## 2019-04-24 NOTE — Progress Notes (Signed)
Electrophysiology Office Note Date: 04/25/2019  ID:  [name], DOB February 10, 1946, MRN [id number]  PCP: Binnie Rail, MD Electrophysiologist: Rayann Heman  CC: chest pain follow up  [name] is a 73 y.o. female seen today for Dr Rayann Heman.  She presents today for routine electrophysiology followup.  Since last being seen in our clinic, the patient reports doing very well.  She denies palpitations, dyspnea, PND, orthopnea, nausea, vomiting, dizziness, syncope, edema, weight gain, or early satiety.  Past Medical History:  Diagnosis Date  . ARTHRITIS, HIPS, BILATERAL   . Atrial fibrillation (HCC)    chronic anticoag - Pradaxa  . BURSITIS, SUBTROCHANTERIC   . HYPERTENSION   . MITRAL VALVE PROLAPSE   . Mixed hyperlipidemia    Past Surgical History:  Procedure Laterality Date  . DILATION AND CURETTAGE OF UTERUS    . TONSILLECTOMY      Current Outpatient Medications  Medication Sig Dispense Refill  . acetaminophen (TYLENOL 8 HOUR ARTHRITIS PAIN) 650 MG CR tablet Take 650 mg by mouth 2 (two) times daily.     [name] apixaban (ELIQUIS) 5 MG TABS tablet Take 1 tablet (5 mg total) by mouth 2 (two) times daily. 180 tablet 2  . bifidobacterium infantis (ALIGN) capsule Take 1 capsule by mouth daily. 30 capsule 11  . cholecalciferol (VITAMIN D) 1000 UNITS tablet Take 2,000 Units by mouth daily.    [name] co-enzyme Q-10 50 MG capsule Take 50 mg by mouth daily.    [name] diltiazem (CARDIZEM CD) 360 MG 24 hr capsule TAKE 1 CAPSULE BY MOUTH EVERY DAY 90 capsule 0  . diltiazem (CARDIZEM) 30 MG tablet TAKE 1 TABLET EVERY 4 HOURS AS NEEDED FOR AFIB HEART RATE >100 45 tablet 1  . ferrous sulfate 325 (65 FE) MG EC tablet Take 325 mg by mouth daily.    . hydrochlorothiazide (HYDRODIURIL) 25 MG tablet TAKE 1 TABLET BY MOUTH EVERY DAY 90 tablet 3  . lisinopril (PRINIVIL,ZESTRIL) 40 MG tablet TAKE 1 TABLET BY MOUTH EVERY DAY 90 tablet 1  . potassium chloride (KLOR-CON M10) 10 MEQ tablet Take 1 tablet (10 mEq  total) by mouth 3 (three) times daily. 270 tablet 3  . pravastatin (PRAVACHOL) 20 MG tablet TAKE 1 TABLET BY MOUTH EVERY DAY 90 tablet 2  . traMADol (ULTRAM) 50 MG tablet TAKE 1 TABLET BY MOUTH EVERY 12 HOURS 180 tablet 0   No current facility-administered medications for this visit.     Allergies:   Nickel   Social History: Social History   Socioeconomic History  . Marital status: Married    Spouse name: Not on file  . Number of children: 1  . Years of education: Not on file  . Highest education level: Not on file  Occupational History  . Not on file  Social Needs  . Financial resource strain: Not hard at all  . Food insecurity    Worry: Never true    Inability: Never true  . Transportation needs    Medical: No    Non-medical: No  Tobacco Use  . Smoking status: Former Smoker    Years: 1.00  . Smokeless tobacco: Never Used  . Tobacco comment: social age 54  Substance and Sexual Activity  . Alcohol use: No  . Drug use: No  . Sexual activity: Yes  Lifestyle  . Physical activity    Days per week: 3 days    Minutes per session: 60 min  . Stress: Not at all  Relationships  . Social connections    Talks on phone: More than three times a week    Gets together: More than three times a week    Attends religious service: Never    Active member of club or organization: Yes    Attends meetings of clubs or organizations: More than 4 times per year    Relationship status: Married  . Intimate partner violence    Fear of current or ex partner: No    Emotionally abused: No    Physically abused: No    Forced sexual activity: No  Other Topics Concern  . Not on file  Social History Narrative  . Not on file    Family History: Family History  Problem Relation Age of Onset  . Hypertension Mother   . Heart failure Mother   . Leukemia Father     Review of Systems: All other systems reviewed and are otherwise negative except as noted above.   Physical Exam: VS:  BP  118/68   Pulse 93   Ht 5\' 4"  (1.626 m)   Wt 171 lb (77.6 kg)   BMI 29.35 kg/m  , BMI Body mass index is 29.35 kg/m. Wt Readings from Last 3 Encounters:  04/25/19 171 lb (77.6 kg)  04/08/19 165 lb (74.8 kg)  04/02/19 165 lb (74.8 kg)    GEN- The patient is well appearing, alert and oriented x 3 today.   HEENT: normocephalic, atraumatic; sclera clear, conjunctiva pink; hearing intact; oropharynx clear; neck supple  Lungs- Clear to ausculation bilaterally, normal work of breathing.  No wheezes, rales, rhonchi Heart- Irregular rate and rhythm, no murmurs, rubs or gallops, PMI not laterally displaced GI- soft, non-tender, non-distended, bowel sounds present  Extremities- no clubbing, cyanosis, or edema  MS- no significant deformity or atrophy Skin- warm and dry, no rash or lesion  Psych- euthymic mood, full affect Neuro- strength and sensation are intact   EKG:  EKG is not ordered today.  Recent Labs: 07/12/2018: ALT 11; BUN 23; Creatinine, Ser 0.80; Hemoglobin 15.0; Platelets 245.0; Potassium 3.2; Sodium 141    Other studies Reviewed: Additional studies/ records that were reviewed today include: Dr Jackalyn Lombard notes, ETT results  Assessment and Plan:  1.  Chest pain Myoview low risk, EF 71% She remains active doing low impact aerobics frequently without chest pain or shortness of breath. Her chest pain features now are very atypical and she wonders if are related to stress or anxiety. I have encouraged her to remain active and let us know if symptoms change.  Labs today as below.   2.  HTN Stable No change required today  3.  Longstanding persistent atrial fibrillation Continue Eliquis - BMET, CBC today Resting heart rate in the 90's today. She feels well. Will check labs as above. No changes today    Current medicines are reviewed at length with the patient today.   The patient does not have concerns regarding her medicines.  The following changes were made today:  none   Labs/ tests ordered today include: BMET, CBC No orders of the defined types were placed in this encounter.    Disposition:   Follow up with me in 12 months     Signed, Chanetta Marshall, NP 04/25/2019 8:29 AM   [facility] [place] [id number] [phone] (office) [phone] (fax)

## 2019-04-25 ENCOUNTER — Other Ambulatory Visit: Payer: Self-pay

## 2019-04-25 ENCOUNTER — Ambulatory Visit (INDEPENDENT_AMBULATORY_CARE_PROVIDER_SITE_OTHER): Payer: Medicare Other | Admitting: Nurse Practitioner

## 2019-04-25 VITALS — BP 118/68 | HR 93 | Ht 64.0 in | Wt 171.0 lb

## 2019-04-25 DIAGNOSIS — R079 Chest pain, unspecified: Secondary | ICD-10-CM

## 2019-04-25 DIAGNOSIS — I4821 Permanent atrial fibrillation: Secondary | ICD-10-CM | POA: Diagnosis not present

## 2019-04-25 DIAGNOSIS — I10 Essential (primary) hypertension: Secondary | ICD-10-CM

## 2019-04-25 LAB — CBC
Hematocrit: 45.4 % (ref 34.0–46.6)
Hemoglobin: 15.5 g/dL (ref 11.1–15.9)
MCH: 32.6 pg (ref 26.6–33.0)
MCHC: 34.1 g/dL (ref 31.5–35.7)
MCV: 95 fL (ref 79–97)
Platelets: 237 10*3/uL (ref 150–450)
RBC: 4.76 x10E6/uL (ref 3.77–5.28)
RDW: 12.1 % (ref 11.7–15.4)
WBC: 7.9 10*3/uL (ref 3.4–10.8)

## 2019-04-25 LAB — BASIC METABOLIC PANEL
BUN/Creatinine Ratio: 26 (ref 12–28)
BUN: 25 mg/dL (ref 8–27)
CO2: 24 mmol/L (ref 20–29)
Calcium: 10 mg/dL (ref 8.7–10.3)
Chloride: 100 mmol/L (ref 96–106)
Creatinine, Ser: 0.97 mg/dL (ref 0.57–1.00)
GFR calc Af Amer: 67 mL/min/{1.73_m2} (ref >59)
GFR calc non Af Amer: 59 mL/min/{1.73_m2} — ABNORMAL LOW (ref >59)
Glucose: 92 mg/dL (ref 65–99)
Potassium: 4.3 mmol/L (ref 3.5–5.2)
Sodium: 141 mmol/L (ref 134–144)

## 2019-04-25 NOTE — Patient Instructions (Signed)
Medication Instructions:  none If you need a refill on your cardiac medications before your next appointment, please call your pharmacy.   Lab work:TODAY CBC BMET If you have labs (blood work) drawn today and your tests are completely normal, you will receive your results only by: [name] MyChart Message (if you have MyChart) OR . A paper copy in the mail If you have any lab test that is abnormal or we need to change your treatment, we will call you to review the results.  Testing/Procedures: none  Follow-Up: 1 YEAR with [place], you and your health needs are our priority.  As part of our continuing mission to provide you with exceptional heart care, we have created designated Provider Care Teams.  These Care Teams include your primary Cardiologist (physician) and Advanced Practice Providers (APPs -  Physician Assistants and Nurse Practitioners) who all work together to provide you with the care you need, when you need it. .   Any Other Special Instructions Will Be Listed Below (If Applicable).

## 2019-04-29 ENCOUNTER — Telehealth: Payer: Self-pay

## 2019-04-29 NOTE — Telephone Encounter (Signed)
Notes recorded by Frederik Schmidt, RN on 04/29/2019 at 9:23 AM EDT  Lpm with results 9/22  ------

## 2019-04-29 NOTE — Telephone Encounter (Signed)
-----   Message from Amber K Seiler, NP sent at 04/29/2019  8:43 AM EDT ----- Please notify patient of stable labs. Thanks! 

## 2019-05-10 DIAGNOSIS — Z23 Encounter for immunization: Secondary | ICD-10-CM | POA: Diagnosis not present

## 2019-05-12 ENCOUNTER — Other Ambulatory Visit: Payer: Self-pay | Admitting: Internal Medicine

## 2019-05-12 NOTE — Telephone Encounter (Signed)
Last OV 11/12/18 Last RF 01/31/19 Next OV NA

## 2019-05-29 DIAGNOSIS — Z124 Encounter for screening for malignant neoplasm of cervix: Secondary | ICD-10-CM | POA: Diagnosis not present

## 2019-05-29 DIAGNOSIS — Z6830 Body mass index (BMI) 30.0-30.9, adult: Secondary | ICD-10-CM | POA: Diagnosis not present

## 2019-06-04 NOTE — Progress Notes (Signed)
Virtual Visit via Video Note  I connected with [name] on 06/05/19 at  8:00 AM EDT by a video enabled telemedicine application and verified that I am speaking with the correct person using two identifiers.   I discussed the limitations of evaluation and management by telemedicine and the availability of in person appointments. The patient expressed understanding and agreed to proceed.  The patient is currently at home and I am in the office.    No referring provider.    History of Present Illness: She is here for follow up of her chronic medical conditions.   She is exercising regularly, walking, weights.     She was experiencing chest pain the end of August and ended up having a stress test, which was normal.  She ha not had any chest pain since then.  It may have been stress related.  Her Gyn prescribed her xanax, but she has not used it yet.   Atrial fibrillation, hypertension: She is taking her medication daily. She is compliant with a low sodium diet.  She denies chest pain, palpitations, edema, shortness of breath and regular headaches.   Her BP this am was 122/68.  Hyperlipidemia: She is taking her medication daily. She is compliant with a low fat/cholesterol diet. She denies myalgias.   Prediabetes:  She is compliant with a low sugar/carbohydrate diet.  She is exercising regularly.  Chronic pain management for osteoarthritis of hips, feet, hip bursitis: She is taking tramadol with Tylenol twice daily.  She is taking medication appropriately.  She denies any side effects.  She feels the medication is effective.  She feels her pain is controlled.  Medication is helping her being warm active and perform her ADLs.  Hand OA: she is having increased pain her thumb.  She wonders about getting an injection - if that would help.    Review of Systems  Constitutional: Negative for chills and fever.  Respiratory: Negative for cough, shortness of breath and wheezing.    Cardiovascular: Negative for chest pain, palpitations and leg swelling.  Musculoskeletal: Positive for joint pain. Negative for myalgias.  Neurological: Negative for dizziness and headaches.      Social History   Socioeconomic History  . Marital status: Married    Spouse name: Not on file  . Number of children: 1  . Years of education: Not on file  . Highest education level: Not on file  Occupational History  . Not on file  Social Needs  . Financial resource strain: Not hard at all  . Food insecurity    Worry: Never true    Inability: Never true  . Transportation needs    Medical: No    Non-medical: No  Tobacco Use  . Smoking status: Former Smoker    Years: 1.00  . Smokeless tobacco: Never Used  . Tobacco comment: social age 10  Substance and Sexual Activity  . Alcohol use: No  . Drug use: No  . Sexual activity: Yes  Lifestyle  . Physical activity    Days per week: 3 days    Minutes per session: 60 min  . Stress: Not at all  Relationships  . Social connections    Talks on phone: More than three times a week    Gets together: More than three times a week    Attends religious service: Never    Active member of club or organization: Yes    Attends meetings of clubs or organizations: More than 4 times per  year    Relationship status: Married  Other Topics Concern  . Not on file  Social History Narrative  . Not on file     Observations/Objective: Appears well in NAD Breathing normally Normal mood and affect    BP Readings from Last 3 Encounters:  04/25/19 118/68  04/02/19 126/75  07/12/18 (!) 118/58     Assessment and Plan:  See Problem List for Assessment and Plan of chronic medical problems.   Follow Up Instructions:    I discussed the assessment and treatment plan with the patient. The patient was provided an opportunity to ask questions and all were answered. The patient agreed with the plan and demonstrated an understanding of the  instructions.   The patient was advised to call back or seek an in-person evaluation if the symptoms worsen or if the condition fails to improve as anticipated.  Follow-up in 6 months   Binnie Rail, MD

## 2019-06-05 ENCOUNTER — Encounter: Payer: Self-pay | Admitting: Internal Medicine

## 2019-06-05 ENCOUNTER — Ambulatory Visit (INDEPENDENT_AMBULATORY_CARE_PROVIDER_SITE_OTHER): Payer: Medicare Other | Admitting: Internal Medicine

## 2019-06-05 DIAGNOSIS — I10 Essential (primary) hypertension: Secondary | ICD-10-CM | POA: Diagnosis not present

## 2019-06-05 DIAGNOSIS — E782 Mixed hyperlipidemia: Secondary | ICD-10-CM

## 2019-06-05 DIAGNOSIS — M19049 Primary osteoarthritis, unspecified hand: Secondary | ICD-10-CM | POA: Insufficient documentation

## 2019-06-05 DIAGNOSIS — M150 Primary generalized (osteo)arthritis: Secondary | ICD-10-CM

## 2019-06-05 DIAGNOSIS — I4821 Permanent atrial fibrillation: Secondary | ICD-10-CM | POA: Diagnosis not present

## 2019-06-05 DIAGNOSIS — R7303 Prediabetes: Secondary | ICD-10-CM | POA: Diagnosis not present

## 2019-06-05 MED ORDER — PRAVASTATIN SODIUM 20 MG PO TABS: 20.0000 mg | ORAL_TABLET | Freq: Every day | ORAL | 1 refills | Status: DC

## 2019-06-05 MED ORDER — DILTIAZEM HCL ER COATED BEADS 360 MG PO CP24: ORAL_CAPSULE | ORAL | 1 refills | Status: DC

## 2019-06-05 MED ORDER — POTASSIUM CHLORIDE CRYS ER 10 MEQ PO TBCR: 10.0000 meq | EXTENDED_RELEASE_TABLET | Freq: Three times a day (TID) | ORAL | 1 refills | Status: DC

## 2019-06-05 MED ORDER — LISINOPRIL 40 MG PO TABS: 40.0000 mg | ORAL_TABLET | Freq: Every day | ORAL | 1 refills | Status: DC

## 2019-06-05 MED ORDER — HYDROCHLOROTHIAZIDE 25 MG PO TABS: 25.0000 mg | ORAL_TABLET | Freq: Every day | ORAL | 1 refills | Status: DC

## 2019-06-05 NOTE — Assessment & Plan Note (Signed)
Following what cardiology No concerning symptoms On cardizem, eliquis

## 2019-06-05 NOTE — Assessment & Plan Note (Signed)
Interested in having an injection in thumb Will refer to hand ortho

## 2019-06-05 NOTE — Assessment & Plan Note (Signed)
Pain in multiple joints Taking tramadol and tylenol twice a day Pain controlled Continue current regimen Taking medication appropriately Continue regular exercise

## 2019-06-05 NOTE — Assessment & Plan Note (Signed)
Continue statin. 

## 2019-06-05 NOTE — Assessment & Plan Note (Signed)
Eating too many sweets - she will decrease Will hold off on checking a1c due to COVID pandemic Continue regular exercise F/u in 6 months

## 2019-06-05 NOTE — Assessment & Plan Note (Signed)
BP well controlled Current regimen effective and well tolerated Continue current medications at current doses  

## 2019-06-20 ENCOUNTER — Ambulatory Visit (INDEPENDENT_AMBULATORY_CARE_PROVIDER_SITE_OTHER): Payer: Medicare Other | Admitting: *Deleted

## 2019-06-20 DIAGNOSIS — Z0000 Encounter for general adult medical examination without abnormal findings: Secondary | ICD-10-CM | POA: Diagnosis not present

## 2019-06-20 NOTE — Progress Notes (Addendum)
Subjective:   [name] is a 73 y.o. female who presents for Medicare Annual (Subsequent) preventive examination. I connected with patient by a telephone and verified that I am speaking with the correct person using two identifiers. Patient stated full name and DOB. Patient gave permission to continue with telephonic visit. Patient's location was at home and Nurse's location was at [place] office. Participants during this visit included patient and nurse.  Review of Systems:   Cardiac Risk Factors include: advanced age (>97men, >6 women);hypertension;dyslipidemia Sleep patterns: feels rested on waking, gets up 2-3 times nightly to void and sleeps 6-7 hours nightly.    Home Safety/Smoke Alarms: Feels safe in home. Smoke alarms in place.  Living environment; residence and Firearm Safety: [place], can live on one level. Lives with husband, no needs for DME, good support system Seat Belt Safety/Bike Helmet: Wears seat belt.     Objective:     Vitals: There were no vitals taken for this visit.  There is no height or weight on file to calculate BMI.  Advanced Directives 06/20/2019 04/18/2018 11/15/2016 10/11/2016 03/28/2016 12/08/2015 10/17/2015  Does Patient Have a Medical Advance Directive? Yes Yes Yes Yes Yes Yes No  Type of Paramedic of [place];Living will [place];Living will [place];Living will [place];Living will - Living will;Healthcare Power of Attorney -  Does patient want to make changes to medical advance directive? - - - - - - -  Copy of [place] in Chart? No - copy requested No - copy requested No - copy requested - No - copy requested - -  Would patient like information on creating a medical advance directive? - - No - Patient declined - - - No - patient declined information    Tobacco Social History   Tobacco Use  Smoking Status Former Smoker  . Years: 1.00   Smokeless Tobacco Never Used  Tobacco Comment   social age 4     Counseling given: Not Answered Comment: social age 73  Past Medical History:  Diagnosis Date  . ARTHRITIS, HIPS, BILATERAL   . Atrial fibrillation (HCC)    chronic anticoag - Pradaxa  . BURSITIS, SUBTROCHANTERIC   . HYPERTENSION   . MITRAL VALVE PROLAPSE   . Mixed hyperlipidemia    Past Surgical History:  Procedure Laterality Date  . DILATION AND CURETTAGE OF UTERUS    . TONSILLECTOMY     Family History  Problem Relation Age of Onset  . Hypertension Mother   . Heart failure Mother   . Leukemia Father    Social History   Socioeconomic History  . Marital status: Married    Spouse name: Not on file  . Number of children: 1  . Years of education: Not on file  . Highest education level: Not on file  Occupational History  . Occupation: Retired  Scientific laboratory technician  . Financial resource strain: Not hard at all  . Food insecurity    Worry: Never true    Inability: Never true  . Transportation needs    Medical: No    Non-medical: No  Tobacco Use  . Smoking status: Former Smoker    Years: 1.00  . Smokeless tobacco: Never Used  . Tobacco comment: social age 51  Substance and Sexual Activity  . Alcohol use: No  . Drug use: No  . Sexual activity: Yes  Lifestyle  . Physical activity    Days per week:  3 days    Minutes per session: 60 min  . Stress: Not at all  Relationships  . Social connections    Talks on phone: More than three times a week    Gets together: More than three times a week    Attends religious service: Never    Active member of club or organization: Yes    Attends meetings of clubs or organizations: More than 4 times per year    Relationship status: Married  Other Topics Concern  . Not on file  Social History Narrative  . Not on file    Outpatient Encounter Medications as of 06/20/2019  Medication Sig  . acetaminophen (TYLENOL 8 HOUR ARTHRITIS PAIN) 650 MG CR tablet Take 650 mg by  mouth 2 (two) times daily.   [name] ALPRAZolam (XANAX) 0.5 MG tablet Take 0.5 mg by mouth 3 (three) times daily as needed.  [name] apixaban (ELIQUIS) 5 MG TABS tablet Take 1 tablet (5 mg total) by mouth 2 (two) times daily.  . bifidobacterium infantis (ALIGN) capsule Take 1 capsule by mouth daily.  . cholecalciferol (VITAMIN D) 1000 UNITS tablet Take 2,000 Units by mouth daily.  [name] co-enzyme Q-10 50 MG capsule Take 50 mg by mouth daily.  [name] diltiazem (CARDIZEM CD) 360 MG 24 hr capsule TAKE 1 CAPSULE BY MOUTH EVERY DAY  . diltiazem (CARDIZEM) 30 MG tablet TAKE 1 TABLET EVERY 4 HOURS AS NEEDED FOR AFIB HEART RATE >100  . ferrous sulfate 325 (65 FE) MG EC tablet Take 325 mg by mouth daily.  . hydrochlorothiazide (HYDRODIURIL) 25 MG tablet Take 1 tablet (25 mg total) by mouth daily.  [name] lisinopril (ZESTRIL) 40 MG tablet Take 1 tablet (40 mg total) by mouth daily.  . potassium chloride (KLOR-CON M10) 10 MEQ tablet Take 1 tablet (10 mEq total) by mouth 3 (three) times daily.  . pravastatin (PRAVACHOL) 20 MG tablet Take 1 tablet (20 mg total) by mouth daily.  . traMADol (ULTRAM) 50 MG tablet Take 1 tablet (50 mg total) by mouth every 12 (twelve) hours. Need office visit for more refills.   No facility-administered encounter medications on file as of 06/20/2019.     Activities of Daily Living In your present state of health, do you have any difficulty performing the following activities: 06/20/2019  Hearing? N  Vision? N  Difficulty concentrating or making decisions? N  Walking or climbing stairs? N  Dressing or bathing? N  Doing errands, shopping? N  Preparing Food and eating ? N  Using the Toilet? N  In the past six months, have you accidently leaked urine? N  Do you have problems with loss of bowel control? N  Managing your Medications? N  Managing your Finances? N  Housekeeping or managing your Housekeeping? N  Some recent data might be hidden    Patient Care Team: Binnie Rail, MD as PCP -  General (Internal Medicine) Almedia Balls, MD (Orthopedic Surgery) Maisie Fus, MD (Obstetrics and Gynecology) Thompson Grayer, MD (Cardiology) Erline Levine, MD (Neurosurgery) Stefanie Libel, MD (Sports Medicine) Richmond Campbell, MD (Gastroenterology) Patsey Berthold, NP as Nurse Practitioner (Cardiology) Izora Gala, MD as Consulting Physician (Otolaryngology) Luberta Mutter, MD as Consulting Physician (Ophthalmology)    Assessment:   This is a routine wellness examination for [place]. Physical assessment deferred to PCP.   Exercise Activities and Dietary recommendations Current Exercise Habits: Home exercise routine, Type of exercise: calisthenics;walking(aerobics x3 per week), Time (Minutes): 50, Frequency (Times/Week): 4, Weekly Exercise (Minutes/Week): 200,  Intensity: Mild, Exercise limited by: orthopedic condition(s)  Diet (meal preparation, eat out, water intake, caffeinated beverages, dairy products, fruits and vegetables): in general, a "healthy" diet     Reviewed heart healthy diet.Encouraged patient to increase daily water and healthy fluid intake.  Goals    . patient     Will explore medical issues so she can get back to exercise as the treadmill.     . Patient Stated     Continue to play golf and stay as physically active as possible. Continue to travel, enjoy life and stay social.       Fall Risk Fall Risk  06/20/2019 06/20/2019 04/18/2018 05/15/2017 10/11/2016  Falls in the past year? 0 0 No No No  Number falls in past yr: 0 0 - - -  Injury with Fall? 0 0 - - -  Risk for fall due to : - - - - -  Follow up Falls prevention discussed - - - -   Is the patient's home free of loose throw rugs in walkways, pet beds, electrical cords, etc?   yes      Grab bars in the bathroom? yes      Handrails on the stairs?   yes      Adequate lighting?   yes  Depression Screen PHQ 2/9 Scores 06/20/2019 06/20/2019 04/18/2018 05/15/2017  PHQ - 2 Score 0 0 0 0     Cognitive  Function MMSE - Mini Mental State Exam 04/18/2018 03/28/2016  Not completed: - (No Data)  Orientation to time 5 -  Orientation to Place 5 -  Registration 3 -  Attention/ Calculation 5 -  Recall 3 -  Language- name 2 objects 2 -  Language- repeat 1 -  Language- follow 3 step command 3 -  Language- read & follow direction 1 -  Write a sentence 1 -  Copy design 1 -  Total score 30 -     6CIT Screen 06/20/2019  What Year? 0 points  What month? 0 points  What time? 0 points  Count back from 20 0 points  Months in reverse 0 points  Repeat phrase 0 points  Total Score 0    Immunization History  Administered Date(s) Administered  . Influenza Split 04/07/2013  . Influenza, High Dose Seasonal PF 05/16/2016, 05/15/2017, 04/12/2018, 05/10/2019  . Influenza,inj,Quad PF,6+ Mos 05/03/2015  . Influenza-Unspecified 05/07/2014  . Pneumococcal Conjugate-13 10/14/2014  . Pneumococcal Polysaccharide-23 05/15/2012  . Td 12/05/2005, 08/12/2012  . Zoster 08/07/2006  . Zoster Recombinat (Shingrix) 10/24/2017, 01/04/2018    Screening Tests Health Maintenance  Topic Date Due  . MAMMOGRAM  02/27/2021  . DEXA SCAN  05/24/2022  . TETANUS/TDAP  08/12/2022  . COLONOSCOPY  02/14/2023  . INFLUENZA VACCINE  Completed  . Hepatitis C Screening  Completed  . PNA vac Low Risk Adult  Completed      Plan:    Reviewed health maintenance screenings with patient today and relevant education, vaccines, and/or referrals were provided.   Continue to eat heart healthy diet (full of fruits, vegetables, whole grains, lean protein, water--limit salt, fat, and sugar intake) and increase physical activity as tolerated.  Continue doing brain stimulating activities (puzzles, reading, adult coloring books, staying active) to keep memory sharp.   I have personally reviewed and noted the following in the patient's chart:   . Medical and social history . Use of alcohol, tobacco or illicit drugs  . Current  medications and supplements . Functional ability and status .  Nutritional status . Physical activity . Advanced directives . List of other physicians . Screenings to include cognitive, depression, and falls . Referrals and appointments  In addition, I have reviewed and discussed with patient certain preventive protocols, quality metrics, and best practice recommendations. A written personalized care plan for preventive services as well as general preventive health recommendations were provided to patient.     Michiel Cowboy, RN  06/20/2019   Medical screening examination/treatment/procedure(s) were performed by non-physician practitioner and as supervising physician I was immediately available for consultation/collaboration. I agree with above. Binnie Rail, MD

## 2019-07-01 DIAGNOSIS — D225 Melanocytic nevi of trunk: Secondary | ICD-10-CM | POA: Diagnosis not present

## 2019-07-01 DIAGNOSIS — D2371 Other benign neoplasm of skin of right lower limb, including hip: Secondary | ICD-10-CM | POA: Diagnosis not present

## 2019-07-01 DIAGNOSIS — Z23 Encounter for immunization: Secondary | ICD-10-CM | POA: Diagnosis not present

## 2019-07-01 DIAGNOSIS — Z86018 Personal history of other benign neoplasm: Secondary | ICD-10-CM | POA: Diagnosis not present

## 2019-07-01 DIAGNOSIS — D485 Neoplasm of uncertain behavior of skin: Secondary | ICD-10-CM | POA: Diagnosis not present

## 2019-07-01 DIAGNOSIS — L821 Other seborrheic keratosis: Secondary | ICD-10-CM | POA: Diagnosis not present

## 2019-07-02 ENCOUNTER — Other Ambulatory Visit: Payer: Self-pay

## 2019-07-06 ENCOUNTER — Encounter: Payer: Self-pay | Admitting: Internal Medicine

## 2019-07-22 DIAGNOSIS — M79644 Pain in right finger(s): Secondary | ICD-10-CM | POA: Diagnosis not present

## 2019-07-22 DIAGNOSIS — M19041 Primary osteoarthritis, right hand: Secondary | ICD-10-CM | POA: Insufficient documentation

## 2019-07-22 DIAGNOSIS — M13841 Other specified arthritis, right hand: Secondary | ICD-10-CM | POA: Diagnosis not present

## 2019-07-26 ENCOUNTER — Other Ambulatory Visit: Payer: Self-pay | Admitting: Internal Medicine

## 2019-07-28 NOTE — Telephone Encounter (Signed)
[place] Controlled Database Checked Last filled: 05/12/19 # 180 LOV w/you: 06/05/19 Next appt w/you: 10/28/19

## 2019-07-30 DIAGNOSIS — R197 Diarrhea, unspecified: Secondary | ICD-10-CM | POA: Diagnosis not present

## 2019-08-12 DIAGNOSIS — D485 Neoplasm of uncertain behavior of skin: Secondary | ICD-10-CM | POA: Diagnosis not present

## 2019-08-25 DIAGNOSIS — K099 Cyst of oral region, unspecified: Secondary | ICD-10-CM | POA: Diagnosis not present

## 2019-08-26 ENCOUNTER — Other Ambulatory Visit: Payer: Self-pay | Admitting: Internal Medicine

## 2019-08-27 ENCOUNTER — Ambulatory Visit: Payer: Medicare Other | Attending: Internal Medicine

## 2019-08-27 DIAGNOSIS — Z23 Encounter for immunization: Secondary | ICD-10-CM | POA: Insufficient documentation

## 2019-08-27 NOTE — Progress Notes (Signed)
   Covid-19 Vaccination Clinic  Name:  [name]    MRN: [id number] DOB: [birth year]  08/27/2019  [name] was observed post Covid-19 immunization for 15 minutes without incidence. She was provided with Vaccine Information Sheet and instruction to access the V-Safe system.   [name] was instructed to call 911 with any severe reactions post vaccine: [name] Difficulty breathing  . Swelling of your face and throat  . A fast heartbeat  . A bad rash all over your body  . Dizziness and weakness    Immunizations Administered    Name Date Dose VIS Date Route   Pfizer COVID-19 Vaccine 08/27/2019  5:36 PM 0.3 mL 07/18/2019 Intramuscular   Manufacturer: [employer]   Lot: [id number]   [place]: [id number]

## 2019-08-29 DIAGNOSIS — R197 Diarrhea, unspecified: Secondary | ICD-10-CM | POA: Diagnosis not present

## 2019-08-29 DIAGNOSIS — E538 Deficiency of other specified B group vitamins: Secondary | ICD-10-CM | POA: Diagnosis not present

## 2019-08-29 DIAGNOSIS — R790 Abnormal level of blood mineral: Secondary | ICD-10-CM | POA: Diagnosis not present

## 2019-09-01 ENCOUNTER — Other Ambulatory Visit: Payer: Self-pay

## 2019-09-01 NOTE — Telephone Encounter (Signed)
Pt would like to know if she can switch her diltiazem 360 mg qd to diltiazem 180 bid due to the cost of medication. Please advise

## 2019-09-03 ENCOUNTER — Telehealth: Payer: Self-pay | Admitting: Internal Medicine

## 2019-09-03 NOTE — Telephone Encounter (Signed)
Pt calling and asking if Dr. Rayann Heman could change her medication diltiazem 360 mg pt taking 1 tablet daily which cost pt $400. Pt would like for Dr. Rayann Heman to change diltiazem to 180 mg tablet, pt taking BID costing pt $18. Pt states that she is almost out of medication. Pt would like a call back concerning this matter. Please address

## 2019-09-04 MED ORDER — DILTIAZEM HCL ER COATED BEADS 180 MG PO CP24: 180.0000 mg | ORAL_CAPSULE | Freq: Two times a day (BID) | ORAL | 3 refills | Status: DC

## 2019-09-04 NOTE — Telephone Encounter (Signed)
Changed Pt prescription per request.  Dr. Rayann Heman approved.  Pt notified.

## 2019-09-04 NOTE — Telephone Encounter (Signed)
I spoke to the patient and she said that it was taken care of. 1/28

## 2019-09-14 ENCOUNTER — Ambulatory Visit: Payer: Medicare Other

## 2019-09-17 ENCOUNTER — Ambulatory Visit: Payer: Medicare Other | Attending: Internal Medicine

## 2019-09-17 DIAGNOSIS — Z23 Encounter for immunization: Secondary | ICD-10-CM | POA: Insufficient documentation

## 2019-09-17 NOTE — Progress Notes (Signed)
   Covid-19 Vaccination Clinic  Name:  [name]    MRN: [id number] DOB: 28-Oct-1945  09/17/2019  [name] was observed post Covid-19 immunization for 15 minutes without incidence. She was provided with Vaccine Information Sheet and instruction to access the V-Safe system.   [name] was instructed to call 911 with any severe reactions post vaccine: [name] Difficulty breathing  . Swelling of your face and throat  . A fast heartbeat  . A bad rash all over your body  . Dizziness and weakness    Immunizations Administered    Name Date Dose VIS Date Route   Pfizer COVID-19 Vaccine 09/17/2019  1:46 PM 0.3 mL 07/18/2019 Intramuscular   Manufacturer: [employer], [employer]   Lot: [id number]   [place]: [id number]

## 2019-09-19 ENCOUNTER — Other Ambulatory Visit (HOSPITAL_COMMUNITY): Payer: Self-pay | Admitting: Nurse Practitioner

## 2019-09-19 NOTE — Telephone Encounter (Signed)
Pt last saw Chanetta Marshall, NP on 04/25/19, last labs 04/25/19 Creat 0.97, age 74, weight 77.6, based on specified criteria pt is on appropriate dosage of Eliquis 5mg  BID.  Will refill rx.

## 2019-10-27 NOTE — Patient Instructions (Addendum)
Ask GI about Viberzi - this is a medication for IBS- diarrhea predominant.  Try imodium for the diarrhea.     Blood work was ordered.     Medications reviewed and updated.  Changes include :   none     Please followup in 6 months

## 2019-10-27 NOTE — Progress Notes (Signed)
Subjective:    Patient ID: [name], female    DOB: 1946/03/27, 74 y.o.   MRN: [id number]  HPI The patient is here for follow up of their chronic medical problems, including Afib, hypertension, hyperlipidemia, prediabetes, chronic pain from OA of hips, feet  She is taking all of her medications as prescribed.    She is exercising regularly.   She walks and does weights.   She takes tramadol and tylenol twice daily for her pain and her pain is controlled.   She saw GI for her IBS.  The did blood work - and he advised to stop iron supplement.  Her B12 was very low and she started sublingual B12 supplementation.  He did not feel that she needed any medication at this time-he just advised revising her diet-FODMAP diet.  She can have symptoms couple of times a week.  It occurs often when she goes out to eat, but can even occur at home.  There is no obvious food that causes it.  She did try pancreatic enzymes in the past and that did not seem to help.    Medications and allergies reviewed with patient and updated if appropriate.  Patient Active Problem List   Diagnosis Date Noted  . B12 deficiency 10/28/2019  . IBS (irritable bowel syndrome) 10/28/2019  . Osteoarthritis of hand 06/05/2019  . Presbycusis of both ears 05/14/2018  . ETD (Eustachian tube dysfunction), bilateral 04/12/2018  . Prediabetes 05/15/2017  . Pain of right heel 01/09/2017  . Primary osteoarthritis of both feet 07/10/2016  . Primary osteoarthritis of both hands 07/10/2016  . Alopecia totalis 06/05/2016  . Dermatochalasis of both upper eyelids 10/12/2015  . H/O dysplastic nevus 08/03/2015  . Obese 10/14/2014  . Neck muscle spasm 05/22/2014  . Degenerative lumbar disc 12/04/2013  . Hematuria, undiagnosed cause 09/02/2013  . Osteopenia 06/11/2013  . Primary generalized (osteo)arthritis 12/18/2011  . Mixed hyperlipidemia 03/25/2009  . Essential hypertension 03/24/2009  . ATRIAL FIBRILLATION 03/24/2009    . Greater trochanteric bursitis of left hip 02/12/2007    Current Outpatient Medications on File Prior to Visit  Medication Sig Dispense Refill  . acetaminophen (TYLENOL 8 HOUR ARTHRITIS PAIN) 650 MG CR tablet Take 650 mg by mouth 2 (two) times daily.     [name] ALPRAZolam (XANAX) 0.5 MG tablet Take 0.5 mg by mouth 3 (three) times daily as needed.    . bifidobacterium infantis (ALIGN) capsule Take 1 capsule by mouth daily. 30 capsule 11  . cholecalciferol (VITAMIN D) 1000 UNITS tablet Take 2,000 Units by mouth daily.    [name] co-enzyme Q-10 50 MG capsule Take 50 mg by mouth daily.    . cyanocobalamin 1000 MCG tablet Take 1,000 mcg by mouth daily.    [name] diltiazem (CARDIZEM CD) 180 MG 24 hr capsule Take 1 capsule (180 mg total) by mouth 2 (two) times daily. 180 capsule 3  . diltiazem (CARDIZEM) 30 MG tablet TAKE 1 TABLET EVERY 4 HOURS AS NEEDED FOR AFIB HEART RATE >100 45 tablet 1  . ELIQUIS 5 MG TABS tablet TAKE 1 TABLET BY MOUTH TWICE A DAY 180 tablet 1  . hydrochlorothiazide (HYDRODIURIL) 25 MG tablet Take 1 tablet (25 mg total) by mouth daily. 90 tablet 1  . lisinopril (ZESTRIL) 40 MG tablet Take 1 tablet (40 mg total) by mouth daily. 90 tablet 1  . potassium chloride (KLOR-CON M10) 10 MEQ tablet Take 1 tablet (10 mEq total) by mouth 3 (three) times daily. 270 tablet  1  . pravastatin (PRAVACHOL) 20 MG tablet TAKE 1 TABLET BY MOUTH EVERY DAY 90 tablet 1  . traMADol (ULTRAM) 50 MG tablet TAKE 1 TABLET EVERY 12 (TWELVE) HOURS. NEED OFFICE VISIT FOR MORE REFILLS. 180 tablet 0   No current facility-administered medications on file prior to visit.    Past Medical History:  Diagnosis Date  . ARTHRITIS, HIPS, BILATERAL   . Atrial fibrillation (HCC)    chronic anticoag - Pradaxa  . BURSITIS, SUBTROCHANTERIC   . HYPERTENSION   . MITRAL VALVE PROLAPSE   . Mixed hyperlipidemia     Past Surgical History:  Procedure Laterality Date  . DILATION AND CURETTAGE OF UTERUS    . TONSILLECTOMY       Social History   Socioeconomic History  . Marital status: Married    Spouse name: Not on file  . Number of children: 1  . Years of education: Not on file  . Highest education level: Not on file  Occupational History  . Occupation: Retired  Tobacco Use  . Smoking status: Former Smoker    Years: 1.00  . Smokeless tobacco: Never Used  . Tobacco comment: social age 65  Substance and Sexual Activity  . Alcohol use: No  . Drug use: No  . Sexual activity: Yes  Other Topics Concern  . Not on file  Social History Narrative  . Not on file   Social Determinants of Health   Financial Resource Strain:   . Difficulty of Paying Living Expenses:   Food Insecurity:   . Worried About Charity fundraiser in the Last Year:   . Arboriculturist in the Last Year:   Transportation Needs:   . Film/video editor (Medical):   [name] Lack of Transportation (Non-Medical):   Physical Activity:   . Days of Exercise per Week:   . Minutes of Exercise per Session:   Stress:   . Feeling of Stress :   Social Connections:   . Frequency of Communication with Friends and Family:   . Frequency of Social Gatherings with Friends and Family:   . Attends Religious Services:   . Active Member of Clubs or Organizations:   . Attends Archivist Meetings:   [name] Marital Status:     Family History  Problem Relation Age of Onset  . Hypertension Mother   . Heart failure Mother   . Leukemia Father     Review of Systems  Constitutional: Negative for chills and fever.  Respiratory: Negative for cough, shortness of breath and wheezing.   Cardiovascular: Negative for chest pain, palpitations and leg swelling.  Skin:       Itching on legs and feet  Neurological: Positive for headaches (occ). Negative for light-headedness.       Objective:   Vitals:   10/28/19 0914  BP: 140/82  Pulse: 91  Resp: 16  Temp: 98 F (36.7 C)  SpO2: 97%   BP Readings from Last 3 Encounters:  10/28/19 140/82   04/25/19 118/68  04/02/19 126/75   Wt Readings from Last 3 Encounters:  10/28/19 171 lb (77.6 kg)  04/25/19 171 lb (77.6 kg)  04/08/19 165 lb (74.8 kg)   Body mass index is 29.35 kg/m.   Physical Exam    Constitutional: Appears well-developed and well-nourished. No distress.  HENT:  Head: Normocephalic and atraumatic.  Neck: Neck supple. No tracheal deviation present. No thyromegaly present.  No cervical lymphadenopathy Cardiovascular: Normal rate, regular rhythm and normal heart  sounds.   No murmur heard. No carotid bruit .  No edema Pulmonary/Chest: Effort normal and breath sounds normal. No respiratory distress. No has no wheezes. No rales.  Skin: Skin is warm and dry. Not diaphoretic.  Psychiatric: Normal mood and affect. Behavior is normal.      Assessment & Plan:    See Problem List for Assessment and Plan of chronic medical problems.    This visit occurred during the SARS-CoV-2 public health emergency.  Safety protocols were in place, including screening questions prior to the visit, additional usage of staff PPE, and extensive cleaning of exam room while observing appropriate contact time as indicated for disinfecting solutions.

## 2019-10-28 ENCOUNTER — Ambulatory Visit (INDEPENDENT_AMBULATORY_CARE_PROVIDER_SITE_OTHER): Payer: Medicare Other | Admitting: Internal Medicine

## 2019-10-28 ENCOUNTER — Encounter: Payer: Self-pay | Admitting: Internal Medicine

## 2019-10-28 ENCOUNTER — Other Ambulatory Visit: Payer: Self-pay

## 2019-10-28 VITALS — BP 140/82 | HR 91 | Temp 98.0°F | Resp 16 | Ht 64.0 in | Wt 171.0 lb

## 2019-10-28 DIAGNOSIS — K589 Irritable bowel syndrome without diarrhea: Secondary | ICD-10-CM

## 2019-10-28 DIAGNOSIS — M150 Primary generalized (osteo)arthritis: Secondary | ICD-10-CM

## 2019-10-28 DIAGNOSIS — I10 Essential (primary) hypertension: Secondary | ICD-10-CM

## 2019-10-28 DIAGNOSIS — E782 Mixed hyperlipidemia: Secondary | ICD-10-CM

## 2019-10-28 DIAGNOSIS — R7303 Prediabetes: Secondary | ICD-10-CM

## 2019-10-28 DIAGNOSIS — I4821 Permanent atrial fibrillation: Secondary | ICD-10-CM | POA: Diagnosis not present

## 2019-10-28 DIAGNOSIS — E538 Deficiency of other specified B group vitamins: Secondary | ICD-10-CM

## 2019-10-28 LAB — CBC WITH DIFFERENTIAL/PLATELET
Basophils Absolute: 0.1 10*3/uL (ref 0.0–0.1)
Basophils Relative: 1.2 % (ref 0.0–3.0)
Eosinophils Absolute: 0 10*3/uL (ref 0.0–0.7)
Eosinophils Relative: 0.7 % (ref 0.0–5.0)
HCT: 43.3 % (ref 36.0–46.0)
Hemoglobin: 14.6 g/dL (ref 12.0–15.0)
Lymphocytes Relative: 31.1 % (ref 12.0–46.0)
Lymphs Abs: 2.3 10*3/uL (ref 0.7–4.0)
MCHC: 33.6 g/dL (ref 30.0–36.0)
MCV: 98.5 fl (ref 78.0–100.0)
Monocytes Absolute: 0.6 10*3/uL (ref 0.1–1.0)
Monocytes Relative: 7.8 % (ref 3.0–12.0)
Neutro Abs: 4.3 10*3/uL (ref 1.4–7.7)
Neutrophils Relative %: 59.2 % (ref 43.0–77.0)
Platelets: 217 10*3/uL (ref 150.0–400.0)
RBC: 4.39 Mil/uL (ref 3.87–5.11)
RDW: 13.2 % (ref 11.5–15.5)
WBC: 7.3 10*3/uL (ref 4.0–10.5)

## 2019-10-28 LAB — COMPREHENSIVE METABOLIC PANEL
ALT: 13 U/L (ref 0–35)
AST: 15 U/L (ref 0–37)
Albumin: 4.5 g/dL (ref 3.5–5.2)
Alkaline Phosphatase: 84 U/L (ref 39–117)
BUN: 17 mg/dL (ref 6–23)
CO2: 30 mEq/L (ref 19–32)
Calcium: 9.6 mg/dL (ref 8.4–10.5)
Chloride: 103 mEq/L (ref 96–112)
Creatinine, Ser: 0.83 mg/dL (ref 0.40–1.20)
GFR: 67.45 mL/min (ref >60.00)
Glucose, Bld: 92 mg/dL (ref 70–99)
Potassium: 3.7 mEq/L (ref 3.5–5.1)
Sodium: 139 mEq/L (ref 135–145)
Total Bilirubin: 0.5 mg/dL (ref 0.2–1.2)
Total Protein: 7.2 g/dL (ref 6.0–8.3)

## 2019-10-28 LAB — VITAMIN B12: Vitamin B-12: 675 pg/mL (ref 211–911)

## 2019-10-28 LAB — LIPID PANEL
Cholesterol: 198 mg/dL (ref 0–200)
HDL: 56.2 mg/dL (ref >39.00)
LDL Cholesterol: 104 mg/dL — ABNORMAL HIGH (ref 0–99)
NonHDL: 141.32
Total CHOL/HDL Ratio: 4
Triglycerides: 185 mg/dL — ABNORMAL HIGH (ref 0.0–149.0)
VLDL: 37 mg/dL (ref 0.0–40.0)

## 2019-10-28 LAB — HEMOGLOBIN A1C: Hgb A1c MFr Bld: 5.4 % (ref 4.6–6.5)

## 2019-10-28 NOTE — Assessment & Plan Note (Signed)
Chronic Check lipid panel  Continue daily statin Regular exercise and healthy diet encouraged  

## 2019-10-28 NOTE — Assessment & Plan Note (Addendum)
Taking B12 1000 mcg SL Check level

## 2019-10-28 NOTE — Assessment & Plan Note (Signed)
Chronic Pain in multiple joints Taking tramadol and tylenol BID Pain controlled, taking medication correctly Continue above Continue regular exercise

## 2019-10-28 NOTE — Assessment & Plan Note (Signed)
Chronic BP well controlled Current regimen effective and well tolerated Continue current medications at current doses cmp  

## 2019-10-28 NOTE — Assessment & Plan Note (Signed)
Chronic Following with cardio On Cardizem, eliquis Cbc, cmp

## 2019-10-28 NOTE — Assessment & Plan Note (Signed)
Chronic Check a1c Low sugar / carb diet Stressed regular exercise  

## 2019-10-28 NOTE — Assessment & Plan Note (Addendum)
Diarrhea a couple of times a week Follows with GI Advised that she could try Imodium when she goes out to eat see if that helps Has discussed some medications with GI may need to follow-up to see if they think she is a candidate for trying some Has worked on revising her diet and has not helped much Blood work revealed with GI low B12, which we will recheck Also showed elevated iron levels and we will recheck that as well-she has stopped her iron supplementation

## 2019-10-29 ENCOUNTER — Encounter: Payer: Self-pay | Admitting: Internal Medicine

## 2019-10-29 LAB — IRON,TIBC AND FERRITIN PANEL
%SAT: 41 % (calc) (ref 16–45)
Ferritin: 506 ng/mL — ABNORMAL HIGH (ref 16–288)
Iron: 132 ug/dL (ref 45–160)
TIBC: 320 mcg/dL (calc) (ref 250–450)

## 2019-11-08 ENCOUNTER — Other Ambulatory Visit: Payer: Self-pay | Admitting: Internal Medicine

## 2019-11-11 ENCOUNTER — Other Ambulatory Visit: Payer: Self-pay

## 2019-11-11 MED ORDER — DILTIAZEM HCL 30 MG PO TABS: ORAL_TABLET | ORAL | 1 refills | Status: DC

## 2019-11-11 NOTE — Telephone Encounter (Signed)
Pt's medication was sent to pt's pharmacy as requested. Confirmation received.  °

## 2019-11-20 ENCOUNTER — Other Ambulatory Visit: Payer: Self-pay

## 2019-11-20 ENCOUNTER — Other Ambulatory Visit: Payer: Self-pay | Admitting: Internal Medicine

## 2019-11-20 MED ORDER — POTASSIUM CHLORIDE CRYS ER 10 MEQ PO TBCR: 10.0000 meq | EXTENDED_RELEASE_TABLET | Freq: Three times a day (TID) | ORAL | 1 refills | Status: DC

## 2020-01-08 ENCOUNTER — Telehealth: Payer: Self-pay | Admitting: Internal Medicine

## 2020-01-08 NOTE — Progress Notes (Signed)
  Chronic Care Management   Outreach Note  01/08/2020 Name: BRYTTNI WISSINK MRN: [id number] DOB: 08/31/1945  Referred by: Binnie Rail, MD Reason for referral : No chief complaint on file.   An unsuccessful telephone outreach was attempted today. The patient was referred to the pharmacist for assistance with care management and care coordination.   This note is not being shared with the patient for the following reason: To respect privacy (The patient or proxy has requested that the information not be shared).  Follow Up Plan:   Earney Hamburg Upstream Scheduler

## 2020-01-17 ENCOUNTER — Other Ambulatory Visit: Payer: Self-pay | Admitting: Internal Medicine

## 2020-01-19 ENCOUNTER — Telehealth: Payer: Self-pay | Admitting: Internal Medicine

## 2020-01-19 NOTE — Progress Notes (Signed)
°  Chronic Care Management   Outreach Note  01/19/2020 Name: [name] MRN: [id number] DOB: Jan 23, 1946  Referred by: Binnie Rail, MD Reason for referral : No chief complaint on file.   An unsuccessful telephone outreach was attempted today. The patient was referred to the pharmacist for assistance with care management and care coordination. This note is not being shared with the patient for the following reason: To respect privacy (The patient or proxy has requested that the information not be shared).  Follow Up Plan:   Earney Hamburg Upstream Scheduler

## 2020-02-02 ENCOUNTER — Other Ambulatory Visit: Payer: Self-pay | Admitting: Internal Medicine

## 2020-02-04 ENCOUNTER — Telehealth: Payer: Self-pay | Admitting: Internal Medicine

## 2020-02-04 NOTE — Progress Notes (Signed)
°  Chronic Care Management   Note  02/04/2020 Name: HAYDYN LIDDELL MRN: [id number] DOB: 12/12/1945  ELLISE KOVACK is a 74 y.o. year old female who is a primary care patient of Burns, Claudina Lick, MD. I reached out to Renee Rival by phone today in response to a referral sent by Ms. Ivan Anchors Burdi's PCP, Binnie Rail, MD.   Ms. Randon was given information about Chronic Care Management services today including:  1. CCM service includes personalized support from designated clinical staff supervised by her physician, including individualized plan of care and coordination with other care providers 2. 24/7 contact phone numbers for assistance for urgent and routine care needs. 3. Service will only be billed when office clinical staff spend 20 minutes or more in a month to coordinate care. 4. Only one practitioner may furnish and bill the service in a calendar month. 5. The patient may stop CCM services at any time (effective at the end of the month) by phone call to the office staff.   Patient wishes to consider information provided and/or speak with a member of the care team before deciding about enrollment in care management services. This note is not being shared with the patient for the following reason: To respect privacy (The patient or proxy has requested that the information not be shared).  Follow up plan:   Earney Hamburg Upstream Scheduler

## 2020-02-05 ENCOUNTER — Other Ambulatory Visit: Payer: Self-pay | Admitting: Internal Medicine

## 2020-03-05 DIAGNOSIS — K099 Cyst of oral region, unspecified: Secondary | ICD-10-CM | POA: Diagnosis not present

## 2020-04-14 ENCOUNTER — Other Ambulatory Visit: Payer: Self-pay | Admitting: Obstetrics & Gynecology

## 2020-04-14 DIAGNOSIS — Z1231 Encounter for screening mammogram for malignant neoplasm of breast: Secondary | ICD-10-CM

## 2020-05-03 ENCOUNTER — Other Ambulatory Visit (HOSPITAL_COMMUNITY): Payer: Self-pay | Admitting: Internal Medicine

## 2020-05-03 DIAGNOSIS — Z20828 Contact with and (suspected) exposure to other viral communicable diseases: Secondary | ICD-10-CM | POA: Diagnosis not present

## 2020-05-03 NOTE — Telephone Encounter (Signed)
Pt's age 74, wt 77.6 kg, SCr 0.83, CrCl 73.95, last appt w/ AS 04/08/19- needs appt - will send in 30 day supply.

## 2020-05-07 ENCOUNTER — Other Ambulatory Visit: Payer: Self-pay | Admitting: Internal Medicine

## 2020-05-08 MED ORDER — TRAMADOL HCL 50 MG PO TABS: 50.0000 mg | ORAL_TABLET | Freq: Two times a day (BID) | ORAL | 0 refills | Status: DC | PRN

## 2020-05-08 NOTE — Addendum Note (Signed)
Addended by: Binnie Rail on: 05/08/2020 01:59 PM   Modules accepted: Orders

## 2020-05-18 DIAGNOSIS — Z23 Encounter for immunization: Secondary | ICD-10-CM | POA: Diagnosis not present

## 2020-05-26 NOTE — Progress Notes (Signed)
Cardiology Office Note Date:  05/27/2020  Patient ID:  [name], [name] Oct 15, 1945, MRN [id number] PCP:  Binnie Rail, MD  Cardiologist/Electrophysiologist: Dr. Rayann Heman     Chief Complaint: annual visit  History of Present Illness: [name] is a 74 y.o. female with history of AFib, HTN.  She comes in today to be seen for Dr. Rayann Heman.  Last seen by him via a tele health visit Aug 2020.  Doing well with rate controlled AFib for years.  Mentioned some CP that she suspected was musculoskeletal s/s workout at the gym, but worried her, planned for stress testing. She had f/o with Genene Churn, NP in Sept.  She was very active without CP, her myoview low risk.  CP felt to be very atypical and pt suspected stress/anxiety related.  TODAY She is doing great.  Her and her husband have been traveling, [place] back from [place], they did 5 [place].  [facility] will get her a little winded (for years), but reports good exertional caacity, no CP. She infrequently will feel palpitations fast rates and use her PRN dilt dose. No fainting She feels like on Eliquis she tends to bleed a littl longer then she did on xarelto.  Stopped easily nough with placing a band aide, no excessive bleeding or unprovoked bleeding.  And as it tuned out not any financial benefit in the switch to Eliquis, (the reason she made the change)    Past Medical History:  Diagnosis Date   ARTHRITIS, HIPS, BILATERAL    Atrial fibrillation (HCC)    chronic anticoag - Pradaxa   BURSITIS, SUBTROCHANTERIC    HYPERTENSION    MITRAL VALVE PROLAPSE    Mixed hyperlipidemia     Past Surgical History:  Procedure Laterality Date   DILATION AND CURETTAGE OF UTERUS     TONSILLECTOMY      Current Outpatient Medications  Medication Sig Dispense Refill   acetaminophen (TYLENOL 8 HOUR ARTHRITIS PAIN) 650 MG CR tablet Take 650 mg by mouth 2 (two) times daily.      ALPRAZolam (XANAX) 0.5  MG tablet Take 0.5 mg by mouth 3 (three) times daily as needed.     apixaban (ELIQUIS) 5 MG TABS tablet Take 1 tablet (5 mg total) by mouth 2 (two) times daily. NEED APPT FOR FURTHER REFILLS. 60 tablet 1   bifidobacterium infantis (ALIGN) capsule Take 1 capsule by mouth daily. 30 capsule 11   cholecalciferol (VITAMIN D) 1000 UNITS tablet Take 2,000 Units by mouth daily.     co-enzyme Q-10 50 MG capsule Take 50 mg by mouth daily.     cyanocobalamin 1000 MCG tablet Take 1,000 mcg by mouth daily.     diltiazem (CARDIZEM CD) 180 MG 24 hr capsule Take 1 capsule (180 mg total) by mouth 2 (two) times daily. 180 capsule 3   diltiazem (CARDIZEM) 30 MG tablet TAKE 1 TABLET EVERY 4 HOURS AS NEEDED FOR AFIB HEART RATE >100 45 tablet 1   hydrochlorothiazide (HYDRODIURIL) 25 MG tablet TAKE 1 TABLET BY MOUTH EVERY DAY 90 tablet 1   lisinopril (ZESTRIL) 40 MG tablet TAKE 1 TABLET BY MOUTH EVERY DAY 90 tablet 2   potassium chloride (KLOR-CON M10) 10 MEQ tablet Take 1 tablet (10 mEq total) by mouth 3 (three) times daily. 270 tablet 1   pravastatin (PRAVACHOL) 20 MG tablet TAKE 1 TABLET BY MOUTH EVERY DAY 90 tablet 1   traMADol (ULTRAM) 50 MG tablet Take  1 tablet (50 mg total) by mouth every 12 (twelve) hours as needed. 180 tablet 0   No current facility-administered medications for this visit.    Allergies:   Nickel   Social History:  The patient  reports that she has quit smoking. She quit after 1.00 year of use. She has never used smokeless tobacco. She reports that she does not drink alcohol and does not use drugs.   Family History:  The patient's family history includes Heart failure in her mother; Hypertension in her mother; Leukemia in her father.  ROS:  Please see the history of present illness.    All other systems are reviewed and otherwise negative.   PHYSICAL EXAM:  VS:  BP 110/60    Pulse 64    Ht 5\' 4"  (1.626 m)    Wt 164 lb (74.4 kg)    SpO2 97%    BMI 28.15 kg/m  BMI: Body mass  index is 28.15 kg/m. Well nourished, well developed, in no acute distress HEENT: normocephalic, atraumatic Neck: no JVD, carotid bruits or masses Cardiac:  irreg-irreg; no significant murmurs, no rubs, or gallops Lungs:   CTA b/l, no wheezing, rhonchi or rales Abd: soft, nontender MS: no deformity or atrophy Ext: no edema Skin: warm and dry, no rash Neuro:  No gross deficits appreciated Psych: euthymic mood, full affect   EKG:  Done today and reviewed by myself shows  AFib 64bpm, ST/T changes   04/08/2019; Lexiscan stress myoview  Nuclear stress EF: 71%.  There was no ST segment deviation noted during stress.  The study is normal.  This is a low risk study.  The left ventricular ejection fraction is hyperdynamic (>65%).    09/10/2014; TTE Study Conclusions  - Left ventricle: The cavity size was normal. There was mild  concentric hypertrophy. Systolic function was normal. The  estimated ejection fraction was in the range of 60% to 65%. Wall  motion was normal; there were no regional wall motion  abnormalities. The study was not technically sufficient to allow  evaluation of LV diastolic dysfunction due to atrial  fibrillation.  - Aortic valve: Trileaflet; normal thickness leaflets. There was no  regurgitation.  - Aortic root: The aortic root was normal in size.  - Mitral valve: Structurally normal valve.  - Left atrium: The atrium was mildly dilated.  - Right ventricle: The cavity size was normal. Wall thickness was  normal. Systolic function was normal.  - Right atrium: The atrium was normal in size.  - Tricuspid valve: There was mild regurgitation.  - Pulmonic valve: There was trivial regurgitation.  - Pulmonary arteries: Systolic pressure was within the normal  range. PA peak pressure: 32 mm Hg (S).  - Inferior vena cava: The vessel was normal in size.  - Pericardium, extracardiac: There was no pericardial effusion.    Recent Labs: 10/28/2019:  ALT 13; BUN 17; Creatinine, Ser 0.83; Hemoglobin 14.6; Platelets 217.0; Potassium 3.7; Sodium 139  10/28/2019: Cholesterol 198; HDL 56.20; LDL Cholesterol 104; Total CHOL/HDL Ratio 4; Triglycerides 185.0; VLDL 37.0   CrCl cannot be calculated (Patient's most recent lab result is older than the maximum 21 days allowed.).   Wt Readings from Last 3 Encounters:  05/27/20 164 lb (74.4 kg)  10/28/19 171 lb (77.6 kg)  04/25/19 171 lb (77.6 kg)     Other studies reviewed: Additional studies/records reviewed today include: summarized above  ASSESSMENT AND PLAN:  1. Longstanding persistent AFib     CHA2DS2Vasc is 3, on Eliquis,  appropriately dosed     Rate controlled  Her insurance selection/plan choices is coming up, she will check on coverage, she would like to go back to Xarelto if it makes no significant financial difference and will let us know Will update her ;abs today       2. HTN     Looks good, no changes today  3. CP     Not an ongoing symptoms     Very active  4. HLD     Lipids in march are reviewed     Discussed diet, low triglycerides     She follows with her PMD  Disposition: F/u with [place] in a year, sooner if needed  Current medicines are reviewed at length with the patient today.  The patient did not have any concerns regarding medicines.  Venetia Night, PA-C 05/27/2020 8:58 AM     [address] [place] [address] [phone] (office)  [phone] (fax)

## 2020-05-27 ENCOUNTER — Ambulatory Visit (INDEPENDENT_AMBULATORY_CARE_PROVIDER_SITE_OTHER): Payer: Medicare Other | Admitting: Physician Assistant

## 2020-05-27 ENCOUNTER — Other Ambulatory Visit: Payer: Self-pay

## 2020-05-27 VITALS — BP 110/60 | HR 64 | Ht 64.0 in | Wt 164.0 lb

## 2020-05-27 DIAGNOSIS — E785 Hyperlipidemia, unspecified: Secondary | ICD-10-CM | POA: Diagnosis not present

## 2020-05-27 DIAGNOSIS — I4811 Longstanding persistent atrial fibrillation: Secondary | ICD-10-CM | POA: Diagnosis not present

## 2020-05-27 DIAGNOSIS — I10 Essential (primary) hypertension: Secondary | ICD-10-CM | POA: Diagnosis not present

## 2020-05-27 DIAGNOSIS — Z79899 Other long term (current) drug therapy: Secondary | ICD-10-CM

## 2020-05-27 LAB — CBC
Hematocrit: 41.4 % (ref 34.0–46.6)
Hemoglobin: 14.3 g/dL (ref 11.1–15.9)
MCH: 32.9 pg (ref 26.6–33.0)
MCHC: 34.5 g/dL (ref 31.5–35.7)
MCV: 95 fL (ref 79–97)
Platelets: 234 10*3/uL (ref 150–450)
RBC: 4.35 x10E6/uL (ref 3.77–5.28)
RDW: 12.5 % (ref 11.7–15.4)
WBC: 9.3 10*3/uL (ref 3.4–10.8)

## 2020-05-27 LAB — BASIC METABOLIC PANEL
BUN/Creatinine Ratio: 24 (ref 12–28)
BUN: 17 mg/dL (ref 8–27)
CO2: 25 mmol/L (ref 20–29)
Calcium: 9.5 mg/dL (ref 8.7–10.3)
Chloride: 102 mmol/L (ref 96–106)
Creatinine, Ser: 0.72 mg/dL (ref 0.57–1.00)
GFR calc Af Amer: 96 mL/min/{1.73_m2} (ref >59)
GFR calc non Af Amer: 83 mL/min/{1.73_m2} (ref >59)
Glucose: 88 mg/dL (ref 65–99)
Potassium: 3.8 mmol/L (ref 3.5–5.2)
Sodium: 139 mmol/L (ref 134–144)

## 2020-05-27 NOTE — Patient Instructions (Signed)
Medication Instructions:   Your physician recommends that you continue on your current medications as directed. Please refer to the Current Medication list given to you today.   *If you need a refill on your cardiac medications before your next appointment, please call your pharmacy*   Lab Work: BMET AND CBC TODAY   If you have labs (blood work) drawn today and your tests are completely normal, you will receive your results only by:  [place] (if you have MyChart) OR  A paper copy in the mail If you have any lab test that is abnormal or we need to change your treatment, we will call you to review the results.   Testing/Procedures: NONE ORDERED  TODAY   Follow-Up: At [facility], you and your health needs are our priority.  As part of our continuing mission to provide you with exceptional heart care, we have created designated Provider Care Teams.  These Care Teams include your primary Cardiologist (physician) and Advanced Practice Providers (APPs -  Physician Assistants and Nurse Practitioners) who all work together to provide you with the care you need, when you need it.  We recommend signing up for the patient portal called "MyChart".  Sign up information is provided on this After Visit Summary.  MyChart is used to connect with patients for Virtual Visits (Telemedicine).  Patients are able to view lab/test results, encounter notes, upcoming appointments, etc.  Non-urgent messages can be sent to your provider as well.   To learn more about what you can do with MyChart, go to [email or web address].    Your next appointment:   1 year(s)  The format for your next appointment:   In Person  Provider:   You may see Dr. Rayann Heman  or one of the following Advanced Practice Providers on your designated Care Team:    Chanetta Marshall, NP  Tommye Standard, PA-C  Legrand Como "Oda Kilts, [place]    Other Instructions

## 2020-05-28 DIAGNOSIS — Z23 Encounter for immunization: Secondary | ICD-10-CM | POA: Diagnosis not present

## 2020-06-01 ENCOUNTER — Other Ambulatory Visit: Payer: Self-pay | Admitting: Internal Medicine

## 2020-06-01 ENCOUNTER — Ambulatory Visit: Payer: Medicare Other

## 2020-06-02 ENCOUNTER — Other Ambulatory Visit: Payer: Self-pay | Admitting: Internal Medicine

## 2020-06-02 NOTE — Patient Instructions (Addendum)
    Medications reviewed and updated.  Changes include :   Prednisone taper for your arthritis  Your prescription(s) have been submitted to your pharmacy. Please take as directed and contact our office if you believe you are having problem(s) with the medication(s).     Please followup in 6 months

## 2020-06-02 NOTE — Progress Notes (Signed)
Subjective:    Patient ID: [name], female    DOB: Feb 26, 1946, 74 y.o.   MRN: [id number]  HPI The patient is here for follow up of their chronic medical problems, including htn, afib, hyperlipidemia, prediabets, chronic pain from OA of hips/feet  She is taking all of her medications as prescribed.   . She is exercising regularly - walks, weights  Her hips/feet pain have flared up.  It has been a couple of years since she saw ortho.  They would give her prednisone taper and intraarticular hip injection.     Medications and allergies reviewed with patient and updated if appropriate.  Patient Active Problem List   Diagnosis Date Noted  . B12 deficiency 10/28/2019  . IBS (irritable bowel syndrome) 10/28/2019  . Osteoarthritis of hand 06/05/2019  . Presbycusis of both ears 05/14/2018  . ETD (Eustachian tube dysfunction), bilateral 04/12/2018  . Prediabetes 05/15/2017  . Pain of right heel 01/09/2017  . Primary osteoarthritis of both feet 07/10/2016  . Primary osteoarthritis of both hands 07/10/2016  . Alopecia totalis 06/05/2016  . Dermatochalasis of both upper eyelids 10/12/2015  . H/O dysplastic nevus 08/03/2015  . Obese 10/14/2014  . Degenerative lumbar disc 12/04/2013  . Hematuria, undiagnosed cause 09/02/2013  . Osteopenia 06/11/2013  . Primary generalized (osteo)arthritis 12/18/2011  . Mixed hyperlipidemia 03/25/2009  . Essential hypertension 03/24/2009  . ATRIAL FIBRILLATION 03/24/2009  . Greater trochanteric bursitis of left hip 02/12/2007    Current Outpatient Medications on File Prior to Visit  Medication Sig Dispense Refill  . acetaminophen (TYLENOL 8 HOUR ARTHRITIS PAIN) 650 MG CR tablet Take 650 mg by mouth 2 (two) times daily.     [name] apixaban (ELIQUIS) 5 MG TABS tablet Take 1 tablet (5 mg total) by mouth 2 (two) times daily. NEED APPT FOR FURTHER REFILLS. 60 tablet 1  . bifidobacterium infantis (ALIGN) capsule Take 1 capsule by mouth daily. 30  capsule 11  . cholecalciferol (VITAMIN D) 1000 UNITS tablet Take 2,000 Units by mouth daily.    [name] co-enzyme Q-10 50 MG capsule Take 50 mg by mouth daily.    . cyanocobalamin 1000 MCG tablet Take 1,000 mcg by mouth daily.    [name] diltiazem (CARDIZEM) 30 MG tablet TAKE 1 TABLET EVERY 4 HOURS AS NEEDED FOR AFIB HEART RATE >100 45 tablet 1  . hydrochlorothiazide (HYDRODIURIL) 25 MG tablet TAKE 1 TABLET BY MOUTH EVERY DAY 90 tablet 1  . KLOR-CON M10 10 MEQ tablet TAKE 1 TABLET BY MOUTH 3 TIMES A DAY 270 tablet 1  . lisinopril (ZESTRIL) 40 MG tablet TAKE 1 TABLET BY MOUTH EVERY DAY 90 tablet 2  . pravastatin (PRAVACHOL) 20 MG tablet TAKE 1 TABLET BY MOUTH EVERY DAY 90 tablet 1  . traMADol (ULTRAM) 50 MG tablet Take 1 tablet (50 mg total) by mouth every 12 (twelve) hours as needed. 180 tablet 0  . diltiazem (CARDIZEM CD) 180 MG 24 hr capsule Take 1 capsule (180 mg total) by mouth 2 (two) times daily. 180 capsule 3   No current facility-administered medications on file prior to visit.    Past Medical History:  Diagnosis Date  . ARTHRITIS, HIPS, BILATERAL   . Atrial fibrillation (HCC)    chronic anticoag - Pradaxa  . BURSITIS, SUBTROCHANTERIC   . HYPERTENSION   . MITRAL VALVE PROLAPSE   . Mixed hyperlipidemia     Past Surgical History:  Procedure Laterality Date  . DILATION AND CURETTAGE OF UTERUS    .  TONSILLECTOMY      Social History   Socioeconomic History  . Marital status: Married    Spouse name: Not on file  . Number of children: 1  . Years of education: Not on file  . Highest education level: Not on file  Occupational History  . Occupation: Retired  Tobacco Use  . Smoking status: Former Smoker    Years: 1.00  . Smokeless tobacco: Never Used  . Tobacco comment: social age 73  Vaping Use  . Vaping Use: Never used  Substance and Sexual Activity  . Alcohol use: No  . Drug use: No  . Sexual activity: Yes  Other Topics Concern  . Not on file  Social History Narrative  .  Not on file   Social Determinants of Health   Financial Resource Strain:   . Difficulty of Paying Living Expenses: Not on file  Food Insecurity:   . Worried About Charity fundraiser in the Last Year: Not on file  . Ran Out of Food in the Last Year: Not on file  Transportation Needs:   . Lack of Transportation (Medical): Not on file  . Lack of Transportation (Non-Medical): Not on file  Physical Activity:   . Days of Exercise per Week: Not on file  . Minutes of Exercise per Session: Not on file  Stress:   . Feeling of Stress : Not on file  Social Connections:   . Frequency of Communication with Friends and Family: Not on file  . Frequency of Social Gatherings with Friends and Family: Not on file  . Attends Religious Services: Not on file  . Active Member of Clubs or Organizations: Not on file  . Attends Archivist Meetings: Not on file  . Marital Status: Not on file    Family History  Problem Relation Age of Onset  . Hypertension Mother   . Heart failure Mother   . Leukemia Father     Review of Systems  Constitutional: Negative for chills and fever.  Respiratory: Negative for cough, shortness of breath and wheezing.   Cardiovascular: Negative for chest pain, palpitations and leg swelling.  Musculoskeletal: Positive for arthralgias.  Neurological: Negative for light-headedness and headaches.       Objective:   Vitals:   06/03/20 0752  BP: 112/72  Pulse: 81  Temp: 98 F (36.7 C)  SpO2: 99%   BP Readings from Last 3 Encounters:  06/03/20 112/72  05/27/20 110/60  10/28/19 140/82   Wt Readings from Last 3 Encounters:  06/03/20 160 lb (72.6 kg)  05/27/20 164 lb (74.4 kg)  10/28/19 171 lb (77.6 kg)   Body mass index is 27.46 kg/m.   Physical Exam    Constitutional: Appears well-developed and well-nourished. No distress.  HENT:  Head: Normocephalic and atraumatic.  Neck: Neck supple. No tracheal deviation present. No thyromegaly present.  No  cervical lymphadenopathy Cardiovascular: Normal rate, irregular rhythm and normal heart sounds.   No murmur heard. No carotid bruit .  No edema Pulmonary/Chest: Effort normal and breath sounds normal. No respiratory distress. No has no wheezes. No rales.  Skin: Skin is warm and dry. Not diaphoretic.  Psychiatric: Normal mood and affect. Behavior is normal.      Assessment & Plan:    See Problem List for Assessment and Plan of chronic medical problems.    This visit occurred during the SARS-CoV-2 public health emergency.  Safety protocols were in place, including screening questions prior to the visit, additional usage  of staff PPE, and extensive cleaning of exam room while observing appropriate contact time as indicated for disinfecting solutions.

## 2020-06-03 ENCOUNTER — Encounter: Payer: Self-pay | Admitting: Internal Medicine

## 2020-06-03 ENCOUNTER — Other Ambulatory Visit: Payer: Self-pay

## 2020-06-03 ENCOUNTER — Ambulatory Visit (INDEPENDENT_AMBULATORY_CARE_PROVIDER_SITE_OTHER): Payer: Medicare Other | Admitting: Internal Medicine

## 2020-06-03 VITALS — BP 112/72 | HR 81 | Temp 98.0°F | Ht 64.0 in | Wt 160.0 lb

## 2020-06-03 DIAGNOSIS — R7303 Prediabetes: Secondary | ICD-10-CM

## 2020-06-03 DIAGNOSIS — I10 Essential (primary) hypertension: Secondary | ICD-10-CM | POA: Diagnosis not present

## 2020-06-03 DIAGNOSIS — M150 Primary generalized (osteo)arthritis: Secondary | ICD-10-CM

## 2020-06-03 DIAGNOSIS — E782 Mixed hyperlipidemia: Secondary | ICD-10-CM

## 2020-06-03 DIAGNOSIS — I4821 Permanent atrial fibrillation: Secondary | ICD-10-CM | POA: Diagnosis not present

## 2020-06-03 MED ORDER — PREDNISONE 10 MG PO TABS: ORAL_TABLET | ORAL | 0 refills | Status: DC

## 2020-06-03 NOTE — Assessment & Plan Note (Signed)
Chronic Lipids well controlled Continue pravastatin 20 mg daily Regular exercise and healthy diet encouraged

## 2020-06-03 NOTE — Assessment & Plan Note (Addendum)
Chronic Flare of arthritis - will treat with prednisone taper 40 mg x 2 days, then taper by 10 mg every 2 days Continue tramadol 50 mg Q12 hrs

## 2020-06-03 NOTE — Assessment & Plan Note (Addendum)
Chronic Following with cardiology Asymptomatic, rate controlled On eliquis, diltiazem

## 2020-06-03 NOTE — Assessment & Plan Note (Signed)
Chronic Have been in normal range Low sugar / carb diet Stressed regular exercise

## 2020-06-03 NOTE — Assessment & Plan Note (Signed)
Chronic BP well controlled Continue diltiazem 180 mg daily, hctz 25 mg, lisinopril 40 mg  cmp

## 2020-06-17 DIAGNOSIS — Z6828 Body mass index (BMI) 28.0-28.9, adult: Secondary | ICD-10-CM | POA: Diagnosis not present

## 2020-06-17 DIAGNOSIS — Z01419 Encounter for gynecological examination (general) (routine) without abnormal findings: Secondary | ICD-10-CM | POA: Diagnosis not present

## 2020-06-24 ENCOUNTER — Other Ambulatory Visit (HOSPITAL_COMMUNITY): Payer: Self-pay | Admitting: Nurse Practitioner

## 2020-06-24 NOTE — Telephone Encounter (Signed)
Eliquis 5mg  refill request received. Patient is 74 years old, weight-72.6kg, Crea-0.72 on 05/27/2020, Diagnosis-Afib, and last seen by Tommye Standard on 05/27/20. Dose is appropriate based on dosing criteria. Will send in refill to requested pharmacy.

## 2020-07-15 ENCOUNTER — Ambulatory Visit: Payer: Medicare Other

## 2020-07-22 DIAGNOSIS — L821 Other seborrheic keratosis: Secondary | ICD-10-CM | POA: Diagnosis not present

## 2020-07-22 DIAGNOSIS — L309 Dermatitis, unspecified: Secondary | ICD-10-CM | POA: Diagnosis not present

## 2020-07-22 DIAGNOSIS — Z23 Encounter for immunization: Secondary | ICD-10-CM | POA: Diagnosis not present

## 2020-07-22 DIAGNOSIS — D485 Neoplasm of uncertain behavior of skin: Secondary | ICD-10-CM | POA: Diagnosis not present

## 2020-07-22 DIAGNOSIS — D2371 Other benign neoplasm of skin of right lower limb, including hip: Secondary | ICD-10-CM | POA: Diagnosis not present

## 2020-07-22 DIAGNOSIS — L578 Other skin changes due to chronic exposure to nonionizing radiation: Secondary | ICD-10-CM | POA: Diagnosis not present

## 2020-07-22 DIAGNOSIS — D225 Melanocytic nevi of trunk: Secondary | ICD-10-CM | POA: Diagnosis not present

## 2020-07-22 DIAGNOSIS — D2262 Melanocytic nevi of left upper limb, including shoulder: Secondary | ICD-10-CM | POA: Diagnosis not present

## 2020-07-22 DIAGNOSIS — Z86018 Personal history of other benign neoplasm: Secondary | ICD-10-CM | POA: Diagnosis not present

## 2020-07-23 ENCOUNTER — Other Ambulatory Visit: Payer: Self-pay | Admitting: *Deleted

## 2020-07-23 ENCOUNTER — Telehealth: Payer: Self-pay

## 2020-07-23 NOTE — Telephone Encounter (Signed)
Pt called and said she would like to switch from Eliquis to Xarelto. It is mentioned in her last o/v with Renee. She would like this Rx sent to [facility] on [address]. If you have any questions or concerns you can call her.

## 2020-07-26 MED ORDER — RIVAROXABAN 20 MG PO TABS: 20.0000 mg | ORAL_TABLET | Freq: Every day | ORAL | 1 refills | Status: DC

## 2020-07-26 NOTE — Progress Notes (Signed)
Spoke with patient and patient aware of instructions with Xarelto. Patient has took morning Eliquis dose and will take evening in dose and start tomorrow will start  Xaretlo 20 mg once a day

## 2020-07-27 DIAGNOSIS — M1712 Unilateral primary osteoarthritis, left knee: Secondary | ICD-10-CM | POA: Diagnosis not present

## 2020-08-05 DIAGNOSIS — Z7689 Persons encountering health services in other specified circumstances: Secondary | ICD-10-CM | POA: Diagnosis not present

## 2020-08-05 DIAGNOSIS — Z1231 Encounter for screening mammogram for malignant neoplasm of breast: Secondary | ICD-10-CM | POA: Diagnosis not present

## 2020-08-05 DIAGNOSIS — M8588 Other specified disorders of bone density and structure, other site: Secondary | ICD-10-CM | POA: Diagnosis not present

## 2020-08-05 DIAGNOSIS — N950 Postmenopausal bleeding: Secondary | ICD-10-CM | POA: Diagnosis not present

## 2020-08-05 DIAGNOSIS — N958 Other specified menopausal and perimenopausal disorders: Secondary | ICD-10-CM | POA: Diagnosis not present

## 2020-08-08 ENCOUNTER — Other Ambulatory Visit: Payer: Self-pay | Admitting: Internal Medicine

## 2020-08-12 DIAGNOSIS — E559 Vitamin D deficiency, unspecified: Secondary | ICD-10-CM | POA: Diagnosis not present

## 2020-08-12 DIAGNOSIS — M7062 Trochanteric bursitis, left hip: Secondary | ICD-10-CM | POA: Diagnosis not present

## 2020-08-12 DIAGNOSIS — R5383 Other fatigue: Secondary | ICD-10-CM | POA: Diagnosis not present

## 2020-08-12 DIAGNOSIS — M2550 Pain in unspecified joint: Secondary | ICD-10-CM | POA: Diagnosis not present

## 2020-08-17 DIAGNOSIS — M9904 Segmental and somatic dysfunction of sacral region: Secondary | ICD-10-CM | POA: Diagnosis not present

## 2020-08-17 DIAGNOSIS — M170 Bilateral primary osteoarthritis of knee: Secondary | ICD-10-CM | POA: Diagnosis not present

## 2020-08-17 DIAGNOSIS — M25552 Pain in left hip: Secondary | ICD-10-CM | POA: Diagnosis not present

## 2020-08-17 DIAGNOSIS — M9903 Segmental and somatic dysfunction of lumbar region: Secondary | ICD-10-CM | POA: Diagnosis not present

## 2020-08-17 DIAGNOSIS — R531 Weakness: Secondary | ICD-10-CM | POA: Diagnosis not present

## 2020-08-17 DIAGNOSIS — M9906 Segmental and somatic dysfunction of lower extremity: Secondary | ICD-10-CM | POA: Diagnosis not present

## 2020-08-17 DIAGNOSIS — M9905 Segmental and somatic dysfunction of pelvic region: Secondary | ICD-10-CM | POA: Diagnosis not present

## 2020-08-18 DIAGNOSIS — R531 Weakness: Secondary | ICD-10-CM | POA: Diagnosis not present

## 2020-08-18 DIAGNOSIS — M9905 Segmental and somatic dysfunction of pelvic region: Secondary | ICD-10-CM | POA: Diagnosis not present

## 2020-08-18 DIAGNOSIS — M9904 Segmental and somatic dysfunction of sacral region: Secondary | ICD-10-CM | POA: Diagnosis not present

## 2020-08-18 DIAGNOSIS — M25552 Pain in left hip: Secondary | ICD-10-CM | POA: Diagnosis not present

## 2020-08-18 DIAGNOSIS — M79651 Pain in right thigh: Secondary | ICD-10-CM | POA: Diagnosis not present

## 2020-08-18 DIAGNOSIS — M25551 Pain in right hip: Secondary | ICD-10-CM | POA: Diagnosis not present

## 2020-08-18 DIAGNOSIS — M9906 Segmental and somatic dysfunction of lower extremity: Secondary | ICD-10-CM | POA: Diagnosis not present

## 2020-08-18 DIAGNOSIS — M9903 Segmental and somatic dysfunction of lumbar region: Secondary | ICD-10-CM | POA: Diagnosis not present

## 2020-08-27 ENCOUNTER — Encounter: Payer: Self-pay | Admitting: Internal Medicine

## 2020-09-20 DIAGNOSIS — N950 Postmenopausal bleeding: Secondary | ICD-10-CM | POA: Diagnosis not present

## 2020-09-21 DIAGNOSIS — M9903 Segmental and somatic dysfunction of lumbar region: Secondary | ICD-10-CM | POA: Diagnosis not present

## 2020-09-21 DIAGNOSIS — M25552 Pain in left hip: Secondary | ICD-10-CM | POA: Diagnosis not present

## 2020-09-21 DIAGNOSIS — M9905 Segmental and somatic dysfunction of pelvic region: Secondary | ICD-10-CM | POA: Diagnosis not present

## 2020-09-21 DIAGNOSIS — M9904 Segmental and somatic dysfunction of sacral region: Secondary | ICD-10-CM | POA: Diagnosis not present

## 2020-09-21 DIAGNOSIS — M9906 Segmental and somatic dysfunction of lower extremity: Secondary | ICD-10-CM | POA: Diagnosis not present

## 2020-09-21 DIAGNOSIS — M25551 Pain in right hip: Secondary | ICD-10-CM | POA: Diagnosis not present

## 2020-09-22 ENCOUNTER — Other Ambulatory Visit: Payer: Self-pay | Admitting: Obstetrics & Gynecology

## 2020-09-22 DIAGNOSIS — N950 Postmenopausal bleeding: Secondary | ICD-10-CM | POA: Diagnosis not present

## 2020-09-22 DIAGNOSIS — N841 Polyp of cervix uteri: Secondary | ICD-10-CM | POA: Diagnosis not present

## 2020-09-22 DIAGNOSIS — N840 Polyp of corpus uteri: Secondary | ICD-10-CM | POA: Diagnosis not present

## 2020-09-22 DIAGNOSIS — D259 Leiomyoma of uterus, unspecified: Secondary | ICD-10-CM | POA: Diagnosis not present

## 2020-09-30 DIAGNOSIS — M25552 Pain in left hip: Secondary | ICD-10-CM | POA: Diagnosis not present

## 2020-09-30 DIAGNOSIS — M9904 Segmental and somatic dysfunction of sacral region: Secondary | ICD-10-CM | POA: Diagnosis not present

## 2020-09-30 DIAGNOSIS — M25551 Pain in right hip: Secondary | ICD-10-CM | POA: Diagnosis not present

## 2020-09-30 DIAGNOSIS — M9905 Segmental and somatic dysfunction of pelvic region: Secondary | ICD-10-CM | POA: Diagnosis not present

## 2020-09-30 DIAGNOSIS — M9903 Segmental and somatic dysfunction of lumbar region: Secondary | ICD-10-CM | POA: Diagnosis not present

## 2020-09-30 DIAGNOSIS — M9906 Segmental and somatic dysfunction of lower extremity: Secondary | ICD-10-CM | POA: Diagnosis not present

## 2020-10-02 ENCOUNTER — Other Ambulatory Visit: Payer: Self-pay | Admitting: Internal Medicine

## 2020-10-11 ENCOUNTER — Telehealth: Payer: Self-pay | Admitting: Internal Medicine

## 2020-10-11 NOTE — Telephone Encounter (Signed)
LVM for pt to rtn my call to schedule AWV with [facility]. Please schedule appt if pt calls the office.  

## 2020-11-02 DIAGNOSIS — N390 Urinary tract infection, site not specified: Secondary | ICD-10-CM | POA: Diagnosis not present

## 2020-11-02 DIAGNOSIS — R309 Painful micturition, unspecified: Secondary | ICD-10-CM | POA: Diagnosis not present

## 2020-11-08 ENCOUNTER — Other Ambulatory Visit: Payer: Self-pay | Admitting: Internal Medicine

## 2020-11-08 DIAGNOSIS — J3489 Other specified disorders of nose and nasal sinuses: Secondary | ICD-10-CM | POA: Insufficient documentation

## 2020-11-09 DIAGNOSIS — M9905 Segmental and somatic dysfunction of pelvic region: Secondary | ICD-10-CM | POA: Diagnosis not present

## 2020-11-09 DIAGNOSIS — M9903 Segmental and somatic dysfunction of lumbar region: Secondary | ICD-10-CM | POA: Diagnosis not present

## 2020-11-09 DIAGNOSIS — M25551 Pain in right hip: Secondary | ICD-10-CM | POA: Diagnosis not present

## 2020-11-09 DIAGNOSIS — R531 Weakness: Secondary | ICD-10-CM | POA: Diagnosis not present

## 2020-11-09 DIAGNOSIS — M9906 Segmental and somatic dysfunction of lower extremity: Secondary | ICD-10-CM | POA: Diagnosis not present

## 2020-11-09 DIAGNOSIS — M9904 Segmental and somatic dysfunction of sacral region: Secondary | ICD-10-CM | POA: Diagnosis not present

## 2020-11-09 DIAGNOSIS — M79651 Pain in right thigh: Secondary | ICD-10-CM | POA: Diagnosis not present

## 2020-11-15 DIAGNOSIS — M25551 Pain in right hip: Secondary | ICD-10-CM | POA: Diagnosis not present

## 2020-11-15 DIAGNOSIS — M25651 Stiffness of right hip, not elsewhere classified: Secondary | ICD-10-CM | POA: Diagnosis not present

## 2020-11-16 NOTE — Progress Notes (Signed)
Subjective:    Patient ID: [name], female    DOB: [birth year], 75 y.o.   MRN: [id number]  HPI The patient is here for follow up of their chronic medical problems, including htn, afib, hyperlipidemia, prediabetes, chronic OA pain ( hips/feet)  She does aerobic classes.  She is having knee and hip pain.   She is close to bone on bone in her knee.    Hands can be shaky first thing in the morning and sometime during the day and does not feel well at that time.   Medications and allergies reviewed with patient and updated if appropriate.  Patient Active Problem List   Diagnosis Date Noted  . Secondary hypercoagulable state ([place]) 11/17/2020  . B12 deficiency 10/28/2019  . IBS (irritable bowel syndrome) 10/28/2019  . Osteoarthritis of hand 06/05/2019  . Presbycusis of both ears 05/14/2018  . ETD (Eustachian tube dysfunction), bilateral 04/12/2018  . Prediabetes 05/15/2017  . Pain of right heel 01/09/2017  . Primary osteoarthritis of both feet 07/10/2016  . Primary osteoarthritis of both hands 07/10/2016  . Alopecia totalis 06/05/2016  . Dermatochalasis of both upper eyelids 10/12/2015  . H/O dysplastic nevus 08/03/2015  . Obese 10/14/2014  . Degenerative lumbar disc 12/04/2013  . Hematuria, undiagnosed cause 09/02/2013  . Osteopenia 06/11/2013  . Primary generalized (osteo)arthritis 12/18/2011  . Mixed hyperlipidemia 03/25/2009  . Essential hypertension 03/24/2009  . ATRIAL FIBRILLATION 03/24/2009  . Greater trochanteric bursitis of left hip 02/12/2007    Current Outpatient Medications on File Prior to Visit  Medication Sig Dispense Refill  . acetaminophen (TYLENOL) 650 MG CR tablet Take 650 mg by mouth 2 (two) times daily.     . bifidobacterium infantis (ALIGN) capsule Take 1 capsule by mouth daily. 30 capsule 11  . cholecalciferol (VITAMIN D) 1000 UNITS tablet Take 2,000 Units by mouth daily.    [name] co-enzyme Q-10 50 MG capsule Take 50 mg by mouth daily.    .  cyanocobalamin 1000 MCG tablet Take 1,000 mcg by mouth daily.    [name] diltiazem (CARDIZEM CD) 180 MG 24 hr capsule TAKE 1 CAPSULE BY MOUTH TWICE A DAY 180 capsule 2  . diltiazem (CARDIZEM) 30 MG tablet TAKE 1 TABLET EVERY 4 HOURS AS NEEDED FOR AFIB HEART RATE >100 45 tablet 1  . estradiol (ESTRACE) 1 MG tablet Take 1 mg by mouth daily.    . hydrochlorothiazide (HYDRODIURIL) 25 MG tablet TAKE 1 TABLET BY MOUTH EVERY DAY 90 tablet 1  . KLOR-CON M10 10 MEQ tablet TAKE 1 TABLET BY MOUTH 3 TIMES A DAY 270 tablet 1  . lisinopril (ZESTRIL) 40 MG tablet TAKE 1 TABLET BY MOUTH EVERY DAY 90 tablet 2  . pravastatin (PRAVACHOL) 20 MG tablet TAKE 1 TABLET BY MOUTH EVERY DAY 90 tablet 1  . rivaroxaban (XARELTO) 20 MG TABS tablet Take 1 tablet (20 mg total) by mouth daily with supper. 90 tablet 1  . traMADol (ULTRAM) 50 MG tablet TAKE 1 TABLET (50 MG TOTAL) BY MOUTH EVERY 12 (TWELVE) HOURS AS NEEDED. 180 tablet 0  . triamcinolone cream (KENALOG) 0.1 % Apply topically 3 (three) times daily as needed.    . progesterone (PROMETRIUM) 100 MG capsule Take 100 mg by mouth daily.     No current facility-administered medications on file prior to visit.    Past Medical History:  Diagnosis Date  . ARTHRITIS, HIPS, BILATERAL   . Atrial fibrillation (HCC)    chronic anticoag - Pradaxa  . BURSITIS,  SUBTROCHANTERIC   . HYPERTENSION   . MITRAL VALVE PROLAPSE   . Mixed hyperlipidemia     Past Surgical History:  Procedure Laterality Date  . DILATION AND CURETTAGE OF UTERUS    . TONSILLECTOMY      Social History   Socioeconomic History  . Marital status: Married    Spouse name: Not on file  . Number of children: 1  . Years of education: Not on file  . Highest education level: Not on file  Occupational History  . Occupation: Retired  Tobacco Use  . Smoking status: Former Smoker    Years: 1.00  . Smokeless tobacco: Never Used  . Tobacco comment: social age 42  Vaping Use  . Vaping Use: Never used   Substance and Sexual Activity  . Alcohol use: No  . Drug use: No  . Sexual activity: Yes  Other Topics Concern  . Not on file  Social History Narrative  . Not on file   Social Determinants of Health   Financial Resource Strain: Not on file  Food Insecurity: Not on file  Transportation Needs: Not on file  Physical Activity: Not on file  Stress: Not on file  Social Connections: Not on file    Family History  Problem Relation Age of Onset  . Hypertension Mother   . Heart failure Mother   . Leukemia Father     Review of Systems  Constitutional: Negative for chills and fever.  Respiratory: Negative for cough, shortness of breath and wheezing.   Cardiovascular: Negative for chest pain, palpitations and leg swelling.  Genitourinary: Negative for dysuria, frequency and hematuria.       Objective:   Vitals:   11/17/20 0757  BP: 112/78  Pulse: 100  Temp: 98 F (36.7 C)  SpO2: 97%   BP Readings from Last 3 Encounters:  11/17/20 112/78  06/03/20 112/72  05/27/20 110/60   Wt Readings from Last 3 Encounters:  11/17/20 156 lb 3.2 oz (70.9 kg)  06/03/20 160 lb (72.6 kg)  05/27/20 164 lb (74.4 kg)   Body mass index is 26.81 kg/m.  Depression screen [facility] 2/9 11/17/2020 06/20/2019 06/20/2019 04/18/2018 05/15/2017  Decreased Interest 0 0 0 0 0  Down, Depressed, Hopeless 0 0 0 0 0  PHQ - 2 Score 0 0 0 0 0  Altered sleeping 1 - - - -  Tired, decreased energy 1 - - - -  Change in appetite 0 - - - -  Feeling bad or failure about yourself  0 - - - -  Trouble concentrating 1 - - - -  Moving slowly or fidgety/restless 0 - - - -  Suicidal thoughts 0 - - - -  PHQ-9 Score 3 - - - -  Difficult doing work/chores Not difficult at all - - - -  Some recent data might be hidden    GAD 7 : Generalized Anxiety Score 11/17/2020  Nervous, Anxious, on Edge 2  Control/stop worrying 0  Worry too much - different things 1  Trouble relaxing 0  Restless 0  Easily annoyed or irritable 0   Afraid - awful might happen 0  Total GAD 7 Score 3  Anxiety Difficulty Not difficult at all        Physical Exam    Constitutional: Appears well-developed and well-nourished. No distress.  HENT:  Head: Normocephalic and atraumatic.  Neck: Neck supple. No tracheal deviation present. No thyromegaly present.  No cervical lymphadenopathy Cardiovascular: Normal rate, regular rhythm and normal  heart sounds.   No murmur heard. No carotid bruit .  No edema Pulmonary/Chest: Effort normal and breath sounds normal. No respiratory distress. No has no wheezes. No rales.  Skin: Skin is warm and dry. Not diaphoretic.  Psychiatric: Normal mood and affect. Behavior is normal.      Assessment & Plan:    Screened for depression using the PHQ 9 scale.  No evidence of depression.   Screened for anxiety using GAD7 Scale.  She has mild anxiety, but does not feels she needs anything for it.    See Problem List for Assessment and Plan of chronic medical problems.    This visit occurred during the SARS-CoV-2 public health emergency.  Safety protocols were in place, including screening questions prior to the visit, additional usage of staff PPE, and extensive cleaning of exam room while observing appropriate contact time as indicated for disinfecting solutions.

## 2020-11-16 NOTE — Patient Instructions (Addendum)
    Blood work was ordered.      Medications changes include :   none     Please followup in 6 months  

## 2020-11-17 ENCOUNTER — Ambulatory Visit (INDEPENDENT_AMBULATORY_CARE_PROVIDER_SITE_OTHER): Payer: Medicare Other | Admitting: Internal Medicine

## 2020-11-17 ENCOUNTER — Encounter: Payer: Self-pay | Admitting: Internal Medicine

## 2020-11-17 ENCOUNTER — Other Ambulatory Visit: Payer: Self-pay

## 2020-11-17 VITALS — BP 112/78 | HR 100 | Temp 98.0°F | Ht 64.0 in | Wt 156.2 lb

## 2020-11-17 DIAGNOSIS — D6869 Other thrombophilia: Secondary | ICD-10-CM | POA: Diagnosis not present

## 2020-11-17 DIAGNOSIS — I10 Essential (primary) hypertension: Secondary | ICD-10-CM

## 2020-11-17 DIAGNOSIS — R7303 Prediabetes: Secondary | ICD-10-CM

## 2020-11-17 DIAGNOSIS — I4821 Permanent atrial fibrillation: Secondary | ICD-10-CM

## 2020-11-17 DIAGNOSIS — E538 Deficiency of other specified B group vitamins: Secondary | ICD-10-CM

## 2020-11-17 DIAGNOSIS — E782 Mixed hyperlipidemia: Secondary | ICD-10-CM

## 2020-11-17 LAB — VITAMIN B12: Vitamin B-12: 922 pg/mL — ABNORMAL HIGH (ref 211–911)

## 2020-11-17 LAB — CBC WITH DIFFERENTIAL/PLATELET
Basophils Absolute: 0.1 10*3/uL (ref 0.0–0.1)
Basophils Relative: 0.8 % (ref 0.0–3.0)
Eosinophils Absolute: 0.1 10*3/uL (ref 0.0–0.7)
Eosinophils Relative: 0.6 % (ref 0.0–5.0)
HCT: 45.2 % (ref 36.0–46.0)
Hemoglobin: 15.2 g/dL — ABNORMAL HIGH (ref 12.0–15.0)
Lymphocytes Relative: 26.4 % (ref 12.0–46.0)
Lymphs Abs: 2.4 10*3/uL (ref 0.7–4.0)
MCHC: 33.7 g/dL (ref 30.0–36.0)
MCV: 98.8 fl (ref 78.0–100.0)
Monocytes Absolute: 0.8 10*3/uL (ref 0.1–1.0)
Monocytes Relative: 9.1 % (ref 3.0–12.0)
Neutro Abs: 5.8 10*3/uL (ref 1.4–7.7)
Neutrophils Relative %: 63.1 % (ref 43.0–77.0)
Platelets: 250 10*3/uL (ref 150.0–400.0)
RBC: 4.57 Mil/uL (ref 3.87–5.11)
RDW: 14.5 % (ref 11.5–15.5)
WBC: 9.2 10*3/uL (ref 4.0–10.5)

## 2020-11-17 LAB — COMPREHENSIVE METABOLIC PANEL
ALT: 13 U/L (ref 0–35)
AST: 14 U/L (ref 0–37)
Albumin: 4.2 g/dL (ref 3.5–5.2)
Alkaline Phosphatase: 64 U/L (ref 39–117)
BUN: 23 mg/dL (ref 6–23)
CO2: 30 mEq/L (ref 19–32)
Calcium: 10.3 mg/dL (ref 8.4–10.5)
Chloride: 103 mEq/L (ref 96–112)
Creatinine, Ser: 0.84 mg/dL (ref 0.40–1.20)
GFR: 68.8 mL/min (ref >60.00)
Glucose, Bld: 81 mg/dL (ref 70–99)
Potassium: 4.2 mEq/L (ref 3.5–5.1)
Sodium: 141 mEq/L (ref 135–145)
Total Bilirubin: 0.6 mg/dL (ref 0.2–1.2)
Total Protein: 7.2 g/dL (ref 6.0–8.3)

## 2020-11-17 LAB — LIPID PANEL
Cholesterol: 204 mg/dL — ABNORMAL HIGH (ref 0–200)
HDL: 64.7 mg/dL (ref >39.00)
LDL Cholesterol: 109 mg/dL — ABNORMAL HIGH (ref 0–99)
NonHDL: 139.08
Total CHOL/HDL Ratio: 3
Triglycerides: 148 mg/dL (ref 0.0–149.0)
VLDL: 29.6 mg/dL (ref 0.0–40.0)

## 2020-11-17 LAB — HEMOGLOBIN A1C: Hgb A1c MFr Bld: 5.2 % (ref 4.6–6.5)

## 2020-11-17 LAB — TSH: TSH: 1.71 u[IU]/mL (ref 0.35–4.50)

## 2020-11-17 NOTE — Assessment & Plan Note (Signed)
Chronic Check a1c Low sugar / carb diet Stressed regular exercise  

## 2020-11-17 NOTE — Assessment & Plan Note (Signed)
Chronic Taking B12 daily Check B12 level 

## 2020-11-17 NOTE — Assessment & Plan Note (Signed)
Chronic On xarelto for afib

## 2020-11-17 NOTE — Assessment & Plan Note (Signed)
Chronic BP well controlled Continue cardizem 180 mg bid, hctz 25 mg qd, lisinopril 40 mg qd cmp

## 2020-11-17 NOTE — Assessment & Plan Note (Addendum)
Chronic Following with cardiology Having intermittent hand shaking and not feeling well - afib with higher HR  - this is new On xarelto, diltiazem daily and prn  - has been using the prn dilt more Will start monitoring HR more and see if it is when HR is high and contact cardiology  - may need medication change Cmp, cbc, tsh

## 2020-11-17 NOTE — Assessment & Plan Note (Signed)
Chronic Check lipid panel  Continue pravastatin 20 mg daily Regular exercise and healthy diet encouraged  

## 2020-11-17 NOTE — Addendum Note (Signed)
Addended by: Boris Lown B on: 11/17/2020 08:38 AM   Modules accepted: Orders

## 2020-11-18 DIAGNOSIS — M25651 Stiffness of right hip, not elsewhere classified: Secondary | ICD-10-CM | POA: Diagnosis not present

## 2020-11-18 DIAGNOSIS — M25551 Pain in right hip: Secondary | ICD-10-CM | POA: Diagnosis not present

## 2020-11-22 DIAGNOSIS — M25651 Stiffness of right hip, not elsewhere classified: Secondary | ICD-10-CM | POA: Diagnosis not present

## 2020-11-22 DIAGNOSIS — M25551 Pain in right hip: Secondary | ICD-10-CM | POA: Diagnosis not present

## 2020-11-22 NOTE — Progress Notes (Signed)
Cardiology Office Note Date:  11/22/2020  Patient ID:  Yuridia, Couts [birth year], MRN [id number] PCP:  Binnie Rail, MD  Cardiologist/Electrophysiologist: Dr. Rayann Heman     Chief Complaint: 6 mo  History of Present Illness: CHASITY OUTTEN is a 75 y.o. female with history of AFib, HTN.  She comes in today to be seen for Dr. Rayann Heman.  Last seen by him via a tele health visit Aug 2020.  Doing well with rate controlled AFib for years.  Mentioned some CP that she suspected was musculoskeletal s/s workout at the gym, but worried her, planned for stress testing. She had f/o with Genene Churn, NP in Sept.  She was very active without CP, her myoview low risk.  CP felt to be very atypical and pt suspected stress/anxiety related.  I saw her 05/27/2020 She is doing great.  Her and her husband have been traveling, just back from [place] 2 mo trip [place] tour, they did 5 national [place].  [facility] will get her a little winded (for years), but reports good exertional caacity, no CP. She infrequently will feel palpitations fast rates and use her PRN dilt dose. No fainting She feels like on Eliquis she tends to bleed a little longer then she did on xarelto.  Stopped easily nough with placing a band aide, no excessive bleeding or unprovoked bleeding.  And as it tuned out not any financial benefit in the switch to Eliquis, (the reason she made the change).  She subsequently had an insurance change is back on Xarelto  TODAY She has noticed a couple times in the last couple months feeling a bit jittery/tremulous and reminds her of how she felt when her Afib first started Last week at her PMD her HR was 100 and this very unusual for her typically always in the 70's though felt fine, and recalls when feeling this symptom her HR was in the 90's She has taken a PRN dilt and her HR did settle and seemed to feel better as well.  No CP, no SOB or DOE No dizziness, near syncope or syncope  She on  xarelto, no bleeding or sigsn of bleeding    Past Medical History:  Diagnosis Date  . ARTHRITIS, HIPS, BILATERAL   . Atrial fibrillation (HCC)    chronic anticoag - Pradaxa  . BURSITIS, SUBTROCHANTERIC   . HYPERTENSION   . MITRAL VALVE PROLAPSE   . Mixed hyperlipidemia     Past Surgical History:  Procedure Laterality Date  . DILATION AND CURETTAGE OF UTERUS    . TONSILLECTOMY      Current Outpatient Medications  Medication Sig Dispense Refill  . acetaminophen (TYLENOL) 650 MG CR tablet Take 650 mg by mouth 2 (two) times daily.     . bifidobacterium infantis (ALIGN) capsule Take 1 capsule by mouth daily. 30 capsule 11  . cholecalciferol (VITAMIN D) 1000 UNITS tablet Take 2,000 Units by mouth daily.    [name] co-enzyme Q-10 50 MG capsule Take 50 mg by mouth daily.    . cyanocobalamin 1000 MCG tablet Take 1,000 mcg by mouth daily.    [name] diltiazem (CARDIZEM CD) 180 MG 24 hr capsule TAKE 1 CAPSULE BY MOUTH TWICE A DAY 180 capsule 2  . diltiazem (CARDIZEM) 30 MG tablet TAKE 1 TABLET EVERY 4 HOURS AS NEEDED FOR AFIB HEART RATE >100 45 tablet 1  . estradiol (ESTRACE) 1 MG tablet Take 1 mg by mouth daily.    . hydrochlorothiazide (HYDRODIURIL) 25  MG tablet TAKE 1 TABLET BY MOUTH EVERY DAY 90 tablet 1  . KLOR-CON M10 10 MEQ tablet TAKE 1 TABLET BY MOUTH 3 TIMES A DAY 270 tablet 1  . lisinopril (ZESTRIL) 40 MG tablet TAKE 1 TABLET BY MOUTH EVERY DAY 90 tablet 2  . pravastatin (PRAVACHOL) 20 MG tablet TAKE 1 TABLET BY MOUTH EVERY DAY 90 tablet 1  . progesterone (PROMETRIUM) 100 MG capsule Take 100 mg by mouth daily.    . rivaroxaban (XARELTO) 20 MG TABS tablet Take 1 tablet (20 mg total) by mouth daily with supper. 90 tablet 1  . traMADol (ULTRAM) 50 MG tablet TAKE 1 TABLET (50 MG TOTAL) BY MOUTH EVERY 12 (TWELVE) HOURS AS NEEDED. 180 tablet 0  . triamcinolone cream (KENALOG) 0.1 % Apply topically 3 (three) times daily as needed.     No current facility-administered medications for this visit.     Allergies:   Nickel   Social History:  The patient  reports that she has quit smoking. She quit after 1.00 year of use. She has never used smokeless tobacco. She reports that she does not drink alcohol and does not use drugs.   Family History:  The patient's family history includes Heart failure in her mother; Hypertension in her mother; Leukemia in her father.  ROS:  Please see the history of present illness.    All other systems are reviewed and otherwise negative.   PHYSICAL EXAM:  VS:  There were no vitals taken for this visit. BMI: There is no height or weight on file to calculate BMI. Well nourished, well developed, in no acute distress HEENT: normocephalic, atraumatic Neck: no JVD, carotid bruits or masses Cardiac: irreg-irreg; no significant murmurs, no rubs, or gallops Lungs:   CTA b/l, no wheezing, rhonchi or rales Abd: soft, nontender MS: no deformity or atrophy Ext: no edema Skin: warm and dry, no rash Neuro:  No gross deficits appreciated Psych: euthymic mood, full affect   EKG:  Done today and reviewed by myself AFib 83bpm, no changes   04/08/2019; Lexiscan stress myoview  Nuclear stress EF: 71%.  There was no ST segment deviation noted during stress.  The study is normal.  This is a low risk study.  The left ventricular ejection fraction is hyperdynamic (>65%).    09/10/2014; TTE Study Conclusions  - Left ventricle: The cavity size was normal. There was mild  concentric hypertrophy. Systolic function was normal. The  estimated ejection fraction was in the range of 60% to 65%. Wall  motion was normal; there were no regional wall motion  abnormalities. The study was not technically sufficient to allow  evaluation of LV diastolic dysfunction due to atrial  fibrillation.  - Aortic valve: Trileaflet; normal thickness leaflets. There was no  regurgitation.  - Aortic root: The aortic root was normal in size.  - Mitral valve: Structurally  normal valve.  - Left atrium: The atrium was mildly dilated.  - Right ventricle: The cavity size was normal. Wall thickness was  normal. Systolic function was normal.  - Right atrium: The atrium was normal in size.  - Tricuspid valve: There was mild regurgitation.  - Pulmonic valve: There was trivial regurgitation.  - Pulmonary arteries: Systolic pressure was within the normal  range. PA peak pressure: 32 mm Hg (S).  - Inferior vena cava: The vessel was normal in size.  - Pericardium, extracardiac: There was no pericardial effusion.    Recent Labs: 11/17/2020: ALT 13; BUN 23; Creatinine, Ser 0.84;  Hemoglobin 15.2; Platelets 250.0; Potassium 4.2; Sodium 141; TSH 1.71  11/17/2020: Cholesterol 204; HDL 64.70; LDL Cholesterol 109; Total CHOL/HDL Ratio 3; Triglycerides 148.0; VLDL 29.6   Estimated Creatinine Clearance: 57.6 mL/min (by C-G formula based on SCr of 0.84 mg/dL).   Wt Readings from Last 3 Encounters:  11/17/20 156 lb 3.2 oz (70.9 kg)  06/03/20 160 lb (72.6 kg)  05/27/20 164 lb (74.4 kg)     Other studies reviewed: Additional studies/records reviewed today include: summarized above  ASSESSMENT AND PLAN:  1. Longstanding persistent AFib     CHA2DS2Vasc is 3, on Xarelto, appropriately dosed     Rate controlled      Discussed use of PRN dilt and safe frequency Not entirely certain or clear that her HR or AFib is the etiology of her episodes of tremoulousness/jittery feeling  She will keep a log Asked to keep hydrated wwell and discuss further with her PMD, she mentions that she has been trying to watch her sugar intake with HGB A1C in the 5s  2. HTN     Looks good, no changes today  3. HLD     Looks OK, discussed LDL could be better     She follows with her PMD  Disposition: we will see her again in 61mo, sooner if needed  Current medicines are reviewed at length with the patient today.  The patient did not have any concerns regarding medicines.  Venetia Night, PA-C 11/22/2020 5:14 PM     [address] [place] [address] [phone] (office)  [phone] (fax)

## 2020-11-23 ENCOUNTER — Encounter: Payer: Self-pay | Admitting: Physician Assistant

## 2020-11-23 ENCOUNTER — Ambulatory Visit (INDEPENDENT_AMBULATORY_CARE_PROVIDER_SITE_OTHER): Payer: Medicare Other | Admitting: Physician Assistant

## 2020-11-23 ENCOUNTER — Other Ambulatory Visit: Payer: Self-pay

## 2020-11-23 VITALS — BP 120/80 | HR 79 | Ht 64.0 in | Wt 155.4 lb

## 2020-11-23 DIAGNOSIS — I4821 Permanent atrial fibrillation: Secondary | ICD-10-CM | POA: Diagnosis not present

## 2020-11-23 DIAGNOSIS — E785 Hyperlipidemia, unspecified: Secondary | ICD-10-CM

## 2020-11-23 DIAGNOSIS — I10 Essential (primary) hypertension: Secondary | ICD-10-CM | POA: Diagnosis not present

## 2020-11-23 NOTE — Patient Instructions (Signed)

## 2020-11-24 NOTE — Addendum Note (Signed)
Addended by: Claude Manges on: 11/24/2020 04:10 PM   Modules accepted: Orders

## 2020-11-25 ENCOUNTER — Other Ambulatory Visit: Payer: Self-pay | Admitting: Internal Medicine

## 2020-11-25 DIAGNOSIS — M25651 Stiffness of right hip, not elsewhere classified: Secondary | ICD-10-CM | POA: Diagnosis not present

## 2020-11-25 DIAGNOSIS — M25551 Pain in right hip: Secondary | ICD-10-CM | POA: Diagnosis not present

## 2020-12-27 ENCOUNTER — Other Ambulatory Visit: Payer: Self-pay | Admitting: Internal Medicine

## 2021-01-04 DIAGNOSIS — R269 Unspecified abnormalities of gait and mobility: Secondary | ICD-10-CM | POA: Diagnosis not present

## 2021-01-04 DIAGNOSIS — M5106 Intervertebral disc disorders with myelopathy, lumbar region: Secondary | ICD-10-CM | POA: Diagnosis not present

## 2021-01-07 ENCOUNTER — Other Ambulatory Visit: Payer: Self-pay

## 2021-01-07 ENCOUNTER — Telehealth: Payer: Self-pay | Admitting: Internal Medicine

## 2021-01-07 ENCOUNTER — Other Ambulatory Visit: Payer: Medicare Other

## 2021-01-07 DIAGNOSIS — R300 Dysuria: Secondary | ICD-10-CM

## 2021-01-07 LAB — URINALYSIS, ROUTINE W REFLEX MICROSCOPIC
Bilirubin Urine: NEGATIVE
Ketones, ur: NEGATIVE
Nitrite: NEGATIVE
Specific Gravity, Urine: 1.015 (ref 1.000–1.030)
Total Protein, Urine: NEGATIVE
Urine Glucose: NEGATIVE
Urobilinogen, UA: 0.2 (ref 0.0–1.0)
pH: 5.5 (ref 5.0–8.0)

## 2021-01-07 MED ORDER — CEPHALEXIN 500 MG PO CAPS: 500.0000 mg | ORAL_CAPSULE | Freq: Two times a day (BID) | ORAL | 0 refills | Status: DC

## 2021-01-07 NOTE — Telephone Encounter (Signed)
   Patient requesting order for urine test; painful urination

## 2021-01-07 NOTE — Telephone Encounter (Signed)
Ok - may not be back by the end of the day.Marland KitchenMarland Kitchen

## 2021-01-07 NOTE — Telephone Encounter (Signed)
Spoke with patient.  Labs ordered today and she has been placed on the lab scheduled.

## 2021-01-10 DIAGNOSIS — M545 Low back pain, unspecified: Secondary | ICD-10-CM | POA: Diagnosis not present

## 2021-01-11 DIAGNOSIS — M47816 Spondylosis without myelopathy or radiculopathy, lumbar region: Secondary | ICD-10-CM | POA: Diagnosis not present

## 2021-01-14 DIAGNOSIS — H52203 Unspecified astigmatism, bilateral: Secondary | ICD-10-CM | POA: Diagnosis not present

## 2021-01-14 DIAGNOSIS — Z961 Presence of intraocular lens: Secondary | ICD-10-CM | POA: Diagnosis not present

## 2021-01-19 ENCOUNTER — Other Ambulatory Visit: Payer: Self-pay | Admitting: Physical Medicine & Rehabilitation

## 2021-01-19 DIAGNOSIS — M5106 Intervertebral disc disorders with myelopathy, lumbar region: Secondary | ICD-10-CM | POA: Diagnosis not present

## 2021-01-19 DIAGNOSIS — M25551 Pain in right hip: Secondary | ICD-10-CM

## 2021-01-20 DIAGNOSIS — M545 Low back pain, unspecified: Secondary | ICD-10-CM | POA: Diagnosis not present

## 2021-01-23 ENCOUNTER — Ambulatory Visit
Admission: RE | Admit: 2021-01-23 | Discharge: 2021-01-23 | Disposition: A | Discharge status: Home / Self Care | Payer: Medicare Other | Source: Ambulatory Visit | Attending: Physical Medicine & Rehabilitation | Admitting: Physical Medicine & Rehabilitation

## 2021-01-23 ENCOUNTER — Other Ambulatory Visit: Payer: Self-pay

## 2021-01-23 DIAGNOSIS — M25551 Pain in right hip: Secondary | ICD-10-CM | POA: Diagnosis not present

## 2021-01-23 DIAGNOSIS — M5137 Other intervertebral disc degeneration, lumbosacral region: Secondary | ICD-10-CM | POA: Diagnosis not present

## 2021-01-23 DIAGNOSIS — M47816 Spondylosis without myelopathy or radiculopathy, lumbar region: Secondary | ICD-10-CM | POA: Diagnosis not present

## 2021-01-24 DIAGNOSIS — M545 Low back pain, unspecified: Secondary | ICD-10-CM | POA: Diagnosis not present

## 2021-01-26 ENCOUNTER — Other Ambulatory Visit: Payer: Self-pay | Admitting: Physician Assistant

## 2021-01-26 DIAGNOSIS — M5106 Intervertebral disc disorders with myelopathy, lumbar region: Secondary | ICD-10-CM | POA: Diagnosis not present

## 2021-01-26 NOTE — Telephone Encounter (Signed)
Xarelto 20mg  refill request received. Pt is 74 years old, weight-70.5kg, Crea- 0.84 on 11/17/2020, last seen by Tommye Standard on 4/19/222, Diagnosis-Afib, CrCl-66.27ml/min; Dose is appropriate based on dosing criteria. Will send in refill to requested pharmacy.

## 2021-01-28 DIAGNOSIS — M545 Low back pain, unspecified: Secondary | ICD-10-CM | POA: Diagnosis not present

## 2021-01-28 DIAGNOSIS — Z23 Encounter for immunization: Secondary | ICD-10-CM | POA: Diagnosis not present

## 2021-01-31 DIAGNOSIS — M545 Low back pain, unspecified: Secondary | ICD-10-CM | POA: Diagnosis not present

## 2021-02-02 ENCOUNTER — Other Ambulatory Visit: Payer: Self-pay | Admitting: Internal Medicine

## 2021-02-03 DIAGNOSIS — M545 Low back pain, unspecified: Secondary | ICD-10-CM | POA: Diagnosis not present

## 2021-02-07 ENCOUNTER — Other Ambulatory Visit: Payer: Self-pay | Admitting: Internal Medicine

## 2021-02-08 DIAGNOSIS — M545 Low back pain, unspecified: Secondary | ICD-10-CM | POA: Diagnosis not present

## 2021-02-09 DIAGNOSIS — M5030 Other cervical disc degeneration, unspecified cervical region: Secondary | ICD-10-CM | POA: Diagnosis not present

## 2021-02-09 DIAGNOSIS — M2550 Pain in unspecified joint: Secondary | ICD-10-CM | POA: Diagnosis not present

## 2021-02-09 DIAGNOSIS — M25512 Pain in left shoulder: Secondary | ICD-10-CM | POA: Diagnosis not present

## 2021-02-09 DIAGNOSIS — M791 Myalgia, unspecified site: Secondary | ICD-10-CM | POA: Diagnosis not present

## 2021-02-09 DIAGNOSIS — R5383 Other fatigue: Secondary | ICD-10-CM | POA: Diagnosis not present

## 2021-02-10 DIAGNOSIS — M545 Low back pain, unspecified: Secondary | ICD-10-CM | POA: Diagnosis not present

## 2021-02-11 DIAGNOSIS — M7061 Trochanteric bursitis, right hip: Secondary | ICD-10-CM | POA: Diagnosis not present

## 2021-02-11 DIAGNOSIS — M5030 Other cervical disc degeneration, unspecified cervical region: Secondary | ICD-10-CM | POA: Diagnosis not present

## 2021-02-16 DIAGNOSIS — M545 Low back pain, unspecified: Secondary | ICD-10-CM | POA: Diagnosis not present

## 2021-02-18 DIAGNOSIS — M545 Low back pain, unspecified: Secondary | ICD-10-CM | POA: Diagnosis not present

## 2021-02-18 DIAGNOSIS — M5030 Other cervical disc degeneration, unspecified cervical region: Secondary | ICD-10-CM | POA: Diagnosis not present

## 2021-03-22 DIAGNOSIS — L0390 Cellulitis, unspecified: Secondary | ICD-10-CM | POA: Diagnosis not present

## 2021-03-30 ENCOUNTER — Telehealth: Payer: Self-pay | Admitting: Internal Medicine

## 2021-03-30 ENCOUNTER — Other Ambulatory Visit: Payer: Self-pay

## 2021-03-30 ENCOUNTER — Telehealth: Payer: Self-pay

## 2021-03-30 MED ORDER — PRAVASTATIN SODIUM 20 MG PO TABS: 20.0000 mg | ORAL_TABLET | Freq: Every day | ORAL | 1 refills | Status: DC

## 2021-03-30 NOTE — Telephone Encounter (Signed)
Faxed in today. 

## 2021-04-01 ENCOUNTER — Encounter: Payer: Self-pay | Admitting: Internal Medicine

## 2021-05-05 ENCOUNTER — Other Ambulatory Visit: Payer: Self-pay | Admitting: Internal Medicine

## 2021-05-22 DIAGNOSIS — Z23 Encounter for immunization: Secondary | ICD-10-CM | POA: Diagnosis not present

## 2021-05-26 NOTE — Progress Notes (Signed)
Cardiology Office Note Date:  05/26/2021  Patient ID:  Alletta, Mattos Dec 22, 1945, MRN [id number] PCP:  Binnie Rail, MD  Cardiologist/Electrophysiologist: Dr. Rayann Heman     Chief Complaint: 6 mo  History of Present Illness: MARYA LOWDEN is a 75 y.o. female with history of AFib, HTN.  She comes in today to be seen for Dr. Rayann Heman.  Last seen by him via a tele health visit Aug 2020.  Doing well with rate controlled AFib for years.  Mentioned some CP that she suspected was musculoskeletal s/s workout at the gym, but worried her, planned for stress testing. She had f/o with Genene Churn, NP in Sept.  She was very active without CP, her myoview low risk.  CP felt to be very atypical and pt suspected stress/anxiety related.  I saw her 05/27/2020 She is doing great.  Her and her husband have been traveling, just back from a 2 mo trip to the west/4[place] tour, they did 5 national parks.  [facility] will get her a little winded (for years), but reports good exertional caacity, no CP. She infrequently will feel palpitations fast rates and use her PRN dilt dose. No fainting She feels like on Eliquis she tends to bleed a little longer then she did on xarelto.  Stopped easily nough with placing a band aide, no excessive bleeding or unprovoked bleeding.  And as it tuned out not any financial benefit in the switch to Eliquis, (the reason she made the change).  She subsequently had an insurance change is back on Xarelto  I aw her 11/23/20 She has noticed a couple times in the last couple months feeling a bit jittery/tremulous and reminds her of how she felt when her Afib first started Last week at her PMD her HR was 100 and this very unusual for her typically always in the 70's though felt fine, and recalls when feeling this symptom her HR was in the 90's She has taken a PRN dilt and her HR did settle and seemed to feel better as well. No CP, no SOB or DOE No dizziness, near syncope or syncope She  on xarelto, no bleeding or sigsn of bleeding Not clear that her symptoms were 2/2 AFib, she was asked to keep a log, stay well hydrated, discussed use of prn dilt and review her symptoms with her PMD as well.  TODAY She is doing well. She and her husband have been traveling in thier RV She stays active has a bad hip and this can get limiting though prior to their last trip she was pulsed with steroid and had an injection and was relatively pain free and able to walk around/explore their destinations Has good exertional capacity. No CP, no SOB She has not been bothered or aware of her AFib, thinks it has been months since she has used a PRN dilt. She says she will feel a little nauseous/anxious feeling in her stomach on occasion, this she attributes to her AFib No palpitations No dizziness, near syncope or syncope. No bleeding or signs of bleeding, bruises easily with minimal trauma arms.  She has a bad hip and asks about using Celebrex   Past Medical History:  Diagnosis Date   ARTHRITIS, HIPS, BILATERAL    Atrial fibrillation ([place])    chronic anticoag - Pradaxa   BURSITIS, SUBTROCHANTERIC    HYPERTENSION    MITRAL VALVE PROLAPSE    Mixed hyperlipidemia     Past Surgical History:  Procedure Laterality Date  DILATION AND CURETTAGE OF UTERUS     TONSILLECTOMY      Current Outpatient Medications  Medication Sig Dispense Refill   acetaminophen (TYLENOL) 650 MG CR tablet Take 650 mg by mouth 2 (two) times daily.      bifidobacterium infantis (ALIGN) capsule Take 1 capsule by mouth daily. 30 capsule 11   cephALEXin (KEFLEX) 500 MG capsule Take 1 capsule (500 mg total) by mouth 2 (two) times daily. 14 capsule 0   cholecalciferol (VITAMIN D) 1000 UNITS tablet Take 2,000 Units by mouth daily.     co-enzyme Q-10 50 MG capsule Take 50 mg by mouth daily.     cyanocobalamin 1000 MCG tablet Take 1,000 mcg by mouth daily.     diltiazem (CARDIZEM CD) 180 MG 24 hr capsule TAKE 1 CAPSULE BY  MOUTH TWICE A DAY 180 capsule 1   diltiazem (CARDIZEM) 30 MG tablet TAKE 1 TABLET EVERY 4 HOURS AS NEEDED FOR AFIB HEART RATE >100 45 tablet 3   hydrochlorothiazide (HYDRODIURIL) 25 MG tablet TAKE 1 TABLET BY MOUTH EVERY DAY 90 tablet 1   KLOR-CON M10 10 MEQ tablet TAKE 1 TABLET BY MOUTH 3 TIMES A DAY 270 tablet 1   lisinopril (ZESTRIL) 40 MG tablet TAKE 1 TABLET BY MOUTH EVERY DAY 90 tablet 2   pravastatin (PRAVACHOL) 20 MG tablet Take 1 tablet (20 mg total) by mouth daily. 90 tablet 1   traMADol (ULTRAM) 50 MG tablet TAKE 1 TABLET BY MOUTH EVERY 12 HOURS AS NEEDED. 180 tablet 0   XARELTO 20 MG TABS tablet TAKE 1 TABLET BY MOUTH DAILY WITH SUPPER. 90 tablet 1   No current facility-administered medications for this visit.    Allergies:   Nickel   Social History:  The patient  reports that she has quit smoking. She has never used smokeless tobacco. She reports that she does not drink alcohol and does not use drugs.   Family History:  The patient's family history includes Heart failure in her mother; Hypertension in her mother; Leukemia in her father.  ROS:  Please see the history of present illness.    All other systems are reviewed and otherwise negative.   PHYSICAL EXAM:  VS:  There were no vitals taken for this visit. BMI: There is no height or weight on file to calculate BMI. Well nourished, well developed, in no acute distress HEENT: normocephalic, atraumatic Neck: no JVD, carotid bruits or masses Cardiac: irreg-irreg; no significant murmurs, no rubs, or gallops Lungs:    CTA b/l, no wheezing, rhonchi or rales Abd: soft, nontender MS: no deformity or atrophy Ext: no edema Skin: warm and dry, no rash Neuro:  No gross deficits appreciated Psych: euthymic mood, full affect   EKG:  not done today   04/08/2019; Lexiscan stress myoview Nuclear stress EF: 71%. There was no ST segment deviation noted during stress. The study is normal. This is a low risk study. The left  ventricular ejection fraction is hyperdynamic (>65%).    09/10/2014; TTE Study Conclusions  - Left ventricle: The cavity size was normal. There was mild    concentric hypertrophy. Systolic function was normal. The    estimated ejection fraction was in the range of 60% to 65%. Wall    motion was normal; there were no regional wall motion    abnormalities. The study was not technically sufficient to allow    evaluation of LV diastolic dysfunction due to atrial    fibrillation.  - Aortic valve: Trileaflet; normal  thickness leaflets. There was no    regurgitation.  - Aortic root: The aortic root was normal in size.  - Mitral valve: Structurally normal valve.  - Left atrium: The atrium was mildly dilated.  - Right ventricle: The cavity size was normal. Wall thickness was    normal. Systolic function was normal.  - Right atrium: The atrium was normal in size.  - Tricuspid valve: There was mild regurgitation.  - Pulmonic valve: There was trivial regurgitation.  - Pulmonary arteries: Systolic pressure was within the normal    range. PA peak pressure: 32 mm Hg (S).  - Inferior vena cava: The vessel was normal in size.  - Pericardium, extracardiac: There was no pericardial effusion.    Recent Labs: 11/17/2020: ALT 13; BUN 23; Creatinine, Ser 0.84; Hemoglobin 15.2; Platelets 250.0; Potassium 4.2; Sodium 141; TSH 1.71  11/17/2020: Cholesterol 204; HDL 64.70; LDL Cholesterol 109; Total CHOL/HDL Ratio 3; Triglycerides 148.0; VLDL 29.6   CrCl cannot be calculated (Patient's most recent lab result is older than the maximum 21 days allowed.).   Wt Readings from Last 3 Encounters:  11/23/20 155 lb 6.4 oz (70.5 kg)  11/17/20 156 lb 3.2 oz (70.9 kg)  06/03/20 160 lb (72.6 kg)     Other studies reviewed: Additional studies/records reviewed today include: summarized above  ASSESSMENT AND PLAN:  1. Longstanding persistent AFib     CHA2DS2Vasc is 3, on Xarelto, appropriately dosed     Rate  controlled     No changes today, she is happy with her current regime     Labs today  Discussed not ideal to uses NSAIDs chronically though would be OK to use intermittently.   2. HTN     Looks good, no changes today  3. HLD     Discussed briefly today     She follows with her PMD  Disposition: we will continue to see her Q 63mo, sooner if needed    Current medicines are reviewed at length with the patient today.  The patient did not have any concerns regarding medicines.  Venetia Night, PA-C 05/26/2021 7:10 PM     [place] [address] [phone] (office)  [phone] (fax)

## 2021-05-27 ENCOUNTER — Other Ambulatory Visit: Payer: Self-pay

## 2021-05-27 ENCOUNTER — Ambulatory Visit (INDEPENDENT_AMBULATORY_CARE_PROVIDER_SITE_OTHER): Payer: Medicare Other | Admitting: Physician Assistant

## 2021-05-27 ENCOUNTER — Encounter: Payer: Self-pay | Admitting: Physician Assistant

## 2021-05-27 VITALS — BP 122/70 | HR 60 | Ht 64.0 in | Wt 158.0 lb

## 2021-05-27 DIAGNOSIS — I10 Essential (primary) hypertension: Secondary | ICD-10-CM

## 2021-05-27 DIAGNOSIS — I4811 Longstanding persistent atrial fibrillation: Secondary | ICD-10-CM | POA: Diagnosis not present

## 2021-05-27 DIAGNOSIS — Z79899 Other long term (current) drug therapy: Secondary | ICD-10-CM | POA: Diagnosis not present

## 2021-05-27 LAB — CBC
Hematocrit: 41.3 % (ref 34.0–46.6)
Hemoglobin: 14.4 g/dL (ref 11.1–15.9)
MCH: 33.3 pg — ABNORMAL HIGH (ref 26.6–33.0)
MCHC: 34.9 g/dL (ref 31.5–35.7)
MCV: 95 fL (ref 79–97)
Platelets: 250 10*3/uL (ref 150–450)
RBC: 4.33 x10E6/uL (ref 3.77–5.28)
RDW: 12 % (ref 11.7–15.4)
WBC: 6.7 10*3/uL (ref 3.4–10.8)

## 2021-05-27 LAB — BASIC METABOLIC PANEL
BUN/Creatinine Ratio: 26 (ref 12–28)
BUN: 22 mg/dL (ref 8–27)
CO2: 24 mmol/L (ref 20–29)
Calcium: 9.5 mg/dL (ref 8.7–10.3)
Chloride: 102 mmol/L (ref 96–106)
Creatinine, Ser: 0.86 mg/dL (ref 0.57–1.00)
Glucose: 87 mg/dL (ref 70–99)
Potassium: 4.1 mmol/L (ref 3.5–5.2)
Sodium: 141 mmol/L (ref 134–144)
eGFR: 71 mL/min/{1.73_m2} (ref >59)

## 2021-05-27 NOTE — Patient Instructions (Signed)
Medication Instructions:    Your physician recommends that you continue on your current medications as directed. Please refer to the Current Medication list given to you today.  *If you need a refill on your cardiac medications before your next appointment, please call your pharmacy*   Lab Work: BMET AND CBC TODAY    If you have labs (blood work) drawn today and your tests are completely normal, you will receive your results only by: [place] (if you have MyChart) OR A paper copy in the mail If you have any lab test that is abnormal or we need to change your treatment, we will call you to review the results.   Testing/Procedures: NONE ORDERED  TODAY     Follow-Up: At [facility], you and your health needs are our priority.  As part of our continuing mission to provide you with exceptional heart care, we have created designated Provider Care Teams.  These Care Teams include your primary Cardiologist (physician) and Advanced Practice Providers (APPs -  Physician Assistants and Nurse Practitioners) who all work together to provide you with the care you need, when you need it.  We recommend signing up for the patient portal called "MyChart".  Sign up information is provided on this After Visit Summary.  MyChart is used to connect with patients for Virtual Visits (Telemedicine).  Patients are able to view lab/test results, encounter notes, upcoming appointments, etc.  Non-urgent messages can be sent to your provider as well.   To learn more about what you can do with MyChart, go to [email or web address].    Your next appointment:   6 month(s)  The format for your next appointment:   In Person  Provider:   You will see one of the following Advanced Practice Providers on your designated Care Team:   Tommye Standard, [place]    Other Instructions

## 2021-06-07 DIAGNOSIS — Z23 Encounter for immunization: Secondary | ICD-10-CM | POA: Diagnosis not present

## 2021-06-17 DIAGNOSIS — U071 COVID-19: Secondary | ICD-10-CM | POA: Diagnosis not present

## 2021-06-20 DIAGNOSIS — Z124 Encounter for screening for malignant neoplasm of cervix: Secondary | ICD-10-CM | POA: Diagnosis not present

## 2021-06-20 DIAGNOSIS — Z6827 Body mass index (BMI) 27.0-27.9, adult: Secondary | ICD-10-CM | POA: Diagnosis not present

## 2021-06-23 ENCOUNTER — Other Ambulatory Visit: Payer: Self-pay | Admitting: Internal Medicine

## 2021-06-24 ENCOUNTER — Other Ambulatory Visit: Payer: Self-pay | Admitting: Physician Assistant

## 2021-06-24 ENCOUNTER — Other Ambulatory Visit: Payer: Self-pay | Admitting: Internal Medicine

## 2021-06-24 NOTE — Telephone Encounter (Signed)
Xarelto 20 mg refill request received. Pt is 75 years old, weight- 71.7 kg, Crea-0.86 on 05/27/21, last seen by Loyal Jacobson, PA on 05/27/21, Diagnosis-afib, CrCl- 59.06; Dose is appropriate based on dosing criteria. Will send in refill to requested pharmacy.

## 2021-07-11 NOTE — Patient Instructions (Addendum)
  Blood work was ordered.  Have this done in 6-8 weeks at the [facility] lab.    Medications changes include :   increase pravastatin to 40 mg daily      Please followup in 6 months

## 2021-07-11 NOTE — Progress Notes (Signed)
Subjective:    Patient ID: [name], female    DOB: 30-Nov-1945, 75 y.o.   MRN: [id number]  This visit occurred during the SARS-CoV-2 public health emergency.  Safety protocols were in place, including screening questions prior to the visit, additional usage of staff PPE, and extensive cleaning of exam room while observing appropriate contact time as indicated for disinfecting solutions.     HPI The patient is here for follow up of their chronic medical problems, including htn, afib, hld, prediabetes, chronic OA pain ( hips/feet)    She has a lot of joint pain - had a right hip injection and it helped for a while.  Was also on prednisone and that helped everything - she is not sure if the tramadol works or not - she did not take it for a while after the prednisone and steroid injection in the hip.      Medications and allergies reviewed with patient and updated if appropriate.  Patient Active Problem List   Diagnosis Date Noted   Secondary hypercoagulable state ([place]) 11/17/2020   B12 deficiency 10/28/2019   IBS (irritable bowel syndrome) 10/28/2019   Osteoarthritis of hand 06/05/2019   Presbycusis of both ears 05/14/2018   ETD (Eustachian tube dysfunction), bilateral 04/12/2018   Prediabetes 05/15/2017   Pain of right heel 01/09/2017   Primary osteoarthritis of both feet 07/10/2016   Primary osteoarthritis of both hands 07/10/2016   Alopecia totalis 06/05/2016   Dermatochalasis of both upper eyelids 10/12/2015   H/O dysplastic nevus 08/03/2015   Obese 10/14/2014   Degenerative lumbar disc 12/04/2013   Hematuria, undiagnosed cause 09/02/2013   Osteopenia 06/11/2013   Primary generalized (osteo)arthritis 12/18/2011   Mixed hyperlipidemia 03/25/2009   Essential hypertension 03/24/2009   ATRIAL FIBRILLATION 03/24/2009   Greater trochanteric bursitis of left hip 02/12/2007    Current Outpatient Medications on File Prior to Visit  Medication Sig Dispense Refill    acetaminophen (TYLENOL) 650 MG CR tablet Take 650 mg by mouth 2 (two) times daily.      bifidobacterium infantis (ALIGN) capsule Take 1 capsule by mouth daily. 30 capsule 11   cholecalciferol (VITAMIN D) 1000 UNITS tablet Take 2,000 Units by mouth daily.     co-enzyme Q-10 50 MG capsule Take 50 mg by mouth daily.     cyanocobalamin 1000 MCG tablet Take 1,000 mcg by mouth daily.     diltiazem (CARDIZEM CD) 180 MG 24 hr capsule TAKE 1 CAPSULE BY MOUTH TWICE A DAY 180 capsule 1   diltiazem (CARDIZEM) 30 MG tablet TAKE 1 TABLET EVERY 4 HOURS AS NEEDED FOR AFIB HEART RATE >100 45 tablet 3   hydrochlorothiazide (HYDRODIURIL) 25 MG tablet TAKE 1 TABLET BY MOUTH EVERY DAY 30 tablet 0   KLOR-CON M10 10 MEQ tablet TAKE 1 TABLET BY MOUTH THREE TIMES A DAY 270 tablet 1   lisinopril (ZESTRIL) 40 MG tablet TAKE 1 TABLET BY MOUTH EVERY DAY 30 tablet 0   pravastatin (PRAVACHOL) 20 MG tablet Take 1 tablet (20 mg total) by mouth daily. 90 tablet 1   traMADol (ULTRAM) 50 MG tablet TAKE 1 TABLET BY MOUTH EVERY 12 HOURS AS NEEDED 180 tablet 0   XARELTO 20 MG TABS tablet TAKE 1 TABLET BY MOUTH DAILY WITH SUPPER 90 tablet 1   No current facility-administered medications on file prior to visit.    Past Medical History:  Diagnosis Date   ARTHRITIS, HIPS, BILATERAL    Atrial fibrillation ([place])  chronic anticoag - Pradaxa   BURSITIS, SUBTROCHANTERIC    HYPERTENSION    MITRAL VALVE PROLAPSE    Mixed hyperlipidemia     Past Surgical History:  Procedure Laterality Date   DILATION AND CURETTAGE OF UTERUS     TONSILLECTOMY      Social History   Socioeconomic History   Marital status: Married    Spouse name: Not on file   Number of children: 1   Years of education: Not on file   Highest education level: Not on file  Occupational History   Occupation: Retired  Tobacco Use   Smoking status: Former    Years: 1.00    Types: Cigarettes   Smokeless tobacco: Never   Tobacco comments:    social age 40   Vaping Use   Vaping Use: Never used  Substance and Sexual Activity   Alcohol use: No   Drug use: No   Sexual activity: Yes  Other Topics Concern   Not on file  Social History Narrative   Not on file   Social Determinants of Health   Financial Resource Strain: Not on file  Food Insecurity: Not on file  Transportation Needs: Not on file  Physical Activity: Not on file  Stress: Not on file  Social Connections: Not on file    Family History  Problem Relation Age of Onset   Hypertension Mother    Heart failure Mother    Leukemia Father     Review of Systems  Constitutional:  Negative for chills and fever.  Respiratory:  Negative for cough, shortness of breath and wheezing.   Cardiovascular:  Negative for chest pain, palpitations and leg swelling.  Musculoskeletal:  Positive for arthralgias.  Neurological:  Negative for light-headedness and headaches.      Objective:   Vitals:   07/12/21 1355  BP: 120/80  Pulse: 87  Temp: 97.9 F (36.6 C)  SpO2: 97%   BP Readings from Last 3 Encounters:  07/12/21 120/80  05/27/21 122/70  11/23/20 120/80   Wt Readings from Last 3 Encounters:  07/12/21 158 lb (71.7 kg)  05/27/21 158 lb (71.7 kg)  11/23/20 155 lb 6.4 oz (70.5 kg)   Body mass index is 27.12 kg/m.   Physical Exam    Constitutional: Appears well-developed and well-nourished. No distress.  HENT:  Head: Normocephalic and atraumatic.  Neck: Neck supple. No tracheal deviation present. No thyromegaly present.  No cervical lymphadenopathy Cardiovascular: Normal rate, regular rhythm and normal heart sounds.   No murmur heard. No carotid bruit .  No edema Pulmonary/Chest: Effort normal and breath sounds normal. No respiratory distress. No has no wheezes. No rales.  Skin: Skin is warm and dry. Not diaphoretic.  Psychiatric: Normal mood and affect. Behavior is normal.      Assessment & Plan:    See Problem List for Assessment and Plan of chronic medical  problems.

## 2021-07-12 ENCOUNTER — Other Ambulatory Visit: Payer: Self-pay

## 2021-07-12 ENCOUNTER — Encounter: Payer: Self-pay | Admitting: Internal Medicine

## 2021-07-12 ENCOUNTER — Ambulatory Visit (INDEPENDENT_AMBULATORY_CARE_PROVIDER_SITE_OTHER): Payer: Medicare Other | Admitting: Internal Medicine

## 2021-07-12 VITALS — BP 120/80 | HR 87 | Temp 97.9°F | Ht 64.0 in | Wt 158.0 lb

## 2021-07-12 DIAGNOSIS — I4821 Permanent atrial fibrillation: Secondary | ICD-10-CM

## 2021-07-12 DIAGNOSIS — E782 Mixed hyperlipidemia: Secondary | ICD-10-CM | POA: Diagnosis not present

## 2021-07-12 DIAGNOSIS — I10 Essential (primary) hypertension: Secondary | ICD-10-CM | POA: Diagnosis not present

## 2021-07-12 DIAGNOSIS — E538 Deficiency of other specified B group vitamins: Secondary | ICD-10-CM

## 2021-07-12 DIAGNOSIS — M150 Primary generalized (osteo)arthritis: Secondary | ICD-10-CM | POA: Diagnosis not present

## 2021-07-12 DIAGNOSIS — R7303 Prediabetes: Secondary | ICD-10-CM | POA: Diagnosis not present

## 2021-07-12 MED ORDER — PRAVASTATIN SODIUM 40 MG PO TABS: 40.0000 mg | ORAL_TABLET | Freq: Every day | ORAL | 3 refills | Status: DC

## 2021-07-12 NOTE — Assessment & Plan Note (Addendum)
Chronic Arthritis in multiple areas Daily pain - seeing ortho at [facility] Has had some injections and prednisone courses which has helped ? How much tramadol helps Continue tramadol 50 mg every 12 hours as needed-take Tylenol with tramadol

## 2021-07-12 NOTE — Assessment & Plan Note (Signed)
Chronic Following with cardiology Asymptomatic On Xarelto 20 mg daily, diltiazem 180 mg twice daily Has diltiazem to use as needed CBC, CMP

## 2021-07-12 NOTE — Assessment & Plan Note (Addendum)
Chronic LDL slightly higher than ideal Check lipid panel in 6-8 weeks Increase pravastatin to 40 mg daily

## 2021-07-12 NOTE — Assessment & Plan Note (Signed)
Chronic Sugars have been in the normal range Check a1c Low sugar / carb diet Stressed regular exercise  Lab Results  Component Value Date   HGBA1C 5.2 11/17/2020

## 2021-07-12 NOTE — Assessment & Plan Note (Signed)
Chronic Blood pressure well controlled CMP Continue diltiazem 180 mg twice daily, hydrochlorothiazide 25 mg daily, lisinopril 40 mg daily

## 2021-07-12 NOTE — Assessment & Plan Note (Signed)
Chronic Taking B12 Check B12 level 

## 2021-07-18 ENCOUNTER — Encounter: Payer: Self-pay | Admitting: Internal Medicine

## 2021-07-18 DIAGNOSIS — M25512 Pain in left shoulder: Secondary | ICD-10-CM | POA: Diagnosis not present

## 2021-07-18 DIAGNOSIS — M542 Cervicalgia: Secondary | ICD-10-CM | POA: Diagnosis not present

## 2021-07-18 DIAGNOSIS — M9908 Segmental and somatic dysfunction of rib cage: Secondary | ICD-10-CM | POA: Diagnosis not present

## 2021-07-18 DIAGNOSIS — M9902 Segmental and somatic dysfunction of thoracic region: Secondary | ICD-10-CM | POA: Diagnosis not present

## 2021-07-18 DIAGNOSIS — M9907 Segmental and somatic dysfunction of upper extremity: Secondary | ICD-10-CM | POA: Diagnosis not present

## 2021-07-18 DIAGNOSIS — M25551 Pain in right hip: Secondary | ICD-10-CM | POA: Diagnosis not present

## 2021-07-18 DIAGNOSIS — R531 Weakness: Secondary | ICD-10-CM | POA: Diagnosis not present

## 2021-07-18 DIAGNOSIS — M9901 Segmental and somatic dysfunction of cervical region: Secondary | ICD-10-CM | POA: Diagnosis not present

## 2021-07-18 MED ORDER — PRAVASTATIN SODIUM 40 MG PO TABS: 40.0000 mg | ORAL_TABLET | Freq: Every day | ORAL | 3 refills | Status: DC

## 2021-07-20 DIAGNOSIS — Z1211 Encounter for screening for malignant neoplasm of colon: Secondary | ICD-10-CM | POA: Diagnosis not present

## 2021-07-25 ENCOUNTER — Other Ambulatory Visit: Payer: Self-pay | Admitting: Internal Medicine

## 2021-07-28 LAB — COLOGUARD: COLOGUARD: POSITIVE — AB

## 2021-07-29 ENCOUNTER — Other Ambulatory Visit: Payer: Self-pay | Admitting: Physician Assistant

## 2021-07-29 ENCOUNTER — Other Ambulatory Visit: Payer: Self-pay | Admitting: Internal Medicine

## 2021-07-29 NOTE — Telephone Encounter (Signed)
Prescription refill request for Xarelto received.  Indication:Afib  Last office visit:05/27/21 Charlcie Cradle)  Weight:71.7kg Age: 75 Scr: 0.86(05/27/21) CrCl: 64.35ml/min  Appropriate dose and refill sent to requested pharmacy.

## 2021-08-03 DIAGNOSIS — M25512 Pain in left shoulder: Secondary | ICD-10-CM | POA: Diagnosis not present

## 2021-08-03 DIAGNOSIS — M9907 Segmental and somatic dysfunction of upper extremity: Secondary | ICD-10-CM | POA: Diagnosis not present

## 2021-08-10 DIAGNOSIS — D1730 Benign lipomatous neoplasm of skin and subcutaneous tissue of unspecified sites: Secondary | ICD-10-CM | POA: Diagnosis not present

## 2021-08-10 DIAGNOSIS — D2230 Melanocytic nevi of unspecified part of face: Secondary | ICD-10-CM | POA: Diagnosis not present

## 2021-08-10 DIAGNOSIS — D224 Melanocytic nevi of scalp and neck: Secondary | ICD-10-CM | POA: Diagnosis not present

## 2021-08-10 DIAGNOSIS — L578 Other skin changes due to chronic exposure to nonionizing radiation: Secondary | ICD-10-CM | POA: Diagnosis not present

## 2021-08-10 DIAGNOSIS — L821 Other seborrheic keratosis: Secondary | ICD-10-CM | POA: Diagnosis not present

## 2021-08-10 DIAGNOSIS — D485 Neoplasm of uncertain behavior of skin: Secondary | ICD-10-CM | POA: Diagnosis not present

## 2021-08-10 DIAGNOSIS — Z86018 Personal history of other benign neoplasm: Secondary | ICD-10-CM | POA: Diagnosis not present

## 2021-08-10 DIAGNOSIS — L503 Dermatographic urticaria: Secondary | ICD-10-CM | POA: Diagnosis not present

## 2021-08-10 DIAGNOSIS — D2239 Melanocytic nevi of other parts of face: Secondary | ICD-10-CM | POA: Diagnosis not present

## 2021-08-10 DIAGNOSIS — L639 Alopecia areata, unspecified: Secondary | ICD-10-CM | POA: Diagnosis not present

## 2021-08-10 DIAGNOSIS — D225 Melanocytic nevi of trunk: Secondary | ICD-10-CM | POA: Diagnosis not present

## 2021-08-11 ENCOUNTER — Telehealth: Payer: Self-pay | Admitting: *Deleted

## 2021-08-11 NOTE — Telephone Encounter (Signed)
° °  Pre-operative Risk Assessment    Patient Name: [name]  DOB: 22-Dec-1945 MRN: [id number]      Request for Surgical Clearance    Procedure:   COLONOSCOPY  Date of Surgery:  Clearance 12/06/21                                 Surgeon:  DR. Dellis Filbert MEDOFF Surgeon's Group or Practice Name:  [facility] Phone number:  [phone] Fax number:  [phone]   Type of Clearance Requested:   - Medical  - Pharmacy:  Hold Rivaroxaban (Xarelto)     Type of Anesthesia:  Not Indicated (PROPOFOL?)   Additional requests/questions:    Jiles Prows   08/11/2021, 9:56 AM

## 2021-08-11 NOTE — Telephone Encounter (Signed)
Will send message to EP scheduler [place] to reach to the pt with an appt for pre op clearance.

## 2021-08-11 NOTE — Telephone Encounter (Signed)
° °  Name: [name]  DOB: 10-19-1945  MRN: [id number]  Primary Cardiologist: Dr. Rayann Heman  Chart reviewed as part of pre-operative protocol coverage. Because of [name]'s past medical history and time since last visit, she will require a follow-up visit in order to better assess preoperative cardiovascular risk.  At last OV 05/2021 it was recommended she f/u in 6 months (11/2021).  This will give the provider adequate time to review and advise on colonoscopy since it is so far away. Recommend provider review anticoagulation recommendation with pharm team at time of appt since it is too far out (4 months away) to make an adequate recommendation at this time.  Primarily follows with EP team so appt should be with EP.  Pre-op covering staff: - Please schedule appointment and call patient to inform them. If patient already had an upcoming appointment within acceptable timeframe, please add "pre-op clearance" to the appointment notes so provider is aware. - Please contact requesting surgeon's office via preferred method (i.e, phone, fax) to inform them of need for appointment prior to surgery.   Charlie Pitter, PA-C  08/11/2021, 10:48 AM

## 2021-08-15 DIAGNOSIS — R531 Weakness: Secondary | ICD-10-CM | POA: Diagnosis not present

## 2021-08-15 DIAGNOSIS — M25512 Pain in left shoulder: Secondary | ICD-10-CM | POA: Diagnosis not present

## 2021-08-15 DIAGNOSIS — M75102 Unspecified rotator cuff tear or rupture of left shoulder, not specified as traumatic: Secondary | ICD-10-CM | POA: Diagnosis not present

## 2021-08-15 NOTE — Telephone Encounter (Signed)
Pt has appt 11/23/21 with Tommye Standard, PAC. I will send FYI to requesting office pt has appt 11/23/21.

## 2021-08-29 ENCOUNTER — Ambulatory Visit (INDEPENDENT_AMBULATORY_CARE_PROVIDER_SITE_OTHER): Payer: Medicare Other | Admitting: Plastic Surgery

## 2021-08-29 ENCOUNTER — Other Ambulatory Visit: Payer: Self-pay

## 2021-08-29 VITALS — BP 143/83 | HR 76 | Ht 64.0 in | Wt 158.8 lb

## 2021-08-29 DIAGNOSIS — D489 Neoplasm of uncertain behavior, unspecified: Secondary | ICD-10-CM

## 2021-08-31 NOTE — Progress Notes (Signed)
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   [name] is an 76 y.o. female.  HPI: 76 year old status post biopsy of right forehead by dermatology.  The biopsy revealed moderate to severe atypia.  The patient was referred for excision.  Allergies  Allergen Reactions   Nickel Rash    Outpatient Encounter Medications as of 08/29/2021  Medication Sig   acetaminophen (TYLENOL) 650 MG CR tablet Take 650 mg by mouth 2 (two) times daily.    bifidobacterium infantis (ALIGN) capsule Take 1 capsule by mouth daily.   cholecalciferol (VITAMIN D) 1000 UNITS tablet Take 2,000 Units by mouth daily.   co-enzyme Q-10 50 MG capsule Take 50 mg by mouth daily.   cyanocobalamin 1000 MCG tablet Take 1,000 mcg by mouth daily.   diltiazem (CARDIZEM CD) 180 MG 24 hr capsule TAKE 1 CAPSULE BY MOUTH TWICE A DAY   diltiazem (CARDIZEM) 30 MG tablet TAKE 1 TABLET EVERY 4 HOURS AS NEEDED FOR AFIB HEART RATE >100   hydrochlorothiazide (HYDRODIURIL) 25 MG tablet TAKE 1 TABLET BY MOUTH EVERY DAY   KLOR-CON M10 10 MEQ tablet TAKE 1 TABLET BY MOUTH THREE TIMES A DAY   lisinopril (ZESTRIL) 40 MG tablet TAKE 1 TABLET BY MOUTH EVERY DAY   pravastatin (PRAVACHOL) 40 MG tablet Take 1 tablet (40 mg total) by mouth daily.   traMADol (ULTRAM) 50 MG tablet TAKE 1 TABLET BY MOUTH EVERY 12 HOURS AS NEEDED   XARELTO 20 MG TABS tablet TAKE 1 TABLET BY MOUTH DAILY WITH SUPPER   No facility-administered encounter medications on file as of 08/29/2021.     Past Medical History:  Diagnosis Date   ARTHRITIS, HIPS, BILATERAL    Atrial fibrillation ([facility])    chronic anticoag - Pradaxa   BURSITIS, SUBTROCHANTERIC    HYPERTENSION    MITRAL VALVE PROLAPSE    Mixed hyperlipidemia     Past Surgical History:  Procedure Laterality Date   DILATION AND CURETTAGE OF UTERUS     TONSILLECTOMY      Family History  Problem  Relation Age of Onset   Hypertension Mother    Heart failure Mother    Leukemia Father     Social History   Social History Narrative   Not on file     Review of Systems General: Denies fevers, chills, weight loss CV: Denies chest pain, shortness of breath, palpitations   Physical Exam Vitals with BMI 08/29/2021 07/12/2021 05/27/2021  Height 5\' 4"  5\' 4"  5\' 4"   Weight 158 lbs 13 oz 158 lbs 158 lbs  BMI 27.24 72.53 66.44  Systolic 034 742 595  Diastolic 83 80 70  Pulse 76 87 60    General:  No acute distress,  Alert and oriented, Non-Toxic, Normal speech and affect Heent:  Well healed biopsy scar on the right forehead.     Assessment/Plan Excision with margin indicated.  The patient will schedule under local.  Lennice Sites 08/31/2021, 9:17 AM

## 2021-09-02 ENCOUNTER — Other Ambulatory Visit: Payer: Self-pay | Admitting: Internal Medicine

## 2021-09-09 ENCOUNTER — Ambulatory Visit (INDEPENDENT_AMBULATORY_CARE_PROVIDER_SITE_OTHER): Payer: Medicare Other | Admitting: Plastic Surgery

## 2021-09-09 ENCOUNTER — Other Ambulatory Visit: Payer: Self-pay | Admitting: Internal Medicine

## 2021-09-09 ENCOUNTER — Other Ambulatory Visit (HOSPITAL_COMMUNITY)
Admission: RE | Admit: 2021-09-09 | Discharge: 2021-09-09 | Disposition: A | Discharge status: Home / Self Care | Payer: Medicare Other | Source: Ambulatory Visit | Attending: Plastic Surgery | Admitting: Plastic Surgery

## 2021-09-09 ENCOUNTER — Other Ambulatory Visit: Payer: Self-pay

## 2021-09-09 ENCOUNTER — Encounter: Payer: Self-pay | Admitting: Plastic Surgery

## 2021-09-09 VITALS — BP 127/57 | HR 89

## 2021-09-09 DIAGNOSIS — D489 Neoplasm of uncertain behavior, unspecified: Secondary | ICD-10-CM | POA: Diagnosis not present

## 2021-09-09 DIAGNOSIS — D2330 Other benign neoplasm of skin of unspecified part of face: Secondary | ICD-10-CM | POA: Diagnosis not present

## 2021-09-09 DIAGNOSIS — L089 Local infection of the skin and subcutaneous tissue, unspecified: Secondary | ICD-10-CM | POA: Diagnosis not present

## 2021-09-09 NOTE — Progress Notes (Signed)
Operative Note   DATE OF OPERATION: 09/09/2021  LOCATION:    SURGICAL DEPARTMENT: Plastic Surgery  PREOPERATIVE DIAGNOSES: Right forehead  POSTOPERATIVE DIAGNOSES:  same  PROCEDURE:  1.2 cm excision right forehead 1.2 cm complex closure right forehead  SURGEON: Shaden Higley P. Chera Slivka, MD  ANESTHESIA:  Local  COMPLICATIONS: None.   INDICATIONS FOR PROCEDURE:  The patient, [name] is a 76 y.o. female born on [birth year], is here for treatment of dysplastic nevus with moderate to severe atypia right forehead. MRN: [id number]  CONSENT:  Informed consent was obtained directly from the patient. Risks, benefits and alternatives were fully discussed. Specific risks including but not limited to bleeding, infection, hematoma, seroma, scarring, pain, infection, wound healing problems, and need for further surgery were all discussed. The patient did have an ample opportunity to have questions answered to satisfaction.   DESCRIPTION OF PROCEDURE:  Local anesthesia was administered. The patient's operative site was prepped and draped in a sterile fashion. A time out was performed and all information was confirmed to be correct.  The lesion was excised with a 15 blade.  Hemostasis was obtained.  Wide circumferential undermining was performed and the skin was advanced and closed in layers with a running horizontal mattress with Prolene for the skin.  The lesion excised measured 1.2 cm, and the total length of closure measured 1.2 cm.    The patient tolerated the procedure well.  There were no complications.

## 2021-09-09 NOTE — Addendum Note (Signed)
Addended by: Lennice Sites on: 09/09/2021 03:22 PM   Modules accepted: Orders

## 2021-09-15 LAB — SURGICAL PATHOLOGY

## 2021-09-19 ENCOUNTER — Ambulatory Visit: Payer: Medicare Other | Admitting: Plastic Surgery

## 2021-09-23 ENCOUNTER — Ambulatory Visit (INDEPENDENT_AMBULATORY_CARE_PROVIDER_SITE_OTHER): Payer: Medicare Other | Admitting: Plastic Surgery

## 2021-09-23 ENCOUNTER — Encounter: Payer: Self-pay | Admitting: Plastic Surgery

## 2021-09-23 ENCOUNTER — Other Ambulatory Visit: Payer: Self-pay

## 2021-09-23 DIAGNOSIS — D489 Neoplasm of uncertain behavior, unspecified: Secondary | ICD-10-CM

## 2021-09-23 NOTE — Progress Notes (Signed)
Status post excision of forehead neoplasm, right forehead.  No complaints  Physical exam Incision clean dry and intact  Pathology: Scar.  No residual neoplasm  Assessment and plan Doing well after excision of dysplastic nevus.  No residual lesion.  The patient may follow-up as needed.

## 2021-09-29 DIAGNOSIS — M9907 Segmental and somatic dysfunction of upper extremity: Secondary | ICD-10-CM | POA: Diagnosis not present

## 2021-09-29 DIAGNOSIS — M25512 Pain in left shoulder: Secondary | ICD-10-CM | POA: Diagnosis not present

## 2021-10-04 ENCOUNTER — Other Ambulatory Visit: Payer: Self-pay | Admitting: Internal Medicine

## 2021-10-11 DIAGNOSIS — U071 COVID-19: Secondary | ICD-10-CM | POA: Diagnosis not present

## 2021-11-20 NOTE — Progress Notes (Signed)
? ?Cardiology Office Note ?Date:  11/20/2021  ?Patient ID:  [name], Kristin [birth year], MRN [id number] ?PCP:  Binnie Rail, MD  ?Cardiologist/Electrophysiologist: Dr. Rayann Heman ? ?   ?Chief Complaint: 6 mo,  pre-op ? ?History of Present Illness: ?[name] is a 76 y.o. female with history of AFib, HTN. ? ?She comes in today to be seen for Dr. Rayann Heman.  Last seen by him via a tele health visit Aug 2020.  Doing well with rate controlled AFib for years.  Mentioned some CP that she suspected was musculoskeletal s/s workout at the gym, but worried her, planned for stress testing. ?She had f/o with Genene Churn, NP in Sept.  She was very active without CP, her myoview low risk.  CP felt to be very atypical and pt suspected stress/anxiety related. ? ?I saw her 05/27/2020 ?She is doing great.  Her and her husband have been traveling, [place], [place] did [place].  [facility] will get her a little winded (for years), but reports good exertional caacity, no CP. ?She infrequently will feel palpitations fast rates and use her PRN dilt dose. ?No fainting ?She feels like on Eliquis she tends to bleed a little longer then she did on xarelto.  Stopped easily nough with placing a band aide, no excessive bleeding or unprovoked bleeding.  And as it tuned out not any financial benefit in the switch to Eliquis, (the reason she made the change). ? ?She subsequently had an insurance change is back on Xarelto ? ?I aw her 11/23/20 ?She has noticed a couple times in the last couple months feeling a bit jittery/tremulous and reminds her of how she felt when her Afib first started ?Last week at her PMD her HR was 100 and this very unusual for her typically always in the 70's though felt fine, and recalls when feeling this symptom her HR was in the 90's ?She has taken a PRN dilt and her HR did settle and seemed to feel better as well. ?No CP, no SOB or DOE ?No dizziness, near syncope or  syncope ?She on xarelto, no bleeding or sigsn of bleeding ?Not clear that her symptoms were 2/2 AFib, she was asked to keep a log, stay well hydrated, discussed use of prn dilt and review her symptoms with her PMD as well. ? ?I saw her oct 2022 ?She is doing well. ?She and her husband have been traveling in thier RV ?She stays active has a bad hip and this can get limiting though prior to their last trip she was pulsed with steroid and had an injection and was relatively pain free and able to walk around/explore their destinations ?Has good exertional capacity. ?No CP, no SOB ?She has not been bothered or aware of her AFib, thinks it has been months since she has used a PRN dilt. ?She says she will feel a little nauseous/anxious feeling in her stomach on occasion, this she attributes to her AFib ?No palpitations ?No dizziness, near syncope or syncope. ?No bleeding or signs of bleeding, bruises easily with minimal trauma arms. ?She has a bad hip and asks about using Celebrex ?Cautioned on regular use of NSAIDs, no changes were made, planned for 6 mo visit ? ?Pending colonoscopy, needs pre-procedure cardiac eval ?Needs to hold xarelto  ? ?RCRI score is zero, 0.4% risk ? ?TODAY ?Again doing well ?Infrequently with take a PRN dilt for sense of increased HRs ?She continues  to be active, planning a 30 day stay in the [place] coming up and then [facility], also planning a trip to [place] as well. ?No CP, SOD, OE ?No near syncope or syncope. ?No bleeding or signs of bleeding ? ?Colonoscopy is routine/screening, though she does have chronic bowel issues ? ? ? ? ?Past Medical History:  ?Diagnosis Date  ? ARTHRITIS, HIPS, BILATERAL   ? Atrial fibrillation ([place])   ? chronic anticoag - Pradaxa  ? BURSITIS, SUBTROCHANTERIC   ? HYPERTENSION   ? MITRAL VALVE PROLAPSE   ? Mixed hyperlipidemia   ? ? ?Past Surgical History:  ?Procedure Laterality Date  ? DILATION AND CURETTAGE OF UTERUS    ? TONSILLECTOMY    ? ? ?Current  Outpatient Medications  ?Medication Sig Dispense Refill  ? acetaminophen (TYLENOL) 650 MG CR tablet Take 650 mg by mouth 2 (two) times daily.     ? bifidobacterium infantis (ALIGN) capsule Take 1 capsule by mouth daily. 30 capsule 11  ? cholecalciferol (VITAMIN D) 1000 UNITS tablet Take 2,000 Units by mouth daily.    ? co-enzyme Q-10 50 MG capsule Take 50 mg by mouth daily.    ? cyanocobalamin 1000 MCG tablet Take 1,000 mcg by mouth daily.    ? diltiazem (CARDIZEM CD) 180 MG 24 hr capsule TAKE 1 CAPSULE BY MOUTH TWICE A DAY 180 capsule 1  ? diltiazem (CARDIZEM) 30 MG tablet TAKE 1 TABLET EVERY 4 HOURS AS NEEDED FOR AFIB HEART RATE >100 45 tablet 3  ? hydrochlorothiazide (HYDRODIURIL) 25 MG tablet TAKE 1 TABLET BY MOUTH EVERY DAY 90 tablet 1  ? KLOR-CON M10 10 MEQ tablet TAKE 1 TABLET BY MOUTH THREE TIMES A DAY 270 tablet 1  ? lisinopril (ZESTRIL) 40 MG tablet TAKE 1 TABLET BY MOUTH EVERY DAY 90 tablet 0  ? pravastatin (PRAVACHOL) 40 MG tablet Take 1 tablet (40 mg total) by mouth daily. 90 tablet 3  ? traMADol (ULTRAM) 50 MG tablet TAKE 1 TABLET BY MOUTH EVERY 12 HOURS AS NEEDED 180 tablet 0  ? XARELTO 20 MG TABS tablet TAKE 1 TABLET BY MOUTH DAILY WITH SUPPER 90 tablet 1  ? ?No current facility-administered medications for this visit.  ? ? ?Allergies:   Nickel  ? ?Social History:  The patient  reports that she has quit smoking. Her smoking use included cigarettes. She has never used smokeless tobacco. She reports that she does not drink alcohol and does not use drugs.  ? ?Family History:  The patient's family history includes Heart failure in her mother; Hypertension in her mother; Leukemia in her father. ? ?ROS:  Please see the history of present illness.    ?All other systems are reviewed and otherwise negative.  ? ?PHYSICAL EXAM:  ?VS:  There were no vitals taken for this visit. BMI: There is no height or weight on file to calculate BMI. ?Well nourished, well developed, in no acute distress ?HEENT: normocephalic,  atraumatic ?Neck: no JVD, carotid bruits or masses ?Cardiac: irreg-irreg; no significant murmurs, no rubs, or gallops ?Lungs:    CTA b/l, no wheezing, rhonchi or rales ?Abd: soft, nontender ?MS: no deformity or atrophy ?Ext: no edema ?Skin: warm and dry, no rash ?Neuro:  No gross deficits appreciated ?Psych: euthymic mood, full affect ? ? ?EKG:  done today and reviewed by myself: ?Afib 81bpm, no changes ? ? ?04/08/2019; Lexiscan stress myoview ?Nuclear stress EF: 71%. ?There was no ST segment deviation noted during stress. ?The study is normal. ?This  is a low risk study. ?The left ventricular ejection fraction is hyperdynamic (>65%). ? ? ? ?09/10/2014; TTE ?Study Conclusions  ?- Left ventricle: The cavity size was normal. There was mild  ?  concentric hypertrophy. Systolic function was normal. The  ?  estimated ejection fraction was in the range of 60% to 65%. Wall  ?  motion was normal; there were no regional wall motion  ?  abnormalities. The study was not technically sufficient to allow  ?  evaluation of LV diastolic dysfunction due to atrial  ?  fibrillation.  ?- Aortic valve: Trileaflet; normal thickness leaflets. There was no  ?  regurgitation.  ?- Aortic root: The aortic root was normal in size.  ?- Mitral valve: Structurally normal valve.  ?- Left atrium: The atrium was mildly dilated.  ?- Right ventricle: The cavity size was normal. Wall thickness was  ?  normal. Systolic function was normal.  ?- Right atrium: The atrium was normal in size.  ?- Tricuspid valve: There was mild regurgitation.  ?- Pulmonic valve: There was trivial regurgitation.  ?- Pulmonary arteries: Systolic pressure was within the normal  ?  range. PA peak pressure: 32 mm Hg (S).  ?- Inferior vena cava: The vessel was normal in size.  ?- Pericardium, extracardiac: There was no pericardial effusion.  ? ? ?Recent Labs: ?05/27/2021: BUN 22; Creatinine, Ser 0.86; Hemoglobin 14.4; Platelets 250; Potassium 4.1; Sodium 141  ?No results found for  requested labs within last 8760 hours.  ? ?CrCl cannot be calculated (Patient's most recent lab result is older than the maximum 21 days allowed.).  ? ?Wt Readings from Last 3 Encounters:  ?08/29/21 158 lb 12.8 oz (72

## 2021-11-23 ENCOUNTER — Ambulatory Visit (INDEPENDENT_AMBULATORY_CARE_PROVIDER_SITE_OTHER): Payer: Medicare Other | Admitting: Physician Assistant

## 2021-11-23 ENCOUNTER — Encounter: Payer: Self-pay | Admitting: Physician Assistant

## 2021-11-23 VITALS — BP 142/77 | HR 81 | Ht 64.0 in | Wt 160.0 lb

## 2021-11-23 DIAGNOSIS — Z01818 Encounter for other preprocedural examination: Secondary | ICD-10-CM | POA: Diagnosis not present

## 2021-11-23 DIAGNOSIS — I4811 Longstanding persistent atrial fibrillation: Secondary | ICD-10-CM

## 2021-11-23 DIAGNOSIS — M542 Cervicalgia: Secondary | ICD-10-CM | POA: Diagnosis not present

## 2021-11-23 DIAGNOSIS — I10 Essential (primary) hypertension: Secondary | ICD-10-CM

## 2021-11-23 NOTE — Patient Instructions (Signed)
Medication Instructions:   Your physician recommends that you continue on your current medications as directed. Please refer to the Current Medication list given to you today.   *If you need a refill on your cardiac medications before your next appointment, please call your pharmacy*   Lab Work: NONE ORDERED  TODAY     If you have labs (blood work) drawn today and your tests are completely normal, you will receive your results only by: MyChart Message (if you have MyChart) OR A paper copy in the mail If you have any lab test that is abnormal or we need to change your treatment, we will call you to review the results.   Testing/Procedures: NONE ORDERED  TODAY    Follow-Up: At [facility], you and your health needs are our priority.  As part of our continuing mission to provide you with exceptional heart care, we have created designated Provider Care Teams.  These Care Teams include your primary Cardiologist (physician) and Advanced Practice Providers (APPs -  Physician Assistants and Nurse Practitioners) who all work together to provide you with the care you need, when you need it.  We recommend signing up for the patient portal called "MyChart".  Sign up information is provided on this After Visit Summary.  MyChart is used to connect with patients for Virtual Visits (Telemedicine).  Patients are able to view lab/test results, encounter notes, upcoming appointments, etc.  Non-urgent messages can be sent to your provider as well.   To learn more about what you can do with MyChart, go to [email or web address].    Your next appointment:   6 month(s)  The format for your next appointment:   In Person  Provider:   Cameron Lambert, MD    Other Instructions  Important Information About Sugar       

## 2021-12-06 DIAGNOSIS — D123 Benign neoplasm of transverse colon: Secondary | ICD-10-CM | POA: Diagnosis not present

## 2021-12-06 DIAGNOSIS — Z1211 Encounter for screening for malignant neoplasm of colon: Secondary | ICD-10-CM | POA: Diagnosis not present

## 2021-12-06 DIAGNOSIS — K635 Polyp of colon: Secondary | ICD-10-CM | POA: Diagnosis not present

## 2021-12-06 DIAGNOSIS — Z86010 Personal history of colonic polyps: Secondary | ICD-10-CM | POA: Diagnosis not present

## 2021-12-06 DIAGNOSIS — D120 Benign neoplasm of cecum: Secondary | ICD-10-CM | POA: Diagnosis not present

## 2021-12-06 DIAGNOSIS — R195 Other fecal abnormalities: Secondary | ICD-10-CM | POA: Diagnosis not present

## 2021-12-06 LAB — HM COLONOSCOPY

## 2021-12-08 ENCOUNTER — Encounter: Payer: Self-pay | Admitting: Internal Medicine

## 2021-12-08 NOTE — Progress Notes (Signed)
Outside notes received. Information abstracted. Notes sent to scan.  

## 2021-12-16 DIAGNOSIS — M25512 Pain in left shoulder: Secondary | ICD-10-CM | POA: Diagnosis not present

## 2021-12-16 DIAGNOSIS — M9902 Segmental and somatic dysfunction of thoracic region: Secondary | ICD-10-CM | POA: Diagnosis not present

## 2021-12-16 DIAGNOSIS — M9907 Segmental and somatic dysfunction of upper extremity: Secondary | ICD-10-CM | POA: Diagnosis not present

## 2021-12-16 DIAGNOSIS — M9908 Segmental and somatic dysfunction of rib cage: Secondary | ICD-10-CM | POA: Diagnosis not present

## 2022-01-07 DIAGNOSIS — Z9189 Other specified personal risk factors, not elsewhere classified: Secondary | ICD-10-CM | POA: Diagnosis not present

## 2022-01-07 DIAGNOSIS — L03115 Cellulitis of right lower limb: Secondary | ICD-10-CM | POA: Diagnosis not present

## 2022-01-07 DIAGNOSIS — L0390 Cellulitis, unspecified: Secondary | ICD-10-CM | POA: Diagnosis not present

## 2022-01-07 DIAGNOSIS — L282 Other prurigo: Secondary | ICD-10-CM | POA: Diagnosis not present

## 2022-01-07 DIAGNOSIS — Z7901 Long term (current) use of anticoagulants: Secondary | ICD-10-CM | POA: Diagnosis not present

## 2022-01-07 DIAGNOSIS — I482 Chronic atrial fibrillation, unspecified: Secondary | ICD-10-CM | POA: Diagnosis not present

## 2022-01-10 ENCOUNTER — Other Ambulatory Visit: Payer: Self-pay | Admitting: Internal Medicine

## 2022-01-10 MED ORDER — TRAMADOL HCL 50 MG PO TABS: 50.0000 mg | ORAL_TABLET | Freq: Two times a day (BID) | ORAL | 0 refills | Status: DC | PRN

## 2022-01-10 NOTE — Telephone Encounter (Signed)
Check [place] registry last filled 10/04/2021.Marland KitchenChryl Heck

## 2022-01-12 ENCOUNTER — Telehealth: Payer: Self-pay | Admitting: Internal Medicine

## 2022-01-12 NOTE — Telephone Encounter (Signed)
Left message for patient to call back to schedule Medicare Annual Wellness Visit   Last AWV  06/20/19  Please schedule at anytime with [place] if patient calls the office back.     Any questions, please call me at [phone]

## 2022-01-17 DIAGNOSIS — M25512 Pain in left shoulder: Secondary | ICD-10-CM | POA: Diagnosis not present

## 2022-01-17 DIAGNOSIS — M75102 Unspecified rotator cuff tear or rupture of left shoulder, not specified as traumatic: Secondary | ICD-10-CM | POA: Diagnosis not present

## 2022-01-18 ENCOUNTER — Ambulatory Visit (INDEPENDENT_AMBULATORY_CARE_PROVIDER_SITE_OTHER): Payer: Medicare Other

## 2022-01-18 DIAGNOSIS — Z0000 Encounter for general adult medical examination without abnormal findings: Secondary | ICD-10-CM

## 2022-01-18 NOTE — Progress Notes (Signed)
Subjective:   [name] is a 76 y.o. female who presents for Medicare Annual (Subsequent) preventive examination.  I connected with Daren today by phone and verified that I am speaking with the correct person using two identifiers. Location patient: home Location provider: work  Review of Systems    No ROS. Medicare Wellness Telephone Visit. Additional risk factors are reflected in social history.       Objective:    Today's Vitals   01/18/22 1640  PainSc: 3    There is no height or weight on file to calculate BMI.     06/20/2019   10:28 AM 04/18/2018    8:26 AM 11/15/2016    3:31 PM 10/11/2016    9:57 AM 03/28/2016    9:36 AM 12/08/2015   11:28 AM 10/17/2015    4:17 PM  Advanced Directives  Does Patient Have a Medical Advance Directive? Yes Yes Yes Yes Yes Yes No  Type of Paramedic of [place];Living will [place];Living will [place];Living will [place];Living will  Living will;Healthcare Power of [place] in Chart? No - copy requested No - copy requested No - copy requested  No - copy requested    Would patient like information on creating a medical advance directive?   No - Patient declined    No - patient declined information    Current Medications (verified) Outpatient Encounter Medications as of 01/18/2022  Medication Sig   acetaminophen (TYLENOL) 650 MG CR tablet Take 650 mg by mouth 2 (two) times daily.    bifidobacterium infantis (ALIGN) capsule Take 1 capsule by mouth daily.   cholecalciferol (VITAMIN D) 1000 UNITS tablet Take 2,000 Units by mouth daily.   co-enzyme Q-10 50 MG capsule Take 50 mg by mouth daily.   diltiazem (CARDIZEM CD) 180 MG 24 hr capsule TAKE 1 CAPSULE BY MOUTH TWICE A DAY   diltiazem (CARDIZEM) 30 MG tablet TAKE 1 TABLET EVERY 4 HOURS AS NEEDED FOR AFIB HEART RATE >100   hydrochlorothiazide (HYDRODIURIL) 25 MG  tablet Take 1 tablet (25 mg total) by mouth daily. Follow-up appt is due must see provider for future refills   KLOR-CON M10 10 MEQ tablet TAKE 1 TABLET BY MOUTH THREE TIMES A DAY   lisinopril (ZESTRIL) 40 MG tablet Take 1 tablet (40 mg total) by mouth daily. Follow-up appt is due must see provider for future refills   magnesium oxide (MAG-OX) 400 MG tablet Take 400 mg by mouth daily.   pravastatin (PRAVACHOL) 40 MG tablet Take 1 tablet (40 mg total) by mouth daily.   traMADol (ULTRAM) 50 MG tablet Take 1 tablet (50 mg total) by mouth every 12 (twelve) hours as needed.   XARELTO 20 MG TABS tablet TAKE 1 TABLET BY MOUTH DAILY WITH SUPPER   cyanocobalamin 1000 MCG tablet Take 1,000 mcg by mouth daily. (Patient not taking: Reported on 01/18/2022)   No facility-administered encounter medications on file as of 01/18/2022.    Allergies (verified) Nickel   History: Past Medical History:  Diagnosis Date   ARTHRITIS, HIPS, BILATERAL    Atrial fibrillation (HCC)    chronic anticoag - Pradaxa   BURSITIS, SUBTROCHANTERIC    HYPERTENSION    MITRAL VALVE PROLAPSE    Mixed hyperlipidemia    Past Surgical History:  Procedure Laterality Date   DILATION AND CURETTAGE OF UTERUS     TONSILLECTOMY  Family History  Problem Relation Age of Onset   Hypertension Mother    Heart failure Mother    Leukemia Father    Social History   Socioeconomic History   Marital status: Married    Spouse name: Not on file   Number of children: 1   Years of education: Not on file   Highest education level: Not on file  Occupational History   Occupation: Retired  Tobacco Use   Smoking status: Former    Years: 1.00    Types: Cigarettes   Smokeless tobacco: Never   Tobacco comments:    social age 4  Vaping Use   Vaping Use: Never used  Substance and Sexual Activity   Alcohol use: No   Drug use: No   Sexual activity: Yes  Other Topics Concern   Not on file  Social History Narrative   Not on file    Social Determinants of Health   Financial Resource Strain: Low Risk  (01/18/2022)   Overall Financial Resource Strain (CARDIA)    Difficulty of Paying Living Expenses: Not hard at all  Food Insecurity: No Food Insecurity (01/18/2022)   Hunger Vital Sign    Worried About Running Out of Food in the Last Year: Never true    [place] in the Last Year: Never true  Transportation Needs: No Transportation Needs (01/18/2022)   PRAPARE - Hydrologist (Medical): No    Lack of Transportation (Non-Medical): No  Physical Activity: Insufficiently Active (01/18/2022)   Exercise Vital Sign    Days of Exercise per Week: 2 days    Minutes of Exercise per Session: 60 min  Stress: No Stress Concern Present (01/18/2022)   [place]    Feeling of Stress : Not at all  Social Connections: Moderately Isolated (01/18/2022)   Social Connection and Isolation Panel [NHANES]    Frequency of Communication with Friends and Family: More than three times a week    Frequency of Social Gatherings with Friends and Family: More than three times a week    Attends Religious Services: Never    Marine scientist or Organizations: No    Attends Music therapist: Never    Marital Status: Married    Tobacco Counseling Counseling given: Not Answered Tobacco comments: social age 66   Clinical Intake:  Pre-visit preparation completed: Yes  Pain : 0-10 Pain Score: 3  Pain Location: Shoulder Pain Orientation: Left Pain Descriptors / Indicators: Aching, Stabbing     Nutritional Risks: None Diabetes: No  How often do you need to have someone help you when you read instructions, pamphlets, or other written materials from your doctor or pharmacy?: 1 - Never What is the last grade level you completed in school?: 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 no  Interpreter Needed?:  No  Information entered by :: Jillene Bucks, [place]   Activities of Daily Living    01/18/2022    5:04 PM  In your present state of health, do you have any difficulty performing the following activities:  Hearing? 1  Comment slightly since her eardrum burst from ear infection  Vision? 0  Difficulty concentrating or making decisions? 1  Comment sometimes difficulty remembering  Walking or climbing stairs? 0  Dressing or bathing? 0  Doing errands, shopping? 0    Patient Care Team: Binnie Rail, MD as PCP - General (Internal Medicine)  Almedia Balls, MD (Orthopedic Surgery) Maisie Fus, MD (Obstetrics and Gynecology) Thompson Grayer, MD (Cardiology) Erline Levine, MD (Neurosurgery) Stefanie Libel, MD (Sports Medicine) Richmond Campbell, MD (Gastroenterology) Patsey Berthold, NP as Nurse Practitioner (Cardiology) Izora Gala, MD as Consulting Physician (Otolaryngology) Luberta Mutter, MD as Consulting Physician (Ophthalmology)  Indicate any recent Medical Services you may have received from other than [facility] providers in the past year (date may be approximate).     Assessment:   This is a routine wellness examination for [place].  Hearing/Vision screen Patient has slight hearing difficulty. Patient does not wear any corrective lenses/contacts.    Dietary issues and exercise activities discussed:     Goals Addressed               This Visit's Progress     Patient Stated (pt-stated)        I would like to get my energy back        Depression Screen    01/18/2022    5:01 PM 11/17/2020    8:08 AM 06/20/2019   10:40 AM 06/20/2019   10:30 AM 04/18/2018    8:26 AM 05/15/2017    3:40 PM 10/11/2016    9:57 AM  PHQ 2/9 Scores  PHQ - 2 Score 0 0 0 0 0 0 0  PHQ- 9 Score  3         Fall Risk    01/18/2022    5:04 PM 11/17/2020    8:04 AM 07/02/2019    5:22 PM 06/20/2019   10:39 AM 06/20/2019   10:29 AM  Fall Risk   Falls in the past year? 0 0 0 0 0  Comment   Emmi  Telephone Survey: data to providers prior to load    Number falls in past yr: 0 0  0 0  Injury with Fall? 0 0  0 0  Risk for fall due to : No Fall Risks No Fall Risks     Follow up Falls evaluation completed Falls evaluation completed  Falls prevention discussed     FALL RISK PREVENTION PERTAINING TO THE HOME:  Any stairs in or around the home? Yes  If so, are there any without handrails? No  Home free of loose throw rugs in walkways, pet beds, electrical cords, etc? No  Adequate lighting in your home to reduce risk of falls? Yes   ASSISTIVE DEVICES UTILIZED TO PREVENT FALLS:  Life alert? No  Use of a cane, walker or w/c? No  Grab bars in the bathroom? No  Shower chair or bench in shower? Yes  Elevated toilet seat or a handicapped toilet? No   TIMED UP AND GO:  Was the test performed? No phone visit.  Length of time to ambulate 10 feet: N/A sec.    Cognitive Function:    04/18/2018    8:45 AM  MMSE - Mini Mental State Exam  Orientation to time 5  Orientation to Place 5  Registration 3  Attention/ Calculation 5  Recall 3  Language- name 2 objects 2  Language- repeat 1  Language- follow 3 step command 3  Language- read & follow direction 1  Write a sentence 1  Copy design 1  Total score 30        01/18/2022    5:05 PM 06/20/2019   10:37 AM  6CIT Screen  What Year? 0 points 0 points  What month? 0 points 0 points  What time? 0 points 0 points  Count  back from 20 0 points 0 points  Months in reverse 0 points 0 points  Repeat phrase 0 points 0 points  Total Score 0 points 0 points    Immunizations Immunization History  Administered Date(s) Administered   Influenza Split 04/07/2013   Influenza, High Dose Seasonal PF 05/16/2016, 05/15/2017, 04/12/2018, 05/10/2019, 05/18/2020   Influenza,inj,Quad PF,6+ Mos 05/03/2015   Influenza-Unspecified 05/07/2014, 06/14/2021   PFIZER Comirnaty(Gray Top)Covid-19 Tri-Sucrose Vaccine 01/28/2021   PFIZER(Purple  Top)SARS-COV-2 Vaccination 08/27/2019, 09/17/2019, 05/28/2020   Pneumococcal Conjugate-13 10/14/2014   Pneumococcal Polysaccharide-23 05/15/2012   Td 12/05/2005, 08/12/2012   Zoster Recombinat (Shingrix) 10/24/2017, 01/04/2018   Zoster, Live 08/07/2006    TDAP status: Up to date  Flu Vaccine status: Up to date  Pneumococcal vaccine status: Up to date  Covid-19 vaccine status: Completed vaccines  Qualifies for Shingles Vaccine? Yes   Zostavax completed No   Shingrix Completed?: Yes  Screening Tests Health Maintenance  Topic Date Due   MAMMOGRAM  02/27/2021   COVID-19 Vaccine (5 - Booster for Pfizer series) 02/03/2022 (Originally 03/25/2021)   INFLUENZA VACCINE  03/07/2022   DEXA SCAN  05/24/2022   TETANUS/TDAP  08/12/2022   COLONOSCOPY (Pts 45-32yr Insurance coverage will need to be confirmed)  12/07/2031   Pneumonia Vaccine 75 Years old  Completed   Hepatitis C Screening  Completed   Zoster Vaccines- Shingrix  Completed   HPV VACCINES  Aged Out    Health Maintenance  Health Maintenance Due  Topic Date Due   MAMMOGRAM  02/27/2021    Colorectal cancer screening: Type of screening: Colonoscopy. Completed 12/06/21. Repeat every 10 years  Mammogram Status: According to HM it is due but patient reports she gets them done every year. We will need to get records.   Bone Density status: Completed 05/24/2017. Results reflect: Bone density results: NORMAL. Repeat every 5 years.  Lung Cancer Screening: (Low Dose CT Chest recommended if Age 75-80years, 30 pack-year currently smoking OR have quit w/in 15years.) does not qualify.   Lung Cancer Screening Referral: N/A  Additional Screening:  Hepatitis C Screening: does qualify; Completed 03/28/2016  Vision Screening: Recommended annual ophthalmology exams for early detection of glaucoma and other disorders of the eye. Is the patient up to date with their annual eye exam?  Yes  Who is the provider or what is the name of the  office in which the patient attends annual [facility] exams[facility] If pt is not established with a provider, would they like to be referred to a provider to establish care? No .   Dental Screening: Recommended annual dental exams for proper oral hygiene  Community Resource Referral / Chronic Care Management: CRR required this visit?  No   CCM required this visit?  No      Plan:     I have personally reviewed and noted the following in the patient's chart:   Medical and social history Use of alcohol, tobacco or illicit drugs  Current medications and supplements including opioid prescriptions.  Functional ability and status Nutritional status Physical activity Advanced directives List of other physicians Hospitalizations, surgeries, and ER visits in previous 12 months Vitals Screenings to include cognitive, depression, and falls Referrals and appointments  In addition, I have reviewed and discussed with patient certain preventive protocols, quality metrics, and best practice recommendations. A written personalized care plan for preventive services as well as general preventive health recommendations were provided to patient.     HRossie Muskrat CMA   01/18/2022  Nurse Notes:   We will need to obtain mammogram records.    Time Spent with patient on telephone encounter: 26 mins

## 2022-01-18 NOTE — Patient Instructions (Signed)
It was great speaking with you today!  Please schedule your next Medicare Wellness Visit with your Nurse Health Advisor in 1 year by calling [phone]. 

## 2022-01-22 ENCOUNTER — Encounter: Payer: Self-pay | Admitting: Internal Medicine

## 2022-01-22 DIAGNOSIS — E559 Vitamin D deficiency, unspecified: Secondary | ICD-10-CM | POA: Insufficient documentation

## 2022-01-22 NOTE — Progress Notes (Unsigned)
Subjective:    Patient ID: [name], female    DOB: 1945-11-24, 76 y.o.   MRN: [id number]     HPI [name] is here for follow up of her chronic medical problems, including htn, Afib, hld, prediabetes, chronic OA ( hips /feet)  Having knee pain - to see ortho.    Medications and allergies reviewed with patient and updated if appropriate.  Current Outpatient Medications on File Prior to Visit  Medication Sig Dispense Refill   acetaminophen (TYLENOL) 650 MG CR tablet Take 650 mg by mouth 2 (two) times daily.      bifidobacterium infantis (ALIGN) capsule Take 1 capsule by mouth daily. 30 capsule 11   cholecalciferol (VITAMIN D) 1000 UNITS tablet Take 2,000 Units by mouth daily.     co-enzyme Q-10 50 MG capsule Take 50 mg by mouth daily.     cyanocobalamin 1000 MCG tablet Take 1,000 mcg by mouth daily.     diltiazem (CARDIZEM CD) 180 MG 24 hr capsule TAKE 1 CAPSULE BY MOUTH TWICE A DAY 180 capsule 1   diltiazem (CARDIZEM) 30 MG tablet TAKE 1 TABLET EVERY 4 HOURS AS NEEDED FOR AFIB HEART RATE >100 45 tablet 3   hydrochlorothiazide (HYDRODIURIL) 25 MG tablet Take 1 tablet (25 mg total) by mouth daily. Follow-up appt is due must see provider for future refills 30 tablet 0   KLOR-CON M10 10 MEQ tablet TAKE 1 TABLET BY MOUTH THREE TIMES A DAY 270 tablet 1   lisinopril (ZESTRIL) 40 MG tablet Take 1 tablet (40 mg total) by mouth daily. Follow-up appt is due must see provider for future refills 30 tablet 0   magnesium oxide (MAG-OX) 400 MG tablet Take 400 mg by mouth daily.     pravastatin (PRAVACHOL) 40 MG tablet Take 1 tablet (40 mg total) by mouth daily. 90 tablet 3   traMADol (ULTRAM) 50 MG tablet Take 1 tablet (50 mg total) by mouth every 12 (twelve) hours as needed. 180 tablet 0   XARELTO 20 MG TABS tablet TAKE 1 TABLET BY MOUTH DAILY WITH SUPPER 90 tablet 1   No current facility-administered medications on file prior to visit.     Review of Systems  Constitutional:   Negative for fever.  Respiratory:  Negative for cough, shortness of breath and wheezing.   Cardiovascular:  Negative for chest pain, palpitations and leg swelling.  Neurological:  Negative for light-headedness and headaches.  Psychiatric/Behavioral:  Positive for sleep disturbance.        Objective:   Vitals:   01/23/22 1437  BP: 120/66  Pulse: 89  Temp: 98.4 F (36.9 C)  SpO2: 96%   BP Readings from Last 3 Encounters:  01/23/22 120/66  11/23/21 (!) 142/77  09/09/21 (!) 127/57   Wt Readings from Last 3 Encounters:  01/23/22 159 lb (72.1 kg)  11/23/21 160 lb (72.6 kg)  08/29/21 158 lb 12.8 oz (72 kg)   Body mass index is 27.29 kg/m.    Physical Exam Constitutional:      General: She is not in acute distress.    Appearance: Normal appearance.  HENT:     Head: Normocephalic and atraumatic.  Eyes:     Conjunctiva/sclera: Conjunctivae normal.  Cardiovascular:     Rate and Rhythm: Normal rate. Rhythm irregular.     Heart sounds: Normal heart sounds. No murmur heard. Pulmonary:     Effort: Pulmonary effort is normal. No respiratory distress.     Breath sounds: Normal  breath sounds. No wheezing.  Musculoskeletal:     Cervical back: Neck supple.     Right lower leg: No edema.     Left lower leg: No edema.  Lymphadenopathy:     Cervical: No cervical adenopathy.  Skin:    General: Skin is warm and dry.     Findings: No rash.  Neurological:     Mental Status: She is alert. Mental status is at baseline.  Psychiatric:        Mood and Affect: Mood normal.        Behavior: Behavior normal.        Lab Results  Component Value Date   WBC 6.7 05/27/2021   HGB 14.4 05/27/2021   HCT 41.3 05/27/2021   PLT 250 05/27/2021   GLUCOSE 87 05/27/2021   CHOL 204 (H) 11/17/2020   TRIG 148.0 11/17/2020   HDL 64.70 11/17/2020   LDLDIRECT 114.0 07/12/2018   LDLCALC 109 (H) 11/17/2020   ALT 13 11/17/2020   AST 14 11/17/2020   NA 141 05/27/2021   K 4.1 05/27/2021   CL 102  05/27/2021   CREATININE 0.86 05/27/2021   BUN 22 05/27/2021   CO2 24 05/27/2021   TSH 1.71 11/17/2020   INR 1.4 01/25/2010   HGBA1C 5.2 11/17/2020     Assessment & Plan:    See Problem List for Assessment and Plan of chronic medical problems.

## 2022-01-22 NOTE — Patient Instructions (Addendum)
     Blood work was ordered.     Medications changes include :      Your prescription(s) have been sent to your pharmacy.    A referral was ordered for XX.     Someone from that office will call you to schedule an appointment.    Return in about 6 months (around 07/25/2022) for follow up.

## 2022-01-23 ENCOUNTER — Ambulatory Visit (INDEPENDENT_AMBULATORY_CARE_PROVIDER_SITE_OTHER): Payer: Medicare Other | Admitting: Internal Medicine

## 2022-01-23 VITALS — BP 120/66 | HR 89 | Temp 98.4°F | Ht 64.0 in | Wt 159.0 lb

## 2022-01-23 DIAGNOSIS — E782 Mixed hyperlipidemia: Secondary | ICD-10-CM

## 2022-01-23 DIAGNOSIS — R7303 Prediabetes: Secondary | ICD-10-CM

## 2022-01-23 DIAGNOSIS — I4821 Permanent atrial fibrillation: Secondary | ICD-10-CM

## 2022-01-23 DIAGNOSIS — I10 Essential (primary) hypertension: Secondary | ICD-10-CM

## 2022-01-23 DIAGNOSIS — M150 Primary generalized (osteo)arthritis: Secondary | ICD-10-CM | POA: Diagnosis not present

## 2022-01-23 DIAGNOSIS — E559 Vitamin D deficiency, unspecified: Secondary | ICD-10-CM

## 2022-01-23 DIAGNOSIS — E538 Deficiency of other specified B group vitamins: Secondary | ICD-10-CM

## 2022-01-23 DIAGNOSIS — D6869 Other thrombophilia: Secondary | ICD-10-CM

## 2022-01-23 LAB — CBC WITH DIFFERENTIAL/PLATELET
Basophils Absolute: 0 10*3/uL (ref 0.0–0.1)
Basophils Relative: 0.4 % (ref 0.0–3.0)
Eosinophils Absolute: 0.1 10*3/uL (ref 0.0–0.7)
Eosinophils Relative: 0.7 % (ref 0.0–5.0)
HCT: 41.5 % (ref 36.0–46.0)
Hemoglobin: 14.1 g/dL (ref 12.0–15.0)
Lymphocytes Relative: 22.4 % (ref 12.0–46.0)
Lymphs Abs: 2.4 10*3/uL (ref 0.7–4.0)
MCHC: 34 g/dL (ref 30.0–36.0)
MCV: 95.6 fl (ref 78.0–100.0)
Monocytes Absolute: 1 10*3/uL (ref 0.1–1.0)
Monocytes Relative: 9.1 % (ref 3.0–12.0)
Neutro Abs: 7.1 10*3/uL (ref 1.4–7.7)
Neutrophils Relative %: 67.4 % (ref 43.0–77.0)
Platelets: 253 10*3/uL (ref 150.0–400.0)
RBC: 4.34 Mil/uL (ref 3.87–5.11)
RDW: 13.4 % (ref 11.5–15.5)
WBC: 10.5 10*3/uL (ref 4.0–10.5)

## 2022-01-23 LAB — COMPREHENSIVE METABOLIC PANEL
ALT: 11 U/L (ref 0–35)
AST: 11 U/L (ref 0–37)
Albumin: 4.1 g/dL (ref 3.5–5.2)
Alkaline Phosphatase: 76 U/L (ref 39–117)
BUN: 21 mg/dL (ref 6–23)
CO2: 33 mEq/L — ABNORMAL HIGH (ref 19–32)
Calcium: 9.3 mg/dL (ref 8.4–10.5)
Chloride: 103 mEq/L (ref 96–112)
Creatinine, Ser: 0.74 mg/dL (ref 0.40–1.20)
GFR: 79.44 mL/min (ref >60.00)
Glucose, Bld: 93 mg/dL (ref 70–99)
Potassium: 4.4 mEq/L (ref 3.5–5.1)
Sodium: 139 mEq/L (ref 135–145)
Total Bilirubin: 0.3 mg/dL (ref 0.2–1.2)
Total Protein: 6.5 g/dL (ref 6.0–8.3)

## 2022-01-23 LAB — LIPID PANEL
Cholesterol: 191 mg/dL (ref 0–200)
HDL: 57.8 mg/dL (ref >39.00)
Total CHOL/HDL Ratio: 3
Triglycerides: 416 mg/dL — ABNORMAL HIGH (ref 0.0–149.0)

## 2022-01-23 LAB — VITAMIN D 25 HYDROXY (VIT D DEFICIENCY, FRACTURES): VITD: 57.9 ng/mL (ref 30.00–100.00)

## 2022-01-23 LAB — HEMOGLOBIN A1C: Hgb A1c MFr Bld: 5.9 % (ref 4.6–6.5)

## 2022-01-23 LAB — VITAMIN B12: Vitamin B-12: 414 pg/mL (ref 211–911)

## 2022-01-23 NOTE — Assessment & Plan Note (Signed)
Chronic Taking vitamin d daily Check vitamin d level  

## 2022-01-23 NOTE — Assessment & Plan Note (Signed)
Chronic following with cardio On xarelto 20 mg daily On diltiazem 180 mg daily Cbc, cmp

## 2022-01-23 NOTE — Assessment & Plan Note (Signed)
Chronic Check a1c Low sugar / carb diet Stressed regular exercise  

## 2022-01-23 NOTE — Assessment & Plan Note (Signed)
Chronic ?Ck B12 level ?

## 2022-01-23 NOTE — Assessment & Plan Note (Signed)
Chronic Check lipid panel  Continue pravastatin 40 mg daily Regular exercise and healthy diet encouraged  

## 2022-01-23 NOTE — Assessment & Plan Note (Addendum)
chronic Taking tramadol 50 mg Q 12 hr prn - does not take regularly Seeing orthopedics for her knee arthritis Just had an injection in her shoulder

## 2022-01-23 NOTE — Assessment & Plan Note (Signed)
Chronic Secondary to Afib On xarelto

## 2022-01-23 NOTE — Assessment & Plan Note (Signed)
Chronic BP well controlled Continue diltiazem 180 mg, hctz 25 mg, lisinopril 40 mg cmp

## 2022-01-24 DIAGNOSIS — Z961 Presence of intraocular lens: Secondary | ICD-10-CM | POA: Diagnosis not present

## 2022-01-24 LAB — LDL CHOLESTEROL, DIRECT: Direct LDL: 87 mg/dL

## 2022-01-25 DIAGNOSIS — M1712 Unilateral primary osteoarthritis, left knee: Secondary | ICD-10-CM | POA: Diagnosis not present

## 2022-01-25 DIAGNOSIS — M25562 Pain in left knee: Secondary | ICD-10-CM | POA: Diagnosis not present

## 2022-02-03 DIAGNOSIS — Z1231 Encounter for screening mammogram for malignant neoplasm of breast: Secondary | ICD-10-CM | POA: Diagnosis not present

## 2022-02-08 LAB — HM MAMMOGRAPHY

## 2022-02-13 ENCOUNTER — Encounter: Payer: Self-pay | Admitting: Internal Medicine

## 2022-02-13 NOTE — Progress Notes (Signed)
Outside notes received. Information abstracted. Notes sent to scan.  

## 2022-02-17 DIAGNOSIS — M25512 Pain in left shoulder: Secondary | ICD-10-CM | POA: Diagnosis not present

## 2022-02-17 DIAGNOSIS — M9907 Segmental and somatic dysfunction of upper extremity: Secondary | ICD-10-CM | POA: Diagnosis not present

## 2022-02-20 ENCOUNTER — Encounter: Payer: Self-pay | Admitting: Internal Medicine

## 2022-02-20 ENCOUNTER — Other Ambulatory Visit: Payer: Self-pay

## 2022-02-20 MED ORDER — DILTIAZEM HCL ER COATED BEADS 180 MG PO CP24: 180.0000 mg | ORAL_CAPSULE | Freq: Two times a day (BID) | ORAL | 3 refills | Status: DC

## 2022-02-21 ENCOUNTER — Other Ambulatory Visit: Payer: Self-pay

## 2022-02-21 MED ORDER — LISINOPRIL 40 MG PO TABS: 40.0000 mg | ORAL_TABLET | Freq: Every day | ORAL | 1 refills | Status: DC

## 2022-02-21 MED ORDER — HYDROCHLOROTHIAZIDE 25 MG PO TABS: 25.0000 mg | ORAL_TABLET | Freq: Every day | ORAL | 1 refills | Status: DC

## 2022-05-29 ENCOUNTER — Other Ambulatory Visit: Payer: Self-pay | Admitting: Physician Assistant

## 2022-05-29 NOTE — Telephone Encounter (Signed)
Prescription refill request for Xarelto received.  Indication:Afib Last office visit:4/23 Weight:72.1 kg Age:76 Scr:0.7 CrCl:79.04 ml/min  Prescription refilled

## 2022-05-30 ENCOUNTER — Ambulatory Visit: Payer: Medicare Other | Admitting: Cardiology

## 2022-05-31 DIAGNOSIS — Z23 Encounter for immunization: Secondary | ICD-10-CM | POA: Diagnosis not present

## 2022-06-05 DIAGNOSIS — M9901 Segmental and somatic dysfunction of cervical region: Secondary | ICD-10-CM | POA: Diagnosis not present

## 2022-06-05 DIAGNOSIS — R531 Weakness: Secondary | ICD-10-CM | POA: Diagnosis not present

## 2022-06-05 DIAGNOSIS — M542 Cervicalgia: Secondary | ICD-10-CM | POA: Diagnosis not present

## 2022-06-05 DIAGNOSIS — M9907 Segmental and somatic dysfunction of upper extremity: Secondary | ICD-10-CM | POA: Diagnosis not present

## 2022-06-05 DIAGNOSIS — M9906 Segmental and somatic dysfunction of lower extremity: Secondary | ICD-10-CM | POA: Diagnosis not present

## 2022-06-05 DIAGNOSIS — M25551 Pain in right hip: Secondary | ICD-10-CM | POA: Diagnosis not present

## 2022-06-05 DIAGNOSIS — M25512 Pain in left shoulder: Secondary | ICD-10-CM | POA: Diagnosis not present

## 2022-06-05 DIAGNOSIS — M9902 Segmental and somatic dysfunction of thoracic region: Secondary | ICD-10-CM | POA: Diagnosis not present

## 2022-06-05 DIAGNOSIS — M9904 Segmental and somatic dysfunction of sacral region: Secondary | ICD-10-CM | POA: Diagnosis not present

## 2022-06-05 DIAGNOSIS — M9905 Segmental and somatic dysfunction of pelvic region: Secondary | ICD-10-CM | POA: Diagnosis not present

## 2022-06-06 ENCOUNTER — Ambulatory Visit: Payer: Medicare Other | Attending: Cardiology | Admitting: Cardiology

## 2022-06-06 ENCOUNTER — Encounter: Payer: Self-pay | Admitting: Cardiology

## 2022-06-06 VITALS — BP 122/68 | HR 96 | Ht 64.0 in | Wt 156.8 lb

## 2022-06-06 DIAGNOSIS — I4821 Permanent atrial fibrillation: Secondary | ICD-10-CM | POA: Diagnosis not present

## 2022-06-06 DIAGNOSIS — I10 Essential (primary) hypertension: Secondary | ICD-10-CM | POA: Diagnosis not present

## 2022-06-06 MED ORDER — DILTIAZEM HCL 30 MG PO TABS: ORAL_TABLET | ORAL | 3 refills | Status: DC

## 2022-06-06 NOTE — Progress Notes (Signed)
Electrophysiology Office Follow up Visit Note:    Date:  06/06/2022   ID:  [name], DOB Oct 30, 1945, MRN [id number]  PCP:  Binnie Rail, MD  [facility] Cardiologist:  None  [facility] Electrophysiologist:  Vickie Epley, MD    Interval History:    [name] is a 76 y.o. female who presents for a follow up visit. They were last seen in clinic November 23, 2021 by [facility].  The patient was previously followed by Dr. Rayann Heman.  She has permanent atrial fibrillation.  She is on Xarelto for stroke prophylaxis.  Today, she presents feeling overall well. She reports that her heart rate is usually in the 70s at rest per her Apple watch.  She denies any palpitations, chest pain, shortness of breath, or peripheral edema. No lightheadedness, headaches, syncope, orthopnea, or PND.      Past Medical History:  Diagnosis Date   ARTHRITIS, HIPS, BILATERAL    Atrial fibrillation (HCC)    chronic anticoag - Pradaxa   BURSITIS, SUBTROCHANTERIC    HYPERTENSION    MITRAL VALVE PROLAPSE    Mixed hyperlipidemia     Past Surgical History:  Procedure Laterality Date   DILATION AND CURETTAGE OF UTERUS     TONSILLECTOMY      Current Medications: Current Meds  Medication Sig   acetaminophen (TYLENOL) 650 MG CR tablet Take 650 mg by mouth 2 (two) times daily.    bifidobacterium infantis (ALIGN) capsule Take 1 capsule by mouth daily.   cholecalciferol (VITAMIN D) 1000 UNITS tablet Take 2,000 Units by mouth daily.   co-enzyme Q-10 50 MG capsule Take 50 mg by mouth daily.   diltiazem (CARDIZEM CD) 180 MG 24 hr capsule Take 1 capsule (180 mg total) by mouth 2 (two) times daily.   diltiazem (CARDIZEM) 30 MG tablet TAKE 1 TABLET EVERY 4 HOURS AS NEEDED FOR AFIB HEART RATE >100   hydrochlorothiazide (HYDRODIURIL) 25 MG tablet Take 1 tablet (25 mg total) by mouth daily.   KLOR-CON M10 10 MEQ tablet TAKE 1 TABLET BY MOUTH THREE TIMES A DAY   lisinopril (ZESTRIL) 40 MG tablet Take  1 tablet (40 mg total) by mouth daily.   magnesium oxide (MAG-OX) 400 MG tablet Take 400 mg by mouth daily.   pravastatin (PRAVACHOL) 40 MG tablet Take 1 tablet (40 mg total) by mouth daily.   traMADol (ULTRAM) 50 MG tablet Take 1 tablet (50 mg total) by mouth every 12 (twelve) hours as needed.   XARELTO 20 MG TABS tablet TAKE 1 TABLET BY MOUTH DAILY WITH SUPPER     Allergies:   Nickel   Social History   Socioeconomic History   Marital status: Married    Spouse name: Not on file   Number of children: 1   Years of education: Not on file   Highest education level: Not on file  Occupational History   Occupation: Retired  Tobacco Use   Smoking status: Former    Years: 1.00    Types: Cigarettes   Smokeless tobacco: Never   Tobacco comments:    social age 67  Vaping Use   Vaping Use: Never used  Substance and Sexual Activity   Alcohol use: No   Drug use: No   Sexual activity: Yes  Other Topics Concern   Not on file  Social History Narrative   Not on file   Social Determinants of Health   Financial Resource Strain: Low Risk  (01/18/2022)   Overall Financial  Resource Strain (CARDIA)    Difficulty of Paying Living Expenses: Not hard at all  Food Insecurity: No Food Insecurity (01/18/2022)   Hunger Vital Sign    Worried About Running Out of Food in the Last Year: Never true    Ran Out of Food in the Last Year: Never true  Transportation Needs: No Transportation Needs (01/18/2022)   PRAPARE - Hydrologist (Medical): No    Lack of Transportation (Non-Medical): No  Physical Activity: Insufficiently Active (01/18/2022)   Exercise Vital Sign    Days of Exercise per Week: 2 days    Minutes of Exercise per Session: 60 min  Stress: No Stress Concern Present (01/18/2022)   [place]    Feeling of Stress : Not at all  Social Connections: Moderately Isolated (01/18/2022)   Social Connection  and Isolation Panel [NHANES]    Frequency of Communication with Friends and Family: More than three times a week    Frequency of Social Gatherings with Friends and Family: More than three times a week    Attends Religious Services: Never    Marine scientist or Organizations: No    Attends Music therapist: Never    Marital Status: Married     Family History: The patient's family history includes Heart failure in her mother; Hypertension in her mother; Leukemia in her father.  ROS:   Please see the history of present illness.    (+) All other systems reviewed and are negative.  EKGs/Labs/Other Studies Reviewed:    The following studies were reviewed today:  EKG: 06/06/2022: EKG was not ordered.   Recent Labs: 01/23/2022: ALT 11; BUN 21; Creatinine, Ser 0.74; Hemoglobin 14.1; Platelets 253.0; Potassium 4.4; Sodium 139  Recent Lipid Panel    Component Value Date/Time   CHOL 191 01/23/2022 1529   TRIG (H) 01/23/2022 1529    416.0 Triglyceride is over 400; calculations on Lipids are invalid.   HDL 57.80 01/23/2022 1529   CHOLHDL 3 01/23/2022 1529   VLDL 29.6 11/17/2020 0838   LDLCALC 109 (H) 11/17/2020 0838   LDLDIRECT 87.0 01/23/2022 1529    Physical Exam:    VS:  BP 122/68   Pulse 96   Ht '5\' 4"'$  (1.626 m)   Wt 156 lb 12.8 oz (71.1 kg)   SpO2 96%   BMI 26.91 kg/m     Wt Readings from Last 3 Encounters:  06/06/22 156 lb 12.8 oz (71.1 kg)  01/23/22 159 lb (72.1 kg)  11/23/21 160 lb (72.6 kg)     GEN: Well nourished, well developed in no acute distress.  Elderly HEENT: Normal NECK: No JVD; No carotid bruits LYMPHATICS: No lymphadenopathy CARDIAC: Irregularly irregular , no murmurs, rubs, gallops RESPIRATORY:  Clear to auscultation without rales, wheezing or rhonchi  ABDOMEN: Soft, non-tender, non-distended MUSCULOSKELETAL:  No edema; No deformity  SKIN: Warm and dry NEUROLOGIC:  Alert and oriented x 3 PSYCHIATRIC:  Normal affect         ASSESSMENT:    1. Permanent atrial fibrillation ([place])   2. Primary hypertension    PLAN:    In order of problems listed above:   #Permanent atrial fibrillation On Xarelto for stroke prophylaxis Rate controlled  #Hypertension At goal today.  Recommend checking blood pressures 1-2 times per week at home and recording the values.  Recommend bringing these recordings to the primary care physician.   Follow-up 1 year with APP.  Medication Adjustments/Labs and Tests Ordered: Current medicines are reviewed at length with the patient today.  Concerns regarding medicines are outlined above.  No orders of the defined types were placed in this encounter.  No orders of the defined types were placed in this encounter.   I,Rachel Rivera,acting as a scribe for Vickie Epley, MD.,have documented all relevant documentation on the behalf of Vickie Epley, MD,as directed by  Vickie Epley, MD while in the presence of Vickie Epley, MD.   I, Vickie Epley, MD, have reviewed all documentation for this visit. The documentation on 06/06/22 for the exam, diagnosis, procedures, and orders are all accurate and complete.    Signed, Lars Mage, MD, [facility], [facility] 06/06/2022 12:13 PM    Electrophysiology [facility]

## 2022-06-06 NOTE — Progress Notes (Deleted)
Electrophysiology Office Follow up Visit Note:    Date:  06/06/2022   ID:  [name], DOB 1946/08/03, MRN [id number]  PCP:  Binnie Rail, MD  [facility] Cardiologist:  None  [facility] Electrophysiologist:  None    Interval History:    [name] is a 76 y.o. female who presents for a follow up visit. They were last seen in clinic November 23, 2021 by [facility].  The patient was previously followed by Dr. Rayann Heman.  She has permanent atrial fibrillation.  She is on Xarelto for stroke prophylaxis.       Past Medical History:  Diagnosis Date   ARTHRITIS, HIPS, BILATERAL    Atrial fibrillation (HCC)    chronic anticoag - Pradaxa   BURSITIS, SUBTROCHANTERIC    HYPERTENSION    MITRAL VALVE PROLAPSE    Mixed hyperlipidemia     Past Surgical History:  Procedure Laterality Date   DILATION AND CURETTAGE OF UTERUS     TONSILLECTOMY      Current Medications: No outpatient medications have been marked as taking for the 06/06/22 encounter (Appointment) with Vickie Epley, MD.     Allergies:   Nickel   Social History   Socioeconomic History   Marital status: Married    Spouse name: Not on file   Number of children: 1   Years of education: Not on file   Highest education level: Not on file  Occupational History   Occupation: Retired  Tobacco Use   Smoking status: Former    Years: 1.00    Types: Cigarettes   Smokeless tobacco: Never   Tobacco comments:    social age 58  Vaping Use   Vaping Use: Never used  Substance and Sexual Activity   Alcohol use: No   Drug use: No   Sexual activity: Yes  Other Topics Concern   Not on file  Social History Narrative   Not on file   Social Determinants of Health   Financial Resource Strain: Low Risk  (01/18/2022)   Overall Financial Resource Strain (CARDIA)    Difficulty of Paying Living Expenses: Not hard at all  Food Insecurity: No Food Insecurity (01/18/2022)   Hunger Vital Sign    Worried About  Running Out of Food in the Last Year: Never true    [place] in the Last Year: Never true  Transportation Needs: No Transportation Needs (01/18/2022)   PRAPARE - Hydrologist (Medical): No    Lack of Transportation (Non-Medical): No  Physical Activity: Insufficiently Active (01/18/2022)   Exercise Vital Sign    Days of Exercise per Week: 2 days    Minutes of Exercise per Session: 60 min  Stress: No Stress Concern Present (01/18/2022)   [place]    Feeling of Stress : Not at all  Social Connections: Moderately Isolated (01/18/2022)   Social Connection and Isolation Panel [NHANES]    Frequency of Communication with Friends and Family: More than three times a week    Frequency of Social Gatherings with Friends and Family: More than three times a week    Attends Religious Services: Never    Marine scientist or Organizations: No    Attends Music therapist: Never    Marital Status: Married     Family History: The patient's family history includes Heart failure in her mother; Hypertension in her mother; Leukemia in her father.  ROS:   Please see the history of present illness.    All other systems reviewed and are negative.  EKGs/Labs/Other Studies Reviewed:    The following studies were reviewed today:    Recent Labs: 01/23/2022: ALT 11; BUN 21; Creatinine, Ser 0.74; Hemoglobin 14.1; Platelets 253.0; Potassium 4.4; Sodium 139  Recent Lipid Panel    Component Value Date/Time   CHOL 191 01/23/2022 1529   TRIG (H) 01/23/2022 1529    416.0 Triglyceride is over 400; calculations on Lipids are invalid.   HDL 57.80 01/23/2022 1529   CHOLHDL 3 01/23/2022 1529   VLDL 29.6 11/17/2020 0838   LDLCALC 109 (H) 11/17/2020 0838   LDLDIRECT 87.0 01/23/2022 1529    Physical Exam:    VS:  There were no vitals taken for this visit.    Wt Readings from Last 3 Encounters:   01/23/22 159 lb (72.1 kg)  11/23/21 160 lb (72.6 kg)  08/29/21 158 lb 12.8 oz (72 kg)     GEN: *** Well nourished, well developed in no acute distress HEENT: Normal NECK: No JVD; No carotid bruits LYMPHATICS: No lymphadenopathy CARDIAC: ***RRR, no murmurs, rubs, gallops RESPIRATORY:  Clear to auscultation without rales, wheezing or rhonchi  ABDOMEN: Soft, non-tender, non-distended MUSCULOSKELETAL:  No edema; No deformity  SKIN: Warm and dry NEUROLOGIC:  Alert and oriented x 3 PSYCHIATRIC:  Normal affect        ASSESSMENT:    No diagnosis found. PLAN:    In order of problems listed above:   #Permanent atrial fibrillation On Xarelto for stroke prophylaxis  #Hypertension *** goal today.  Recommend checking blood pressures 1-2 times per week at home and recording the values.  Recommend bringing these recordings to the primary care physician.   Follow-up 1 year with APP.   Total time spent with patient today *** minutes. This includes reviewing records, evaluating the patient and coordinating care.   Medication Adjustments/Labs and Tests Ordered: Current medicines are reviewed at length with the patient today.  Concerns regarding medicines are outlined above.  No orders of the defined types were placed in this encounter.  No orders of the defined types were placed in this encounter.    Signed, Lars Mage, MD, [facility], [facility] 06/06/2022 4:57 AM    Electrophysiology [facility]

## 2022-06-06 NOTE — Patient Instructions (Signed)
Medication Instructions:  No changes *If you need a refill on your cardiac medications before your next appointment, please call your pharmacy*   Lab Work: none If you have labs (blood work) drawn today and your tests are completely normal, you will receive your results only by: [place] (if you have MyChart) OR A paper copy in the mail If you have any lab test that is abnormal or we need to change your treatment, we will call you to review the results.   Testing/Procedures: none   Follow-Up: At [facility], you and your health needs are our priority.  As part of our continuing mission to provide you with exceptional heart care, we have created designated Provider Care Teams.  These Care Teams include your primary Cardiologist (physician) and Advanced Practice Providers (APPs -  Physician Assistants and Nurse Practitioners) who all work together to provide you with the care you need, when you need it.  We recommend signing up for the patient portal called "MyChart".  Sign up information is provided on this After Visit Summary.  MyChart is used to connect with patients for Virtual Visits (Telemedicine).  Patients are able to view lab/test results, encounter notes, upcoming appointments, etc.  Non-urgent messages can be sent to your provider as well.   To learn more about what you can do with MyChart, go to [email or web address].    Your next appointment:   1 year(s)  The format for your next appointment:   In Person  Provider:   You will see one of the following Advanced Practice Providers on your designated Care Team:   Tommye Standard, [place] Legrand Como "Jonni Sanger" Chalmers Cater, [place]      Other Instructions None   Important Information About Sugar

## 2022-06-08 ENCOUNTER — Other Ambulatory Visit: Payer: Self-pay | Admitting: Internal Medicine

## 2022-06-12 ENCOUNTER — Other Ambulatory Visit: Payer: Self-pay | Admitting: Internal Medicine

## 2022-06-14 ENCOUNTER — Other Ambulatory Visit: Payer: Self-pay | Admitting: Cardiology

## 2022-06-15 DIAGNOSIS — Z23 Encounter for immunization: Secondary | ICD-10-CM | POA: Diagnosis not present

## 2022-06-26 DIAGNOSIS — M25552 Pain in left hip: Secondary | ICD-10-CM | POA: Diagnosis not present

## 2022-06-26 DIAGNOSIS — M25551 Pain in right hip: Secondary | ICD-10-CM | POA: Diagnosis not present

## 2022-06-26 DIAGNOSIS — M160 Bilateral primary osteoarthritis of hip: Secondary | ICD-10-CM | POA: Diagnosis not present

## 2022-06-30 ENCOUNTER — Other Ambulatory Visit: Payer: Self-pay | Admitting: Internal Medicine

## 2022-07-10 DIAGNOSIS — M25552 Pain in left hip: Secondary | ICD-10-CM | POA: Diagnosis not present

## 2022-07-10 DIAGNOSIS — M9906 Segmental and somatic dysfunction of lower extremity: Secondary | ICD-10-CM | POA: Diagnosis not present

## 2022-07-10 DIAGNOSIS — M25551 Pain in right hip: Secondary | ICD-10-CM | POA: Diagnosis not present

## 2022-07-11 DIAGNOSIS — Z6825 Body mass index (BMI) 25.0-25.9, adult: Secondary | ICD-10-CM | POA: Diagnosis not present

## 2022-07-11 DIAGNOSIS — Z01419 Encounter for gynecological examination (general) (routine) without abnormal findings: Secondary | ICD-10-CM | POA: Diagnosis not present

## 2022-07-14 ENCOUNTER — Telehealth: Payer: Self-pay

## 2022-07-14 NOTE — Telephone Encounter (Signed)
Pt states: -She was referred to [facility] for physical therapy -Has not heard anything.  Pt requests: -Return call with update.   Pt understands that referral from outside [facility] can take a little longer than inside because of faxing.

## 2022-07-17 ENCOUNTER — Telehealth: Payer: Self-pay | Admitting: Internal Medicine

## 2022-07-17 NOTE — Telephone Encounter (Signed)
Telephone note has been added to her account - Dr Kathreen Cornfield office was contacted for referral status- awaiting response.

## 2022-07-17 NOTE — Telephone Encounter (Signed)
Patient called office asking to be sch for PT- Patient states referral should Come from Dr Paulla Fore. Dr Kathreen Cornfield office was contacted and a voice mail was left for them to contact [place] back at [facility] with referral status.

## 2022-07-20 ENCOUNTER — Ambulatory Visit (INDEPENDENT_AMBULATORY_CARE_PROVIDER_SITE_OTHER): Payer: Medicare Other | Admitting: Physical Therapy

## 2022-07-20 ENCOUNTER — Encounter: Payer: Self-pay | Admitting: Physical Therapy

## 2022-07-20 DIAGNOSIS — M25551 Pain in right hip: Secondary | ICD-10-CM

## 2022-07-20 DIAGNOSIS — M6281 Muscle weakness (generalized): Secondary | ICD-10-CM

## 2022-07-20 NOTE — Therapy (Signed)
OUTPATIENT PHYSICAL THERAPY LOWER EXTREMITY EVALUATION   Patient Name: [name] MRN: [id number] DOB:[birth year], 76 y.o., female Today's Date: 07/20/2022  END OF SESSION:  PT End of Session - 07/20/22 1534     Visit Number 1    Number of Visits 12    Date for PT Re-Evaluation 08/31/22    Authorization Type Medicare    PT Start Time 1104    PT Stop Time 1146    PT Time Calculation (min) 42 min    Activity Tolerance Patient tolerated treatment well    Behavior During Therapy WFL for tasks assessed/performed             Past Medical History:  Diagnosis Date   ARTHRITIS, HIPS, BILATERAL    Atrial fibrillation ([place])    chronic anticoag - Pradaxa   BURSITIS, SUBTROCHANTERIC    HYPERTENSION    MITRAL VALVE PROLAPSE    Mixed hyperlipidemia    Past Surgical History:  Procedure Laterality Date   DILATION AND CURETTAGE OF UTERUS     TONSILLECTOMY     Patient Active Problem List   Diagnosis Date Noted   Vitamin D deficiency 01/22/2022   Secondary hypercoagulable state ([place]) 11/17/2020   Nasal lesion 11/08/2020   B12 deficiency 10/28/2019   IBS (irritable bowel syndrome) 10/28/2019   Arthritis of right hand 07/22/2019   Osteoarthritis of hand 06/05/2019   Presbycusis of both ears 05/14/2018   ETD (Eustachian tube dysfunction), bilateral 04/12/2018   Prediabetes 05/15/2017   Pain of right heel 01/09/2017   Primary osteoarthritis of both feet 07/10/2016   Primary osteoarthritis of both hands 07/10/2016   Alopecia totalis 06/05/2016   H/O dysplastic nevus 08/03/2015   Obese 10/14/2014   Degenerative lumbar disc 12/04/2013   Hematuria, undiagnosed cause 09/02/2013   Osteopenia 06/11/2013   Primary generalized (osteo)arthritis 12/18/2011   Mixed hyperlipidemia 03/25/2009   Essential hypertension 03/24/2009   ATRIAL FIBRILLATION 03/24/2009   Greater trochanteric bursitis of left hip 02/12/2007    PCP: Billey Gosling  REFERRING PROVIDER: Dr. Teresa Coombs  REFERRING DIAG: R hip pain  THERAPY DIAG:  Pain in right hip  Muscle weakness (generalized)  Rationale for Evaluation and Treatment: Rehabilitation  ONSET DATE:   SUBJECTIVE:   SUBJECTIVE STATEMENT: Pt states ongoing pain and weakness in hip and legs. She has pain in lateral R hip. States  Difficulty getting up from chair, needs to use hands, feels weak.  Has had previous injections in hip, had a few. Did help.  R leg very weak. Stairs painful. L hip: no pain.  Back: gets tired with walking, can walk 2 miles, states no actual pain.   Stairs: to get into house, 2 steps, low no rails, has had on fridge/wall   PERTINENT HISTORY:    PAIN:  Are you having pain? Yes: NPRS scale: 6/10 Pain location: R hip  Pain description: weak, sore  Aggravating factors: increased activity Relieving factors: none stated    PRECAUTIONS: None  WEIGHT BEARING RESTRICTIONS: No  FALLS:  Has patient fallen in last 6 months? No  PLOF: Independent   PATIENT GOALS: improved strength of hip, get up and down easier, stairs easier, less pain    OBJECTIVE:   DIAGNOSTIC FINDINGS:  MR pelvis.  IMPRESSION: 1. No acute injury of the pelvis. 2. Lower lumbar spine spondylosis.   COGNITION: Overall cognitive status: Within functional limits for tasks assessed     SENSATION:WFL  POSTURE: No Significant postural limitations  PALPATION: most pain and  tenderness in R gr troc, into ITB. Mild soreness in distal rotators, minimal pain in rest of glute,  No pain in anterior hip.    LOWER EXTREMITY ROM:  Lumbar: mild limitation for flex, ext  Hips: WFL Knees WFL   LOWER EXTREMITY MMT:  MMT Right eval Left eval  Hip flexion 3- 3  Hip extension    Hip abduction 3- 3  Hip adduction    Hip internal rotation    Hip external rotation 3- 4-  Knee flexion 4- 5  Knee extension 4 5  Ankle dorsiflexion wfl wfl  Ankle plantarflexion wfl wfl  Ankle inversion    Ankle eversion      (Blank rows = not tested)  LOWER EXTREMITY SPECIAL TESTS:    FUNCTIONAL TESTS:    GAIT: Distance walked: 100 ft Assistive device utilized: None Level of assistance: Complete Independence Comments: mild limp, inc trunk SB due to hip weakness.    TODAY'S TREATMENT:                                                                                                                              DATE:   07/20/22: Ther ex: See below for HEP   PATIENT EDUCATION:  Education details: PT POC, Exam findings, HEP Person educated: Patient Education method: Explanation, Demonstration, Tactile cues, Verbal cues, and Handouts Education comprehension: verbalized understanding, returned demonstration, verbal cues required, tactile cues required, and needs further education  HOME EXERCISE PROGRAM: Access Code: [email or web address] URL: [email or web address] Date: 07/20/2022 Prepared by: Lyndee Hensen  Exercises - Clamshell  - 1 x daily - 1-2 sets - 10 reps - Straight Leg Raise  - 1 x daily - 2 sets - 5- 10 reps - Supine Bridge  - 1 x daily - 1-2 sets - 5- 10 reps - Sit to Stand  - 1 x daily - 1-2 sets - 5- 10 reps - Standing Hip Abduction with Counter Support  - 1 x daily - 1-2 sets - 5-10 reps  ASSESSMENT:  CLINICAL IMPRESSION: Patient presents with primary complaint of increased pain and weakness in R hip. She has significant weakness in R hip, moderate in L. She has lack of effective HEP for this ongoing weakness. She has increased pain in lateral hip, and decreased ability for transfers, stairs, and standing activity, due to pain and weakness. Pt to benefit from skilled PT to improve pain and improve ability for functional activity.   OBJECTIVE IMPAIRMENTS: Abnormal gait, decreased activity tolerance, decreased endurance, decreased knowledge of use of DME, decreased mobility, difficulty walking, decreased ROM, decreased strength, increased muscle spasms, improper body mechanics, and  pain.   ACTIVITY LIMITATIONS: carrying, bending, standing, stairs, transfers, bed mobility, dressing, and locomotion level  PARTICIPATION LIMITATIONS: meal prep, cleaning, laundry, driving, shopping, and community activity  PERSONAL FACTORS: Time since onset of injury/illness/exacerbation are also affecting patient's functional outcome.   REHAB POTENTIAL: Good  CLINICAL DECISION MAKING: Stable/uncomplicated  EVALUATION COMPLEXITY: Low  GOALS: Goals reviewed with patient? Yes  SHORT TERM GOALS: Target date: 08/03/2022   Pt to be independent with initial HEP  Goal status: INITIAL  2.  Pt to demo optimal mechanics for sit to stand from higher chair.   Goal status: INITIAL    LONG TERM GOALS: Target date: 08/31/2022   Pt to be independent with final HEP  Goal status: INITIAL  2.  Pt to demo improved strength of R hip to at least 4/5 to improve stability and ability for stairs.    Goal status: INITIAL  3.  Pt to demo ability for sit to stand from regular chair height with minimal or no UE support.   Goal status: INITIAL  4.  Pt to report decreased pain in R hip to 0-2/10 with standing and walking activity.   Goal status: INITIAL  5.  Pt to demo ability for stair climbing, reciprocal pattern, with 1 UE support, to improve ability for home and community navigation.   Goal status: INITIAL    PLAN:  PT FREQUENCY: 1-2x/week  PT DURATION: 6 weeks  PLANNED INTERVENTIONS: Therapeutic exercises, Therapeutic activity, Neuromuscular re-education, Balance training, Gait training, Patient/Family education, Self Care, Joint mobilization, Joint manipulation, Stair training, DME instructions, Dry Needling, Electrical stimulation, Spinal manipulation, Spinal mobilization, Cryotherapy, Moist heat, Taping, Vasopneumatic device, Traction, Ultrasound, Ionotophoresis '4mg'$ /ml Dexamethasone, and Manual therapy  PLAN FOR NEXT SESSION:   Lyndee Hensen, PT, DPT 3:46 PM   07/20/22

## 2022-07-24 NOTE — Patient Instructions (Addendum)
      Blood work was ordered.   The lab is on the first floor.    Medications changes include :   None      Return in about 6 months (around 01/24/2023) for follow up.

## 2022-07-24 NOTE — Progress Notes (Signed)
Subjective:    Patient ID: [name], female    DOB: 03-27-1946, 76 y.o.   MRN: [id number]     HPI [name] is here for follow up of her chronic medical problems, including htn, afib, hld, prediabetes, chronic OA (hips, feet), knee pain  In PT now - to help strengthen her leg muscles    Medications and allergies reviewed with patient and updated if appropriate.  Current Outpatient Medications on File Prior to Visit  Medication Sig Dispense Refill   acetaminophen (TYLENOL) 650 MG CR tablet Take 650 mg by mouth 2 (two) times daily.      bifidobacterium infantis (ALIGN) capsule Take 1 capsule by mouth daily. 30 capsule 11   cholecalciferol (VITAMIN D) 1000 UNITS tablet Take 2,000 Units by mouth daily.     co-enzyme Q-10 50 MG capsule Take 50 mg by mouth daily.     diltiazem (CARDIZEM CD) 180 MG 24 hr capsule Take 1 capsule (180 mg total) by mouth 2 (two) times daily. 180 capsule 3   diltiazem (CARDIZEM) 30 MG tablet TAKE 1 TABLET EVERY 4 HOURS AS NEEDED FOR AFIB HEART RATE >100 120 tablet 7   hydrochlorothiazide (HYDRODIURIL) 25 MG tablet Take 1 tablet (25 mg total) by mouth daily. 90 tablet 1   KLOR-CON M10 10 MEQ tablet TAKE 1 TABLET BY MOUTH THREE TIMES A DAY 270 tablet 1   lisinopril (ZESTRIL) 40 MG tablet Take 1 tablet (40 mg total) by mouth daily. 90 tablet 1   magnesium oxide (MAG-OX) 400 MG tablet Take 400 mg by mouth daily.     pravastatin (PRAVACHOL) 40 MG tablet TAKE 1 TABLET BY MOUTH EVERY DAY 90 tablet 3   traMADol (ULTRAM) 50 MG tablet TAKE 1 TABLET BY MOUTH EVERY 12 HOURS AS NEEDED. 180 tablet 0   XARELTO 20 MG TABS tablet TAKE 1 TABLET BY MOUTH DAILY WITH SUPPER 90 tablet 1   No current facility-administered medications on file prior to visit.     Review of Systems  Constitutional:  Positive for fatigue. Negative for chills and fever.  Respiratory:  Negative for cough, shortness of breath and wheezing.   Cardiovascular:  Negative for chest pain,  palpitations and leg swelling.  Musculoskeletal:  Positive for arthralgias.  Neurological:  Negative for light-headedness and headaches.       Objective:   Vitals:   07/25/22 0843  BP: 120/76  Pulse: 90  Temp: 97.9 F (36.6 C)  SpO2: 98%   BP Readings from Last 3 Encounters:  07/25/22 120/76  06/06/22 122/68  01/23/22 120/66   Wt Readings from Last 3 Encounters:  07/25/22 161 lb (73 kg)  06/06/22 156 lb 12.8 oz (71.1 kg)  01/23/22 159 lb (72.1 kg)   Body mass index is 27.64 kg/m.    Physical Exam Constitutional:      General: She is not in acute distress.    Appearance: Normal appearance.  HENT:     Head: Normocephalic and atraumatic.  Eyes:     Conjunctiva/sclera: Conjunctivae normal.  Cardiovascular:     Rate and Rhythm: Normal rate and regular rhythm.     Heart sounds: Normal heart sounds. No murmur heard. Pulmonary:     Effort: Pulmonary effort is normal. No respiratory distress.     Breath sounds: Normal breath sounds. No wheezing.  Musculoskeletal:     Cervical back: Neck supple.     Right lower leg: No edema.     Left lower leg:  No edema.  Lymphadenopathy:     Cervical: No cervical adenopathy.  Skin:    General: Skin is warm and dry.     Findings: No rash.  Neurological:     Mental Status: She is alert. Mental status is at baseline.  Psychiatric:        Mood and Affect: Mood normal.        Behavior: Behavior normal.        Lab Results  Component Value Date   WBC 10.5 01/23/2022   HGB 14.1 01/23/2022   HCT 41.5 01/23/2022   PLT 253.0 01/23/2022   GLUCOSE 93 01/23/2022   CHOL 191 01/23/2022   TRIG (H) 01/23/2022    416.0 Triglyceride is over 400; calculations on Lipids are invalid.   HDL 57.80 01/23/2022   LDLDIRECT 87.0 01/23/2022   LDLCALC 109 (H) 11/17/2020   ALT 11 01/23/2022   AST 11 01/23/2022   NA 139 01/23/2022   K 4.4 01/23/2022   CL 103 01/23/2022   CREATININE 0.74 01/23/2022   BUN 21 01/23/2022   CO2 33 (H) 01/23/2022    TSH 1.71 11/17/2020   INR 1.4 01/25/2010   HGBA1C 5.9 01/23/2022     Assessment & Plan:    See Problem List for Assessment and Plan of chronic medical problems.

## 2022-07-25 ENCOUNTER — Encounter: Payer: Self-pay | Admitting: Internal Medicine

## 2022-07-25 ENCOUNTER — Ambulatory Visit (INDEPENDENT_AMBULATORY_CARE_PROVIDER_SITE_OTHER): Payer: Medicare Other | Admitting: Internal Medicine

## 2022-07-25 ENCOUNTER — Ambulatory Visit (INDEPENDENT_AMBULATORY_CARE_PROVIDER_SITE_OTHER): Payer: Medicare Other | Admitting: Physical Therapy

## 2022-07-25 ENCOUNTER — Encounter: Payer: Self-pay | Admitting: Physical Therapy

## 2022-07-25 VITALS — BP 120/76 | HR 90 | Temp 97.9°F | Ht 64.0 in | Wt 161.0 lb

## 2022-07-25 DIAGNOSIS — M150 Primary generalized (osteo)arthritis: Secondary | ICD-10-CM | POA: Diagnosis not present

## 2022-07-25 DIAGNOSIS — R7303 Prediabetes: Secondary | ICD-10-CM | POA: Diagnosis not present

## 2022-07-25 DIAGNOSIS — I10 Essential (primary) hypertension: Secondary | ICD-10-CM

## 2022-07-25 DIAGNOSIS — M6281 Muscle weakness (generalized): Secondary | ICD-10-CM | POA: Diagnosis not present

## 2022-07-25 DIAGNOSIS — E782 Mixed hyperlipidemia: Secondary | ICD-10-CM

## 2022-07-25 DIAGNOSIS — I4821 Permanent atrial fibrillation: Secondary | ICD-10-CM | POA: Diagnosis not present

## 2022-07-25 DIAGNOSIS — M25551 Pain in right hip: Secondary | ICD-10-CM

## 2022-07-25 LAB — LIPID PANEL
Cholesterol: 197 mg/dL (ref 0–200)
HDL: 66.2 mg/dL (ref >39.00)
LDL Cholesterol: 98 mg/dL (ref 0–99)
NonHDL: 130.83
Total CHOL/HDL Ratio: 3
Triglycerides: 163 mg/dL — ABNORMAL HIGH (ref 0.0–149.0)
VLDL: 32.6 mg/dL (ref 0.0–40.0)

## 2022-07-25 LAB — CBC WITH DIFFERENTIAL/PLATELET
Basophils Absolute: 0.1 10*3/uL (ref 0.0–0.1)
Basophils Relative: 1 % (ref 0.0–3.0)
Eosinophils Absolute: 0.1 10*3/uL (ref 0.0–0.7)
Eosinophils Relative: 1.2 % (ref 0.0–5.0)
HCT: 42.3 % (ref 36.0–46.0)
Hemoglobin: 14.7 g/dL (ref 12.0–15.0)
Lymphocytes Relative: 37.2 % (ref 12.0–46.0)
Lymphs Abs: 2.7 10*3/uL (ref 0.7–4.0)
MCHC: 34.8 g/dL (ref 30.0–36.0)
MCV: 93.2 fl (ref 78.0–100.0)
Monocytes Absolute: 0.6 10*3/uL (ref 0.1–1.0)
Monocytes Relative: 9.1 % (ref 3.0–12.0)
Neutro Abs: 3.7 10*3/uL (ref 1.4–7.7)
Neutrophils Relative %: 51.5 % (ref 43.0–77.0)
Platelets: 253 10*3/uL (ref 150.0–400.0)
RBC: 4.54 Mil/uL (ref 3.87–5.11)
RDW: 13.9 % (ref 11.5–15.5)
WBC: 7.1 10*3/uL (ref 4.0–10.5)

## 2022-07-25 LAB — COMPREHENSIVE METABOLIC PANEL
ALT: 12 U/L (ref 0–35)
AST: 17 U/L (ref 0–37)
Albumin: 4.5 g/dL (ref 3.5–5.2)
Alkaline Phosphatase: 79 U/L (ref 39–117)
BUN: 20 mg/dL (ref 6–23)
CO2: 30 mEq/L (ref 19–32)
Calcium: 9.8 mg/dL (ref 8.4–10.5)
Chloride: 101 mEq/L (ref 96–112)
Creatinine, Ser: 0.81 mg/dL (ref 0.40–1.20)
GFR: 71.02 mL/min (ref >60.00)
Glucose, Bld: 87 mg/dL (ref 70–99)
Potassium: 3.6 mEq/L (ref 3.5–5.1)
Sodium: 138 mEq/L (ref 135–145)
Total Bilirubin: 0.6 mg/dL (ref 0.2–1.2)
Total Protein: 7.2 g/dL (ref 6.0–8.3)

## 2022-07-25 LAB — HEMOGLOBIN A1C: Hgb A1c MFr Bld: 5.9 % (ref 4.6–6.5)

## 2022-07-25 NOTE — Assessment & Plan Note (Signed)
Chronic Regular exercise and healthy diet encouraged Check lipid panel  Continue pravastatin 40 mg daily 

## 2022-07-25 NOTE — Assessment & Plan Note (Signed)
Chronic Following with orthopedics for different joints Continue tramadol 50 mg twice daily as needed

## 2022-07-25 NOTE — Assessment & Plan Note (Signed)
Chronic Blood pressure well controlled CMP Continue diltiazem 180 mg twice daily, HCTZ 25 mg daily, lisinopril 40 mg daily

## 2022-07-25 NOTE — Assessment & Plan Note (Signed)
Chronic Check a1c Low sugar / carb diet Stressed regular exercise  

## 2022-07-25 NOTE — Therapy (Signed)
OUTPATIENT PHYSICAL THERAPY LOWER EXTREMITY EVALUATION   Patient Name: [name] MRN: [id number] DOB:[birth year], 76 y.o., female Today's Date: 07/25/2022  END OF SESSION:  PT End of Session - 07/25/22 1352     Visit Number 2    Number of Visits 12    Date for PT Re-Evaluation 08/31/22    Authorization Type Medicare    PT Start Time 1302    PT Stop Time 1343    PT Time Calculation (min) 41 min    Activity Tolerance Patient tolerated treatment well    Behavior During Therapy WFL for tasks assessed/performed              Past Medical History:  Diagnosis Date   ARTHRITIS, HIPS, BILATERAL    Atrial fibrillation ([place])    chronic anticoag - Pradaxa   BURSITIS, SUBTROCHANTERIC    HYPERTENSION    MITRAL VALVE PROLAPSE    Mixed hyperlipidemia    Past Surgical History:  Procedure Laterality Date   DILATION AND CURETTAGE OF UTERUS     TONSILLECTOMY     Patient Active Problem List   Diagnosis Date Noted   Vitamin D deficiency 01/22/2022   Secondary hypercoagulable state ([place]) 11/17/2020   Nasal lesion 11/08/2020   B12 deficiency 10/28/2019   IBS (irritable bowel syndrome) 10/28/2019   Arthritis of right hand 07/22/2019   Osteoarthritis of hand 06/05/2019   Presbycusis of both ears 05/14/2018   ETD (Eustachian tube dysfunction), bilateral 04/12/2018   Prediabetes 05/15/2017   Pain of right heel 01/09/2017   Primary osteoarthritis of both feet 07/10/2016   Primary osteoarthritis of both hands 07/10/2016   Alopecia totalis 06/05/2016   H/O dysplastic nevus 08/03/2015   Obese 10/14/2014   Degenerative lumbar disc 12/04/2013   Hematuria, undiagnosed cause 09/02/2013   Osteopenia 06/11/2013   Primary generalized (osteo)arthritis 12/18/2011   Mixed hyperlipidemia 03/25/2009   Essential hypertension 03/24/2009   ATRIAL FIBRILLATION 03/24/2009   Greater trochanteric bursitis of left hip 02/12/2007    PCP: Billey Gosling  REFERRING PROVIDER: Dr. Teresa Coombs  REFERRING DIAG: R hip pain  THERAPY DIAG:  Pain in right hip  Muscle weakness (generalized)  Rationale for Evaluation and Treatment: Rehabilitation  ONSET DATE:   SUBJECTIVE:   SUBJECTIVE STATEMENT: 07/25/22: Pt with no new complaints.   Eval: Pt states ongoing pain and weakness in hip and legs. She has pain in lateral R hip. States  Difficulty getting up from chair, needs to use hands, feels weak.  Has had previous injections in hip, had a few. Did help.  R leg very weak. Stairs painful. L hip: no pain.  Back: gets tired with walking, can walk 2 miles, states no actual pain.   Stairs: to get into house, 2 steps, low no rails, has had on fridge/wall   PERTINENT HISTORY:   PAIN:  Are you having pain? Yes: NPRS scale: 6/10 Pain location: R hip  Pain description: weak, sore  Aggravating factors: increased activity Relieving factors: none stated    PRECAUTIONS: None  WEIGHT BEARING RESTRICTIONS: No  FALLS:  Has patient fallen in last 6 months? No  PLOF: Independent   PATIENT GOALS: improved strength of hip, get up and down easier, stairs easier, less pain    OBJECTIVE:   DIAGNOSTIC FINDINGS:  MR pelvis.  IMPRESSION: 1. No acute injury of the pelvis. 2. Lower lumbar spine spondylosis.   COGNITION: Overall cognitive status: Within functional limits for tasks assessed     SENSATION:WFL  POSTURE:  No Significant postural limitations  PALPATION: most pain and tenderness in R gr troc, into ITB. Mild soreness in distal rotators, minimal pain in rest of glute,  No pain in anterior hip.    LOWER EXTREMITY ROM:  Lumbar: mild limitation for flex, ext  Hips: WFL Knees WFL   LOWER EXTREMITY MMT:  MMT Right eval Left eval  Hip flexion 3- 3  Hip extension    Hip abduction 3- 3  Hip adduction    Hip internal rotation    Hip external rotation 3- 4-  Knee flexion 4- 5  Knee extension 4 5  Ankle dorsiflexion wfl wfl  Ankle plantarflexion wfl wfl   Ankle inversion    Ankle eversion     (Blank rows = not tested)  LOWER EXTREMITY SPECIAL TESTS:    FUNCTIONAL TESTS:    GAIT: Distance walked: 100 ft Assistive device utilized: None Level of assistance: Complete Independence Comments: mild limp, inc trunk SB due to hip weakness.    TODAY'S TREATMENT:                                                                                                                              DATE:   07/25/22: Therapeutic Exercise: Aerobic: Supine:  Bridging 2 x 10;  SLR 2x10 bil;  Seated:  Sit to stand x 5 hands on knees, x 5 no hands (higher table height); LAQ x 15 bil, 2.5 lb; Hip abd Clams x 20 Blue TB;   S/L : clams x 15.  Standing: March x 20; hip abd 2x10 bil;  Stretches:  Neuromuscular Re-education: Manual Therapy:    PATIENT EDUCATION:  Education details: updated and reviewed HEP Person educated: Patient Education method: Explanation, Demonstration, Tactile cues, Verbal cues, and Handouts Education comprehension: verbalized understanding, returned demonstration, verbal cues required, tactile cues required, and needs further education   HOME EXERCISE PROGRAM: Access Code: [email or web address]   ASSESSMENT:  CLINICAL IMPRESSION:  07/25/22: Pt with good ability for ther ex today. Challenged due to weakness, but no increased pain.  She has most weakness with abduction. Much improved ability for sit to stand today without UE support, on higher mat table. Will benefit from continued/progressive strengthening as tolerated.   Eval: Patient presents with primary complaint of increased pain and weakness in R hip. She has significant weakness in R hip, moderate in L. She has lack of effective HEP for this ongoing weakness. She has increased pain in lateral hip, and decreased ability for transfers, stairs, and standing activity, due to pain and weakness. Pt to benefit from skilled PT to improve pain and improve ability for functional activity.    OBJECTIVE IMPAIRMENTS: Abnormal gait, decreased activity tolerance, decreased endurance, decreased knowledge of use of DME, decreased mobility, difficulty walking, decreased ROM, decreased strength, increased muscle spasms, improper body mechanics, and pain.   ACTIVITY LIMITATIONS: carrying, bending, standing, stairs, transfers, bed mobility, dressing, and locomotion level  PARTICIPATION LIMITATIONS: meal prep, cleaning, laundry,  driving, shopping, and community activity  PERSONAL FACTORS: Time since onset of injury/illness/exacerbation are also affecting patient's functional outcome.   REHAB POTENTIAL: Good  CLINICAL DECISION MAKING: Stable/uncomplicated  EVALUATION COMPLEXITY: Low    GOALS: Goals reviewed with patient? Yes  SHORT TERM GOALS: Target date: 08/03/2022   Pt to be independent with initial HEP  Goal status: INITIAL  2.  Pt to demo optimal mechanics for sit to stand from higher chair.   Goal status: INITIAL    LONG TERM GOALS: Target date: 08/31/2022   Pt to be independent with final HEP  Goal status: INITIAL  2.  Pt to demo improved strength of R hip to at least 4/5 to improve stability and ability for stairs.    Goal status: INITIAL  3.  Pt to demo ability for sit to stand from regular chair height with minimal or no UE support.   Goal status: INITIAL  4.  Pt to report decreased pain in R hip to 0-2/10 with standing and walking activity.   Goal status: INITIAL  5.  Pt to demo ability for stair climbing, reciprocal pattern, with 1 UE support, to improve ability for home and community navigation.   Goal status: INITIAL    PLAN:  PT FREQUENCY: 1-2x/week  PT DURATION: 6 weeks  PLANNED INTERVENTIONS: Therapeutic exercises, Therapeutic activity, Neuromuscular re-education, Balance training, Gait training, Patient/Family education, Self Care, Joint mobilization, Joint manipulation, Stair training, DME instructions, Dry Needling, Electrical  stimulation, Spinal manipulation, Spinal mobilization, Cryotherapy, Moist heat, Taping, Vasopneumatic device, Traction, Ultrasound, Ionotophoresis '4mg'$ /ml Dexamethasone, and Manual therapy  PLAN FOR NEXT SESSION:   Lyndee Hensen, PT, DPT 1:54 PM  07/25/22

## 2022-07-25 NOTE — Assessment & Plan Note (Signed)
Chronic Following with cardiology On Xarelto 20 mg daily, diltiazem 180 mg twice daily CBC, CMP

## 2022-07-27 ENCOUNTER — Ambulatory Visit (INDEPENDENT_AMBULATORY_CARE_PROVIDER_SITE_OTHER): Payer: Medicare Other | Admitting: Physical Therapy

## 2022-07-27 DIAGNOSIS — M25551 Pain in right hip: Secondary | ICD-10-CM

## 2022-07-27 DIAGNOSIS — M6281 Muscle weakness (generalized): Secondary | ICD-10-CM

## 2022-07-27 NOTE — Therapy (Signed)
OUTPATIENT PHYSICAL THERAPY LOWER EXTREMITY TREATMENT   Patient Name: [name] MRN: [id number] DOB:[birth year], 76 y.o., female Today's Date: 07/27/2022  END OF SESSION:  PT End of Session - 07/28/22 1217     Visit Number 3    Number of Visits 12    Date for PT Re-Evaluation 08/31/22    Authorization Type Medicare    PT Start Time 1104    PT Stop Time 1145    PT Time Calculation (min) 41 min    Activity Tolerance Patient tolerated treatment well    Behavior During Therapy WFL for tasks assessed/performed               Past Medical History:  Diagnosis Date   ARTHRITIS, HIPS, BILATERAL    Atrial fibrillation ([place])    chronic anticoag - Pradaxa   BURSITIS, SUBTROCHANTERIC    HYPERTENSION    MITRAL VALVE PROLAPSE    Mixed hyperlipidemia    Past Surgical History:  Procedure Laterality Date   DILATION AND CURETTAGE OF UTERUS     TONSILLECTOMY     Patient Active Problem List   Diagnosis Date Noted   Vitamin D deficiency 01/22/2022   Secondary hypercoagulable state ([place]) 11/17/2020   Nasal lesion 11/08/2020   B12 deficiency 10/28/2019   IBS (irritable bowel syndrome) 10/28/2019   Arthritis of right hand 07/22/2019   Osteoarthritis of hand 06/05/2019   Presbycusis of both ears 05/14/2018   ETD (Eustachian tube dysfunction), bilateral 04/12/2018   Prediabetes 05/15/2017   Pain of right heel 01/09/2017   Primary osteoarthritis of both feet 07/10/2016   Primary osteoarthritis of both hands 07/10/2016   Alopecia totalis 06/05/2016   H/O dysplastic nevus 08/03/2015   Obese 10/14/2014   Degenerative lumbar disc 12/04/2013   Hematuria, undiagnosed cause 09/02/2013   Osteopenia 06/11/2013   Primary generalized (osteo)arthritis 12/18/2011   Mixed hyperlipidemia 03/25/2009   Essential hypertension 03/24/2009   ATRIAL FIBRILLATION 03/24/2009   Greater trochanteric bursitis of left hip 02/12/2007    PCP: Billey Gosling  REFERRING PROVIDER: Dr. Teresa Coombs  REFERRING DIAG: R hip pain  THERAPY DIAG:  Pain in right hip  Muscle weakness (generalized)  Rationale for Evaluation and Treatment: Rehabilitation  ONSET DATE:   SUBJECTIVE:   SUBJECTIVE STATEMENT: 07/27/22: Pt with no new complaints.   Eval: Pt states ongoing pain and weakness in hip and legs. She has pain in lateral R hip. States  Difficulty getting up from chair, needs to use hands, feels weak.  Has had previous injections in hip, had a few. Did help.  R leg very weak. Stairs painful. L hip: no pain.  Back: gets tired with walking, can walk 2 miles, states no actual pain.   Stairs: to get into house, 2 steps, low no rails, has had on fridge/wall   PERTINENT HISTORY:   PAIN:  Are you having pain? Yes: NPRS scale: 6/10 Pain location: R hip  Pain description: weak, sore  Aggravating factors: increased activity Relieving factors: none stated    PRECAUTIONS: None  WEIGHT BEARING RESTRICTIONS: No  FALLS:  Has patient fallen in last 6 months? No  PLOF: Independent   PATIENT GOALS: improved strength of hip, get up and down easier, stairs easier, less pain    OBJECTIVE:   DIAGNOSTIC FINDINGS:  MR pelvis.  IMPRESSION: 1. No acute injury of the pelvis. 2. Lower lumbar spine spondylosis.   COGNITION: Overall cognitive status: Within functional limits for tasks assessed     SENSATION:WFL  POSTURE: No Significant postural limitations  PALPATION: most pain and tenderness in R gr troc, into ITB. Mild soreness in distal rotators, minimal pain in rest of glute,  No pain in anterior hip.    LOWER EXTREMITY ROM:  Lumbar: mild limitation for flex, ext  Hips: WFL Knees WFL   LOWER EXTREMITY MMT:  MMT Right eval Left eval  Hip flexion 3- 3  Hip extension    Hip abduction 3- 3  Hip adduction    Hip internal rotation    Hip external rotation 3- 4-  Knee flexion 4- 5  Knee extension 4 5  Ankle dorsiflexion wfl wfl  Ankle plantarflexion wfl wfl   Ankle inversion    Ankle eversion     (Blank rows = not tested)  LOWER EXTREMITY SPECIAL TESTS:    FUNCTIONAL TESTS:    GAIT: Distance walked: 100 ft Assistive device utilized: None Level of assistance: Complete Independence Comments: mild limp, inc trunk SB due to hip weakness.    TODAY'S TREATMENT:                                                                                                                              DATE:   07/27/22: Therapeutic Exercise: Aerobic: Recumbent bike L1 x 8 min;   Supine:  Seated:  Sit to stand x 5 hands on knees, x 5 no hands (higher table height);  LAQ x 20 bil, 3 lb;  Hip abd Clams x 20 Blue TB;   S/L : clams x 15 on R.   Hip abd 2 x 5 ,  Standing: March x 20; hip abd and ext 2x10 ea bil; Step ups 4 in x 10 on R , 1 UE support; - (continue step up practice next visit, try 6 in step ) Stretches:  Neuromuscular Re-education: Manual Therapy:   07/25/22: Therapeutic Exercise: Aerobic: Supine:  Bridging 2 x 10;  SLR 2x10 bil;  Seated:  Sit to stand x 5 hands on knees, x 5 no hands (higher table height); LAQ x 15 bil, 2.5 lb; Hip abd Clams x 20 Blue TB;   S/L : clams x 15.  Standing: March x 20; hip abd 2x10 bil;  Stretches:  Neuromuscular Re-education: Manual Therapy:    PATIENT EDUCATION:  Education details: updated and reviewed HEP Person educated: Patient Education method: Explanation, Demonstration, Tactile cues, Verbal cues, and Handouts Education comprehension: verbalized understanding, returned demonstration, verbal cues required, tactile cues required, and needs further education   HOME EXERCISE PROGRAM: Access Code: [email or web address]   ASSESSMENT:  CLINICAL IMPRESSION:  07/27/22: Pt with good tolerance for activities today. She does fatigue with ther ex, due to weakness in LE. She has most weakness with hip abd. Previously unable to do in S/L , but was able to do set of 5 today. Pt to benefit from continued  strengthening for hip and LE as tolerated.    Eval: Patient presents with primary complaint of increased  pain and weakness in R hip. She has significant weakness in R hip, moderate in L. She has lack of effective HEP for this ongoing weakness. She has increased pain in lateral hip, and decreased ability for transfers, stairs, and standing activity, due to pain and weakness. Pt to benefit from skilled PT to improve pain and improve ability for functional activity.   OBJECTIVE IMPAIRMENTS: Abnormal gait, decreased activity tolerance, decreased endurance, decreased knowledge of use of DME, decreased mobility, difficulty walking, decreased ROM, decreased strength, increased muscle spasms, improper body mechanics, and pain.   ACTIVITY LIMITATIONS: carrying, bending, standing, stairs, transfers, bed mobility, dressing, and locomotion level  PARTICIPATION LIMITATIONS: meal prep, cleaning, laundry, driving, shopping, and community activity  PERSONAL FACTORS: Time since onset of injury/illness/exacerbation are also affecting patient's functional outcome.   REHAB POTENTIAL: Good  CLINICAL DECISION MAKING: Stable/uncomplicated  EVALUATION COMPLEXITY: Low    GOALS: Goals reviewed with patient? Yes  SHORT TERM GOALS: Target date: 08/03/2022   Pt to be independent with initial HEP  Goal status: INITIAL  2.  Pt to demo optimal mechanics for sit to stand from higher chair.   Goal status: INITIAL    LONG TERM GOALS: Target date: 08/31/2022   Pt to be independent with final HEP  Goal status: INITIAL  2.  Pt to demo improved strength of R hip to at least 4/5 to improve stability and ability for stairs.    Goal status: INITIAL  3.  Pt to demo ability for sit to stand from regular chair height with minimal or no UE support.   Goal status: INITIAL  4.  Pt to report decreased pain in R hip to 0-2/10 with standing and walking activity.   Goal status: INITIAL  5.  Pt to demo ability for  stair climbing, reciprocal pattern, with 1 UE support, to improve ability for home and community navigation.   Goal status: INITIAL    PLAN:  PT FREQUENCY: 1-2x/week  PT DURATION: 6 weeks  PLANNED INTERVENTIONS: Therapeutic exercises, Therapeutic activity, Neuromuscular re-education, Balance training, Gait training, Patient/Family education, Self Care, Joint mobilization, Joint manipulation, Stair training, DME instructions, Dry Needling, Electrical stimulation, Spinal manipulation, Spinal mobilization, Cryotherapy, Moist heat, Taping, Vasopneumatic device, Traction, Ultrasound, Ionotophoresis '4mg'$ /ml Dexamethasone, and Manual therapy  PLAN FOR NEXT SESSION:   Lyndee Hensen, PT, DPT 12:18 PM  07/28/22

## 2022-07-28 ENCOUNTER — Encounter: Payer: Self-pay | Admitting: Physical Therapy

## 2022-08-02 ENCOUNTER — Ambulatory Visit (INDEPENDENT_AMBULATORY_CARE_PROVIDER_SITE_OTHER): Payer: Medicare Other | Admitting: Physical Therapy

## 2022-08-02 ENCOUNTER — Encounter: Payer: Self-pay | Admitting: Physical Therapy

## 2022-08-02 DIAGNOSIS — M25551 Pain in right hip: Secondary | ICD-10-CM | POA: Diagnosis not present

## 2022-08-02 DIAGNOSIS — M6281 Muscle weakness (generalized): Secondary | ICD-10-CM

## 2022-08-02 NOTE — Therapy (Signed)
OUTPATIENT PHYSICAL THERAPY LOWER EXTREMITY TREATMENT   Patient Name: [name] MRN: [id number] DOB:[birth year], 76 y.o., female Today's Date: 07/27/2022  END OF SESSION:  PT End of Session - 08/02/22 0934     Visit Number 4    Number of Visits 12    Date for PT Re-Evaluation 08/31/22    Authorization Type Medicare    PT Start Time 0935    PT Stop Time 1018    PT Time Calculation (min) 43 min    Activity Tolerance Patient tolerated treatment well    Behavior During Therapy WFL for tasks assessed/performed               Past Medical History:  Diagnosis Date   ARTHRITIS, HIPS, BILATERAL    Atrial fibrillation ([place])    chronic anticoag - Pradaxa   BURSITIS, SUBTROCHANTERIC    HYPERTENSION    MITRAL VALVE PROLAPSE    Mixed hyperlipidemia    Past Surgical History:  Procedure Laterality Date   DILATION AND CURETTAGE OF UTERUS     TONSILLECTOMY     Patient Active Problem List   Diagnosis Date Noted   Vitamin D deficiency 01/22/2022   Secondary hypercoagulable state ([place]) 11/17/2020   Nasal lesion 11/08/2020   B12 deficiency 10/28/2019   IBS (irritable bowel syndrome) 10/28/2019   Arthritis of right hand 07/22/2019   Osteoarthritis of hand 06/05/2019   Presbycusis of both ears 05/14/2018   ETD (Eustachian tube dysfunction), bilateral 04/12/2018   Prediabetes 05/15/2017   Pain of right heel 01/09/2017   Primary osteoarthritis of both feet 07/10/2016   Primary osteoarthritis of both hands 07/10/2016   Alopecia totalis 06/05/2016   H/O dysplastic nevus 08/03/2015   Obese 10/14/2014   Degenerative lumbar disc 12/04/2013   Hematuria, undiagnosed cause 09/02/2013   Osteopenia 06/11/2013   Primary generalized (osteo)arthritis 12/18/2011   Mixed hyperlipidemia 03/25/2009   Essential hypertension 03/24/2009   ATRIAL FIBRILLATION 03/24/2009   Greater trochanteric bursitis of left hip 02/12/2007    PCP: Billey Gosling  REFERRING PROVIDER: Dr. Teresa Coombs  REFERRING DIAG: R hip pain  THERAPY DIAG:  Pain in right hip  Muscle weakness (generalized)  Rationale for Evaluation and Treatment: Rehabilitation  ONSET DATE:   SUBJECTIVE:   SUBJECTIVE STATEMENT: 08/02/2022 Pt with no new complaints. States everything is more or the less the same.  Eval: Pt states ongoing pain and weakness in hip and legs. She has pain in lateral R hip. States  Difficulty getting up from chair, needs to use hands, feels weak.  Has had previous injections in hip, had a few. Did help.  R leg very weak. Stairs painful. L hip: no pain.  Back: gets tired with walking, can walk 2 miles, states no actual pain.   Stairs: to get into house, 2 steps, low no rails, has had on fridge/wall   PERTINENT HISTORY:   PAIN:  Are you having pain? Yes: NPRS scale: 6/10 Pain location: R hip  Pain description: weak, sore  Aggravating factors: increased activity Relieving factors: none stated    PRECAUTIONS: None  WEIGHT BEARING RESTRICTIONS: No  FALLS:  Has patient fallen in last 6 months? No  PLOF: Independent   PATIENT GOALS: improved strength of hip, get up and down easier, stairs easier, less pain    OBJECTIVE:   DIAGNOSTIC FINDINGS:  MR pelvis.  IMPRESSION: 1. No acute injury of the pelvis. 2. Lower lumbar spine spondylosis.   COGNITION: Overall cognitive status: Within functional limits for  tasks assessed     SENSATION:WFL  POSTURE: No Significant postural limitations  PALPATION: most pain and tenderness in R gr troc, into ITB. Mild soreness in distal rotators, minimal pain in rest of glute,  No pain in anterior hip.    LOWER EXTREMITY ROM:  Lumbar: mild limitation for flex, ext  Hips: WFL Knees WFL   LOWER EXTREMITY MMT:  MMT Right eval Left eval  Hip flexion 3- 3  Hip extension    Hip abduction 3- 3  Hip adduction    Hip internal rotation    Hip external rotation 3- 4-  Knee flexion 4- 5  Knee extension 4 5  Ankle  dorsiflexion wfl wfl  Ankle plantarflexion wfl wfl  Ankle inversion    Ankle eversion     (Blank rows = not tested)  LOWER EXTREMITY SPECIAL TESTS:    FUNCTIONAL TESTS:    GAIT: Distance walked: 100 ft Assistive device utilized: None Level of assistance: Complete Independence Comments: mild limp, inc trunk SB due to hip weakness.    TODAY'S TREATMENT:                                                                                                                              DATE:   08/02/2022: Therapeutic Exercise: Aerobic: Recumbent bike L1 x 8 min;   Supine:  Seated:  hip abd stretch x25 5" holds, piriformis stretch IR/ER B 6 minutes total, forward and backwards rocking for hip stretch x20 B    Neuromuscular Re-education: Therapeutic Activity: STS - focus on breathing with motion (not holding breath) and reaching forward- practiced with blue cushion and standard sized chair 20 minutes   07/25/22: Therapeutic Exercise: Aerobic: Supine:  Bridging 2 x 10;  SLR 2x10 bil;  Seated:  Sit to stand x 5 hands on knees, x 5 no hands (higher table height); LAQ x 15 bil, 2.5 lb; Hip abd Clams x 20 Blue TB;   S/L : clams x 15.  Standing: March x 20; hip abd 2x10 bil;  Stretches:  Neuromuscular Re-education: Manual Therapy:    PATIENT EDUCATION:  Education details: updated and reviewed HEP, educated patient in safe STS and how to perform with optimal mechanics, practiced different height chairs and breath holding vs breathing Person educated: Patient Education method: Explanation, Demonstration, Tactile cues, Verbal cues, and Handouts Education comprehension: verbalized understanding, returned demonstration, verbal cues required, tactile cues required, and needs further education   HOME EXERCISE PROGRAM: Access Code: [email or web address]   ASSESSMENT:  CLINICAL IMPRESSION: 08/02/2022 Session focused on hip mobility and education. Also focused on STS mechanics and optimal way to  get up and down from the chair. Tolerated this well. Discussed movement strategies prior to getting up from a chair after sitting for prolonged periods of time. Fatigue in hip noted end of session.   Eval: Patient presents with primary complaint of increased pain and weakness in R hip. She has significant weakness in R hip,  moderate in L. She has lack of effective HEP for this ongoing weakness. She has increased pain in lateral hip, and decreased ability for transfers, stairs, and standing activity, due to pain and weakness. Pt to benefit from skilled PT to improve pain and improve ability for functional activity.   OBJECTIVE IMPAIRMENTS: Abnormal gait, decreased activity tolerance, decreased endurance, decreased knowledge of use of DME, decreased mobility, difficulty walking, decreased ROM, decreased strength, increased muscle spasms, improper body mechanics, and pain.   ACTIVITY LIMITATIONS: carrying, bending, standing, stairs, transfers, bed mobility, dressing, and locomotion level  PARTICIPATION LIMITATIONS: meal prep, cleaning, laundry, driving, shopping, and community activity  PERSONAL FACTORS: Time since onset of injury/illness/exacerbation are also affecting patient's functional outcome.   REHAB POTENTIAL: Good  CLINICAL DECISION MAKING: Stable/uncomplicated  EVALUATION COMPLEXITY: Low    GOALS: Goals reviewed with patient? Yes  SHORT TERM GOALS: Target date: 08/03/2022   Pt to be independent with initial HEP  Goal status: INITIAL  2.  Pt to demo optimal mechanics for sit to stand from higher chair.   Goal status: INITIAL    LONG TERM GOALS: Target date: 08/31/2022   Pt to be independent with final HEP  Goal status: INITIAL  2.  Pt to demo improved strength of R hip to at least 4/5 to improve stability and ability for stairs.    Goal status: INITIAL  3.  Pt to demo ability for sit to stand from regular chair height with minimal or no UE support.   Goal status:  INITIAL  4.  Pt to report decreased pain in R hip to 0-2/10 with standing and walking activity.   Goal status: INITIAL  5.  Pt to demo ability for stair climbing, reciprocal pattern, with 1 UE support, to improve ability for home and community navigation.   Goal status: INITIAL    PLAN:  PT FREQUENCY: 1-2x/week  PT DURATION: 6 weeks  PLANNED INTERVENTIONS: Therapeutic exercises, Therapeutic activity, Neuromuscular re-education, Balance training, Gait training, Patient/Family education, Self Care, Joint mobilization, Joint manipulation, Stair training, DME instructions, Dry Needling, Electrical stimulation, Spinal manipulation, Spinal mobilization, Cryotherapy, Moist heat, Taping, Vasopneumatic device, Traction, Ultrasound, Ionotophoresis '4mg'$ /ml Dexamethasone, and Manual therapy  PLAN FOR NEXT SESSION:   10:54 AM, 08/02/22 Jerene Pitch, DPT Physical Therapy with [facility]

## 2022-08-04 ENCOUNTER — Ambulatory Visit (INDEPENDENT_AMBULATORY_CARE_PROVIDER_SITE_OTHER): Payer: Medicare Other | Admitting: Physical Therapy

## 2022-08-04 ENCOUNTER — Encounter: Payer: Self-pay | Admitting: Physical Therapy

## 2022-08-04 DIAGNOSIS — M25551 Pain in right hip: Secondary | ICD-10-CM

## 2022-08-04 DIAGNOSIS — M6281 Muscle weakness (generalized): Secondary | ICD-10-CM

## 2022-08-04 NOTE — Therapy (Signed)
OUTPATIENT PHYSICAL THERAPY LOWER EXTREMITY TREATMENT   Patient Name: [name] MRN: [id number] DOB:1945-08-26, 76 y.o., female Today's Date: 08/04/2022  END OF SESSION:  PT End of Session - 08/04/22 0856     Visit Number 5    Number of Visits 12    Date for PT Re-Evaluation 08/31/22    Authorization Type Medicare    PT Start Time 0850    PT Stop Time 0930    PT Time Calculation (min) 40 min    Activity Tolerance Patient tolerated treatment well    Behavior During Therapy WFL for tasks assessed/performed                Past Medical History:  Diagnosis Date   ARTHRITIS, HIPS, BILATERAL    Atrial fibrillation ([place])    chronic anticoag - Pradaxa   BURSITIS, SUBTROCHANTERIC    HYPERTENSION    MITRAL VALVE PROLAPSE    Mixed hyperlipidemia    Past Surgical History:  Procedure Laterality Date   DILATION AND CURETTAGE OF UTERUS     TONSILLECTOMY     Patient Active Problem List   Diagnosis Date Noted   Vitamin D deficiency 01/22/2022   Secondary hypercoagulable state ([place]) 11/17/2020   Nasal lesion 11/08/2020   B12 deficiency 10/28/2019   IBS (irritable bowel syndrome) 10/28/2019   Arthritis of right hand 07/22/2019   Osteoarthritis of hand 06/05/2019   Presbycusis of both ears 05/14/2018   ETD (Eustachian tube dysfunction), bilateral 04/12/2018   Prediabetes 05/15/2017   Pain of right heel 01/09/2017   Primary osteoarthritis of both feet 07/10/2016   Primary osteoarthritis of both hands 07/10/2016   Alopecia totalis 06/05/2016   H/O dysplastic nevus 08/03/2015   Obese 10/14/2014   Degenerative lumbar disc 12/04/2013   Hematuria, undiagnosed cause 09/02/2013   Osteopenia 06/11/2013   Primary generalized (osteo)arthritis 12/18/2011   Mixed hyperlipidemia 03/25/2009   Essential hypertension 03/24/2009   ATRIAL FIBRILLATION 03/24/2009   Greater trochanteric bursitis of left hip 02/12/2007    PCP: Billey Gosling  REFERRING PROVIDER: Dr. Teresa Coombs  REFERRING DIAG: R hip pain  THERAPY DIAG:  Pain in right hip  Muscle weakness (generalized)  Rationale for Evaluation and Treatment: Rehabilitation  ONSET DATE:   SUBJECTIVE:   SUBJECTIVE STATEMENT:  08/04/2022 Pt still having some soreness in hip with sleeping if she is laying on it. With activity: legs still feeling weak and sore. Thighs sore after a lot of activity and at end of the day.   Eval: Pt states ongoing pain and weakness in hip and legs. She has pain in lateral R hip. States  Difficulty getting up from chair, needs to use hands, feels weak.  Has had previous injections in hip, had a few. Did help.  R leg very weak. Stairs painful. L hip: no pain.  Back: gets tired with walking, can walk 2 miles, states no actual pain.   Stairs: to get into house, 2 steps, low no rails, has had on fridge/wall   PERTINENT HISTORY:   PAIN:  Are you having pain? Yes: NPRS scale: 6/10 Pain location: R hip  Pain description: weak, sore  Aggravating factors: increased activity Relieving factors: none stated    PRECAUTIONS: None  WEIGHT BEARING RESTRICTIONS: No  FALLS:  Has patient fallen in last 6 months? No  PLOF: Independent   PATIENT GOALS: improved strength of hip, get up and down easier, stairs easier, less pain    OBJECTIVE:   DIAGNOSTIC FINDINGS:  MR  pelvis.  IMPRESSION: 1. No acute injury of the pelvis. 2. Lower lumbar spine spondylosis.   COGNITION: Overall cognitive status: Within functional limits for tasks assessed     SENSATION:WFL  POSTURE: No Significant postural limitations  PALPATION: most pain and tenderness in R gr troc, into ITB. Mild soreness in distal rotators, minimal pain in rest of glute,  No pain in anterior hip.    LOWER EXTREMITY ROM:  Lumbar: mild limitation for flex, ext  Hips: WFL Knees WFL   LOWER EXTREMITY MMT:  MMT Right eval Left eval  Hip flexion 3- 3  Hip extension    Hip abduction 3- 3  Hip  adduction    Hip internal rotation    Hip external rotation 3- 4-  Knee flexion 4- 5  Knee extension 4 5  Ankle dorsiflexion wfl wfl  Ankle plantarflexion wfl wfl  Ankle inversion    Ankle eversion     (Blank rows = not tested)  LOWER EXTREMITY SPECIAL TESTS:    FUNCTIONAL TESTS:    GAIT: Distance walked: 100 ft Assistive device utilized: None Level of assistance: Complete Independence Comments: mild limp, inc trunk SB due to hip weakness.    TODAY'S TREATMENT:                                                                                                                              DATE:   08/04/22: Therapeutic Exercise: Aerobic: Recumbent bike L2 x 5 min, L1 x 3 min   Supine:  Seated:  Sit to stand 3x5 no hands (higher table height);  LAQ x 20 bil, 3 lb;    Standing: step ups 4 in x 10 on R , 1 UE support; 6 in x 10 on L;  Stairs up/down 5 steps x 3 with education on safety and mechanics when descending.  Stretches:  Seated piriformis stretch 45  sec x 3 bil;  Neuromuscular Re-education: Manual Therapy:     08/02/2022: Therapeutic Exercise: Aerobic: Recumbent bike L1 x 8 min;   Supine:  Seated:  hip abd stretch x25 5" holds, piriformis stretch IR/ER B 6 minutes total, forward and backwards rocking for hip stretch x20 B    Neuromuscular Re-education: Therapeutic Activity: STS - focus on breathing with motion (not holding breath) and reaching forward- practiced with blue cushion and standard sized chair 20 minutes   07/25/22: Therapeutic Exercise: Aerobic: Supine:  Bridging 2 x 10;  SLR 2x10 bil;  Seated:  Sit to stand x 5 hands on knees, x 5 no hands (higher table height); LAQ x 15 bil, 2.5 lb; Hip abd Clams x 20 Blue TB;   S/L : clams x 15.  Standing: March x 20; hip abd 2x10 bil;  Stretches:  Neuromuscular Re-education: Manual Therapy:    PATIENT EDUCATION:  Education details: updated and reviewed HEP, discussed options for railing at home on front  steps.  Person educated: Patient Education method: Explanation, Demonstration, Tactile cues,  Verbal cues, and Handouts Education comprehension: verbalized understanding, returned demonstration, verbal cues required, tactile cues required, and needs further education   HOME EXERCISE PROGRAM: Access Code: [email or web address]   ASSESSMENT:  CLINICAL IMPRESSION: 08/04/2022 Continued work on strength for LEs, quads and hips. Pt with improving ability for sit to stand, but still unable to do from regular chair height with no hands, due to weakness.Reviewed need for continued strength.   Eval: Patient presents with primary complaint of increased pain and weakness in R hip. She has significant weakness in R hip, moderate in L. She has lack of effective HEP for this ongoing weakness. She has increased pain in lateral hip, and decreased ability for transfers, stairs, and standing activity, due to pain and weakness. Pt to benefit from skilled PT to improve pain and improve ability for functional activity.   OBJECTIVE IMPAIRMENTS: Abnormal gait, decreased activity tolerance, decreased endurance, decreased knowledge of use of DME, decreased mobility, difficulty walking, decreased ROM, decreased strength, increased muscle spasms, improper body mechanics, and pain.   ACTIVITY LIMITATIONS: carrying, bending, standing, stairs, transfers, bed mobility, dressing, and locomotion level  PARTICIPATION LIMITATIONS: meal prep, cleaning, laundry, driving, shopping, and community activity  PERSONAL FACTORS: Time since onset of injury/illness/exacerbation are also affecting patient's functional outcome.   REHAB POTENTIAL: Good  CLINICAL DECISION MAKING: Stable/uncomplicated  EVALUATION COMPLEXITY: Low    GOALS: Goals reviewed with patient? Yes  SHORT TERM GOALS: Target date: 08/03/2022   Pt to be independent with initial HEP  Goal status: INITIAL  2.  Pt to demo optimal mechanics for sit to stand from higher  chair.   Goal status: INITIAL    LONG TERM GOALS: Target date: 08/31/2022   Pt to be independent with final HEP  Goal status: INITIAL  2.  Pt to demo improved strength of R hip to at least 4/5 to improve stability and ability for stairs.    Goal status: INITIAL  3.  Pt to demo ability for sit to stand from regular chair height with minimal or no UE support.   Goal status: INITIAL  4.  Pt to report decreased pain in R hip to 0-2/10 with standing and walking activity.   Goal status: INITIAL  5.  Pt to demo ability for stair climbing, reciprocal pattern, with 1 UE support, to improve ability for home and community navigation.   Goal status: INITIAL    PLAN:  PT FREQUENCY: 1-2x/week  PT DURATION: 6 weeks  PLANNED INTERVENTIONS: Therapeutic exercises, Therapeutic activity, Neuromuscular re-education, Balance training, Gait training, Patient/Family education, Self Care, Joint mobilization, Joint manipulation, Stair training, DME instructions, Dry Needling, Electrical stimulation, Spinal manipulation, Spinal mobilization, Cryotherapy, Moist heat, Taping, Vasopneumatic device, Traction, Ultrasound, Ionotophoresis '4mg'$ /ml Dexamethasone, and Manual therapy  PLAN FOR NEXT SESSION:   Lyndee Hensen, PT, DPT 10:11 AM  08/04/22

## 2022-08-06 ENCOUNTER — Other Ambulatory Visit: Payer: Self-pay | Admitting: Internal Medicine

## 2022-08-08 ENCOUNTER — Ambulatory Visit (INDEPENDENT_AMBULATORY_CARE_PROVIDER_SITE_OTHER): Payer: Medicare Other | Admitting: Physical Therapy

## 2022-08-08 ENCOUNTER — Encounter: Payer: Self-pay | Admitting: Physical Therapy

## 2022-08-08 DIAGNOSIS — N958 Other specified menopausal and perimenopausal disorders: Secondary | ICD-10-CM | POA: Diagnosis not present

## 2022-08-08 DIAGNOSIS — Z8262 Family history of osteoporosis: Secondary | ICD-10-CM | POA: Diagnosis not present

## 2022-08-08 DIAGNOSIS — R29890 Loss of height: Secondary | ICD-10-CM | POA: Diagnosis not present

## 2022-08-08 DIAGNOSIS — M25551 Pain in right hip: Secondary | ICD-10-CM

## 2022-08-08 DIAGNOSIS — M6281 Muscle weakness (generalized): Secondary | ICD-10-CM | POA: Diagnosis not present

## 2022-08-08 DIAGNOSIS — M8588 Other specified disorders of bone density and structure, other site: Secondary | ICD-10-CM | POA: Diagnosis not present

## 2022-08-08 NOTE — Therapy (Addendum)
OUTPATIENT PHYSICAL THERAPY LOWER EXTREMITY TREATMENT   Patient Name: [name] MRN: [id number] DOB:08-11-1945, 77 y.o., female Today's Date: 08/08/2022   END OF SESSION:  PT End of Session - 08/08/22 0902     Visit Number 6    Number of Visits 12    Date for PT Re-Evaluation 08/31/22    Authorization Type Medicare    PT Start Time 0850    PT Stop Time 0928    PT Time Calculation (min) 38 min    Activity Tolerance Patient tolerated treatment well    Behavior During Therapy WFL for tasks assessed/performed                 Past Medical History:  Diagnosis Date   ARTHRITIS, HIPS, BILATERAL    Atrial fibrillation (HCC)    chronic anticoag - Pradaxa   BURSITIS, SUBTROCHANTERIC    HYPERTENSION    MITRAL VALVE PROLAPSE    Mixed hyperlipidemia    Past Surgical History:  Procedure Laterality Date   DILATION AND CURETTAGE OF UTERUS     TONSILLECTOMY     Patient Active Problem List   Diagnosis Date Noted   Vitamin D deficiency 01/22/2022   Secondary hypercoagulable state (HCC) 11/17/2020   Nasal lesion 11/08/2020   B12 deficiency 10/28/2019   IBS (irritable bowel syndrome) 10/28/2019   Arthritis of right hand 07/22/2019   Osteoarthritis of hand 06/05/2019   Presbycusis of both ears 05/14/2018   ETD (Eustachian tube dysfunction), bilateral 04/12/2018   Prediabetes 05/15/2017   Pain of right heel 01/09/2017   Primary osteoarthritis of both feet 07/10/2016   Primary osteoarthritis of both hands 07/10/2016   Alopecia totalis 06/05/2016   H/O dysplastic nevus 08/03/2015   Obese 10/14/2014   Degenerative lumbar disc 12/04/2013   Hematuria, undiagnosed cause 09/02/2013   Osteopenia 06/11/2013   Primary generalized (osteo)arthritis 12/18/2011   Mixed hyperlipidemia 03/25/2009   Essential hypertension 03/24/2009   ATRIAL FIBRILLATION 03/24/2009   Greater trochanteric bursitis of left hip 02/12/2007    PCP: Cheryll Cockayne  REFERRING PROVIDER: Dr. Gaspar Bidding  REFERRING DIAG: R hip pain  THERAPY DIAG:  Pain in right hip  Muscle weakness (generalized)  Rationale for Evaluation and Treatment: Rehabilitation  ONSET DATE:   SUBJECTIVE:   SUBJECTIVE STATEMENT:  08/08/2022 Pt with no new complaints.   Eval: Pt states ongoing pain and weakness in hip and legs. She has pain in lateral R hip. States  Difficulty getting up from chair, needs to use hands, feels weak.  Has had previous injections in hip, had a few. Did help.  R leg very weak. Stairs painful. L hip: no pain.  Back: gets tired with walking, can walk 2 miles, states no actual pain.   Stairs: to get into house, 2 steps, low no rails, has had on fridge/wall   PERTINENT HISTORY:   PAIN:  Are you having pain? Yes: NPRS scale: 6/10 Pain location: R hip  Pain description: weak, sore  Aggravating factors: increased activity Relieving factors: none stated    PRECAUTIONS: None  WEIGHT BEARING RESTRICTIONS: No  FALLS:  Has patient fallen in last 6 months? No  PLOF: Independent   PATIENT GOALS: improved strength of hip, get up and down easier, stairs easier, less pain    OBJECTIVE:   DIAGNOSTIC FINDINGS:  MR pelvis.  IMPRESSION: 1. No acute injury of the pelvis. 2. Lower lumbar spine spondylosis.   COGNITION: Overall cognitive status: Within functional limits for tasks assessed  SENSATION:WFL  POSTURE: No Significant postural limitations  PALPATION: most pain and tenderness in R gr troc, into ITB. Mild soreness in distal rotators, minimal pain in rest of glute,  No pain in anterior hip.    LOWER EXTREMITY ROM:  Lumbar: mild limitation for flex, ext  Hips: WFL Knees WFL   LOWER EXTREMITY MMT:  MMT Right eval Left eval  Hip flexion 3- 3  Hip extension    Hip abduction 3- 3  Hip adduction    Hip internal rotation    Hip external rotation 3- 4-  Knee flexion 4- 5  Knee extension 4 5  Ankle dorsiflexion wfl wfl  Ankle plantarflexion wfl  wfl  Ankle inversion    Ankle eversion     (Blank rows = not tested)  LOWER EXTREMITY SPECIAL TESTS:    FUNCTIONAL TESTS:    GAIT: Distance walked: 100 ft Assistive device utilized: None Level of assistance: Complete Independence Comments: mild limp, inc trunk SB due to hip weakness.    TODAY'S TREATMENT:                                                                                                                              DATE:    08/08/2022 Therapeutic Exercise: Aerobic: Recumbent bike L2 x 7 min, Supine: bridging 2 x 10;  Seated:  Sit to stand x10 no hands (higher table height);  Standing:  step ups 6 in x 10 bil;   Marching, no UE support x 20;  Stretches:  Seated piriformis stretch 45  sec x 3 bil;  Neuromuscular Re-education: Manual Therapy:    08/04/22: Therapeutic Exercise: Aerobic: Recumbent bike L2 x 5 min, L1 x 3 min   Supine:  Seated:  Sit to stand 3x5 no hands (higher table height);  LAQ x 20 bil, 3 lb;    Standing: step ups 4 in x 10 on R , 1 UE support; 6 in x 10 on L;  Stairs up/down 5 steps x 3 with education on safety and mechanics when descending.  Stretches:  Seated piriformis stretch 45  sec x 3 bil;  Neuromuscular Re-education: Manual Therapy:     08/02/2022: Therapeutic Exercise: Aerobic: Recumbent bike L1 x 8 min;   Supine:  Seated:  hip abd stretch x25 5" holds, piriformis stretch IR/ER B 6 minutes total, forward and backwards rocking for hip stretch x20 B Neuromuscular Re-education: Therapeutic Activity: STS - focus on breathing with motion (not holding breath) and reaching forward- practiced with blue cushion and standard sized chair 20 minutes    PATIENT EDUCATION:  Education details: updated and reviewed HEP,  Person educated: Patient Education method: Explanation, Demonstration, Tactile cues, Verbal cues, and Handouts Education comprehension: verbalized understanding, returned demonstration, verbal cues required, tactile  cues required, and needs further education   HOME EXERCISE PROGRAM: Access Code: [email or web address]   ASSESSMENT:  CLINICAL IMPRESSION: 08/08/2022 Pt with notable improvements for ability for ther ex today. She is  still challenged with strength, but improved ability for reps today, and improved stability of body and hip with standing hip abd and step ups today. Plan to continue strength as tolerated.    Eval: Patient presents with primary complaint of increased pain and weakness in R hip. She has significant weakness in R hip, moderate in L. She has lack of effective HEP for this ongoing weakness. She has increased pain in lateral hip, and decreased ability for transfers, stairs, and standing activity, due to pain and weakness. Pt to benefit from skilled PT to improve pain and improve ability for functional activity.   OBJECTIVE IMPAIRMENTS: Abnormal gait, decreased activity tolerance, decreased endurance, decreased knowledge of use of DME, decreased mobility, difficulty walking, decreased ROM, decreased strength, increased muscle spasms, improper body mechanics, and pain.   ACTIVITY LIMITATIONS: carrying, bending, standing, stairs, transfers, bed mobility, dressing, and locomotion level  PARTICIPATION LIMITATIONS: meal prep, cleaning, laundry, driving, shopping, and community activity  PERSONAL FACTORS: Time since onset of injury/illness/exacerbation are also affecting patient's functional outcome.   REHAB POTENTIAL: Good  CLINICAL DECISION MAKING: Stable/uncomplicated  EVALUATION COMPLEXITY: Low    GOALS: Goals reviewed with patient? Yes  SHORT TERM GOALS: Target date: 08/03/2022   Pt to be independent with initial HEP  Goal status: INITIAL  2.  Pt to demo optimal mechanics for sit to stand from higher chair.   Goal status: INITIAL    LONG TERM GOALS: Target date: 08/31/2022   Pt to be independent with final HEP  Goal status: INITIAL  2.  Pt to demo improved strength of R  hip to at least 4/5 to improve stability and ability for stairs.    Goal status: INITIAL  3.  Pt to demo ability for sit to stand from regular chair height with minimal or no UE support.   Goal status: INITIAL  4.  Pt to report decreased pain in R hip to 0-2/10 with standing and walking activity.   Goal status: INITIAL  5.  Pt to demo ability for stair climbing, reciprocal pattern, with 1 UE support, to improve ability for home and community navigation.   Goal status: INITIAL    PLAN:  PT FREQUENCY: 1-2x/week  PT DURATION: 6 weeks  PLANNED INTERVENTIONS: Therapeutic exercises, Therapeutic activity, Neuromuscular re-education, Balance training, Gait training, Patient/Family education, Self Care, Joint mobilization, Joint manipulation, Stair training, DME instructions, Dry Needling, Electrical stimulation, Spinal manipulation, Spinal mobilization, Cryotherapy, Moist heat, Taping, Vasopneumatic device, Traction, Ultrasound, Ionotophoresis 4mg /ml Dexamethasone, and Manual therapy  PLAN FOR NEXT SESSION:   Sedalia Muta, PT, DPT 9:28 AM  08/08/22   PHYSICAL THERAPY DISCHARGE SUMMARY  Visits from Start of Care: 6  Plan: Patient agrees to discharge.  Patient goals were partially met. Patient is being discharged due to going out of town.    Called and spoke with pt on 08/10/21. She had to cancel last scheduled appt due to having neck pain. We discussed importance of continueing LE strengthening, and final HEP was emailed to her. She may require continued care in the future when she returns from being out of state.  Sedalia Muta, PT, DPT 8:53 AM  08/10/22

## 2022-08-10 ENCOUNTER — Encounter: Payer: Medicare Other | Admitting: Physical Therapy

## 2022-08-11 DIAGNOSIS — R262 Difficulty in walking, not elsewhere classified: Secondary | ICD-10-CM | POA: Diagnosis not present

## 2022-08-11 DIAGNOSIS — M9901 Segmental and somatic dysfunction of cervical region: Secondary | ICD-10-CM | POA: Diagnosis not present

## 2022-08-11 DIAGNOSIS — M9902 Segmental and somatic dysfunction of thoracic region: Secondary | ICD-10-CM | POA: Diagnosis not present

## 2022-08-11 DIAGNOSIS — M542 Cervicalgia: Secondary | ICD-10-CM | POA: Diagnosis not present

## 2022-08-11 DIAGNOSIS — M47812 Spondylosis without myelopathy or radiculopathy, cervical region: Secondary | ICD-10-CM | POA: Diagnosis not present

## 2022-08-11 DIAGNOSIS — M9908 Segmental and somatic dysfunction of rib cage: Secondary | ICD-10-CM | POA: Diagnosis not present

## 2022-08-17 DIAGNOSIS — M8580 Other specified disorders of bone density and structure, unspecified site: Secondary | ICD-10-CM | POA: Diagnosis not present

## 2022-08-20 ENCOUNTER — Other Ambulatory Visit: Payer: Self-pay | Admitting: Internal Medicine

## 2022-08-21 ENCOUNTER — Other Ambulatory Visit: Payer: Self-pay

## 2022-08-21 LAB — HM DEXA SCAN: HM Dexa Scan: -1

## 2022-09-04 ENCOUNTER — Telehealth: Payer: Self-pay | Admitting: Cardiology

## 2022-09-04 NOTE — Telephone Encounter (Signed)
Pt c/o medication issue:  1. Name of Medication:   XARELTO 20 MG TABS tablet    2. How are you currently taking this medication (dosage and times per day)? 1 tablet daily  3. Are you having a reaction (difficulty breathing--STAT)? no  4. What is your medication issue? Patient requesting prescription be sent to a [facility]. Phone: [phone]

## 2022-09-06 MED ORDER — RIVAROXABAN 20 MG PO TABS: 20.0000 mg | ORAL_TABLET | Freq: Every day | ORAL | 1 refills | Status: DC

## 2022-09-06 NOTE — Telephone Encounter (Signed)
Left message for patient to call back  

## 2022-09-07 NOTE — Telephone Encounter (Signed)
Spoke with the patient and advised that I have a written prescription for Xarelto for her signed by Dr. Quentin Ore. She is currently in [place] so cannot come by to pick it up. Advised that I could mail or fax it for her. She is going to contact the pharmacy to see the best way to get it and call me back

## 2022-09-07 NOTE — Telephone Encounter (Signed)
Patient is returning call and is requesting return call.  

## 2022-09-08 NOTE — Telephone Encounter (Signed)
Patient stated the prescription cost $800 and that's too costly. She wanted to thank Percival Spanish, RN for her assistance.

## 2022-09-08 NOTE — Telephone Encounter (Signed)
Pt called back with update on prescription that pt was requesting be sent in for Xarelto. She states that she no longer needs this, but does appreciate the help. She says it does not look like it will work out for her this year, but maybe next year.

## 2022-09-11 ENCOUNTER — Encounter: Payer: Self-pay | Admitting: Internal Medicine

## 2022-09-11 NOTE — Progress Notes (Signed)
Outside notes received. Information abstracted. Notes sent to scan.  

## 2022-09-14 ENCOUNTER — Encounter (HOSPITAL_COMMUNITY): Payer: Self-pay | Admitting: *Deleted

## 2022-10-05 ENCOUNTER — Other Ambulatory Visit: Payer: Self-pay | Admitting: Internal Medicine

## 2022-10-09 ENCOUNTER — Ambulatory Visit
Admission: RE | Admit: 2022-10-09 | Discharge: 2022-10-09 | Disposition: A | Discharge status: Home / Self Care | Payer: Medicare Other | Source: Ambulatory Visit | Attending: Sports Medicine | Admitting: Sports Medicine

## 2022-10-09 ENCOUNTER — Other Ambulatory Visit: Payer: Self-pay | Admitting: Sports Medicine

## 2022-10-09 DIAGNOSIS — M51369 Other intervertebral disc degeneration, lumbar region without mention of lumbar back pain or lower extremity pain: Secondary | ICD-10-CM

## 2022-10-09 DIAGNOSIS — M5416 Radiculopathy, lumbar region: Secondary | ICD-10-CM

## 2022-10-09 DIAGNOSIS — M5136 Other intervertebral disc degeneration, lumbar region: Secondary | ICD-10-CM

## 2022-10-09 DIAGNOSIS — M9904 Segmental and somatic dysfunction of sacral region: Secondary | ICD-10-CM | POA: Diagnosis not present

## 2022-10-09 DIAGNOSIS — M25552 Pain in left hip: Secondary | ICD-10-CM | POA: Diagnosis not present

## 2022-10-09 DIAGNOSIS — M25551 Pain in right hip: Secondary | ICD-10-CM | POA: Diagnosis not present

## 2022-10-09 DIAGNOSIS — M9906 Segmental and somatic dysfunction of lower extremity: Secondary | ICD-10-CM | POA: Diagnosis not present

## 2022-10-09 DIAGNOSIS — M9905 Segmental and somatic dysfunction of pelvic region: Secondary | ICD-10-CM | POA: Diagnosis not present

## 2022-10-16 ENCOUNTER — Other Ambulatory Visit: Payer: Self-pay | Admitting: Internal Medicine

## 2022-10-16 DIAGNOSIS — Z1231 Encounter for screening mammogram for malignant neoplasm of breast: Secondary | ICD-10-CM

## 2022-10-31 DIAGNOSIS — R262 Difficulty in walking, not elsewhere classified: Secondary | ICD-10-CM | POA: Diagnosis not present

## 2022-10-31 DIAGNOSIS — R531 Weakness: Secondary | ICD-10-CM | POA: Diagnosis not present

## 2022-10-31 DIAGNOSIS — M542 Cervicalgia: Secondary | ICD-10-CM | POA: Diagnosis not present

## 2022-10-31 DIAGNOSIS — M47812 Spondylosis without myelopathy or radiculopathy, cervical region: Secondary | ICD-10-CM | POA: Diagnosis not present

## 2022-11-09 IMAGING — MR MR PELVIS W/O CM
5 series · 33 of 48 positions shown · non-contrast
Comparison: None.

CLINICAL DATA: Right-sided hip pain for 4 months

EXAM:
MRI PELVIS WITHOUT CONTRAST
TECHNIQUE: Multiplanar multisequence MR imaging of the pelvis was performed. No
intravenous contrast was administered.

[Series 3: STIR · coronal · 4.0mm · 0.70mm/px · 8 of 31 slices shown]
[im 1/31]
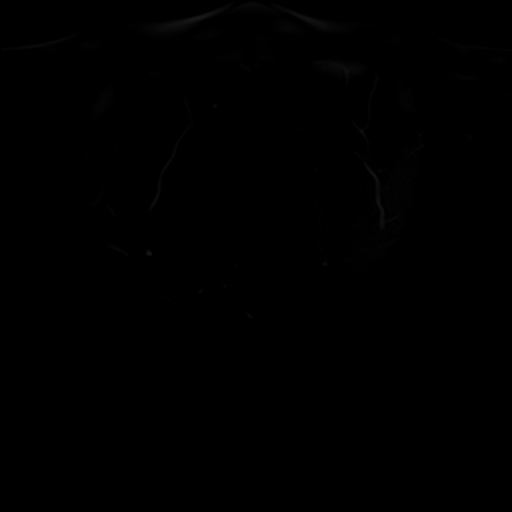
[im 5/31]
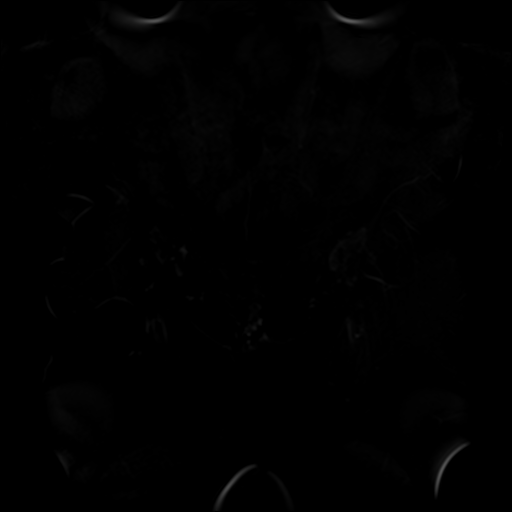
[im 9/31]
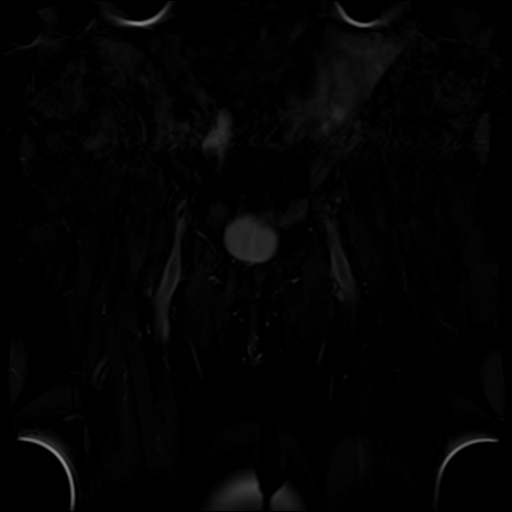
[im 13/31]
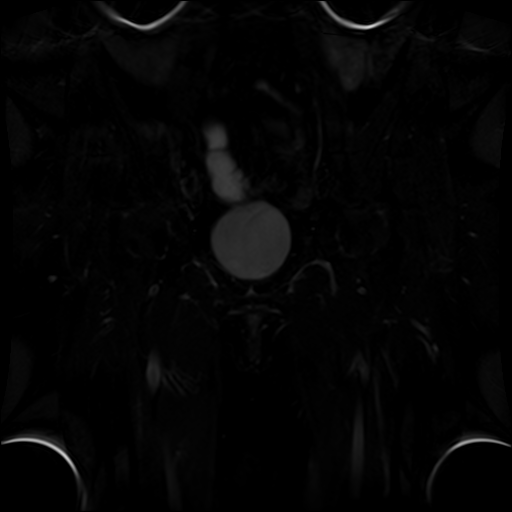
[im 18/31]
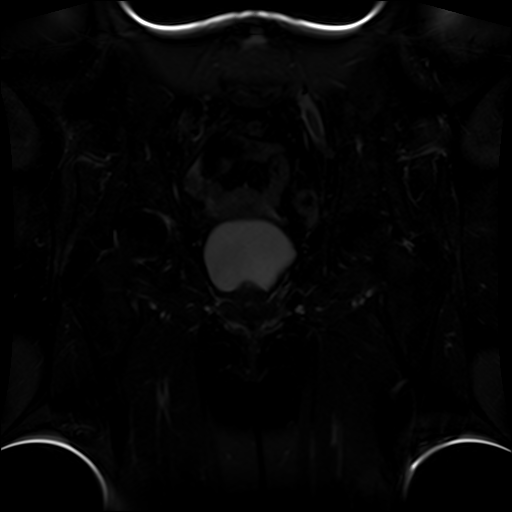
[im 22/31]
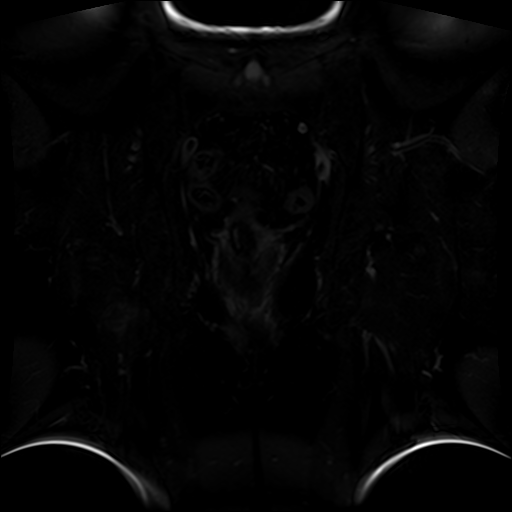
[im 26/31]
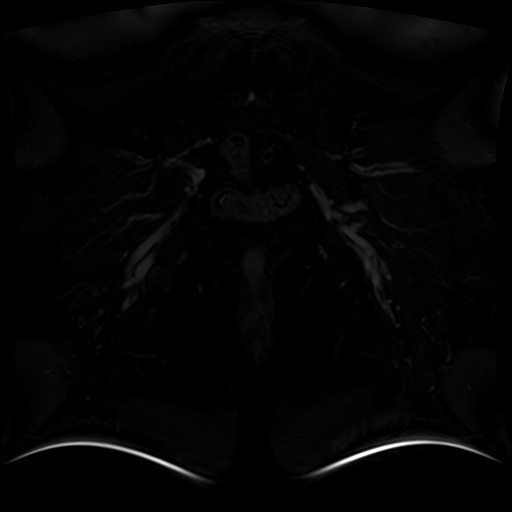
[im 31/31]
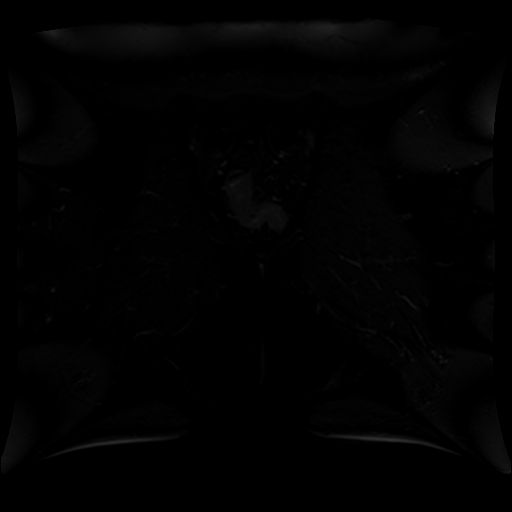

[Series 4: T1 · coronal · 4.0mm · 1.12mm/px · 7 of 31 slices shown (1 of 2)]
[im 1/31]
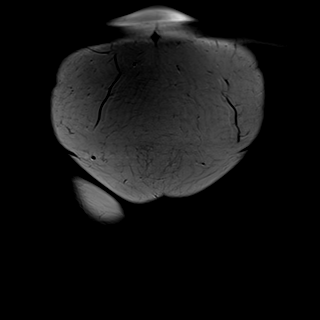
[im 6/31]
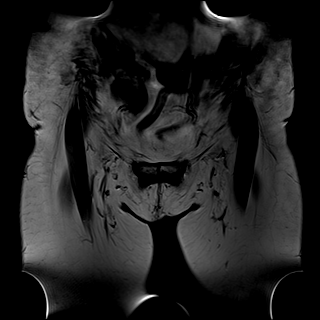
[im 11/31]
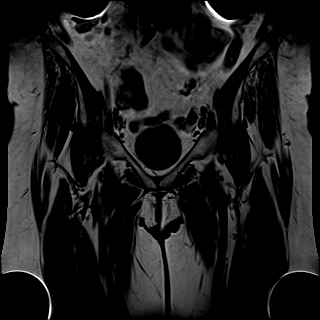
[im 16/31]
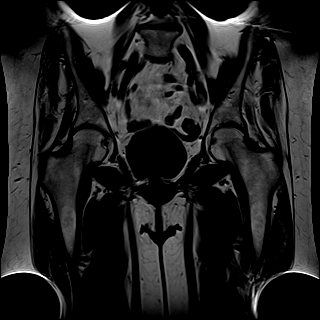
[im 21/31]
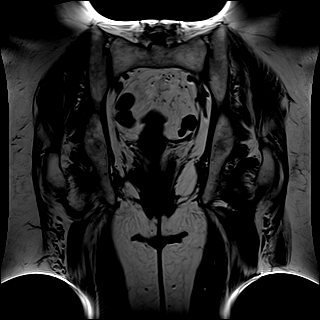
[im 26/31]
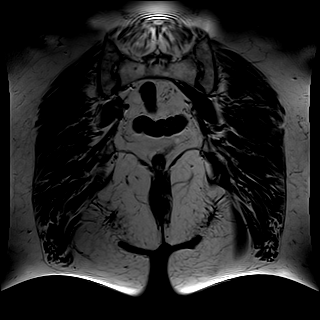
[im 31/31]
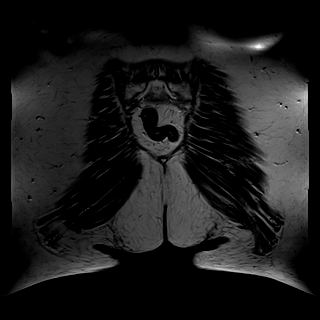

[Series 5: T1 · axial · 4.0mm · 1.00mm/px · z∈[-153,+74]mm · 8 of 43 slices shown (2 of 2)]
[im 1/43]
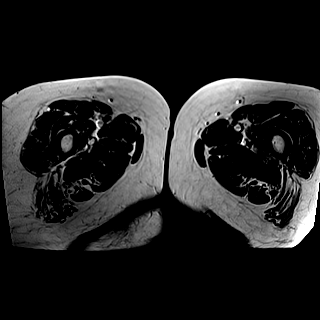
[im 5/43]
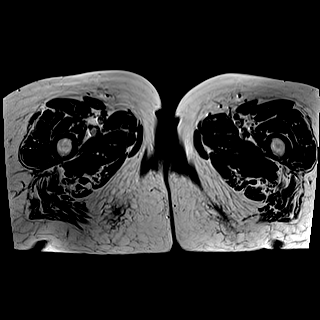
[im 15/43]
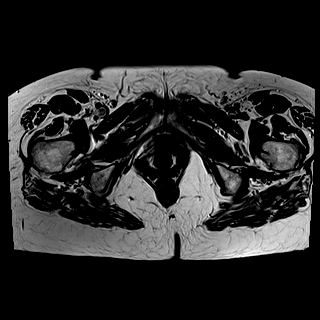
[im 19/43]
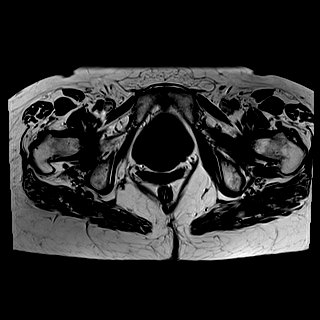
[im 24/43]
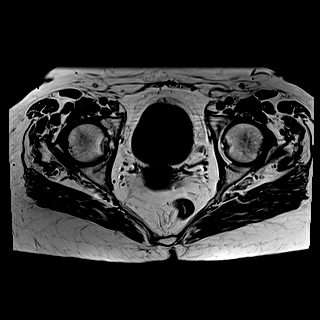
[im 29/43]
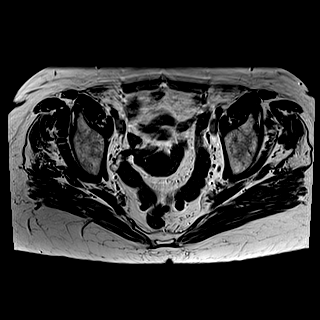
[im 38/43]
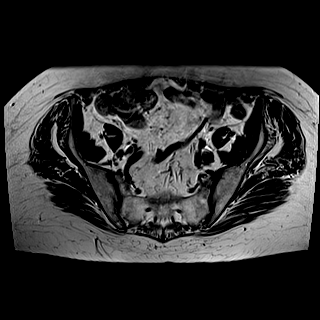
[im 43/43]
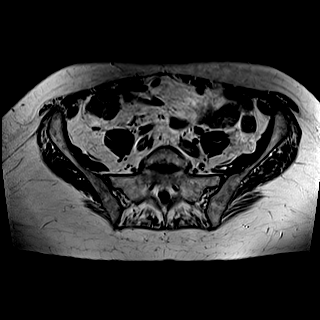

[Series 6: T2 fat-sat · axial · 4.0mm · 1.00mm/px · z∈[-153,+73]mm · 8 of 43 slices shown (1 of 2)]
[im 1/43]
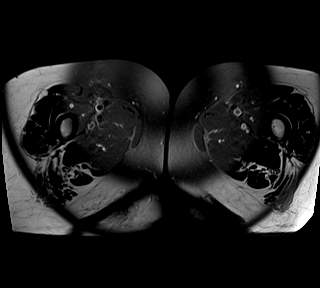
[im 5/43]
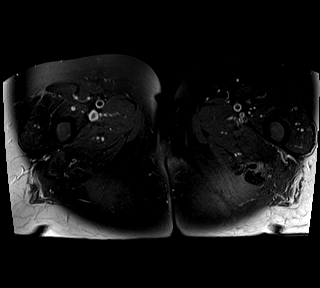
[im 15/43]
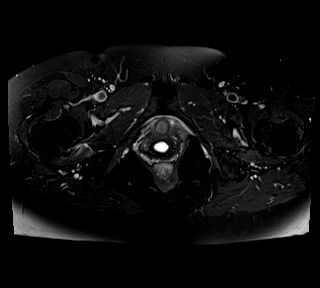
[im 19/43]
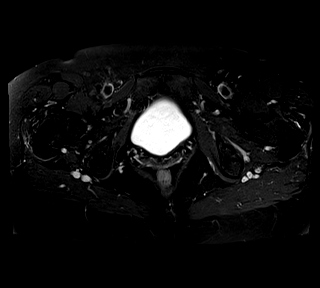
[im 24/43]
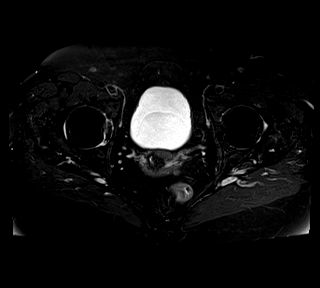
[im 29/43]
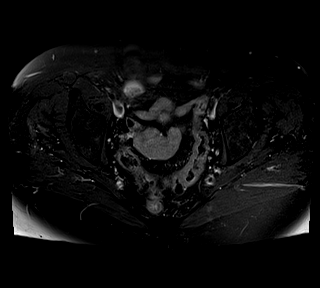
[im 38/43]
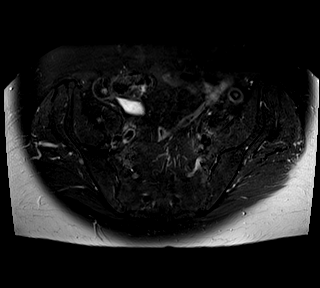
[im 43/43]
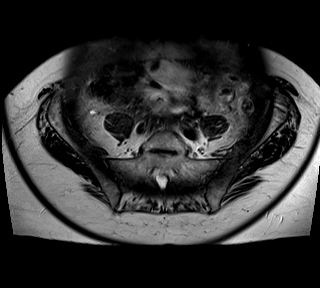

[Series 7: T2 fat-sat · sagittal · 4.0mm · 0.51mm/px · 2 of 53 slices shown (2 of 2)]
[im 1/53]
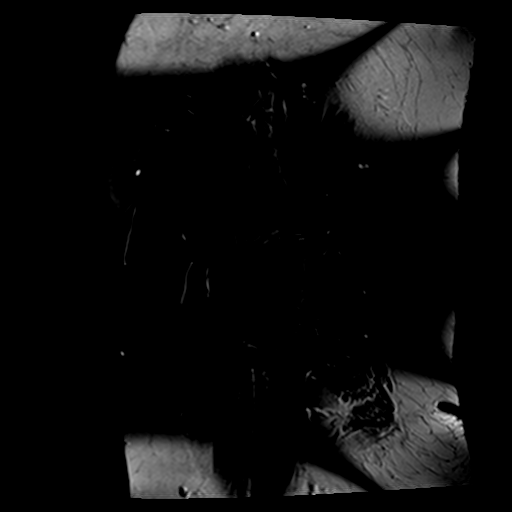
[im 9/53]
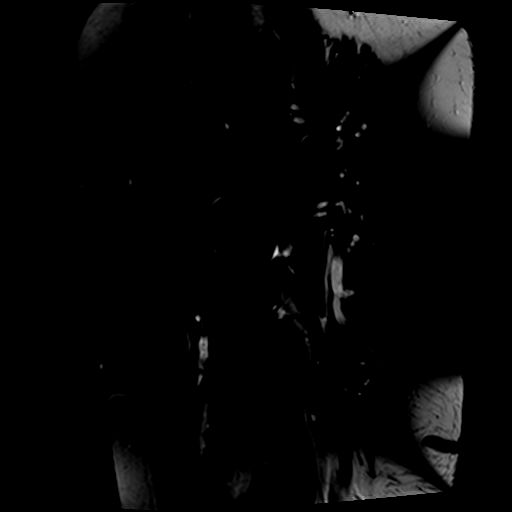

[33 of 48 positions shown; findings below may reference images not displayed]

FINDINGS: Bones:

No hip fracture, dislocation or avascular necrosis. No periosteal
reaction or bone destruction. No aggressive osseous lesion.

Normal sacrum and sacroiliac joints. No SI joint widening or erosive
changes.

Degenerative disease with disc height loss at L4-5 and L5-S1.

Articular cartilage and labrum

Articular cartilage:  No chondral defect.

Labrum: Grossly intact, but evaluation is limited by lack of
intraarticular fluid.

Joint or bursal effusion

Joint effusion:  No hip joint effusion.  No SI joint effusion.

Bursae:  No bursa formation.

Muscles and tendons

Flexors: Normal.

Extensors: Normal.

Abductors: Normal.

Adductors: Normal.

Gluteals: Normal.

Hamstrings: Normal.

Other findings

No pelvic free fluid. No fluid collection or hematoma. No inguinal
lymphadenopathy. No inguinal hernia.
IMPRESSION: 1. No acute injury of the pelvis.
2. Lower lumbar spine spondylosis.

## 2022-11-16 ENCOUNTER — Other Ambulatory Visit: Payer: Self-pay | Admitting: Physician Assistant

## 2022-11-16 NOTE — Telephone Encounter (Signed)
Prescription refill request for Xarelto received.  Indication: AF Last office visit: 06/06/22  Jeanie Cooks MD Weight: 71.1kg Age: 77 Scr: 0.81 on 07/25/22  Epic CrCl: 67.36  Based on above findings Xarelto 20mg  daily is the appropriate dose.  Refill approved.

## 2022-12-07 ENCOUNTER — Telehealth: Payer: Self-pay | Admitting: Radiology

## 2022-12-07 NOTE — Telephone Encounter (Signed)
Contacted [name] to schedule their annual wellness visit. Patient declined to schedule AWV at this time.  [name] K. CMA

## 2022-12-11 ENCOUNTER — Other Ambulatory Visit: Payer: Self-pay | Admitting: Internal Medicine

## 2023-01-05 ENCOUNTER — Ambulatory Visit (INDEPENDENT_AMBULATORY_CARE_PROVIDER_SITE_OTHER): Payer: Medicare Other

## 2023-01-05 ENCOUNTER — Encounter: Payer: Self-pay | Admitting: Podiatry

## 2023-01-05 ENCOUNTER — Ambulatory Visit (INDEPENDENT_AMBULATORY_CARE_PROVIDER_SITE_OTHER): Payer: Medicare Other | Admitting: Podiatry

## 2023-01-05 DIAGNOSIS — M778 Other enthesopathies, not elsewhere classified: Secondary | ICD-10-CM

## 2023-01-05 DIAGNOSIS — M779 Enthesopathy, unspecified: Secondary | ICD-10-CM

## 2023-01-05 MED ORDER — TRIAMCINOLONE ACETONIDE 10 MG/ML IJ SUSP
20.0000 mg | Freq: Once | INTRAMUSCULAR | Status: AC
Start: 2023-01-05 — End: 2023-01-05
  Administered 2023-01-05: 20 mg

## 2023-01-05 NOTE — Progress Notes (Signed)
Subjective:   Patient ID: [name], female   DOB: 77 y.o.   MRN: [id number]   HPI Patient presents with a lot of pain midfoot right over left of a number of years duration.  States that she has had this going on for a while she takes 2 Tylenol 1 tramadol and is getting ready to go on a trip to [place].  States very tender with pressure with prominence right over left.  Patient does not smoke likes to be active   Review of Systems  All other systems reviewed and are negative.       Objective:  Physical Exam Vitals and nursing note reviewed.  Constitutional:      Appearance: She is well-developed.  Pulmonary:     Effort: Pulmonary effort is normal.  Musculoskeletal:        General: Normal range of motion.  Skin:    General: Skin is warm.  Neurological:     Mental Status: She is alert.     Neurovascular status intact muscle strength adequate range of motion found to be adequate with moderate A-fib noted.  Patient has prominence around the midtarsal joint bilateral with spur formation right over left painful when pressed making shoe gear difficult.  Patient has good digital perfusion well-oriented x 3     Assessment:  Midtarsal joint arthritis right over left with bone spur formation right over left and chronic pain of the extensor complex     Plan:  H&P x-rays reviewed.  Today went ahead did sterile prep injected the midtarsal joint extensor complex 3 mg Dexasone Kenalog 5 mg Xylocaine bilateral.  I discussed the spur on the right that may need to be resected someday depending on how she responds to medication and that decision will be made based on response  X-rays indicate there is a large spur at the midtarsal joint right with quite a bit of arthritis noted in the second metatarsal cuneiform joint bilateral

## 2023-01-10 DIAGNOSIS — R262 Difficulty in walking, not elsewhere classified: Secondary | ICD-10-CM | POA: Diagnosis not present

## 2023-01-10 DIAGNOSIS — M25551 Pain in right hip: Secondary | ICD-10-CM | POA: Diagnosis not present

## 2023-01-12 ENCOUNTER — Encounter: Payer: Self-pay | Admitting: Internal Medicine

## 2023-01-16 ENCOUNTER — Other Ambulatory Visit: Payer: Self-pay

## 2023-01-16 MED ORDER — DILTIAZEM HCL ER COATED BEADS 180 MG PO CP24: 180.0000 mg | ORAL_CAPSULE | Freq: Two times a day (BID) | ORAL | 3 refills | Status: DC

## 2023-01-16 MED ORDER — DILTIAZEM HCL 30 MG PO TABS: ORAL_TABLET | ORAL | 7 refills | Status: DC

## 2023-01-16 MED ORDER — LISINOPRIL 40 MG PO TABS: 40.0000 mg | ORAL_TABLET | Freq: Every day | ORAL | 1 refills | Status: DC

## 2023-01-16 MED ORDER — HYDROCHLOROTHIAZIDE 25 MG PO TABS: 25.0000 mg | ORAL_TABLET | Freq: Every day | ORAL | 1 refills | Status: DC

## 2023-01-16 MED ORDER — RIVAROXABAN 20 MG PO TABS: 20.0000 mg | ORAL_TABLET | Freq: Every day | ORAL | 1 refills | Status: DC

## 2023-01-16 MED ORDER — TRAMADOL HCL 50 MG PO TABS: 50.0000 mg | ORAL_TABLET | Freq: Two times a day (BID) | ORAL | 0 refills | Status: DC | PRN

## 2023-01-16 MED ORDER — PRAVASTATIN SODIUM 40 MG PO TABS: 40.0000 mg | ORAL_TABLET | Freq: Every day | ORAL | 3 refills | Status: DC

## 2023-01-19 ENCOUNTER — Other Ambulatory Visit: Payer: Self-pay | Admitting: Internal Medicine

## 2023-01-21 NOTE — Patient Instructions (Addendum)
      Blood work was ordered.   The lab is on the first floor.    Medications changes include :   none       Return in about 6 months (around 07/24/2023) for follow up.

## 2023-01-21 NOTE — Progress Notes (Signed)
Subjective:    Patient ID: [name], female    DOB: [birth year], 77 y.o.   MRN: [id number]     HPI [name] is here for follow up of her chronic medical problems.  Zyrtec helped with itching in legs.  Still has itching - occ wet sensation or sensation of something brushing against legs.   Medications and allergies reviewed with patient and updated if appropriate.  Current Outpatient Medications on File Prior to Visit  Medication Sig Dispense Refill   acetaminophen (TYLENOL) 650 MG CR tablet Take 650 mg by mouth 2 (two) times daily.      bifidobacterium infantis (ALIGN) capsule Take 1 capsule by mouth daily. 30 capsule 11   cholecalciferol (VITAMIN D) 1000 UNITS tablet Take 2,000 Units by mouth daily.     co-enzyme Q-10 50 MG capsule Take 50 mg by mouth daily.     diltiazem (CARDIZEM CD) 180 MG 24 hr capsule Take 1 capsule (180 mg total) by mouth 2 (two) times daily. 180 capsule 3   hydrochlorothiazide (HYDRODIURIL) 25 MG tablet Take 1 tablet (25 mg total) by mouth daily. 90 tablet 1   KLOR-CON M10 10 MEQ tablet TAKE 1 TABLET BY MOUTH THREE TIMES A DAY 270 tablet 1   lisinopril (ZESTRIL) 40 MG tablet Take 1 tablet (40 mg total) by mouth daily. 90 tablet 1   magnesium oxide (MAG-OX) 400 MG tablet Take 400 mg by mouth daily.     pravastatin (PRAVACHOL) 40 MG tablet Take 1 tablet (40 mg total) by mouth daily. 90 tablet 3   rivaroxaban (XARELTO) 20 MG TABS tablet Take 1 tablet (20 mg total) by mouth daily with supper. 90 tablet 1   traMADol (ULTRAM) 50 MG tablet TAKE 1 TABLET BY MOUTH EVERY 12 HOURS AS NEEDED. 180 tablet 0   No current facility-administered medications on file prior to visit.     Review of Systems  Constitutional:  Negative for fever.  Respiratory:  Negative for cough, shortness of breath and wheezing.   Cardiovascular:  Negative for chest pain, palpitations and leg swelling.  Neurological:  Negative for light-headedness and headaches.        Objective:   Vitals:   01/22/23 0756  BP: 122/78  Pulse: 70  Temp: 97.9 F (36.6 C)  SpO2: 98%   BP Readings from Last 3 Encounters:  01/22/23 122/78  07/25/22 120/76  06/06/22 122/68   Wt Readings from Last 3 Encounters:  01/22/23 157 lb 3.2 oz (71.3 kg)  07/25/22 161 lb (73 kg)  06/06/22 156 lb 12.8 oz (71.1 kg)   Body mass index is 26.98 kg/m.    Physical Exam Constitutional:      General: She is not in acute distress.    Appearance: Normal appearance.  HENT:     Head: Normocephalic and atraumatic.  Eyes:     Conjunctiva/sclera: Conjunctivae normal.  Cardiovascular:     Rate and Rhythm: Normal rate. Rhythm irregular.     Heart sounds: Normal heart sounds.  Pulmonary:     Effort: Pulmonary effort is normal. No respiratory distress.     Breath sounds: Normal breath sounds. No wheezing.  Musculoskeletal:     Cervical back: Neck supple.     Right lower leg: No edema.     Left lower leg: No edema.  Lymphadenopathy:     Cervical: No cervical adenopathy.  Skin:    General: Skin is warm and dry.     Findings: No rash.  Neurological:     Mental Status: She is alert. Mental status is at baseline.  Psychiatric:        Mood and Affect: Mood normal.        Behavior: Behavior normal.        Lab Results  Component Value Date   WBC 7.1 07/25/2022   HGB 14.7 07/25/2022   HCT 42.3 07/25/2022   PLT 253.0 07/25/2022   GLUCOSE 87 07/25/2022   CHOL 197 07/25/2022   TRIG 163.0 (H) 07/25/2022   HDL 66.20 07/25/2022   LDLDIRECT 87.0 01/23/2022   LDLCALC 98 07/25/2022   ALT 12 07/25/2022   AST 17 07/25/2022   NA 138 07/25/2022   K 3.6 07/25/2022   CL 101 07/25/2022   CREATININE 0.81 07/25/2022   BUN 20 07/25/2022   CO2 30 07/25/2022   TSH 1.71 11/17/2020   INR 1.4 01/25/2010   HGBA1C 5.9 07/25/2022     Assessment & Plan:    See Problem List for Assessment and Plan of chronic medical problems.

## 2023-01-22 ENCOUNTER — Encounter: Payer: Self-pay | Admitting: Internal Medicine

## 2023-01-22 ENCOUNTER — Ambulatory Visit (INDEPENDENT_AMBULATORY_CARE_PROVIDER_SITE_OTHER): Payer: Medicare Other | Admitting: Internal Medicine

## 2023-01-22 VITALS — BP 122/78 | HR 70 | Temp 97.9°F | Ht 64.0 in | Wt 157.2 lb

## 2023-01-22 DIAGNOSIS — I10 Essential (primary) hypertension: Secondary | ICD-10-CM | POA: Diagnosis not present

## 2023-01-22 DIAGNOSIS — R7303 Prediabetes: Secondary | ICD-10-CM

## 2023-01-22 DIAGNOSIS — E782 Mixed hyperlipidemia: Secondary | ICD-10-CM

## 2023-01-22 DIAGNOSIS — D6869 Other thrombophilia: Secondary | ICD-10-CM

## 2023-01-22 DIAGNOSIS — L299 Pruritus, unspecified: Secondary | ICD-10-CM | POA: Diagnosis not present

## 2023-01-22 DIAGNOSIS — M150 Primary generalized (osteo)arthritis: Secondary | ICD-10-CM | POA: Diagnosis not present

## 2023-01-22 DIAGNOSIS — E538 Deficiency of other specified B group vitamins: Secondary | ICD-10-CM | POA: Diagnosis not present

## 2023-01-22 DIAGNOSIS — I4821 Permanent atrial fibrillation: Secondary | ICD-10-CM

## 2023-01-22 LAB — COMPREHENSIVE METABOLIC PANEL
ALT: 12 U/L (ref 0–35)
AST: 14 U/L (ref 0–37)
Albumin: 4.3 g/dL (ref 3.5–5.2)
Alkaline Phosphatase: 78 U/L (ref 39–117)
BUN: 26 mg/dL — ABNORMAL HIGH (ref 6–23)
CO2: 29 mEq/L (ref 19–32)
Calcium: 9.4 mg/dL (ref 8.4–10.5)
Chloride: 103 mEq/L (ref 96–112)
Creatinine, Ser: 0.9 mg/dL (ref 0.40–1.20)
GFR: 62.37 mL/min (ref >60.00)
Glucose, Bld: 83 mg/dL (ref 70–99)
Potassium: 4.4 mEq/L (ref 3.5–5.1)
Sodium: 138 mEq/L (ref 135–145)
Total Bilirubin: 0.5 mg/dL (ref 0.2–1.2)
Total Protein: 7.1 g/dL (ref 6.0–8.3)

## 2023-01-22 LAB — CBC WITH DIFFERENTIAL/PLATELET
Basophils Absolute: 0.1 10*3/uL (ref 0.0–0.1)
Basophils Relative: 0.8 % (ref 0.0–3.0)
Eosinophils Absolute: 0.1 10*3/uL (ref 0.0–0.7)
Eosinophils Relative: 1.3 % (ref 0.0–5.0)
HCT: 43.7 % (ref 36.0–46.0)
Hemoglobin: 14.6 g/dL (ref 12.0–15.0)
Lymphocytes Relative: 25.6 % (ref 12.0–46.0)
Lymphs Abs: 2.6 10*3/uL (ref 0.7–4.0)
MCHC: 33.4 g/dL (ref 30.0–36.0)
MCV: 96.9 fl (ref 78.0–100.0)
Monocytes Absolute: 0.9 10*3/uL (ref 0.1–1.0)
Monocytes Relative: 8.8 % (ref 3.0–12.0)
Neutro Abs: 6.4 10*3/uL (ref 1.4–7.7)
Neutrophils Relative %: 63.5 % (ref 43.0–77.0)
Platelets: 249 10*3/uL (ref 150.0–400.0)
RBC: 4.51 Mil/uL (ref 3.87–5.11)
RDW: 13.5 % (ref 11.5–15.5)
WBC: 10.1 10*3/uL (ref 4.0–10.5)

## 2023-01-22 LAB — LIPID PANEL
Cholesterol: 191 mg/dL (ref 0–200)
HDL: 59.9 mg/dL (ref >39.00)
LDL Cholesterol: 106 mg/dL — ABNORMAL HIGH (ref 0–99)
NonHDL: 131.05
Total CHOL/HDL Ratio: 3
Triglycerides: 127 mg/dL (ref 0.0–149.0)
VLDL: 25.4 mg/dL (ref 0.0–40.0)

## 2023-01-22 LAB — VITAMIN B12: Vitamin B-12: 241 pg/mL (ref 211–911)

## 2023-01-22 LAB — HEMOGLOBIN A1C: Hgb A1c MFr Bld: 5.6 % (ref 4.6–6.5)

## 2023-01-22 MED ORDER — DILTIAZEM HCL 30 MG PO TABS: ORAL_TABLET | ORAL | 2 refills | Status: AC

## 2023-01-22 NOTE — Assessment & Plan Note (Signed)
Chronic Regular exercise and healthy diet encouraged Check lipid panel  Continue pravastatin 40 mg daily 

## 2023-01-22 NOTE — Assessment & Plan Note (Signed)
Chronic Blood pressure well controlled CMP Continue diltiazem 180 mg twice daily, HCTZ 25 mg daily, lisinopril 40 mg daily 

## 2023-01-22 NOTE — Assessment & Plan Note (Signed)
Chronic Following with orthopedics for different joints Continue tramadol 50 mg twice daily as needed 

## 2023-01-22 NOTE — Assessment & Plan Note (Signed)
Chronic Has had this for years Zyrtec 10 mg daily helps  but still itching Try otc cream that is an anti-itch Can increase zyrtec a little if needed

## 2023-01-22 NOTE — Assessment & Plan Note (Signed)
Chronic Following with cardiology On Xarelto 20 mg daily, diltiazem 180 mg twice daily CBC, CMP 

## 2023-01-22 NOTE — Assessment & Plan Note (Signed)
Chronic Not currently taking B12 Check level 

## 2023-01-22 NOTE — Assessment & Plan Note (Signed)
Chronic Secondary to Afib On xarelto 

## 2023-01-22 NOTE — Assessment & Plan Note (Signed)
Chronic Check a1c Low sugar / carb diet Stressed regular exercise  

## 2023-01-25 ENCOUNTER — Ambulatory Visit: Payer: Medicare Other | Admitting: Internal Medicine

## 2023-04-16 ENCOUNTER — Other Ambulatory Visit: Payer: Self-pay | Admitting: Internal Medicine

## 2023-05-29 ENCOUNTER — Ambulatory Visit
Admission: RE | Admit: 2023-05-29 | Discharge: 2023-05-29 | Disposition: A | Discharge status: Home / Self Care | Payer: Medicare Other | Source: Ambulatory Visit | Attending: Internal Medicine | Admitting: Internal Medicine

## 2023-05-29 DIAGNOSIS — Z1231 Encounter for screening mammogram for malignant neoplasm of breast: Secondary | ICD-10-CM

## 2023-05-29 DIAGNOSIS — Z23 Encounter for immunization: Secondary | ICD-10-CM | POA: Diagnosis not present

## 2023-06-12 DIAGNOSIS — L578 Other skin changes due to chronic exposure to nonionizing radiation: Secondary | ICD-10-CM | POA: Diagnosis not present

## 2023-06-12 DIAGNOSIS — D224 Melanocytic nevi of scalp and neck: Secondary | ICD-10-CM | POA: Diagnosis not present

## 2023-06-12 DIAGNOSIS — D2230 Melanocytic nevi of unspecified part of face: Secondary | ICD-10-CM | POA: Diagnosis not present

## 2023-06-12 DIAGNOSIS — L821 Other seborrheic keratosis: Secondary | ICD-10-CM | POA: Diagnosis not present

## 2023-06-12 DIAGNOSIS — D225 Melanocytic nevi of trunk: Secondary | ICD-10-CM | POA: Diagnosis not present

## 2023-06-12 DIAGNOSIS — D485 Neoplasm of uncertain behavior of skin: Secondary | ICD-10-CM | POA: Diagnosis not present

## 2023-06-12 DIAGNOSIS — L738 Other specified follicular disorders: Secondary | ICD-10-CM | POA: Diagnosis not present

## 2023-06-12 DIAGNOSIS — L720 Epidermal cyst: Secondary | ICD-10-CM | POA: Diagnosis not present

## 2023-06-12 DIAGNOSIS — Z86018 Personal history of other benign neoplasm: Secondary | ICD-10-CM | POA: Diagnosis not present

## 2023-06-13 DIAGNOSIS — H52203 Unspecified astigmatism, bilateral: Secondary | ICD-10-CM | POA: Diagnosis not present

## 2023-06-13 DIAGNOSIS — H35363 Drusen (degenerative) of macula, bilateral: Secondary | ICD-10-CM | POA: Diagnosis not present

## 2023-06-13 DIAGNOSIS — Z961 Presence of intraocular lens: Secondary | ICD-10-CM | POA: Diagnosis not present

## 2023-06-20 NOTE — Progress Notes (Unsigned)
Cardiology Office Note Date:  06/20/2023  Patient ID:  Kristin, [name] June 19, 1946, MRN [id number] PCP:  Pincus Sanes, MD  Cardiologist/Electrophysiologist: Dr. Johney Frame     Chief Complaint: *** annual visit  History of Present Illness: [name] is a 77 y.o. female with history of AFib, HTN.  She comes in today to be seen for Dr. Johney Frame.  Last seen by him via a tele health visit Aug 2020.  Doing well with rate controlled AFib for years.  Mentioned some CP that she suspected was musculoskeletal s/s workout at the gym, but worried her, planned for stress testing. She had f/o with Uvaldo Rising, NP in Sept.  She was very active without CP, her myoview low risk.  CP felt to be very atypical and pt suspected stress/anxiety related.  ------  Pending colonoscopy, needs pre-procedure cardiac eval Needs to hold xarelto   RCRI score is zero, 0.4% risk  I saw her 11/23/21 Again doing well Infrequently with take a PRN dilt for sense of increased HRs She continues to be active, planning a [place] day stay in the [place] coming [place] and then [facility], also planning a trip to [place] as well. No CP, SOD, OE No near syncope or syncope. No bleeding or signs of bleeding Colonoscopy is routine/screening, though she does have chronic bowel issues No contraindication to colonoscopy No changes were made Planned to transition to Dr. Lalla Brothers  06/06/22, saw Dr. Lalla Brothers, doing well, no changes were made.  *** xarelto, dose, bleeding *** symptoms, rate  Past Medical History:  Diagnosis Date   ARTHRITIS, HIPS, BILATERAL    Atrial fibrillation (HCC)    chronic anticoag - Pradaxa   BURSITIS, SUBTROCHANTERIC    HYPERTENSION    MITRAL VALVE PROLAPSE    Mixed hyperlipidemia     Past Surgical History:  Procedure Laterality Date   DILATION AND CURETTAGE OF UTERUS     TONSILLECTOMY      Current Outpatient Medications  Medication Sig Dispense Refill   acetaminophen (TYLENOL) 650 MG  CR tablet Take 650 mg by mouth 2 (two) times daily.      bifidobacterium infantis (ALIGN) capsule Take 1 capsule by mouth daily. 30 capsule 11   cholecalciferol (VITAMIN D) 1000 UNITS tablet Take 2,000 Units by mouth daily.     co-enzyme Q-10 50 MG capsule Take 50 mg by mouth daily.     diltiazem (CARDIZEM CD) 180 MG 24 hr capsule Take 1 capsule (180 mg total) by mouth 2 (two) times daily. 180 capsule 3   diltiazem (CARDIZEM) 30 MG tablet TAKE 1 TABLET EVERY 4 HOURS AS NEEDED FOR AFIB HEART RATE >100 30 tablet 2   hydrochlorothiazide (HYDRODIURIL) 25 MG tablet TAKE 1 TABLET (25 MG TOTAL) BY MOUTH DAILY. 90 tablet 1   KLOR-CON M10 10 MEQ tablet TAKE 1 TABLET BY MOUTH THREE TIMES A DAY 270 tablet 1   lisinopril (ZESTRIL) 40 MG tablet Take 1 tablet (40 mg total) by mouth daily. 90 tablet 1   magnesium oxide (MAG-OX) 400 MG tablet Take 400 mg by mouth daily.     pravastatin (PRAVACHOL) 40 MG tablet Take 1 tablet (40 mg total) by mouth daily. 90 tablet 3   rivaroxaban (XARELTO) 20 MG TABS tablet Take 1 tablet (20 mg total) by mouth daily with supper. 90 tablet 1   traMADol (ULTRAM) 50 MG tablet TAKE 1 TABLET BY MOUTH EVERY 12 HOURS AS NEEDED. 180 tablet 0   No current facility-administered  medications for this visit.    Allergies:   Nickel   Social History:  The patient  reports that she has quit smoking. Her smoking use included cigarettes. She has never used smokeless tobacco. She reports that she does not drink alcohol and does not use drugs.   Family History:  The patient's family history includes Heart failure in her mother; Hypertension in her mother; Leukemia in her father.  ROS:  Please see the history of present illness.    All other systems are reviewed and otherwise negative.   PHYSICAL EXAM:  VS:  There were no vitals taken for this visit. BMI: There is no height or weight on file to calculate BMI. Well nourished, well developed, in no acute distress HEENT: normocephalic,  atraumatic Neck: no JVD, carotid bruits or masses Cardiac: *** irreg-irreg; no significant murmurs, no rubs, or gallops Lungs:  ***  CTA b/l, no wheezing, rhonchi or rales Abd: soft, nontender MS: no deformity or atrophy Ext: *** no edema Skin: warm and dry, no rash Neuro:  No gross deficits appreciated Psych: euthymic mood, full affect   EKG:  done today and reviewed by myself: ***   04/08/2019; Lexiscan stress myoview Nuclear stress EF: 71%. There was no ST segment deviation noted during stress. The study is normal. This is a low risk study. The left ventricular ejection fraction is hyperdynamic (>65%).    09/10/2014; TTE Study Conclusions  - Left ventricle: The cavity size was normal. There was mild    concentric hypertrophy. Systolic function was normal. The    estimated ejection fraction was in the range of 60% to 65%. Wall    motion was normal; there were no regional wall motion    abnormalities. The study was not technically sufficient to allow    evaluation of LV diastolic dysfunction due to atrial    fibrillation.  - Aortic valve: Trileaflet; normal thickness leaflets. There was no    regurgitation.  - Aortic root: The aortic root was normal in size.  - Mitral valve: Structurally normal valve.  - Left atrium: The atrium was mildly dilated.  - Right ventricle: The cavity size was normal. Wall thickness was    normal. Systolic function was normal.  - Right atrium: The atrium was normal in size.  - Tricuspid valve: There was mild regurgitation.  - Pulmonic valve: There was trivial regurgitation.  - Pulmonary arteries: Systolic pressure was within the normal    range. PA peak pressure: 32 mm Hg (S).  - Inferior vena cava: The vessel was normal in size.  - Pericardium, extracardiac: There was no pericardial effusion.    Recent Labs: 01/22/2023: ALT 12; BUN 26; Creatinine, Ser 0.90; Hemoglobin 14.6; Platelets 249.0; Potassium 4.4; Sodium 138  01/22/2023: Cholesterol  191; HDL 59.90; LDL Cholesterol 106; Total CHOL/HDL Ratio 3; Triglycerides 127.0; VLDL 25.4   CrCl cannot be calculated (Patient's most recent lab result is older than the maximum 21 days allowed.).   Wt Readings from Last 3 Encounters:  01/22/23 157 lb 3.2 oz (71.3 kg)  07/25/22 161 lb (73 kg)  06/06/22 156 lb 12.8 oz (71.1 kg)     Other studies reviewed: Additional studies/records reviewed today include: summarized above  ASSESSMENT AND PLAN:  1. Longstanding persistent AFib     CHA2DS2Vasc is 3, on Xarelto, *** appropriately dosed     *** Rate controlled     *** No changes today, she is happy with her current regime  2. HTN   ***    No changes   Disposition:  ***    Current medicines are reviewed at length with the patient today.  The patient did not have any concerns regarding medicines.  Norma Fredrickson, PA-C 06/20/2023 7:25 AM     [facility] [address] [place] [address] [phone] (office)  [phone] (fax)

## 2023-06-21 ENCOUNTER — Encounter: Payer: Self-pay | Admitting: Physician Assistant

## 2023-06-21 ENCOUNTER — Ambulatory Visit: Payer: Medicare Other | Attending: Physician Assistant | Admitting: Physician Assistant

## 2023-06-21 VITALS — BP 132/72 | HR 81 | Ht 64.0 in | Wt 163.0 lb

## 2023-06-21 DIAGNOSIS — D6869 Other thrombophilia: Secondary | ICD-10-CM

## 2023-06-21 DIAGNOSIS — I10 Essential (primary) hypertension: Secondary | ICD-10-CM

## 2023-06-21 DIAGNOSIS — I4821 Permanent atrial fibrillation: Secondary | ICD-10-CM

## 2023-06-21 NOTE — Patient Instructions (Signed)
Medication Instructions:  Your physician recommends that you continue on your current medications as directed. Please refer to the Current Medication list given to you today.  *If you need a refill on your cardiac medications before your next appointment, please call your pharmacy*   Lab Work: None ordered   If you have labs (blood work) drawn today and your tests are completely normal, you will receive your results only by: MyChart Message (if you have MyChart) OR A paper copy in the mail If you have any lab test that is abnormal or we need to change your treatment, we will call you to review the results.   Testing/Procedures: None ordered    Follow-Up: At [facility], you and your health needs are our priority.  As part of our continuing mission to provide you with exceptional heart care, we have created designated Provider Care Teams.  These Care Teams include your primary Cardiologist (physician) and Advanced Practice Providers (APPs -  Physician Assistants and Nurse Practitioners) who all work together to provide you with the care you need, when you need it.  We recommend signing up for the patient portal called "MyChart".  Sign up information is provided on this After Visit Summary.  MyChart is used to connect with patients for Virtual Visits (Telemedicine).  Patients are able to view lab/test results, encounter notes, upcoming appointments, etc.  Non-urgent messages can be sent to your provider as well.   To learn more about what you can do with MyChart, go to [email or web address].    Your next appointment:   12 month(s)  Provider:   Dr. Steffanie Dunn or Francis Dowse PA-C    Other Instructions

## 2023-07-07 ENCOUNTER — Other Ambulatory Visit: Payer: Self-pay | Admitting: Internal Medicine

## 2023-07-23 ENCOUNTER — Ambulatory Visit (INDEPENDENT_AMBULATORY_CARE_PROVIDER_SITE_OTHER): Payer: Medicare Other

## 2023-07-23 VITALS — Ht 64.0 in | Wt 163.0 lb

## 2023-07-23 DIAGNOSIS — M9906 Segmental and somatic dysfunction of lower extremity: Secondary | ICD-10-CM | POA: Diagnosis not present

## 2023-07-23 DIAGNOSIS — M9905 Segmental and somatic dysfunction of pelvic region: Secondary | ICD-10-CM | POA: Diagnosis not present

## 2023-07-23 DIAGNOSIS — Z0000 Encounter for general adult medical examination without abnormal findings: Secondary | ICD-10-CM

## 2023-07-23 DIAGNOSIS — M25551 Pain in right hip: Secondary | ICD-10-CM | POA: Diagnosis not present

## 2023-07-23 DIAGNOSIS — M1712 Unilateral primary osteoarthritis, left knee: Secondary | ICD-10-CM | POA: Diagnosis not present

## 2023-07-23 DIAGNOSIS — M6281 Muscle weakness (generalized): Secondary | ICD-10-CM | POA: Diagnosis not present

## 2023-07-23 DIAGNOSIS — M9902 Segmental and somatic dysfunction of thoracic region: Secondary | ICD-10-CM | POA: Diagnosis not present

## 2023-07-23 NOTE — Progress Notes (Signed)
Subjective:   [name] is a 77 y.o. female who presents for Medicare Annual (Subsequent) preventive examination.  Visit Complete: Virtual I connected with  Lonzo Candy on 07/23/23 by a audio enabled telemedicine application and verified that I am speaking with the correct person using two identifiers.  Patient Location: Home  Provider Location: Home Office  I discussed the limitations of evaluation and management by telemedicine. The patient expressed understanding and agreed to proceed.  Vital Signs: Because this visit was a virtual/telehealth visit, some criteria may be missing or patient reported. Any vitals not documented were not able to be obtained and vitals that have been documented are patient reported.  P  Cardiac Risk Factors include: advanced age (>62men, >60 women);hypertension;dyslipidemia;Other (see comment), Risk factor comments: A-Fib, Osteopenia, Prediabetes, IBS     Objective:    Today's Vitals   07/23/23 0956  Weight: 163 lb (73.9 kg)  Height: 5\' 4"  (1.626 m)   Body mass index is 27.98 kg/m.     07/23/2023   10:02 AM 07/20/2022    3:34 PM 06/20/2019   10:28 AM 04/18/2018    8:26 AM 11/15/2016    3:31 PM 10/11/2016    9:57 AM 03/28/2016    9:36 AM  Advanced Directives  Does Patient Have a Medical Advance Directive? Yes No Yes Yes Yes Yes Yes  Type of Estate agent of [place];Living will  Healthcare Power of [place];Living will Healthcare Power of [place];Living will Healthcare Power of [place];Living will Healthcare Power of [place];Living will   Copy of Healthcare Power of Attorney in Chart? No - copy requested  No - copy requested No - copy requested No - copy requested  No - copy requested  Would patient like information on creating a medical advance directive?  No - Patient declined   No - Patient declined      Current Medications (verified) Outpatient Encounter Medications as of 07/23/2023  Medication Sig    acetaminophen (TYLENOL) 650 MG CR tablet Take 650 mg by mouth 2 (two) times daily.    bifidobacterium infantis (ALIGN) capsule Take 1 capsule by mouth daily.   cholecalciferol (VITAMIN D) 1000 UNITS tablet Take 2,000 Units by mouth daily.   co-enzyme Q-10 50 MG capsule Take 50 mg by mouth daily.   diltiazem (CARDIZEM CD) 180 MG 24 hr capsule Take 1 capsule (180 mg total) by mouth 2 (two) times daily.   diltiazem (CARDIZEM) 30 MG tablet TAKE 1 TABLET EVERY 4 HOURS AS NEEDED FOR AFIB HEART RATE >100   hydrochlorothiazide (HYDRODIURIL) 25 MG tablet TAKE 1 TABLET (25 MG TOTAL) BY MOUTH DAILY.   KLOR-CON M10 10 MEQ tablet TAKE 1 TABLET BY MOUTH THREE TIMES A DAY   lisinopril (ZESTRIL) 40 MG tablet Take 1 tablet (40 mg total) by mouth daily.   magnesium oxide (MAG-OX) 400 MG tablet Take 400 mg by mouth daily.   pravastatin (PRAVACHOL) 40 MG tablet Take 1 tablet (40 mg total) by mouth daily.   rivaroxaban (XARELTO) 20 MG TABS tablet Take 1 tablet (20 mg total) by mouth daily with supper.   traMADol (ULTRAM) 50 MG tablet TAKE 1 TABLET BY MOUTH EVERY 12 HOURS AS NEEDED.   No facility-administered encounter medications on file as of 07/23/2023.    Allergies (verified) Nickel   History: Past Medical History:  Diagnosis Date   ARTHRITIS, HIPS, BILATERAL    Atrial fibrillation (HCC)    chronic anticoag - Pradaxa   BURSITIS, SUBTROCHANTERIC  HYPERTENSION    MITRAL VALVE PROLAPSE    Mixed hyperlipidemia    Past Surgical History:  Procedure Laterality Date   DILATION AND CURETTAGE OF UTERUS     TONSILLECTOMY     Family History  Problem Relation Age of Onset   Hypertension Mother    Heart failure Mother    Leukemia Father    Social History   Socioeconomic History   Marital status: Married    Spouse name: Maurine Minister   Number of children: 1   Years of education: Not on file   Highest education level: Not on file  Occupational History   Occupation: Retired  Tobacco Use   Smoking  status: Former    Types: Cigarettes   Smokeless tobacco: Never   Tobacco comments:    social age 34  Vaping Use   Vaping status: Never Used  Substance and Sexual Activity   Alcohol use: No   Drug use: No   Sexual activity: Yes  Other Topics Concern   Not on file  Social History Narrative      Lives with husband   Social Drivers of Health   Financial Resource Strain: Low Risk  (07/23/2023)   Overall Financial Resource Strain (CARDIA)    Difficulty of Paying Living Expenses: Not hard at all  Food Insecurity: No Food Insecurity (07/23/2023)   Hunger Vital Sign    Worried About Running Out of Food in the Last Year: Never true    Ran Out of Food in the Last Year: Never true  Transportation Needs: No Transportation Needs (07/23/2023)   PRAPARE - Administrator, Civil Service (Medical): No    Lack of Transportation (Non-Medical): No  Physical Activity: Inactive (07/23/2023)   Exercise Vital Sign    Days of Exercise per Week: 0 days    Minutes of Exercise per Session: 0 min  Stress: No Stress Concern Present (07/23/2023)   [employer] of Occupational Health - Occupational Stress Questionnaire    Feeling of Stress : Not at all  Social Connections: Unknown (07/23/2023)   Social Connection and Isolation Panel [NHANES]    Frequency of Communication with Friends and Family: More than three times a week    Frequency of Social Gatherings with Friends and Family: Never    Attends Religious Services: Patient declined    Database administrator or Organizations: No    Attends Engineer, structural: Never    Marital Status: Married    Tobacco Counseling Counseling given: Not Answered Tobacco comments: social age 71   Clinical Intake:  Pre-visit preparation completed: Yes  Pain : No/denies pain     BMI - recorded: 27.98 Nutritional Status: BMI 25 -29 Overweight Nutritional Risks: None Diabetes: No  How often do you need to have someone help you  when you read instructions, pamphlets, or other written materials from your doctor or pharmacy?: 1 - Never  Interpreter Needed?: No      Activities of Daily Living    07/23/2023    9:59 AM  In your present state of health, do you have any difficulty performing the following activities:  Hearing? 1  Comment some hearing loss  Vision? 0  Difficulty concentrating or making decisions? 0  Walking or climbing stairs? 1  Comment knee issues  Dressing or bathing? 0  Doing errands, shopping? 0  Preparing Food and eating ? N  Using the Toilet? N  In the past six months, have you accidently leaked urine? N  Do you have problems with loss of bowel control? N  Managing your Medications? N  Managing your Finances? N  Housekeeping or managing your Housekeeping? N    Patient Care Team: Pincus Sanes, MD as PCP - General (Internal Medicine) Lanier Prude, MD as PCP - Electrophysiology (Cardiology) Lunette Stands, MD (Orthopedic Surgery) Freddy Finner, MD (Inactive) (Obstetrics and Gynecology) Maeola Harman, MD (Neurosurgery) Enid Baas, MD (Sports Medicine) Sharrell Ku, MD (Gastroenterology) Marily Lente, NP (Inactive) as Nurse Practitioner (Cardiology) Serena Colonel, MD as Consulting Physician (Otolaryngology) Maris Berger, MD as Consulting Physician (Ophthalmology)  Indicate any recent Medical Services you may have received from other than [facility] providers in the past year (date may be approximate).     Assessment:   This is a routine wellness examination for [place].  Hearing/Vision screen Hearing Screening - Comments:: Some hearing loss Vision Screening - Comments:: Denies vision issues   Goals Addressed             This Visit's Progress    Patient Stated   On track    Continue to play golf and stay as physically active as possible. Continue to travel, enjoy life and stay social.      Depression Screen    07/23/2023   10:06 AM 01/22/2023    8:02 AM  07/25/2022    8:47 AM 01/23/2022    2:37 PM 01/18/2022    5:01 PM 11/17/2020    8:08 AM 06/20/2019   10:40 AM  PHQ 2/9 Scores  PHQ - 2 Score 0 0 0 0 0 0 0  PHQ- 9 Score 1  2 2  3      Fall Risk    07/23/2023   10:03 AM 01/22/2023    8:02 AM 07/25/2022    8:47 AM 01/23/2022    2:37 PM 01/18/2022    5:04 PM  Fall Risk   Falls in the past year? 0 0 0 0 0  Number falls in past yr: 0 0 0  0  Injury with Fall? 0 0 0  0  Risk for fall due to : No Fall Risks No Fall Risks No Fall Risks  No Fall Risks  Follow up Falls prevention discussed;Falls evaluation completed Falls evaluation completed Falls evaluation completed  Falls evaluation completed    MEDICARE RISK AT HOME: Medicare Risk at Home Any stairs in or around the home?: Yes If so, are there any without handrails?: Yes Home free of loose throw rugs in walkways, pet beds, electrical cords, etc?: Yes Adequate lighting in your home to reduce risk of falls?: Yes Life alert?: No Use of a cane, walker or w/c?: No Grab bars in the bathroom?: Yes Shower chair or bench in shower?: Yes Elevated toilet seat or a handicapped toilet?: Yes  TIMED UP AND GO:  Was the test performed?  No    Cognitive Function:    04/18/2018    8:45 AM 03/28/2016    9:53 AM  MMSE - Mini Mental State Exam  Not completed:  --  Orientation to time 5   Orientation to Place 5   Registration 3   Attention/ Calculation 5   Recall 3   Language- name 2 objects 2   Language- repeat 1   Language- follow 3 step command 3   Language- read & follow direction 1   Write a sentence 1   Copy design 1   Total score 30         07/23/2023  10:04 AM 01/18/2022    5:05 PM 06/20/2019   10:37 AM  6CIT Screen  What Year? 0 points 0 points 0 points  What month? 0 points 0 points 0 points  What time? 0 points 0 points 0 points  Count back from 20 0 points 0 points 0 points  Months in reverse 0 points 0 points 0 points  Repeat phrase 0 points 0 points 0 points   Total Score 0 points 0 points 0 points    Immunizations Immunization History  Administered Date(s) Administered   Fluad Quad(high Dose 65+) 05/31/2022   Fluad Trivalent(High Dose 65+) 05/29/2023   Influenza Split 04/07/2013   Influenza, High Dose Seasonal PF 05/16/2016, 05/15/2017, 04/12/2018, 05/10/2019, 05/18/2020   Influenza,inj,Quad PF,6+ Mos 05/03/2015   Influenza-Unspecified 05/07/2014, 06/14/2021   PFIZER Comirnaty(Gray Top)Covid-19 Tri-Sucrose Vaccine 01/28/2021   PFIZER(Purple Top)SARS-COV-2 Vaccination 08/27/2019, 09/17/2019, 05/28/2020   Pfizer(Comirnaty)Fall Seasonal Vaccine 12 years and older 06/15/2022   Pneumococcal Conjugate-13 10/14/2014   Pneumococcal Polysaccharide-23 05/15/2012   Td 12/05/2005, 08/12/2012   Zoster Recombinant(Shingrix) 10/24/2017, 01/04/2018   Zoster, Live 08/07/2006    TDAP status: Due, Education has been provided regarding the importance of this vaccine. Advised may receive this vaccine at local pharmacy or Health Dept. Aware to provide a copy of the vaccination record if obtained from local pharmacy or Health Dept. Verbalized acceptance and understanding.  Flu Vaccine status: Up to date  Pneumococcal vaccine status: Up to date  Covid-19 vaccine status: Information provided on how to obtain vaccines.   Qualifies for Shingles Vaccine? Yes   Zostavax completed Yes   Shingrix Completed?: Yes  Screening Tests Health Maintenance  Topic Date Due   DTaP/Tdap/Td (3 - Tdap) 07/27/2023 (Originally 08/12/2022)   COVID-19 Vaccine (6 - 2024-25 season) 08/07/2023 (Originally 04/08/2023)   Medicare Annual Wellness (AWV)  07/22/2024   DEXA SCAN  08/22/2027   Pneumonia Vaccine 40+ Years old  Completed   INFLUENZA VACCINE  Completed   Hepatitis C Screening  Completed   Zoster Vaccines- Shingrix  Completed   HPV VACCINES  Aged Out   Colonoscopy  Discontinued    Health Maintenance  There are no preventive care reminders to display for this  patient.   Colorectal cancer screening: Type of screening: Colonoscopy. Completed 12/06/2021. Repeat every 5 years  Mammogram status: Completed 05/29/2023. Repeat every year  Bone Density status: Completed 08/21/2022. Results reflect: Bone density results: OSTEOPENIA. Repeat every 2 years.  Lung Cancer Screening: (Low Dose CT Chest recommended if Age 80-80 years, 20 pack-year currently smoking OR have quit w/in 15years.) does not qualify.   Lung Cancer Screening Referral: N/A  Additional Screening:  Hepatitis C Screening: does qualify; Completed 03/28/2016  Vision Screening: Recommended annual ophthalmology exams for early detection of glaucoma and other disorders of the eye. Is the patient up to date with their annual eye exam?  Yes  Who is the provider or what is the name of the office in which the patient attends annual eye exams? [facility] If pt is not established with a provider, would they like to be referred to a provider to establish care? No .   Dental Screening: Recommended annual dental exams for proper oral hygiene '  Community Resource Referral / Chronic Care Management: CRR required this visit?  No   CCM required this visit?  No     Plan:     I have personally reviewed and noted the following in the patient's chart:   Medical and social history Use of  alcohol, tobacco or illicit drugs  Current medications and supplements including opioid prescriptions. Patient is not currently taking opioid prescriptions. Functional ability and status Nutritional status Physical activity Advanced directives List of other physicians Hospitalizations, surgeries, and ER visits in previous 12 months Vitals Screenings to include cognitive, depression, and falls Referrals and appointments  In addition, I have reviewed and discussed with patient certain preventive protocols, quality metrics, and best practice recommendations. A written personalized care plan for preventive  services as well as general preventive health recommendations were provided to patient.     Nina Mondor L Cordon Gassett, CMA   07/23/2023   After Visit Summary: (MyChart) Due to this being a telephonic visit, the after visit summary with patients personalized plan was offered to patient via MyChart   Nurse Notes: Patient is up to date on her health maintenance.  Patient does want to discuss getting the RSV with Dr. Lawerance Bach during her next visit with her.  She had no other concerns to address today.

## 2023-07-23 NOTE — Patient Instructions (Signed)
[name] , Thank you for taking time to come for your Medicare Wellness Visit. I appreciate your ongoing commitment to your health goals. Please review the following plan we discussed and let me know if I can assist you in the future.   Referrals/Orders/Follow-Ups/Clinician Recommendations: Keep up the good work.  [name] Christmas to you and your family.  This is a list of the screening recommended for you and due dates:  Health Maintenance  Topic Date Due   DTaP/Tdap/Td vaccine (3 - Tdap) 07/27/2023*   COVID-19 Vaccine (6 - 2024-25 season) 08/07/2023*   Medicare Annual Wellness Visit  07/22/2024   DEXA scan (bone density measurement)  08/22/2027   Pneumonia Vaccine  Completed   Flu Shot  Completed   Hepatitis C Screening  Completed   Zoster (Shingles) Vaccine  Completed   HPV Vaccine  Aged Out   Colon Cancer Screening  Discontinued  *Topic was postponed. The date shown is not the original due date.    Advanced directives: (Copy Requested) Please bring a copy of your health care power of attorney and living will to the office to be added to your chart at your convenience.  Next Medicare Annual Wellness Visit scheduled for next year: Yes

## 2023-07-24 ENCOUNTER — Encounter: Payer: Self-pay | Admitting: Internal Medicine

## 2023-07-24 NOTE — Patient Instructions (Addendum)
      Blood work was ordered.       Medications changes include :   None    A referral was ordered and someone will call you to schedule an appointment.     Return in about 6 months (around 01/23/2024) for follow up.

## 2023-07-24 NOTE — Progress Notes (Unsigned)
Subjective:    Patient ID: [name], female    DOB: [birth year], 77 y.o.   MRN: [id number]     HPI [name] is here for follow up of her chronic medical problems.  Doing well.  Just got over a 2 week cold.    Pain in right chest - intermittent - also pain in back when she leans against it.  Just had mammogram.  No n/t.  Feels it at rest or with activity.  She thinks she primarily feels it when she is in her recliner.  Sometimes changing positions will help.  No respiratory issues.  Medications and allergies reviewed with patient and updated if appropriate.  Current Outpatient Medications on File Prior to Visit  Medication Sig Dispense Refill   acetaminophen (TYLENOL) 650 MG CR tablet Take 650 mg by mouth 2 (two) times daily.      bifidobacterium infantis (ALIGN) capsule Take 1 capsule by mouth daily. 30 capsule 11   cholecalciferol (VITAMIN D) 1000 UNITS tablet Take 2,000 Units by mouth daily.     co-enzyme Q-10 50 MG capsule Take 50 mg by mouth daily.     diltiazem (CARDIZEM CD) 180 MG 24 hr capsule Take 1 capsule (180 mg total) by mouth 2 (two) times daily. 180 capsule 3   diltiazem (CARDIZEM) 30 MG tablet TAKE 1 TABLET EVERY 4 HOURS AS NEEDED FOR AFIB HEART RATE >100 30 tablet 2   hydrochlorothiazide (HYDRODIURIL) 25 MG tablet TAKE 1 TABLET (25 MG TOTAL) BY MOUTH DAILY. 90 tablet 1   KLOR-CON M10 10 MEQ tablet TAKE 1 TABLET BY MOUTH THREE TIMES A DAY 270 tablet 1   magnesium oxide (MAG-OX) 400 MG tablet Take 400 mg by mouth daily.     pravastatin (PRAVACHOL) 40 MG tablet Take 1 tablet (40 mg total) by mouth daily. 90 tablet 3   predniSONE (STERAPRED UNI-PAK 21 TAB) 10 MG (21) TBPK tablet Take by mouth.     traMADol (ULTRAM) 50 MG tablet TAKE 1 TABLET BY MOUTH EVERY 12 HOURS AS NEEDED. 180 tablet 0   No current facility-administered medications on file prior to visit.     Review of Systems  Constitutional:  Negative for fever.  Respiratory:  Negative for cough,  shortness of breath and wheezing.   Cardiovascular:  Positive for chest pain (right side of chest and right back - ?msk). Negative for palpitations and leg swelling.  Musculoskeletal:  Positive for arthralgias and back pain.  Neurological:  Negative for light-headedness and headaches.       Objective:   Vitals:   07/25/23 0809  BP: 128/74  Pulse: 78  Temp: 98 F (36.7 C)  SpO2: 98%   BP Readings from Last 3 Encounters:  07/25/23 128/74  06/21/23 132/72  01/22/23 122/78   Wt Readings from Last 3 Encounters:  07/25/23 161 lb (73 kg)  07/23/23 163 lb (73.9 kg)  06/21/23 163 lb (73.9 kg)   Body mass index is 27.64 kg/m.    Physical Exam Constitutional:      General: She is not in acute distress.    Appearance: Normal appearance.  HENT:     Head: Normocephalic and atraumatic.  Eyes:     Conjunctiva/sclera: Conjunctivae normal.  Cardiovascular:     Rate and Rhythm: Normal rate and regular rhythm.     Heart sounds: Normal heart sounds.  Pulmonary:     Effort: Pulmonary effort is normal. No respiratory distress.     Breath sounds:  Normal breath sounds. No wheezing.  Musculoskeletal:        General: No tenderness (right chest or right upper back, T-spine).     Cervical back: Neck supple.     Right lower leg: No edema.     Left lower leg: No edema.  Lymphadenopathy:     Cervical: No cervical adenopathy.  Skin:    General: Skin is warm and dry.     Findings: No rash.  Neurological:     Mental Status: She is alert. Mental status is at baseline.  Psychiatric:        Mood and Affect: Mood normal.        Behavior: Behavior normal.        Lab Results  Component Value Date   WBC 10.1 01/22/2023   HGB 14.6 01/22/2023   HCT 43.7 01/22/2023   PLT 249.0 01/22/2023   GLUCOSE 83 01/22/2023   CHOL 191 01/22/2023   TRIG 127.0 01/22/2023   HDL 59.90 01/22/2023   LDLDIRECT 87.0 01/23/2022   LDLCALC 106 (H) 01/22/2023   ALT 12 01/22/2023   AST 14 01/22/2023   NA 138  01/22/2023   K 4.4 01/22/2023   CL 103 01/22/2023   CREATININE 0.90 01/22/2023   BUN 26 (H) 01/22/2023   CO2 29 01/22/2023   TSH 1.71 11/17/2020   INR 1.4 01/25/2010   HGBA1C 5.6 01/22/2023     Assessment & Plan:    See Problem List for Assessment and Plan of chronic medical problems.

## 2023-07-25 ENCOUNTER — Encounter: Payer: Self-pay | Admitting: Internal Medicine

## 2023-07-25 ENCOUNTER — Ambulatory Visit (INDEPENDENT_AMBULATORY_CARE_PROVIDER_SITE_OTHER): Payer: Medicare Other | Admitting: Internal Medicine

## 2023-07-25 VITALS — BP 128/74 | HR 78 | Temp 98.0°F | Ht 64.0 in | Wt 161.0 lb

## 2023-07-25 DIAGNOSIS — E538 Deficiency of other specified B group vitamins: Secondary | ICD-10-CM

## 2023-07-25 DIAGNOSIS — M150 Primary generalized (osteo)arthritis: Secondary | ICD-10-CM

## 2023-07-25 DIAGNOSIS — E559 Vitamin D deficiency, unspecified: Secondary | ICD-10-CM | POA: Diagnosis not present

## 2023-07-25 DIAGNOSIS — I4821 Permanent atrial fibrillation: Secondary | ICD-10-CM

## 2023-07-25 DIAGNOSIS — E782 Mixed hyperlipidemia: Secondary | ICD-10-CM

## 2023-07-25 DIAGNOSIS — I10 Essential (primary) hypertension: Secondary | ICD-10-CM | POA: Diagnosis not present

## 2023-07-25 DIAGNOSIS — R0789 Other chest pain: Secondary | ICD-10-CM | POA: Insufficient documentation

## 2023-07-25 DIAGNOSIS — R7303 Prediabetes: Secondary | ICD-10-CM | POA: Diagnosis not present

## 2023-07-25 LAB — CBC WITH DIFFERENTIAL/PLATELET
Basophils Absolute: 0.1 10*3/uL (ref 0.0–0.1)
Basophils Relative: 1 % (ref 0.0–3.0)
Eosinophils Absolute: 0.1 10*3/uL (ref 0.0–0.7)
Eosinophils Relative: 1.1 % (ref 0.0–5.0)
HCT: 43 % (ref 36.0–46.0)
Hemoglobin: 14.4 g/dL (ref 12.0–15.0)
Lymphocytes Relative: 30.4 % (ref 12.0–46.0)
Lymphs Abs: 2.4 10*3/uL (ref 0.7–4.0)
MCHC: 33.5 g/dL (ref 30.0–36.0)
MCV: 95.6 fL (ref 78.0–100.0)
Monocytes Absolute: 0.7 10*3/uL (ref 0.1–1.0)
Monocytes Relative: 8.9 % (ref 3.0–12.0)
Neutro Abs: 4.6 10*3/uL (ref 1.4–7.7)
Neutrophils Relative %: 58.6 % (ref 43.0–77.0)
Platelets: 306 10*3/uL (ref 150.0–400.0)
RBC: 4.5 Mil/uL (ref 3.87–5.11)
RDW: 12.8 % (ref 11.5–15.5)
WBC: 7.8 10*3/uL (ref 4.0–10.5)

## 2023-07-25 LAB — LIPID PANEL
Cholesterol: 177 mg/dL (ref 0–200)
HDL: 50.6 mg/dL (ref >39.00)
LDL Cholesterol: 86 mg/dL (ref 0–99)
NonHDL: 126.69
Total CHOL/HDL Ratio: 4
Triglycerides: 204 mg/dL — ABNORMAL HIGH (ref 0.0–149.0)
VLDL: 40.8 mg/dL — ABNORMAL HIGH (ref 0.0–40.0)

## 2023-07-25 LAB — COMPREHENSIVE METABOLIC PANEL
ALT: 10 U/L (ref 0–35)
AST: 16 U/L (ref 0–37)
Albumin: 4.3 g/dL (ref 3.5–5.2)
Alkaline Phosphatase: 88 U/L (ref 39–117)
BUN: 22 mg/dL (ref 6–23)
CO2: 28 meq/L (ref 19–32)
Calcium: 9.5 mg/dL (ref 8.4–10.5)
Chloride: 103 meq/L (ref 96–112)
Creatinine, Ser: 0.82 mg/dL (ref 0.40–1.20)
GFR: 69.49 mL/min (ref >60.00)
Glucose, Bld: 87 mg/dL (ref 70–99)
Potassium: 4.2 meq/L (ref 3.5–5.1)
Sodium: 140 meq/L (ref 135–145)
Total Bilirubin: 0.5 mg/dL (ref 0.2–1.2)
Total Protein: 7.1 g/dL (ref 6.0–8.3)

## 2023-07-25 LAB — VITAMIN D 25 HYDROXY (VIT D DEFICIENCY, FRACTURES): VITD: 54.14 ng/mL (ref 30.00–100.00)

## 2023-07-25 LAB — VITAMIN B12: Vitamin B-12: 501 pg/mL (ref 211–911)

## 2023-07-25 LAB — HEMOGLOBIN A1C: Hgb A1c MFr Bld: 6.1 % (ref 4.6–6.5)

## 2023-07-25 MED ORDER — RIVAROXABAN 20 MG PO TABS: 20.0000 mg | ORAL_TABLET | Freq: Every day | ORAL | 1 refills | Status: DC

## 2023-07-25 MED ORDER — LISINOPRIL 40 MG PO TABS: 40.0000 mg | ORAL_TABLET | Freq: Every day | ORAL | 1 refills | Status: DC

## 2023-07-25 NOTE — Assessment & Plan Note (Signed)
Chronic Check level 

## 2023-07-25 NOTE — Assessment & Plan Note (Signed)
Subacute Started months ago Intermittent Right-sided chest pain and right back pain near scapula, no spine pain Can feel it with rest or activity, but primarily feels it when she is in her recliner Changing positions can cause it and relieve it No respiratory issues, mammogram normal, no GI issues-not related to GERD Likely musculoskeletal in nature Will discuss with sports medicine

## 2023-07-25 NOTE — Assessment & Plan Note (Signed)
Chronic Taking vitamin d daily Check vitamin d level  

## 2023-07-25 NOTE — Assessment & Plan Note (Signed)
Chronic Following with cardiology On Xarelto 20 mg daily, diltiazem 180 mg twice daily CBC, CMP 

## 2023-07-25 NOTE — Assessment & Plan Note (Signed)
Chronic Blood pressure well controlled CMP, cbc Continue diltiazem 180 mg twice daily, HCTZ 25 mg daily, lisinopril 40 mg daily

## 2023-07-25 NOTE — Assessment & Plan Note (Addendum)
Chronic Following with orthopedics for different joints Continue tramadol 50 mg twice daily as needed - hard to tell how much it helps

## 2023-07-25 NOTE — Assessment & Plan Note (Signed)
Chronic Regular exercise and healthy diet encouraged Check lipid panel  Continue pravastatin 40 mg daily 

## 2023-07-25 NOTE — Assessment & Plan Note (Signed)
Chronic Check a1c Low sugar / carb diet Stressed regular exercise  

## 2023-07-26 MED ORDER — TRAMADOL HCL 50 MG PO TABS: 50.0000 mg | ORAL_TABLET | Freq: Two times a day (BID) | ORAL | 1 refills | Status: DC | PRN

## 2023-07-27 DIAGNOSIS — Z124 Encounter for screening for malignant neoplasm of cervix: Secondary | ICD-10-CM | POA: Diagnosis not present

## 2023-07-27 DIAGNOSIS — Z6827 Body mass index (BMI) 27.0-27.9, adult: Secondary | ICD-10-CM | POA: Diagnosis not present

## 2023-07-27 DIAGNOSIS — N819 Female genital prolapse, unspecified: Secondary | ICD-10-CM | POA: Diagnosis not present

## 2023-07-27 DIAGNOSIS — Z1151 Encounter for screening for human papillomavirus (HPV): Secondary | ICD-10-CM | POA: Diagnosis not present

## 2023-08-09 ENCOUNTER — Encounter: Payer: Self-pay | Admitting: Internal Medicine

## 2023-08-13 DIAGNOSIS — N819 Female genital prolapse, unspecified: Secondary | ICD-10-CM | POA: Diagnosis not present

## 2023-08-13 DIAGNOSIS — R413 Other amnesia: Secondary | ICD-10-CM | POA: Diagnosis not present

## 2023-08-14 ENCOUNTER — Encounter: Payer: Self-pay | Admitting: Physician Assistant

## 2023-09-30 NOTE — Progress Notes (Signed)
 Assessment/Plan:     [name] is a very pleasant 78 y.o. year old RH female with a history of hypertension, hyperlipidemia, prediabetes, permanent atrial fibrillation on Xarelto, seen today for evaluation of memory loss. MoCA today is 23/ 30 .  Etiology is unclear at this time, workup is in progress although there is concern for memory impairment due to vascular disease.  She is able to participate on her ADLs, travels extensively, and able to drive.  Discussed initiating memantine 5 mg twice daily in an effort to slow down any worsening of memory, she agrees to proceed.  (Patient has severe diarrhea episodes due to IBS)  Memory Impairment  MRI brain without contrast to assess for underlying structural abnormality and assess vascular load  Neurocognitive testing to further evaluate cognitive concerns and determine other underlying cause of memory changes, including potential contribution from sleep, anxiety, attention, or depression among others Start memantine 5 mg twice daily as directed, side effects discussed Check TSH  recommend good control of cardiovascular risk factors.  Continue Xarelto, follow-up with cardiology. Continue to control mood as per PCP Recommend checking hearing to increase comprehension Agree with psychotherapy for anxiety and depression Folllow up in 3 months   Subjective:    The patient is here alone    How long did patient have memory difficulties?  For several years, worse over the last year.  Patient reports some difficulty remembering new information, recent conversations, names. "I know the word but cannot retrieve it". I try to avoid conversations because I cannot remember details, so I diverted to my husband to let him continue the conversation for me". "I was at my Gyn, 3 times I could not think of the word colonoscopy".  Sometimes she reports "talking faster than I can think at which time she will cough just to mask the situation ".  STM And LTM  are affected. She likes doing Associate Professor. She needs to write a list on the bathroom mirror so that she remembers what to do during the day. Repeats oneself?  Endorsed Disoriented when walking into a room?  Patient denies   Leaving objects in unusual places?  Denies.   Wandering behavior? Denies.   Any personality changes, or depression, anxiety? She has noted more anxiety, but "depression runs in the family, may feel down at times, but not depressed" Hallucinations or paranoia? denies   Seizures? denies    Any sleep changes?  Sleeps well, but has to get up 3 times to urinate. Denies vivid dreams, REM behavior or sleepwalking   Sleep apnea? Denies.   Any hygiene concerns?  Denies.   Independent of bathing and dressing? Endorsed , but due to hand arthritis so se needs help tying the shoes. Does the patient need help with medications?  Patient is in charge, she has difficulty with the night dose.    Who is in charge of the finances?  Husband is in charge     Any changes in appetite?   Denies."I am always hungry, sugar craving".      Patient have trouble swallowing?  Denies.   Does the patient cook? Yes, denies forgetting recipes. No kitchen accidents Any headaches?  Denies.   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 Denies  Recent falls or head injuries? Denies.     Vision changes?  Denies any new issues.  Had cataract surgery in the past, needs glasses.  Any strokelike symptoms? Denies.  Any tremors? Denies.   Any anosmia? Denies.   Any incontinence of urine Endorsed  Any bowel dysfunction? She has possibly IBS, with diarrhea    Patient lives with her husband, she travels extensively in [place] History of heavy alcohol intake? Denies.   History of heavy tobacco use? Denies.   Family history of dementia?  Denies   Does patient drive? Yes, sometimes she uses GPS, denies any issue   Recent labs December 2024, A1c 6.1 normal CMP,  TC 204 with rest of lipid panel normal, vitamin D 54, B12 501, CBC normal  Allergies  Allergen Reactions   Nickel Rash    Current Outpatient Medications  Medication Instructions   acetaminophen (TYLENOL) 650 mg, 2 times daily   bifidobacterium infantis (ALIGN) capsule 1 capsule, Daily   cholecalciferol (VITAMIN D) 2,000 Units, Daily   co-enzyme Q-10 50 mg, Daily   diazepam (VALIUM) 2 MG tablet Take 1 tab 30 minutes prior to MRI, may add an additional tab if needed   diltiazem (CARDIZEM CD) 180 mg, Oral, 2 times daily   diltiazem (CARDIZEM) 30 MG tablet TAKE 1 TABLET EVERY 4 HOURS AS NEEDED FOR AFIB HEART RATE >100   hydrochlorothiazide (HYDRODIURIL) 25 mg, Oral, Daily   KLOR-CON M10 10 MEQ tablet TAKE 1 TABLET BY MOUTH THREE TIMES A DAY   lisinopril (ZESTRIL) 40 mg, Oral, Daily   magnesium oxide (MAG-OX) 400 mg, Daily   memantine (NAMENDA) 5 MG tablet Take 1 tablet (5 mg at night) for 2 weeks, then increase to 1 tablet (5 mg) twice a day   pravastatin (PRAVACHOL) 40 mg, Oral, Daily   predniSONE (STERAPRED UNI-PAK 21 TAB) 10 MG (21) TBPK tablet Take by mouth.   rivaroxaban (XARELTO) 20 mg, Oral, Daily with supper   traMADol (ULTRAM) 50 mg, Oral, Every 12 hours PRN     VITALS:   Vitals:   10/03/23 0938  BP: (!) 118/57  Pulse: 66  Resp: 20  SpO2: 98%  Weight: 166 lb (75.3 kg)  Height: 5\' 4"  (1.626 m)      PHYSICAL EXAM   HEENT:  Normocephalic, atraumatic.  The superficial temporal arteries are without ropiness or tenderness. Cardiovascular: Irregular rate and rhythm. Lungs: Clear to auscultation bilaterally. Neck: There are no carotid bruits noted bilaterally.  NEUROLOGICAL:    10/03/2023   10:00 AM  Montreal Cognitive Assessment   Visuospatial/ Executive (0/5) 3  Naming (0/3) 3  Attention: Read list of digits (0/2) 2  Attention: Read list of letters (0/1) 1  Attention: Serial 7 subtraction starting at 100 (0/3) 3  Language: Repeat phrase (0/2) 1  Language :  Fluency (0/1) 0  Abstraction (0/2) 1  Delayed Recall (0/5) 3  Orientation (0/6) 6  Total 23  Adjusted Score (based on education) 23       04/18/2018    8:45 AM 03/28/2016    9:53 AM  MMSE - Mini Mental State Exam  Not completed:  --  Orientation to time 5   Orientation to Place 5   Registration 3   Attention/ Calculation 5   Recall 3   Language- name 2 objects 2   Language- repeat 1   Language- follow 3 step command 3   Language- read & follow direction 1   Write a sentence 1   Copy design 1   Total score 30      Orientation:  Alert and oriented to person, place and  time. No aphasia or dysarthria. Fund of knowledge is  appropriate. Recent memory impaired, remote memory normal.  Attention and concentration are reduced .  Able to name objects and unable to repeat longer phrases.  Delayed recall 3/5 Cranial nerves: There is good facial symmetry. Extraocular muscles are intact and visual fields are full to confrontational testing. Speech is fluent and clear although at times she may be hesitant. No tongue deviation. Hearing is mildly reduced  to conversational tone.  Tone: Tone is good throughout. Sensation: Sensation is intact to light touch.  Vibration is intact at the bilateral big toe.  Coordination: The patient has no difficulty with RAM's or FNF bilaterally. Normal finger to nose  Motor: Strength is 5/5 in the bilateral upper and lower extremities. There is no pronator drift. There are no fasciculations noted. DTR's: Deep tendon reflexes are 2/4 bilaterally. Gait and Station: The patient is able to ambulate without difficulty. Gait is cautious and narrow. Stride length is normal       Thank you for allowing [place] the opportunity to participate in the care of this nice patient. Please do not hesitate to contact us for any questions or concerns.   Total time spent on today's visit was 60 minutes dedicated to this patient today, preparing to see patient, examining the patient,  ordering tests and/or medications and counseling the patient, documenting clinical information in the EHR or other health record, independently interpreting results and communicating results to the patient/family, discussing treatment and goals, answering patient's questions and coordinating care.  Cc:  Pincus Sanes, MD  Marlowe Kays 10/03/2023 11:16 AM

## 2023-10-03 ENCOUNTER — Encounter: Payer: Self-pay | Admitting: Physician Assistant

## 2023-10-03 ENCOUNTER — Ambulatory Visit (INDEPENDENT_AMBULATORY_CARE_PROVIDER_SITE_OTHER): Payer: Medicare Other | Admitting: Physician Assistant

## 2023-10-03 ENCOUNTER — Other Ambulatory Visit: Payer: Medicare Other

## 2023-10-03 ENCOUNTER — Ambulatory Visit: Payer: Medicare Other

## 2023-10-03 VITALS — BP 118/57 | HR 66 | Resp 20 | Ht 64.0 in | Wt 166.0 lb

## 2023-10-03 DIAGNOSIS — R413 Other amnesia: Secondary | ICD-10-CM | POA: Insufficient documentation

## 2023-10-03 LAB — TSH: TSH: 1.42 m[IU]/L (ref 0.40–4.50)

## 2023-10-03 MED ORDER — MEMANTINE HCL 5 MG PO TABS: ORAL_TABLET | ORAL | 3 refills | Status: DC

## 2023-10-03 MED ORDER — DIAZEPAM 2 MG PO TABS: ORAL_TABLET | ORAL | 0 refills | Status: DC

## 2023-10-03 NOTE — Patient Instructions (Addendum)
 It was a pleasure to see you today at our office.   Recommendations:  Neurocognitive evaluation at our office   MRI of the brain, the radiology office will call you to arrange you appointment   Start Memantine 5mg  tablets.  Take 1 tablet at bedtime for 2 weeks, then 1 tablet twice daily.   Check labs today    Recommend checking hearing to increase comprehension Agree with psychotherapy for anxiety and depression Follow up in 3 months    For psychiatric meds, mood meds: Please have your primary care physician manage these medications.  If you have any severe symptoms of a stroke, or other severe issues such as confusion,severe chills or fever, etc call 911 or go to the ER as you may need to be evaluated further    For assessment of decision of mental capacity and competency:  Call Dr. Erick Blinks, geriatric psychiatrist at [phone]  Counseling regarding caregiver distress, including caregiver depression, anxiety and issues regarding community resources, adult day care programs, adult living facilities, or memory care questions:  please contact your  Primary Doctor's Social Worker   Whom to call: Memory  decline, memory medications: Call our office [phone]    [email or web address]   RECOMMENDATIONS FOR ALL PATIENTS WITH MEMORY PROBLEMS: 1. Continue to exercise (Recommend 30 minutes of walking everyday, or 3 hours every week) 2. Increase social interactions - continue going to [place] and enjoy social gatherings with friends and family 3. Eat healthy, avoid fried foods and eat more fruits and vegetables 4. Maintain adequate blood pressure, blood sugar, and blood cholesterol level. Reducing the risk of stroke and cardiovascular disease also helps promoting better memory. 5. Avoid stressful situations. Live a simple life and avoid aggravations. Organize your time and prepare for the next day in anticipation. 6. Sleep well,  avoid any interruptions of sleep and avoid any distractions in the bedroom that may interfere with adequate sleep quality 7. Avoid sugar, avoid sweets as there is a strong link between excessive sugar intake, diabetes, and cognitive impairment We discussed the Mediterranean diet, which has been shown to help patients reduce the risk of progressive memory disorders and reduces cardiovascular risk. This includes eating fish, eat fruits and green leafy vegetables, nuts like almonds and hazelnuts, walnuts, and also use olive oil. Avoid fast foods and fried foods as much as possible. Avoid sweets and sugar as sugar use has been linked to worsening of memory function.  There is always a concern of gradual progression of memory problems. If this is the case, then we may need to adjust level of care according to patient needs. Support, both to the patient and caregiver, should then be put into place.      You have been referred for a neuropsychological evaluation (i.e., evaluation of memory and thinking abilities). Please bring someone with you to this appointment if possible, as it is helpful for the doctor to hear from both you and another adult who knows you well. Please bring eyeglasses and hearing aids if you wear them.    The evaluation will take approximately 3 hours and has two parts:   The first part is a clinical interview with the neuropsychologist (Dr. Milbert Coulter or Dr. Robbie Lis). During the interview, the neuropsychologist will speak with you and the individual you brought to the appointment.    The second part of the evaluation is testing with the doctor's technician Annabelle Harman or Selena Batten). During the testing, the technician will ask you to remember different types of  material, solve problems, and answer some questionnaires. Your family member will not be present for this portion of the evaluation.   Please note: We must reserve several hours of the neuropsychologist's time and the psychometrician's time for  your evaluation appointment. As such, there is a No-Show fee of $100. If you are unable to attend any of your appointments, please contact our office as soon as possible to reschedule.      DRIVING: Regarding driving, in patients with progressive memory problems, driving will be impaired. We advise to have someone else do the driving if trouble finding directions or if minor accidents are reported. Independent driving assessment is available to determine safety of driving.   If you are interested in the driving assessment, you can contact the following:  [employer] in [place] [phone]  [facility] [phone]  [facility] [phone]  [employer] [phone] or [phone]   FALL PRECAUTIONS: Be cautious when walking. Scan the area for obstacles that may increase the risk of trips and falls. When getting up in the mornings, sit up at the edge of the bed for a few minutes before getting out of bed. Consider elevating the bed at the head end to avoid drop of blood pressure when getting up. Walk always in a well-lit room (use night lights in the walls). Avoid area rugs or power cords from appliances in the middle of the walkways. Use a walker or a cane if necessary and consider physical therapy for balance exercise. Get your eyesight checked regularly.  FINANCIAL OVERSIGHT: Supervision, especially oversight when making financial decisions or transactions is also recommended.  HOME SAFETY: Consider the safety of the kitchen when operating appliances like stoves, microwave oven, and blender. Consider having supervision and share cooking responsibilities until no longer able to participate in those. Accidents with firearms and other hazards in the house should be identified and addressed as well.   ABILITY TO BE LEFT ALONE: If patient is unable to contact 911 operator, consider using LifeLine, or when the need is there, arrange for  someone to stay with patients. Smoking is a fire hazard, consider supervision or cessation. Risk of wandering should be assessed by caregiver and if detected at any point, supervision and safe proof recommendations should be instituted.  MEDICATION SUPERVISION: Inability to self-administer medication needs to be constantly addressed. Implement a mechanism to ensure safe administration of the medications.      Mediterranean Diet A Mediterranean diet refers to food and lifestyle choices that are based on the traditions of countries located on the [employer] [place]. This way of eating has been shown to help prevent certain conditions and improve outcomes for people who have chronic diseases, like kidney disease and heart disease. What are tips for following this plan? Lifestyle  Cook and eat meals together with your family, when possible. Drink enough fluid to keep your urine clear or pale yellow. Be physically active every day. This includes: Aerobic exercise like running or swimming. Leisure activities like gardening, walking, or housework. Get 7-8 hours of sleep each night. If recommended by your health care provider, drink red wine in moderation. This means 1 glass a day for nonpregnant women and 2 glasses a day for men. A glass of wine equals 5 oz (150 mL). Reading food labels  Check the serving size of packaged foods. For foods such as rice and pasta, the serving size refers to the amount of cooked product, not dry. Check the total fat in packaged  foods. Avoid foods that have saturated fat or trans fats. Check the ingredients list for added sugars, such as corn syrup. Shopping  At the grocery store, buy most of your food from the areas near the walls of the store. This includes: Fresh fruits and vegetables (produce). Grains, beans, nuts, and seeds. Some of these may be available in unpackaged forms or large amounts (in bulk). Fresh seafood. Poultry and eggs. Low-fat dairy  products. Buy whole ingredients instead of prepackaged foods. Buy fresh fruits and vegetables in-season from local farmers markets. Buy frozen fruits and vegetables in resealable bags. If you do not have access to quality fresh seafood, buy precooked frozen shrimp or canned fish, such as tuna, salmon, or sardines. Buy small amounts of raw or cooked vegetables, salads, or olives from the deli or salad bar at your store. Stock your pantry so you always have certain foods on hand, such as olive oil, canned tuna, canned tomatoes, rice, pasta, and beans. Cooking  Cook foods with extra-virgin olive oil instead of using butter or other vegetable oils. Have meat as a side dish, and have vegetables or grains as your main dish. This means having meat in small portions or adding small amounts of meat to foods like pasta or stew. Use beans or vegetables instead of meat in common dishes like chili or lasagna. Experiment with different cooking methods. Try roasting or broiling vegetables instead of steaming or sauteing them. Add frozen vegetables to soups, stews, pasta, or rice. Add nuts or seeds for added healthy fat at each meal. You can add these to yogurt, salads, or vegetable dishes. Marinate fish or vegetables using olive oil, lemon juice, garlic, and fresh herbs. Meal planning  Plan to eat 1 vegetarian meal one day each week. Try to work up to 2 vegetarian meals, if possible. Eat seafood 2 or more times a week. Have healthy snacks readily available, such as: Vegetable sticks with hummus. Greek yogurt. Fruit and nut trail mix. Eat balanced meals throughout the week. This includes: Fruit: 2-3 servings a day Vegetables: 4-5 servings a day Low-fat dairy: 2 servings a day Fish, poultry, or lean meat: 1 serving a day Beans and legumes: 2 or more servings a week Nuts and seeds: 1-2 servings a day Whole grains: 6-8 servings a day Extra-virgin olive oil: 3-4 servings a day Limit red meat and sweets  to only a few servings a month What are my food choices? Mediterranean diet Recommended Grains: Whole-grain pasta. Brown rice. Bulgar wheat. Polenta. Couscous. Whole-wheat bread. Orpah Cobb. Vegetables: Artichokes. Beets. Broccoli. Cabbage. Carrots. Eggplant. Green beans. Chard. Kale. Spinach. Onions. Leeks. Peas. Squash. Tomatoes. Peppers. Radishes. Fruits: Apples. Apricots. Avocado. Berries. Bananas. Cherries. Dates. Figs. Grapes. Lemons. Melon. Oranges. Peaches. Plums. Pomegranate. Meats and other protein foods: Beans. Almonds. Sunflower seeds. Pine nuts. Peanuts. Cod. Salmon. Scallops. Shrimp. Tuna. Tilapia. Clams. Oysters. Eggs. Dairy: Low-fat milk. Cheese. Greek yogurt. Beverages: Water. Red wine. Herbal tea. Fats and oils: Extra virgin olive oil. Avocado oil. Grape seed oil. Sweets and desserts: [place] yogurt with honey. Baked apples. Poached pears. Trail mix. Seasoning and other foods: Basil. Cilantro. Coriander. Cumin. Mint. Parsley. Sage. Rosemary. Tarragon. Garlic. Oregano. Thyme. Pepper. Balsalmic vinegar. Tahini. Hummus. Tomato sauce. Olives. Mushrooms. Limit these Grains: Prepackaged pasta or rice dishes. Prepackaged cereal with added sugar. Vegetables: Deep fried potatoes (french fries). Fruits: Fruit canned in syrup. Meats and other protein foods: Beef. Pork. Lamb. Poultry with skin. Hot dogs. Tomasa Blase. Dairy: Ice cream. Sour cream. Whole milk. Beverages: Juice.  Sugar-sweetened soft drinks. Beer. Liquor and spirits. Fats and oils: Butter. Canola oil. Vegetable oil. Beef fat (tallow). Lard. Sweets and desserts: Cookies. Cakes. Pies. Candy. Seasoning and other foods: Mayonnaise. Premade sauces and marinades. The items listed may not be a complete list. Talk with your dietitian about what dietary choices are right for you. Summary The Mediterranean diet includes both food and lifestyle choices. Eat a variety of fresh fruits and vegetables, beans, nuts, seeds, and whole  grains. Limit the amount of red meat and sweets that you eat. Talk with your health care provider about whether it is safe for you to drink red wine in moderation. This means 1 glass a day for nonpregnant women and 2 glasses a day for men. A glass of wine equals 5 oz (150 mL). This information is not intended to replace advice given to you by your health care provider. Make sure you discuss any questions you have with your health care provider. Document Released: 03/16/2016 Document Revised: 04/18/2016 Document Reviewed: 03/16/2016 Elsevier Interactive Patient Education  2017 [employer].

## 2023-10-04 NOTE — Progress Notes (Signed)
 Thyroid levels are normal, thank you

## 2023-10-05 ENCOUNTER — Telehealth: Payer: Self-pay | Admitting: Physician Assistant

## 2023-10-05 DIAGNOSIS — R413 Other amnesia: Secondary | ICD-10-CM

## 2023-10-05 NOTE — Telephone Encounter (Signed)
 Pt. Would like MRI at [facility] instead of [place], xfrd to [place]

## 2023-10-05 NOTE — Telephone Encounter (Signed)
 I sent order to [facility].

## 2023-10-10 ENCOUNTER — Encounter: Payer: Self-pay | Admitting: Internal Medicine

## 2023-10-10 NOTE — Progress Notes (Signed)
 Subjective:    Patient ID: [name], female    DOB: 01-03-1946, 78 y.o.   MRN: [id number]      HPI [name] is here for  Chief Complaint  Patient presents with   Anxiety    Is having a brain MRI for memory issues, would like to discuss anxiety medications     Memory has been bad.  Husband thinks she has a hearing problem - to have a test for hearing.   Thinks uterus is falling - discussed with gyn - has appt for that.    Discussed memory with gyn.  Some difficulty recalling words.  That is making her nervous.  She saw sara wertman.  [facility] was 23/30.  Referred for MRI brain and neuropsych.    This is making her nervous.    She feels depressed.    Would stop tramadol - not sure if it is really helping.    Medications and allergies reviewed with patient and updated if appropriate.  Current Outpatient Medications on File Prior to Visit  Medication Sig Dispense Refill   acetaminophen (TYLENOL) 650 MG CR tablet Take 650 mg by mouth 2 (two) times daily.      bifidobacterium infantis (ALIGN) capsule Take 1 capsule by mouth daily. 30 capsule 11   cholecalciferol (VITAMIN D) 1000 UNITS tablet Take 2,000 Units by mouth daily.     co-enzyme Q-10 50 MG capsule Take 50 mg by mouth daily.     diltiazem (CARDIZEM CD) 180 MG 24 hr capsule Take 1 capsule (180 mg total) by mouth 2 (two) times daily. 180 capsule 3   diltiazem (CARDIZEM) 30 MG tablet TAKE 1 TABLET EVERY 4 HOURS AS NEEDED FOR AFIB HEART RATE >100 30 tablet 2   hydrochlorothiazide (HYDRODIURIL) 25 MG tablet TAKE 1 TABLET (25 MG TOTAL) BY MOUTH DAILY. 90 tablet 1   KLOR-CON M10 10 MEQ tablet TAKE 1 TABLET BY MOUTH THREE TIMES A DAY 270 tablet 1   lisinopril (ZESTRIL) 40 MG tablet Take 1 tablet (40 mg total) by mouth daily. 90 tablet 1   magnesium oxide (MAG-OX) 400 MG tablet Take 400 mg by mouth daily.     memantine (NAMENDA) 5 MG tablet Take 1 tablet (5 mg at night) for 2 weeks, then increase to 1 tablet (5 mg) twice a  day 180 tablet 3   pravastatin (PRAVACHOL) 40 MG tablet Take 1 tablet (40 mg total) by mouth daily. 90 tablet 3   rivaroxaban (XARELTO) 20 MG TABS tablet Take 1 tablet (20 mg total) by mouth daily with supper. 90 tablet 1   No current facility-administered medications on file prior to visit.    Review of Systems     Objective:   Vitals:   10/11/23 0941  BP: 128/70  Pulse: 86  Temp: 97.8 F (36.6 C)  SpO2: 95%   BP Readings from Last 3 Encounters:  10/11/23 128/70  10/03/23 (!) 118/57  07/25/23 128/74   Wt Readings from Last 3 Encounters:  10/11/23 164 lb (74.4 kg)  10/03/23 166 lb (75.3 kg)  07/25/23 161 lb (73 kg)   Body mass index is 28.15 kg/m.    Physical Exam Constitutional:      General: She is not in acute distress.    Appearance: Normal appearance. She is not ill-appearing.  HENT:     Head: Normocephalic and atraumatic.  Skin:    General: Skin is warm and dry.  Neurological:     Mental Status: She  is alert. Mental status is at baseline.  Psychiatric:        Mood and Affect: Mood normal.        Behavior: Behavior normal.        Thought Content: Thought content normal.        Judgment: Judgment normal.            Assessment & Plan:    See Problem List for Assessment and Plan of chronic medical problems.

## 2023-10-10 NOTE — Patient Instructions (Incomplete)
   Medications changes include :   fluoxetine 20 mg daily,  xanax 0.5 mg twice daily as needed for panic attack.  Buspar 5 mg at night as needed for sleep.

## 2023-10-11 ENCOUNTER — Ambulatory Visit (INDEPENDENT_AMBULATORY_CARE_PROVIDER_SITE_OTHER): Payer: Medicare Other | Admitting: Internal Medicine

## 2023-10-11 ENCOUNTER — Encounter: Payer: Self-pay | Admitting: Internal Medicine

## 2023-10-11 VITALS — BP 128/70 | HR 86 | Temp 97.8°F | Ht 64.0 in | Wt 164.0 lb

## 2023-10-11 DIAGNOSIS — F419 Anxiety disorder, unspecified: Secondary | ICD-10-CM | POA: Diagnosis not present

## 2023-10-11 DIAGNOSIS — M150 Primary generalized (osteo)arthritis: Secondary | ICD-10-CM

## 2023-10-11 MED ORDER — DULOXETINE HCL 30 MG PO CPEP: 30.0000 mg | ORAL_CAPSULE | Freq: Every day | ORAL | 1 refills | Status: DC

## 2023-10-11 MED ORDER — HYDROCODONE-ACETAMINOPHEN 5-325 MG PO TABS: 1.0000 | ORAL_TABLET | Freq: Four times a day (QID) | ORAL | 0 refills | Status: AC | PRN

## 2023-10-13 NOTE — Assessment & Plan Note (Signed)
 Chronic Following with orthopedics for different joints Stop tramadol - not sure how much this helps and will be starting an SSRI Will give her a small supply of vicodin to use prn only for severe pain when very active -- but will also be taking prednisone prn per sports medicine which will help more

## 2023-10-13 NOTE — Assessment & Plan Note (Signed)
 New Having increased anxiety This may be impacting her memory as well Start cymbalta 30 mg daily - hopefully this will also help her chronic OA pain Stop tramadol

## 2023-10-15 DIAGNOSIS — S76011A Strain of muscle, fascia and tendon of right hip, initial encounter: Secondary | ICD-10-CM | POA: Diagnosis not present

## 2023-10-15 DIAGNOSIS — Z7901 Long term (current) use of anticoagulants: Secondary | ICD-10-CM | POA: Diagnosis not present

## 2023-10-15 DIAGNOSIS — Z87891 Personal history of nicotine dependence: Secondary | ICD-10-CM | POA: Diagnosis not present

## 2023-10-15 DIAGNOSIS — X58XXXA Exposure to other specified factors, initial encounter: Secondary | ICD-10-CM | POA: Diagnosis not present

## 2023-10-15 DIAGNOSIS — I482 Chronic atrial fibrillation, unspecified: Secondary | ICD-10-CM | POA: Diagnosis not present

## 2023-10-18 ENCOUNTER — Institutional Professional Consult (permissible substitution): Payer: Medicare Other | Admitting: Psychology

## 2023-10-18 ENCOUNTER — Ambulatory Visit: Payer: Self-pay

## 2023-10-19 DIAGNOSIS — M9906 Segmental and somatic dysfunction of lower extremity: Secondary | ICD-10-CM | POA: Diagnosis not present

## 2023-10-19 DIAGNOSIS — M9902 Segmental and somatic dysfunction of thoracic region: Secondary | ICD-10-CM | POA: Diagnosis not present

## 2023-10-19 DIAGNOSIS — R41840 Attention and concentration deficit: Secondary | ICD-10-CM | POA: Diagnosis not present

## 2023-10-19 DIAGNOSIS — M9904 Segmental and somatic dysfunction of sacral region: Secondary | ICD-10-CM | POA: Diagnosis not present

## 2023-10-19 DIAGNOSIS — M9905 Segmental and somatic dysfunction of pelvic region: Secondary | ICD-10-CM | POA: Diagnosis not present

## 2023-10-19 DIAGNOSIS — M9907 Segmental and somatic dysfunction of upper extremity: Secondary | ICD-10-CM | POA: Diagnosis not present

## 2023-10-19 DIAGNOSIS — M9903 Segmental and somatic dysfunction of lumbar region: Secondary | ICD-10-CM | POA: Diagnosis not present

## 2023-10-19 DIAGNOSIS — R413 Other amnesia: Secondary | ICD-10-CM | POA: Diagnosis not present

## 2023-10-19 DIAGNOSIS — M25551 Pain in right hip: Secondary | ICD-10-CM | POA: Diagnosis not present

## 2023-10-19 DIAGNOSIS — M5459 Other low back pain: Secondary | ICD-10-CM | POA: Diagnosis not present

## 2023-10-19 DIAGNOSIS — M9908 Segmental and somatic dysfunction of rib cage: Secondary | ICD-10-CM | POA: Diagnosis not present

## 2023-10-26 ENCOUNTER — Other Ambulatory Visit: Payer: Self-pay | Admitting: Internal Medicine

## 2023-10-26 ENCOUNTER — Encounter: Payer: Medicare Other | Admitting: Psychology

## 2023-10-29 ENCOUNTER — Ambulatory Visit: Payer: Self-pay

## 2023-10-29 ENCOUNTER — Institutional Professional Consult (permissible substitution): Payer: Medicare Other | Admitting: Psychology

## 2023-11-02 ENCOUNTER — Other Ambulatory Visit: Payer: Self-pay

## 2023-11-02 ENCOUNTER — Emergency Department (HOSPITAL_BASED_OUTPATIENT_CLINIC_OR_DEPARTMENT_OTHER): Admission: EM | Admit: 2023-11-02 | Discharge: 2023-11-02 | Disposition: A | Discharge status: Home / Self Care

## 2023-11-02 ENCOUNTER — Emergency Department (HOSPITAL_BASED_OUTPATIENT_CLINIC_OR_DEPARTMENT_OTHER): Admitting: Radiology

## 2023-11-02 ENCOUNTER — Encounter (HOSPITAL_BASED_OUTPATIENT_CLINIC_OR_DEPARTMENT_OTHER): Payer: Self-pay | Admitting: Emergency Medicine

## 2023-11-02 DIAGNOSIS — Z79899 Other long term (current) drug therapy: Secondary | ICD-10-CM | POA: Insufficient documentation

## 2023-11-02 DIAGNOSIS — M25552 Pain in left hip: Secondary | ICD-10-CM | POA: Diagnosis not present

## 2023-11-02 DIAGNOSIS — Z7901 Long term (current) use of anticoagulants: Secondary | ICD-10-CM | POA: Diagnosis not present

## 2023-11-02 DIAGNOSIS — I10 Essential (primary) hypertension: Secondary | ICD-10-CM | POA: Diagnosis not present

## 2023-11-02 MED ORDER — LIDOCAINE 5 % EX PTCH: 1.0000 | MEDICATED_PATCH | CUTANEOUS | 0 refills | Status: DC

## 2023-11-02 MED ORDER — KETOROLAC TROMETHAMINE 15 MG/ML IJ SOLN
15.0000 mg | Freq: Once | INTRAMUSCULAR | Status: AC
  Administered 2023-11-02: 15 mg via INTRAMUSCULAR
  Filled 2023-11-02: qty 1

## 2023-11-02 MED ORDER — TIZANIDINE HCL 4 MG PO TABS: 4.0000 mg | ORAL_TABLET | Freq: Four times a day (QID) | ORAL | 0 refills | Status: DC | PRN

## 2023-11-02 NOTE — Discharge Instructions (Addendum)
 It was a pleasure taking part in your care.  As discussed, the x-ray images today were negative for any kind of acute process.  You could be having worsening of your osteoarthritis.  Please continue taking Tylenol at home every 6 hours.  Please take muscle relaxers as needed for pain twice a day.  Please be aware the muscle relaxers will cause you to become lightheaded and dizzy so do not drive or operate heavy machinery on this medication.  You may also use Salonpas patches sent to your pharmacy.  Follow-up with your orthopedic doctor.  Return to the ED with any new or worsening symptoms.

## 2023-11-02 NOTE — ED Triage Notes (Signed)
 Pt via pov from home with left hip pain x 2 days. Pt denies any known injury. Pt recently had episode with right hip pain and was put on steroids. Pt alert & oriented, nad noted.

## 2023-11-02 NOTE — ED Provider Notes (Signed)
 Day EMERGENCY DEPARTMENT AT [facility] Provider Note   CSN: [id number] Arrival date & time: 11/02/23  1015     History  Chief Complaint  Patient presents with   Hip Pain    [name] is a 78 y.o. female with medical history of some trochanteric bursitis, hypertension, bilateral hip arthritis, atrial fibrillation on Xarelto.  Patient presents to ED for evaluation of 2 days of left hip pain.  Denies any known injury, trauma to account for the pain.  States that she recently was seen for right hip pain and was placed on steroids.  Reports that she has taken 2 doses of prednisone for her hip pain which will "calm it down" but hip pain was returns.  She has not taken ibuprofen or Tylenol.  Denies any overlying skin change, fevers at home.  Denies any pain in knee or low back.  Denies any other concerns.   Hip Pain       Home Medications Prior to Admission medications   Medication Sig Start Date End Date Taking? Authorizing Provider  lidocaine (LIDODERM) 5 % Place 1 patch onto the skin daily. Remove & Discard patch within 12 hours or as directed by MD 11/02/23  Yes Al Decant, PA-C  tiZANidine (ZANAFLEX) 4 MG tablet Take 1 tablet (4 mg total) by mouth every 6 (six) hours as needed for muscle spasms. 11/02/23  Yes Al Decant, PA-C  acetaminophen (TYLENOL) 650 MG CR tablet Take 650 mg by mouth 2 (two) times daily.     [provider]  bifidobacterium infantis (ALIGN) capsule Take 1 capsule by mouth daily. 10/14/14   Newt Lukes, MD  cholecalciferol (VITAMIN D) 1000 UNITS tablet Take 2,000 Units by mouth daily.    [provider]  co-enzyme Q-10 50 MG capsule Take 50 mg by mouth daily.    [provider]  diltiazem (CARDIZEM CD) 180 MG 24 hr capsule TAKE 1 CAPSULE BY MOUTH TWICE A DAY 10/26/23   Burns, Bobette Mo, MD  diltiazem (CARDIZEM) 30 MG tablet TAKE 1 TABLET EVERY 4 HOURS AS NEEDED FOR AFIB HEART RATE >100 01/22/23    Pincus Sanes, MD  DULoxetine (CYMBALTA) 30 MG capsule Take 1 capsule (30 mg total) by mouth daily. 10/11/23   Pincus Sanes, MD  hydrochlorothiazide (HYDRODIURIL) 25 MG tablet TAKE 1 TABLET (25 MG TOTAL) BY MOUTH DAILY. 10/26/23   Pincus Sanes, MD  HYDROcodone-acetaminophen (NORCO/VICODIN) 5-325 MG tablet Take 1 tablet by mouth every 6 (six) hours as needed for severe pain (pain score 7-10). 10/11/23   Burns, Bobette Mo, MD  KLOR-CON M10 10 MEQ tablet TAKE 1 TABLET BY MOUTH THREE TIMES A DAY 10/26/23   Pincus Sanes, MD  lisinopril (ZESTRIL) 40 MG tablet Take 1 tablet (40 mg total) by mouth daily. 07/25/23   Pincus Sanes, MD  magnesium oxide (MAG-OX) 400 MG tablet Take 400 mg by mouth daily.    [provider]  memantine (NAMENDA) 5 MG tablet Take 1 tablet (5 mg at night) for 2 weeks, then increase to 1 tablet (5 mg) twice a day 10/03/23   Marcos Eke, PA-C  pravastatin (PRAVACHOL) 40 MG tablet Take 1 tablet (40 mg total) by mouth daily. 01/16/23   Pincus Sanes, MD  rivaroxaban (XARELTO) 20 MG TABS tablet Take 1 tablet (20 mg total) by mouth daily with supper. 07/25/23   Pincus Sanes, MD      Allergies  Nickel    Review of Systems   Review of Systems  Musculoskeletal:  Positive for arthralgias.  All other systems reviewed and are negative.   Physical Exam Updated Vital Signs BP (!) 150/80 (BP Location: Right Arm)   Pulse 99   Temp 98.1 F (36.7 C)   Resp 18   Ht 5\' 4"  (1.626 m)   Wt 74.8 kg   SpO2 100%   BMI 28.31 kg/m  Physical Exam Vitals and nursing note reviewed.  Constitutional:      General: She is not in acute distress.    Appearance: She is well-developed.  HENT:     Head: Normocephalic and atraumatic.  Eyes:     Conjunctiva/sclera: Conjunctivae normal.  Cardiovascular:     Rate and Rhythm: Normal rate and regular rhythm.     Heart sounds: No murmur heard. Pulmonary:     Effort: Pulmonary effort is normal. No respiratory distress.     Breath  sounds: Normal breath sounds.  Abdominal:     Palpations: Abdomen is soft.     Tenderness: There is no abdominal tenderness.  Musculoskeletal:        General: No swelling.     Cervical back: Neck supple.     Right hip: Normal.     Left hip: Tenderness present. No deformity or lacerations. Normal range of motion.     Comments: Patient with tenderness to lateral portion of hip.  No overlying skin change.  No shortening or rotation of left lower extremity.  She has full range of motion of her hip.  She can bear weight on the left lower extremity.  Skin:    General: Skin is warm and dry.     Capillary Refill: Capillary refill takes less than 2 seconds.  Neurological:     Mental Status: She is alert.  Psychiatric:        Mood and Affect: Mood normal.     ED Results / Procedures / Treatments   Labs (all labs ordered are listed, but only abnormal results are displayed) Labs Reviewed - No data to display  EKG None  Radiology DG Hip Unilat W or Wo Pelvis 2-3 Views Left Result Date: 11/02/2023 CLINICAL DATA:  Left hip pain EXAM: DG HIP (WITH OR WITHOUT PELVIS) 2-3V LEFT COMPARISON:  01/23/2021 FINDINGS: There is no evidence of hip fracture or dislocation. There is no evidence of significant arthropathy or other focal bone abnormality. IMPRESSION: Negative. Electronically Signed   By: Duanne Guess D.O.   On: 11/02/2023 12:55    Procedures Procedures   Medications Ordered in ED Medications  ketorolac (TORADOL) 15 MG/ML injection 15 mg (15 mg Intramuscular Given 11/02/23 1139)    ED Course/ Medical Decision Making/ A&P  Medical Decision Making Amount and/or Complexity of Data Reviewed Radiology: ordered.  Risk Prescription drug management.   78 year old female presents for evaluation.  Please see HPI for further details.  On examination the patient is afebrile and nontachycardic.  Her lung sounds are clear bilaterally, she is nonhypoxic.  Abdomen is soft and compressible.   Neurological examinations at baseline.  Her left hip has lateral tenderness.  There is no overlying skin change, no deformity, no shortening or rotation of her left lower extremity.  She has full range of motion actively and passively of her left hip.  There is no pain with ranging.  She can bear weight onto her left lower extremity without problem.  Chart review reveals that the patient has chronic OA, has  OA in many joints.  I suspect the patient could be having worsening of her OA.  Will collect x-ray imaging and reassess.  Will provide patient Toradol.  X-ray imaging negative for any kind of acute process.  Patient reports her left hip pain has slightly decreased.  Will discharge patient home and have her follow-up with orthopedic doctor.  Will send her home with muscle relaxers.  She takes Xarelto for A-fib so will advise her to continue taking Tylenol.   Final Clinical Impression(s) / ED Diagnoses Final diagnoses:  Left hip pain    Rx / DC Orders ED Discharge Orders          Ordered    tiZANidine (ZANAFLEX) 4 MG tablet  Every 6 hours PRN        11/02/23 1308    lidocaine (LIDODERM) 5 %  Every 24 hours        11/02/23 1308              Al Decant, [place] 11/02/23 1308    Durwin Glaze, MD 11/02/23 [phone]

## 2023-11-07 ENCOUNTER — Ambulatory Visit
Admission: RE | Admit: 2023-11-07 | Discharge: 2023-11-07 | Disposition: A | Discharge status: Home / Self Care | Source: Ambulatory Visit | Attending: Physician Assistant | Admitting: Physician Assistant

## 2023-11-07 DIAGNOSIS — R413 Other amnesia: Secondary | ICD-10-CM | POA: Diagnosis not present

## 2023-11-08 DIAGNOSIS — M25552 Pain in left hip: Secondary | ICD-10-CM | POA: Diagnosis not present

## 2023-11-08 DIAGNOSIS — M170 Bilateral primary osteoarthritis of knee: Secondary | ICD-10-CM | POA: Diagnosis not present

## 2023-11-08 DIAGNOSIS — R262 Difficulty in walking, not elsewhere classified: Secondary | ICD-10-CM | POA: Diagnosis not present

## 2023-11-08 DIAGNOSIS — M9904 Segmental and somatic dysfunction of sacral region: Secondary | ICD-10-CM | POA: Diagnosis not present

## 2023-11-08 DIAGNOSIS — M25551 Pain in right hip: Secondary | ICD-10-CM | POA: Diagnosis not present

## 2023-11-08 DIAGNOSIS — M160 Bilateral primary osteoarthritis of hip: Secondary | ICD-10-CM | POA: Diagnosis not present

## 2023-11-08 DIAGNOSIS — M9905 Segmental and somatic dysfunction of pelvic region: Secondary | ICD-10-CM | POA: Diagnosis not present

## 2023-11-08 DIAGNOSIS — M9906 Segmental and somatic dysfunction of lower extremity: Secondary | ICD-10-CM | POA: Diagnosis not present

## 2023-11-13 NOTE — Progress Notes (Signed)
The MRI of the brain  showed mild hardening of the small blood vessels in the brain in patients with high cholesterol, blood pressure or sugar control issues, sometimes age as well. It did not show any tumors, stroke or bleed.Thanks

## 2023-11-20 ENCOUNTER — Telehealth (HOSPITAL_BASED_OUTPATIENT_CLINIC_OR_DEPARTMENT_OTHER): Payer: Self-pay | Admitting: Physical Therapy

## 2023-11-20 NOTE — Telephone Encounter (Signed)
 Called and spoke to patient to remind patient of upcoming physical therapy evaluation appointment. Pt confirmed appt and will be in attendance.

## 2023-11-22 ENCOUNTER — Encounter (HOSPITAL_BASED_OUTPATIENT_CLINIC_OR_DEPARTMENT_OTHER): Payer: Self-pay | Admitting: Physical Therapy

## 2023-11-22 ENCOUNTER — Other Ambulatory Visit: Payer: Self-pay

## 2023-11-22 ENCOUNTER — Ambulatory Visit (HOSPITAL_BASED_OUTPATIENT_CLINIC_OR_DEPARTMENT_OTHER): Payer: Self-pay | Attending: Sports Medicine | Admitting: Physical Therapy

## 2023-11-22 DIAGNOSIS — M25562 Pain in left knee: Secondary | ICD-10-CM | POA: Diagnosis not present

## 2023-11-22 DIAGNOSIS — M25551 Pain in right hip: Secondary | ICD-10-CM | POA: Diagnosis not present

## 2023-11-22 DIAGNOSIS — R2689 Other abnormalities of gait and mobility: Secondary | ICD-10-CM | POA: Insufficient documentation

## 2023-11-22 DIAGNOSIS — M25561 Pain in right knee: Secondary | ICD-10-CM | POA: Insufficient documentation

## 2023-11-22 DIAGNOSIS — H906 Mixed conductive and sensorineural hearing loss, bilateral: Secondary | ICD-10-CM | POA: Diagnosis not present

## 2023-11-22 DIAGNOSIS — M25552 Pain in left hip: Secondary | ICD-10-CM | POA: Insufficient documentation

## 2023-11-22 DIAGNOSIS — H903 Sensorineural hearing loss, bilateral: Secondary | ICD-10-CM | POA: Diagnosis not present

## 2023-11-22 DIAGNOSIS — R29898 Other symptoms and signs involving the musculoskeletal system: Secondary | ICD-10-CM | POA: Insufficient documentation

## 2023-11-22 DIAGNOSIS — M6281 Muscle weakness (generalized): Secondary | ICD-10-CM | POA: Insufficient documentation

## 2023-11-22 NOTE — Therapy (Signed)
 OUTPATIENT PHYSICAL THERAPY LOWER EXTREMITY EVALUATION   Patient Name: [name] MRN: [id number] DOB:January 23, 1946, 78 y.o., female Today's Date: 11/22/2023  END OF SESSION:  PT End of Session - 11/22/23 0933     Visit Number 1    Number of Visits 16    Date for PT Re-Evaluation 01/17/24    Authorization Type MEDICARE    Progress Note Due on Visit 10    PT Start Time 0934    PT Stop Time 1009    PT Time Calculation (min) 35 min    Activity Tolerance Patient tolerated treatment well    Behavior During Therapy WFL for tasks assessed/performed             Past Medical History:  Diagnosis Date   ARTHRITIS, HIPS, BILATERAL    Atrial fibrillation (HCC)    chronic anticoag - Pradaxa   BURSITIS, SUBTROCHANTERIC    HYPERTENSION    MITRAL VALVE PROLAPSE    Mixed hyperlipidemia    Past Surgical History:  Procedure Laterality Date   DILATION AND CURETTAGE OF UTERUS     TONSILLECTOMY     Patient Active Problem List   Diagnosis Date Noted   Anxiety 10/11/2023   Memory impairment 10/03/2023   Intermittent right-sided chest pain 07/25/2023   Pruritus - legs 01/22/2023   Vitamin D deficiency 01/22/2022   Secondary hypercoagulable state (HCC) 11/17/2020   Nasal lesion 11/08/2020   B12 deficiency 10/28/2019   IBS (irritable bowel syndrome) 10/28/2019   Arthritis of right hand 07/22/2019   Osteoarthritis of hand 06/05/2019   Presbycusis of both ears 05/14/2018   ETD (Eustachian tube dysfunction), bilateral 04/12/2018   Prediabetes 05/15/2017   Pain of right heel 01/09/2017   Primary osteoarthritis of both feet 07/10/2016   Primary osteoarthritis of both hands 07/10/2016   Alopecia totalis 06/05/2016   Dermatochalasis of both upper eyelids 10/12/2015   H/O dysplastic nevus 08/03/2015   Degenerative lumbar disc 12/04/2013   Hematuria, undiagnosed cause 09/02/2013   Osteopenia 06/11/2013   Primary generalized (osteo)arthritis 12/18/2011   Mixed hyperlipidemia  03/25/2009   Essential hypertension 03/24/2009   ATRIAL FIBRILLATION 03/24/2009   Greater trochanteric bursitis of left hip 02/12/2007    PCP: Colene Dauphin, MD  REFERRING PROVIDER: Clyde Darling, DO  REFERRING DIAG: [phone] (ICD-10-CM) - Pain in left hip R26.2 (ICD-10-CM) - Difficulty in walking, not elsewhere classified Bilateral OA of hip, bilateral OA of knee, pain in R hip, pain in L hip  THERAPY DIAG:  Bilateral hip pain  Pain in both knees, unspecified chronicity  Muscle weakness (generalized)  Other abnormalities of gait and mobility  Other symptoms and signs involving the musculoskeletal system  Rationale for Evaluation and Treatment: Rehabilitation  ONSET DATE: chronic   SUBJECTIVE:   SUBJECTIVE STATEMENT: Patient states she just joined the gym a few weeks ago. Then had a problem with her hip. Ruptured disc in back twice. Patient states hip pain for a long time. Shots helped a little in R. R hip is worse than the left. On way to [place]  she had a pain in R glute where she had to go to ER. L hip has some pain as well. Symptoms are worse with prolonged walking. Wants to able to workout again, eager to get stronger. Had weak abductors in the past. Legs are very weak and has trouble with stairs. L knee bothers her and troubles with squats and lunges.  PERTINENT HISTORY: hypertension, bilateral hip arthritis, atrial fibrillation  PAIN:  Are you having pain? No at rest  PRECAUTIONS: None  WEIGHT BEARING RESTRICTIONS: No  FALLS:  Has patient fallen in last 6 months? No  PLOF: Independent  PATIENT GOALS: get stronger, exercise   OBJECTIVE: (objective measures from initial evaluation unless otherwise dated)  PATIENT SURVEYS:  LEFS 31/80  COGNITION: Overall cognitive status: Within functional limits for tasks assessed     SENSATION: WFL  POSTURE: No Significant postural limitations  PALPATION: Grossly tender bilateral glutes  LOWER EXTREMITY  ROM:  Active ROM Right eval Left eval  Hip flexion    Hip extension    Hip abduction    Hip adduction    Hip internal rotation    Hip external rotation    Knee flexion    Knee extension    Ankle dorsiflexion    Ankle plantarflexion    Ankle inversion    Ankle eversion     (Blank rows = not tested) *= pain/symptoms  LOWER EXTREMITY MMT:  MMT Right eval Left eval  Hip flexion 4 4-  Hip extension 3 3  Hip abduction 3- 3  Hip adduction    Hip internal rotation    Hip external rotation    Knee flexion 4+ 4+  Knee extension 4+ 4+  Ankle dorsiflexion    Ankle plantarflexion    Ankle inversion    Ankle eversion     (Blank rows = not tested) *= pain/symptoms    FUNCTIONAL TESTS:  5 times sit to stand: 31.35 seconds with hands on thighs, labored, relies L>RLE, R valgus Stairs: alternating pattern, 7 inch steps, unilateral HHA, L knee pain, decreased concentric and eccentric strength and motor control bilaterally, increased valgus on R  GAIT: Distance walked: 100 feet Assistive device utilized: None Level of assistance: Complete Independence Comments: R truncal lean on R stance, decreased hip extension bilaterally   TODAY'S TREATMENT:                                                                                                                              DATE:  11/22/23 Bridge 2 x 10 Clam 2 x 10    PATIENT EDUCATION:  Education details: Patient educated on exam findings, POC, scope of PT, HEP, relevant anatomy and pathology. Person educated: Patient Education method: Explanation, Demonstration, and Handouts Education comprehension: verbalized understanding, returned demonstration, verbal cues required, and tactile cues required  HOME EXERCISE PROGRAM: Access Code: [email or web address] URL: [email or web address] Date: 11/22/2023 Prepared by: Debria Fang Melady Chow  Exercises - Supine Bridge  - 1 x daily - 7 x weekly - 2 sets - 10 reps - Clamshell  - 1 x daily  - 7 x weekly - 2 sets - 10 reps  ASSESSMENT:  CLINICAL IMPRESSION: Patient a 78 y.o. y.o. female who was seen today for physical therapy evaluation and treatment for bilateral hip and knee pain. Patient presents with pain limited deficits in bilateral hip and knee strength, ROM, endurance, activity tolerance, and functional mobility  with ADL. Patient is having to modify and restrict ADL as indicated by outcome measure score as well as subjective information and objective measures which is affecting overall participation. Patient will benefit from skilled physical therapy in order to improve function and reduce impairment.  OBJECTIVE IMPAIRMENTS: Abnormal gait, decreased activity tolerance, decreased balance, decreased endurance, decreased mobility, difficulty walking, decreased ROM, decreased strength, increased muscle spasms, impaired flexibility, improper body mechanics, and pain.   ACTIVITY LIMITATIONS: carrying, lifting, bending, standing, squatting, stairs, transfers, locomotion level, and caring for others  PARTICIPATION LIMITATIONS: meal prep, cleaning, laundry, shopping, community activity, and yard work  PERSONAL FACTORS: Time since onset of injury/illness/exacerbation and 3+ comorbidities: HTN, afib, hx hip and knee pain, hx back pain  are also affecting patient's functional outcome.   REHAB POTENTIAL: Good  CLINICAL DECISION MAKING: Evolving/moderate complexity  EVALUATION COMPLEXITY: Moderate   GOALS: Goals reviewed with patient? Yes  SHORT TERM GOALS: Target date: 12/20/2023    Patient will be independent with HEP in order to improve functional outcomes. Baseline: Goal status: INITIAL  2.  Patient will report at least 25% improvement in symptoms for improved quality of life. Baseline: Goal status: INITIAL    LONG TERM GOALS: Target date: 01/17/2024    Patient will report at least 75% improvement in symptoms for improved quality of life. Baseline:  Goal status:  INITIAL  2.  Patient will improve LEFS score by at least 9 points in order to indicate improved tolerance to activity. Baseline: 31/80 Goal status: INITIAL  3.  Patient will be able to navigate stairs with reciprocal pattern without compensation in order to demonstrate improved LE strength. Baseline:  Goal status: INITIAL  4.  Patient will demonstrate grade of 5/5 MMT grade in all tested musculature as evidence of improved strength to assist with stair ambulation and gait.  Baseline:  Goal status: INITIAL  5.  Patient will be able to complete 5x STS in under 15 seconds in order to demo improved functional strength. Baseline:  Goal status: INITIAL  6.  Patient will be able to perform full gym routine independently.  Baseline:  Goal status: INITIAL   PLAN:  PT FREQUENCY: 2x/week  PT DURATION: 8 weeks  PLANNED INTERVENTIONS: [id number]- PT Re-evaluation, 97110-Therapeutic exercises, 97530- Therapeutic activity, 97112- Neuromuscular re-education, 97535- Self Care, 16109- Manual therapy, [phone]- Gait training, [phone]- Orthotic Fit/training, [phone]- Canalith repositioning, [id number]- Aquatic Therapy, [phone]- Splinting, Patient/Family education, Balance training, Stair training, Taping, Dry Needling, Joint mobilization, Joint manipulation, Spinal manipulation, Spinal mobilization, Scar mobilization, and DME instructions.  PLAN FOR NEXT SESSION: f/u with HEP, glute and quad strength, functional strength, progress to gym routine as able    Beather Liming, PT, DPT 11/22/2023, 10:11 AM

## 2023-11-25 ENCOUNTER — Encounter (HOSPITAL_BASED_OUTPATIENT_CLINIC_OR_DEPARTMENT_OTHER): Payer: Self-pay | Admitting: Emergency Medicine

## 2023-11-25 ENCOUNTER — Other Ambulatory Visit: Payer: Self-pay

## 2023-11-25 ENCOUNTER — Emergency Department (HOSPITAL_BASED_OUTPATIENT_CLINIC_OR_DEPARTMENT_OTHER)

## 2023-11-25 ENCOUNTER — Emergency Department (HOSPITAL_BASED_OUTPATIENT_CLINIC_OR_DEPARTMENT_OTHER): Admitting: Radiology

## 2023-11-25 ENCOUNTER — Emergency Department (HOSPITAL_BASED_OUTPATIENT_CLINIC_OR_DEPARTMENT_OTHER)
Admission: EM | Admit: 2023-11-25 | Discharge: 2023-11-25 | Disposition: A | Discharge status: Home / Self Care | Attending: Emergency Medicine | Admitting: Emergency Medicine

## 2023-11-25 DIAGNOSIS — W01198A Fall on same level from slipping, tripping and stumbling with subsequent striking against other object, initial encounter: Secondary | ICD-10-CM | POA: Diagnosis not present

## 2023-11-25 DIAGNOSIS — W19XXXA Unspecified fall, initial encounter: Secondary | ICD-10-CM

## 2023-11-25 DIAGNOSIS — S63501A Unspecified sprain of right wrist, initial encounter: Secondary | ICD-10-CM

## 2023-11-25 DIAGNOSIS — S80211A Abrasion, right knee, initial encounter: Secondary | ICD-10-CM | POA: Insufficient documentation

## 2023-11-25 DIAGNOSIS — S06330A Contusion and laceration of cerebrum, unspecified, without loss of consciousness, initial encounter: Secondary | ICD-10-CM | POA: Diagnosis not present

## 2023-11-25 DIAGNOSIS — S0990XA Unspecified injury of head, initial encounter: Secondary | ICD-10-CM | POA: Diagnosis not present

## 2023-11-25 DIAGNOSIS — E041 Nontoxic single thyroid nodule: Secondary | ICD-10-CM | POA: Diagnosis not present

## 2023-11-25 DIAGNOSIS — M4802 Spinal stenosis, cervical region: Secondary | ICD-10-CM | POA: Diagnosis not present

## 2023-11-25 DIAGNOSIS — S0181XA Laceration without foreign body of other part of head, initial encounter: Secondary | ICD-10-CM | POA: Diagnosis not present

## 2023-11-25 DIAGNOSIS — Z7901 Long term (current) use of anticoagulants: Secondary | ICD-10-CM | POA: Diagnosis not present

## 2023-11-25 DIAGNOSIS — M47812 Spondylosis without myelopathy or radiculopathy, cervical region: Secondary | ICD-10-CM | POA: Diagnosis not present

## 2023-11-25 DIAGNOSIS — I6523 Occlusion and stenosis of bilateral carotid arteries: Secondary | ICD-10-CM | POA: Diagnosis not present

## 2023-11-25 DIAGNOSIS — M25531 Pain in right wrist: Secondary | ICD-10-CM | POA: Diagnosis not present

## 2023-11-25 DIAGNOSIS — M8580 Other specified disorders of bone density and structure, unspecified site: Secondary | ICD-10-CM | POA: Diagnosis not present

## 2023-11-25 DIAGNOSIS — M19031 Primary osteoarthritis, right wrist: Secondary | ICD-10-CM | POA: Diagnosis not present

## 2023-11-25 DIAGNOSIS — S199XXA Unspecified injury of neck, initial encounter: Secondary | ICD-10-CM | POA: Diagnosis not present

## 2023-11-25 NOTE — ED Triage Notes (Addendum)
 Fall on xarelto  Walking to porch and trip down 1 step No LOC Denies neck pain  Lac over right eye and swelling noted to area  Discussed with provider, imaging orders place

## 2023-11-25 NOTE — ED Provider Notes (Signed)
 [place] EMERGENCY DEPARTMENT AT [facility] Provider Note   CSN: [id number] Arrival date & time: 11/25/23  1702     History {Add pertinent medical, surgical, social history, OB history to HPI:1} No chief complaint on file.   [name] is a 78 y.o. female.  She is brought in by family for evaluation of injuries from a fall.  She has a history of A-fib and is on anticoagulation.  She said she tripped on the door ledge going in the house fell and hit her head.  No loss of consciousness.  Has some pain in her right forehead above her eye along with pain in her right wrist and thumb from the fall.  Also has some abrasions on her right knee and left great toe.  No neck pain no chest or abdominal pain no numbness or weakness.  No blurry vision double vision.  Tetanus is up-to-date  The history is provided by the patient and the spouse.  Head Injury Location:  Frontal Time since incident:  1 hour Mechanism of injury: fall   Fall:    Fall occurred:  Walking   Impact surface:  Hard floor   Point of impact:  Head   Entrapped after fall: no   Pain details:    Timing:  Constant   Progression:  Unchanged Chronicity:  New Relieved by:  None tried Worsened by:  Nothing Ineffective treatments:  None tried Associated symptoms: no blurred vision, no double vision, no focal weakness, no loss of consciousness, no nausea, no neck pain, no numbness and no vomiting        Home Medications Prior to Admission medications   Medication Sig Start Date End Date Taking? Authorizing Provider  acetaminophen  (TYLENOL ) 650 MG CR tablet Take 650 mg by mouth 2 (two) times daily.     [provider]  bifidobacterium infantis (ALIGN) capsule Take 1 capsule by mouth daily. 10/14/14   Carolene Chute, MD  cholecalciferol (VITAMIN D ) 1000 UNITS tablet Take 2,000 Units by mouth daily.    [provider]  co-enzyme Q-10 50 MG capsule Take 50 mg by mouth daily.    [provider]  diltiazem  (CARDIZEM  CD) 180 MG 24 hr capsule TAKE 1 CAPSULE BY MOUTH TWICE A DAY 10/26/23   Burns, Beckey Bourgeois, MD  diltiazem  (CARDIZEM ) 30 MG tablet TAKE 1 TABLET EVERY 4 HOURS AS NEEDED FOR AFIB HEART RATE >100 01/22/23   Colene Dauphin, MD  DULoxetine  (CYMBALTA ) 30 MG capsule Take 1 capsule (30 mg total) by mouth daily. 10/11/23   Colene Dauphin, MD  hydrochlorothiazide  (HYDRODIURIL ) 25 MG tablet TAKE 1 TABLET (25 MG TOTAL) BY MOUTH DAILY. 10/26/23   Colene Dauphin, MD  HYDROcodone -acetaminophen  (NORCO/VICODIN) 5-325 MG tablet Take 1 tablet by mouth every 6 (six) hours as needed for severe pain (pain score 7-10). 10/11/23   Colene Dauphin, MD  KLOR-CON  M10 10 MEQ tablet TAKE 1 TABLET BY MOUTH THREE TIMES A DAY 10/26/23   Colene Dauphin, MD  lidocaine  (LIDODERM ) 5 % Place 1 patch onto the skin daily. Remove & Discard patch within 12 hours or as directed by MD 11/02/23   Adel Aden, PA-C  lisinopril  (ZESTRIL ) 40 MG tablet Take 1 tablet (40 mg total) by mouth daily. 07/25/23   Colene Dauphin, MD  magnesium oxide (MAG-OX) 400 MG tablet Take 400 mg by mouth daily.    [provider]  memantine  (NAMENDA ) 5 MG tablet Take 1  tablet (5 mg at night) for 2 weeks, then increase to 1 tablet (5 mg) twice a day 10/03/23   Wertman, Sara E, PA-C  pravastatin  (PRAVACHOL ) 40 MG tablet Take 1 tablet (40 mg total) by mouth daily. 01/16/23   Colene Dauphin, MD  rivaroxaban  (XARELTO ) 20 MG TABS tablet Take 1 tablet (20 mg total) by mouth daily with supper. 07/25/23   Colene Dauphin, MD  tiZANidine  (ZANAFLEX ) 4 MG tablet Take 1 tablet (4 mg total) by mouth every 6 (six) hours as needed for muscle spasms. 11/02/23   Adel Aden, PA-C      Allergies    Nickel    Review of Systems   Review of Systems  Eyes:  Negative for blurred vision and double vision.  Gastrointestinal:  Negative for nausea and vomiting.  Musculoskeletal:  Negative for neck pain.  Neurological:  Negative for focal  weakness, loss of consciousness and numbness.    Physical Exam Updated Vital Signs BP (!) 142/77   Pulse 88   Temp 97.8 F (36.6 C)   Resp 16   SpO2 100%  Physical Exam Vitals and nursing note reviewed.  Constitutional:      General: She is not in acute distress.    Appearance: Normal appearance. She is well-developed.  HENT:     Head: Normocephalic.     Comments: She has a hematoma of her right forehead above her eye.  There is a 2.5 cm laceration.  No facial tenderness otherwise. Eyes:     Conjunctiva/sclera: Conjunctivae normal.  Cardiovascular:     Rate and Rhythm: Normal rate.     Heart sounds: No murmur heard. Pulmonary:     Effort: Pulmonary effort is normal. No respiratory distress.     Breath sounds: Normal breath sounds. No stridor. No wheezing.  Abdominal:     Palpations: Abdomen is soft.     Tenderness: There is no abdominal tenderness. There is no guarding or rebound.  Musculoskeletal:        General: Tenderness and signs of injury present. No deformity. Normal range of motion.     Cervical back: Neck supple.     Comments: She has some tenderness of her right wrist at the snuffbox area and also the base of the right thumb.  Full range of motion.  No open wounds.  Few abrasions over her right knee.  Skin:    General: Skin is warm and dry.  Neurological:     General: No focal deficit present.     Mental Status: She is alert.     GCS: GCS eye subscore is 4. GCS verbal subscore is 5. GCS motor subscore is 6.     Sensory: No sensory deficit.     Motor: No weakness.     ED Results / Procedures / Treatments   Labs (all labs ordered are listed, but only abnormal results are displayed) Labs Reviewed - No data to display  EKG None  Radiology No results found.  Procedures .Laceration Repair  Date/Time: 11/25/2023 7:10 PM  Performed by: Tonya Fredrickson, MD Authorized by: Tonya Fredrickson, MD   Consent:    Consent obtained:  Verbal   Consent given  by:  Patient   Risks, benefits, and alternatives were discussed: yes     Risks discussed:  Infection, nerve damage, poor wound healing, pain, retained foreign body, tendon damage and vascular damage   Alternatives discussed:  No treatment, delayed treatment and referral Universal protocol:  Procedure explained and questions answered to patient or proxy's satisfaction: yes     Patient identity confirmed:  Verbally with patient Anesthesia:    Anesthesia method:  None Laceration details:    Location:  Face   Face location:  Forehead   Length (cm):  3 Treatment:    Area cleansed with:  Saline   Amount of cleaning:  Standard   Irrigation solution:  Sterile saline Skin repair:    Repair method:  Tissue adhesive Approximation:    Approximation:  Close Repair type:    Repair type:  Simple Post-procedure details:    Dressing:  Open (no dressing)   Procedure completion:  Tolerated well, no immediate complications   {Document cardiac monitor, telemetry assessment procedure when appropriate:1}  Medications Ordered in ED Medications - No data to display  ED Course/ Medical Decision Making/ A&P Clinical Course as of 11/25/23 1910  Sun Nov 25, 2023  1909 Patient CT head and cervical spine did not show any acute traumatic findings other than soft tissue swelling.  Her wrist x-ray showed degenerative changes.  The hand x-ray has not been read yet but looks like she has significant degenerative changes at the base of her thumb which she is aware of. [MB]  1910 I did inform the patient of the incidental finding of thyroid  nodule on her CT.  Recommended outpatient follow-up with PCP for that.  Wound was closed with Dermabond. [MB]    Clinical Course User Index [MB] Tonya Fredrickson, MD   {   Click here for ABCD2, HEART and other calculatorsREFRESH Note before signing :1}                              Medical Decision Making Amount and/or Complexity of Data Reviewed Radiology:  ordered.   This patient complains of ***; this involves an extensive number of treatment Options and is a complaint that carries with it a high risk of complications and morbidity. The differential includes ***  I ordered, reviewed and interpreted labs, which included *** I ordered medication *** and reviewed PMP when indicated. I ordered imaging studies which included *** and I independently    visualized and interpreted imaging which showed *** Additional history obtained from *** Previous records obtained and reviewed *** I consulted *** and discussed lab and imaging findings and discussed disposition.  Cardiac monitoring reviewed, *** Social determinants considered, *** Critical Interventions: ***  After the interventions stated above, I reevaluated the patient and found *** Admission and further testing considered, ***   {Document critical care time when appropriate:1} {Document review of labs and clinical decision tools ie heart score, Chads2Vasc2 etc:1}  {Document your independent review of radiology images, and any outside records:1} {Document your discussion with family members, caretakers, and with consultants:1} {Document social determinants of health affecting pt's care:1} {Document your decision making why or why not admission, treatments were needed:1} Final Clinical Impression(s) / ED Diagnoses Final diagnoses:  None    Rx / DC Orders ED Discharge Orders     None

## 2023-11-25 NOTE — Discharge Instructions (Addendum)
 You are seen in the emergency department for evaluation of injuries from a fall.  CAT scan and head and neck did not show any significant traumatic findings other than soft tissue swelling on your forehead.  He did have a thyroid  nodule that will need follow-up.  Your laceration was closed with tissue adhesive and this will dissolve over a week or 2.  Your right wrist and hand x-ray did not show any obvious fracture.  Please use splint for comfort as needed.  Tylenol  for pain.  Follow-up with your primary care doctor.

## 2023-11-26 ENCOUNTER — Encounter: Payer: Self-pay | Admitting: Internal Medicine

## 2023-11-27 ENCOUNTER — Ambulatory Visit: Payer: Self-pay

## 2023-11-27 ENCOUNTER — Institutional Professional Consult (permissible substitution): Payer: Medicare Other | Admitting: Psychology

## 2023-11-27 NOTE — Progress Notes (Deleted)
 New Patient Evaluation and Consultation  Referring Provider: Ashby Lawman, MD PCP: Colene Dauphin, MD Date of Service: 11/28/2023  SUBJECTIVE Chief Complaint: No chief complaint on file.  History of Present Illness: [name] is a 78 y.o. White or Caucasian female seen in consultation at the request of Dr Avis Boehringer for evaluation of stage III pelvic organ prolapse.    Uses pessary, concerns with future management due to arthritis in her hands History of hematuria Memory impairment ED evaluation 11/25/23 for fall with head injury, facial laceration and thyroid  nodule Started PT 11/22/23 *** estradiol tablet  ***Review of records significant for: ***bilateral hip arthritis, A. Fib on Pradaxa , coccygeal pain, pre-DM, secondary hypercoagulable state*** with intermittent R sided chest pain  Urinary Symptoms: Does not leak urine.  Leaks *** time(s) per {days/wks/mos/yrs:310907}.  Pad use: {NUMBERS 1-10:18281} {pad option:24752} per day.   Patient {ACTION; IS/IS ZOX:09604540} bothered by UI symptoms.  Day time voids 9-12.  Nocturia: 2-3 times per night to void. Voiding dysfunction:  does not empty bladder well.  Patient does not use a catheter to empty bladder.  When urinating, patient feels a weak stream Drinks: ***oz water per day  UTIs: {NUMBERS 1-10:18281} UTI's in the last year.   {ACTIONS;DENIES/REPORTS:21021675::"Denies"} history of kidney or bladder stones, pyelonephritis, bladder cancer, and kidney cancer Blood in urine *** No results found for the last 90 days.   Pelvic Organ Prolapse Symptoms:                  Patient Admits to a feeling of a bulge the vaginal area. It has been present for {NUMBER 1-10:22536} months.  Patient Denies seeing a bulge.  This bulge is bothersome.  Bowel Symptom: Bowel movements: *** time(s) per {Time; day/week/month:13537} with IBS Stool consistency: hard Straining: no.  Splinting: no.  Incomplete evacuation: no.  Patient  Admits to accidental bowel leakage / fecal incontinence  Occurs: *** time(s) per {Time; day/week/month:13537}  Consistency with leakage: liquid Bowel regimen: {bowel regimen:24759} Last colonoscopy: Results ***polyps HM Colonoscopy          Completed or No Longer Recommended     Colonoscopy  Discontinued      Frequency changed to Never automatically (Topic No Longer Applies)   12/06/2021  HM Colonoscopy component of HM COLONOSCOPY   Only the first 1 history entries have been loaded, but more history exists.                Sexual Function Sexually active: yes.  Sexual orientation: Straight Pain with sex: No  Pelvic Pain Denies pelvic pain  Past Medical History:  Past Medical History:  Diagnosis Date   ARTHRITIS, HIPS, BILATERAL    Atrial fibrillation (HCC)    chronic anticoag - Pradaxa    BURSITIS, SUBTROCHANTERIC    HYPERTENSION    MITRAL VALVE PROLAPSE    Mixed hyperlipidemia      Past Surgical History:   Past Surgical History:  Procedure Laterality Date   DILATION AND CURETTAGE OF UTERUS     TONSILLECTOMY       Past OB/GYN History: OB History  No obstetric history on file.    Vaginal deliveries: ***,  Forceps/ Vacuum deliveries: ***, Cesarean section: *** Menopausal: Yes, at age ***, Denies vaginal bleeding since menopause, history of hysteroscopy D&C in 06/20/21 for *** Contraception: s/p menopause. Last pap smear was 07/27/23***.  Any history of abnormal pap smears: no. No results found for: "DIAGPAP", "HPVHIGH", "ADEQPAP"  Medications: Patient has a current medication list which  includes the following prescription(s): acetaminophen , bifidobacterium infantis, cholecalciferol, co-enzyme q-10, diltiazem , diltiazem , duloxetine , hydrochlorothiazide , hydrocodone -acetaminophen , klor-con  m10, lidocaine , lisinopril , magnesium oxide, memantine , pravastatin , rivaroxaban , and tizanidine .   Allergies: Patient is allergic to nickel.   Social History:   Social History   Tobacco Use   Smoking status: Former    Types: Cigarettes   Smokeless tobacco: Never   Tobacco comments:    social age 69  Vaping Use   Vaping status: Never Used  Substance Use Topics   Alcohol use: No   Drug use: No    Relationship status: married Patient lives with her spouse.   Patient is not employed. Regular exercise: Yes: *** History of abuse: No  Family History:   Family History  Problem Relation Age of Onset   Hypertension Mother    Heart failure Mother    Leukemia Father      Review of Systems: Review of Systems  Constitutional:  Positive for malaise/fatigue. Negative for fever and weight loss.  Respiratory:  Negative for cough, shortness of breath and wheezing.   Cardiovascular:  Negative for chest pain, palpitations and leg swelling.  Gastrointestinal:  Negative for abdominal pain and blood in stool.  Genitourinary:  Positive for frequency. Negative for dysuria, hematuria and urgency.       Bulge  Skin:  Negative for rash.  Neurological:  Positive for weakness. Negative for dizziness and headaches.  Endo/Heme/Allergies:  Bruises/bleeds easily.  Psychiatric/Behavioral:  Negative for depression. The patient is nervous/anxious.      OBJECTIVE Physical Exam: There were no vitals filed for this visit.  Physical Exam Constitutional:      General: She is not in acute distress.    Appearance: Normal appearance.  Genitourinary:     Bladder and urethral meatus normal.     No lesions in the vagina.     Right Labia: No rash, tenderness, lesions, skin changes or Bartholin's cyst.    Left Labia: No tenderness, lesions, skin changes, Bartholin's cyst or rash.    No vaginal discharge, erythema, tenderness, bleeding, ulceration or granulation tissue.      Right Adnexa: not tender, not full and no mass present.    Left Adnexa: not tender, not full and no mass present.    No cervical motion tenderness, discharge, friability, lesion, polyp or  nabothian cyst.     Uterus is not enlarged, fixed, tender or irregular.     No uterine mass detected.    Urethral meatus caruncle not present.    No urethral prolapse, tenderness, mass, hypermobility or discharge present.     Bladder is not tender, urgency on palpation not present and masses not present.      Levator ani not tender, obturator internus not tender, no asymmetrical contractions present and no pelvic spasms present.    Anal wink present and BC reflex present. Cardiovascular:     Rate and Rhythm: Normal rate.  Pulmonary:     Effort: Pulmonary effort is normal. No respiratory distress.  Abdominal:     General: There is no distension.     Palpations: There is no mass.     Tenderness: There is no abdominal tenderness.     Hernia: No hernia is present.  Neurological:     Mental Status: She is alert.  Vitals reviewed. Exam conducted with a chaperone present.      POP-Q:   POP-Q  Aa                                               Ba                                                 C                                                Gh                                               Pb                                               tvl                                                Ap                                               Bp                                                 D      Rectal Exam:  Normal sphincter tone, {rectocele:24766} distal rectocele, enterocoele {DESC; PRESENT/NOT PRESENT:21021351}, no rectal masses, {sign of:24767} dyssynergia when asking the patient to bear down.  Post-Void Residual (PVR) by Bladder Scan: In order to evaluate bladder emptying, we discussed obtaining a postvoid residual and patient agreed to this procedure.  Procedure: The ultrasound unit was placed on the patient's abdomen in the suprapubic region after the patient had voided.      Laboratory Results: Lab Results   Component Value Date   COLORU yellow 10/16/2013   CLARITYU clear 10/16/2013   GLUCOSEUR neg 10/16/2013   BILIRUBINUR NEGATIVE 01/07/2021   KETONESU neg 10/16/2013   SPECGRAV <=1.005 10/16/2013   RBCUR large 10/16/2013   PHUR 5.0 10/16/2013   PROTEINUR neg 10/16/2013   UROBILINOGEN 0.2 01/07/2021   LEUKOCYTESUR SMALL (A) 01/07/2021    Lab Results  Component Value Date   CREATININE 0.82 07/25/2023   CREATININE 0.90 01/22/2023   CREATININE 0.81 07/25/2022    Lab Results  Component Value Date   HGBA1C 6.1 07/25/2023    Lab Results  Component Value Date   HGB 14.4 07/25/2023     ASSESSMENT AND PLAN Ms. Asman is a 78 y.o. with: No diagnosis found.  There  are no diagnoses linked to this encounter.   Darlene Ehlers, MD

## 2023-11-28 ENCOUNTER — Ambulatory Visit: Payer: Medicare Other | Admitting: Obstetrics

## 2023-11-29 DIAGNOSIS — W19XXXA Unspecified fall, initial encounter: Secondary | ICD-10-CM | POA: Insufficient documentation

## 2023-11-29 DIAGNOSIS — E041 Nontoxic single thyroid nodule: Secondary | ICD-10-CM | POA: Insufficient documentation

## 2023-11-29 DIAGNOSIS — S0990XA Unspecified injury of head, initial encounter: Secondary | ICD-10-CM | POA: Insufficient documentation

## 2023-11-29 NOTE — Patient Instructions (Incomplete)
     Medications changes include :   None    A thyroid  ultrasound was ordered and someone will call you to schedule an appointment.     Return for follow up as scheduled.

## 2023-11-29 NOTE — Progress Notes (Unsigned)
 Subjective:    Patient ID: [name], female    DOB: [birth year], 78 y.o.   MRN: [id number]     HPI [name] is here for follow up from the ED.    ED 4/20 - she fell - she tripped on a door ledge and hit her head.  No LOC.  She had pain in right forehead above eye brow and right wrist/thumb.  She had some abrasions on right knee and left great toe.    No chest pain, abd pain, numbness/weakness or change in vision.  CT head - Right supraorbital and temporal scalp hematoma w/o fx or foreign body. No acute intracranial abnormality.  Ct neck w/o acute injury.  3.6 cm thyroid  nodule seen. No fx in right hand/wrist.    Forehead laceration - 3 cm - repaired with tissue adhesive.  Taking tylenol  or hydrocodone  for the pain.  Her right wrist pain got much worse after leaving the ED - it has improved -wearing a brace.       Medications and allergies reviewed with patient and updated if appropriate.  Current Outpatient Medications on File Prior to Visit  Medication Sig Dispense Refill   acetaminophen  (TYLENOL ) 650 MG CR tablet Take 650 mg by mouth 2 (two) times daily.      bifidobacterium infantis (ALIGN) capsule Take 1 capsule by mouth daily. 30 capsule 11   cholecalciferol (VITAMIN D ) 1000 UNITS tablet Take 2,000 Units by mouth daily.     co-enzyme Q-10 50 MG capsule Take 50 mg by mouth daily.     diltiazem  (CARDIZEM  CD) 180 MG 24 hr capsule TAKE 1 CAPSULE BY MOUTH TWICE A DAY 180 capsule 3   diltiazem  (CARDIZEM ) 30 MG tablet TAKE 1 TABLET EVERY 4 HOURS AS NEEDED FOR AFIB HEART RATE >100 30 tablet 2   DULoxetine  (CYMBALTA ) 30 MG capsule Take 1 capsule (30 mg total) by mouth daily. 90 capsule 1   hydrochlorothiazide  (HYDRODIURIL ) 25 MG tablet TAKE 1 TABLET (25 MG TOTAL) BY MOUTH DAILY. 90 tablet 1   HYDROcodone -acetaminophen  (NORCO/VICODIN) 5-325 MG tablet Take 1 tablet by mouth every 6 (six) hours as needed for severe pain (pain score 7-10). 20 tablet 0   KLOR-CON  M10 10 MEQ  tablet TAKE 1 TABLET BY MOUTH THREE TIMES A DAY 270 tablet 1   lidocaine  (LIDODERM ) 5 % Place 1 patch onto the skin daily. Remove & Discard patch within 12 hours or as directed by MD 30 patch 0   lisinopril  (ZESTRIL ) 40 MG tablet Take 1 tablet (40 mg total) by mouth daily. 90 tablet 1   magnesium oxide (MAG-OX) 400 MG tablet Take 400 mg by mouth daily.     memantine  (NAMENDA ) 5 MG tablet Take 1 tablet (5 mg at night) for 2 weeks, then increase to 1 tablet (5 mg) twice a day 180 tablet 3   pravastatin  (PRAVACHOL ) 40 MG tablet Take 1 tablet (40 mg total) by mouth daily. 90 tablet 3   rivaroxaban  (XARELTO ) 20 MG TABS tablet Take 1 tablet (20 mg total) by mouth daily with supper. 90 tablet 1   tiZANidine  (ZANAFLEX ) 4 MG tablet Take 1 tablet (4 mg total) by mouth every 6 (six) hours as needed for muscle spasms. 30 tablet 0   No current facility-administered medications on file prior to visit.     Review of Systems  Eyes:  Negative for visual disturbance.  Gastrointestinal:  Positive for nausea.  Neurological:  Negative for dizziness, light-headedness and  headaches.       Objective:   Vitals:   11/30/23 1112  BP: 136/80  Pulse: 92  Temp: 97.9 F (36.6 C)  SpO2: 96%   BP Readings from Last 3 Encounters:  11/30/23 136/80  11/25/23 (!) 142/77  11/02/23 137/67   Wt Readings from Last 3 Encounters:  11/30/23 169 lb (76.7 kg)  11/02/23 164 lb 14.5 oz (74.8 kg)  10/11/23 164 lb (74.4 kg)   Body mass index is 29.01 kg/m.    Physical Exam Constitutional:      General: She is not in acute distress.    Appearance: Normal appearance.  HENT:     Head: Normocephalic.     Comments: Hematoma right forehead with bruising right side of face to jaw Eyes:     Conjunctiva/sclera: Conjunctivae normal.  Cardiovascular:     Rate and Rhythm: Normal rate and regular rhythm.     Heart sounds: Normal heart sounds.  Pulmonary:     Effort: Pulmonary effort is normal. No respiratory distress.      Breath sounds: Normal breath sounds. No wheezing.  Musculoskeletal:     Cervical back: Neck supple. No tenderness.     Right lower leg: No edema.     Left lower leg: No edema.  Lymphadenopathy:     Cervical: No cervical adenopathy.  Skin:    General: Skin is warm and dry.     Findings: No rash.  Neurological:     Mental Status: She is alert. Mental status is at baseline.  Psychiatric:        Mood and Affect: Mood normal.        Behavior: Behavior normal.        Lab Results  Component Value Date   WBC 7.8 07/25/2023   HGB 14.4 07/25/2023   HCT 43.0 07/25/2023   PLT 306.0 07/25/2023   GLUCOSE 87 07/25/2023   CHOL 177 07/25/2023   TRIG 204.0 (H) 07/25/2023   HDL 50.60 07/25/2023   LDLDIRECT 87.0 01/23/2022   LDLCALC 86 07/25/2023   ALT 10 07/25/2023   AST 16 07/25/2023   NA 140 07/25/2023   K 4.2 07/25/2023   CL 103 07/25/2023   CREATININE 0.82 07/25/2023   BUN 22 07/25/2023   CO2 28 07/25/2023   TSH 1.42 10/03/2023   INR 1.4 01/25/2010   HGBA1C 6.1 07/25/2023   DG Hand Complete Right CLINICAL DATA:  Fall.  Pain.  EXAM: RIGHT HAND - COMPLETE 3+ VIEW  COMPARISON:  None Available.  FINDINGS: There is diffuse decreased bone mineralization. Severe thumb carpometacarpal joint space narrowing, subchondral sclerosis, and peripheral osteophytosis. Moderate to severe triscaphe joint space narrowing. Mild calcification overlying the triangular fibrocartilage complex. Moderate thumb metacarpophalangeal, severe lateral thumb interphalangeal, and severe second through fifth PIP and third and fourth DIP greater than second and fifth DIP joint space narrowing.  There are moderate central erosions within the second, third, and fifth PIP joints and moderate central erosions within the third and fourth DIP joints and fourth PIP joint. Moderate to severe second and moderate third metacarpophalangeal joint space narrowing.  No acute fracture or  dislocation.  IMPRESSION: 1. No acute fracture. 2. 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 There are central erosions within multiple interphalangeal joints, as above. This is most consistent with erosive osteoarthritis.  Electronically Signed   By: Bertina Broccoli M.D.   On: 11/25/2023 19:52 DG Wrist Complete Right CLINICAL DATA:  Fall, pain.  EXAM: RIGHT  WRIST - COMPLETE 4 VIEW  COMPARISON:  08/12/2012.  FINDINGS: Osteopenia. Sclerotic changes with osteophytes base of the thumb consistent with osteoarthritis. Radiocarpal joint space narrowing. No acute fracture, dislocation or subluxation. Soft tissues unremarkable.  IMPRESSION: Degenerative changes. Osteopenia.  Electronically Signed   By: Sydell Eva M.D.   On: 11/25/2023 18:42 CT Cervical Spine Wo Contrast CLINICAL DATA:  Fall. Trauma to head. Patient is on blood thinners.  EXAM: CT CERVICAL SPINE WITHOUT CONTRAST  TECHNIQUE: Multidetector CT imaging of the cervical spine was performed without intravenous contrast. Multiplanar CT image reconstructions were also generated.  RADIATION DOSE REDUCTION: This exam was performed according to the departmental dose-optimization program which includes automated exposure control, adjustment of the mA and/or kV according to patient size and/or use of iterative reconstruction technique.  COMPARISON:  Cervical spine radiographs 06/11/2013.  FINDINGS: Alignment: Grade 1 degenerative anterolisthesis measures 3 mm. Minimal anterolisthesis is present at T1-2. No other significant listhesis is present in the cervical spine. Mild straightening of the normal cervical lordosis is present. Endplate change in uncovertebral spurring is greatest at C4-5. No focal osseous lesions are present.  Skull base and vertebrae: The craniocervical junction is normal. Vertebral body heights and alignment are normal.  Soft tissues  and spinal canal: Asymmetric enlargement of the right lobe of the thyroid  contains a 3.5 cm nodule nodule. A 1.3 cm nodule is present in the left lobe. Mild atherosclerotic calcifications are present at the carotid bifurcations bilaterally without significant stenosis. No significant adenopathy is present.  Disc levels: The lung apices are clear. The thoracic inlet is within normal limits. Moderate foraminal stenosis is worse right than left at C4-5 secondary to uncovertebral and facet hypertrophy. Moderate left and mild right foraminal narrowing is present at C5-6 and C6-7.  Upper chest: The lung apices are clear. The thoracic inlet is within normal limits.  IMPRESSION: 1. No acute or focal lesion to explain the patient's symptoms. 2. Multilevel degenerative changes of the cervical spine as described. 3. Moderate foraminal stenosis is worse right than left at C4-5. 4. Moderate left and mild right foraminal narrowing at C5-6 and C6-7. 5. Asymmetric enlargement of the right lobe of the thyroid  contains a 3.5 cm nodule. A 1.3 cm nodule is present in the left lobe. Recommend non-emergent thyroid  ultrasound. Reference: J Am Coll Radiol. 2015 Feb;12(2): 143-50  Electronically Signed   By: Audree Leas M.D.   On: 11/25/2023 18:00 CT Head Wo Contrast CLINICAL DATA:  Head trauma, minor. Patient fell from a standing position. The patient is on blood thinners.  EXAM: CT HEAD WITHOUT CONTRAST  TECHNIQUE: Contiguous axial images were obtained from the base of the skull through the vertex without intravenous contrast.  RADIATION DOSE REDUCTION: This exam was performed according to the departmental dose-optimization program which includes automated exposure control, adjustment of the mA and/or kV according to patient size and/or use of iterative reconstruction technique.  COMPARISON:  MR head without contrast 11/07/2023.  FINDINGS: Brain: No acute infarct, hemorrhage, or  mass lesion is present. Mild atrophy and white matter changes are likely within normal limits for age. The ventricles are of normal size. No significant extraaxial fluid collection is present.  The brainstem and cerebellum are within normal limits. Midline structures are within normal limits.  Vascular: Atherosclerotic calcifications are present within the cavernous internal carotid arteries bilaterally. No hyperdense vessel is present.  Skull: A right supraorbital and temporal scalp hematoma is present. No underlying fracture or radiopaque foreign body is present.  Sinuses/Orbits: A fluid level is present in the right sphenoid sinus. The paranasal sinuses and mastoid air cells are otherwise clear. No acute fractures are associated. The globes and orbits are within normal limits.  IMPRESSION: 1. Right supraorbital and temporal scalp hematoma without underlying fracture or radiopaque foreign body. 2. No acute intracranial abnormality. 3. Mild atrophy and white matter disease is likely within normal limits for age. 4. Fluid level in the right sphenoid sinus. This may represent acute sinusitis.  Electronically Signed   By: Audree Leas M.D.   On: 11/25/2023 17:52    Assessment & Plan:    See Problem List for Assessment and Plan of chronic medical problems.

## 2023-11-30 ENCOUNTER — Encounter: Payer: Self-pay | Admitting: Internal Medicine

## 2023-11-30 ENCOUNTER — Ambulatory Visit (INDEPENDENT_AMBULATORY_CARE_PROVIDER_SITE_OTHER): Admitting: Internal Medicine

## 2023-11-30 VITALS — BP 136/80 | HR 92 | Temp 97.9°F | Ht 64.0 in | Wt 169.0 lb

## 2023-11-30 DIAGNOSIS — E041 Nontoxic single thyroid nodule: Secondary | ICD-10-CM | POA: Diagnosis not present

## 2023-11-30 DIAGNOSIS — S0990XA Unspecified injury of head, initial encounter: Secondary | ICD-10-CM

## 2023-11-30 DIAGNOSIS — W19XXXA Unspecified fall, initial encounter: Secondary | ICD-10-CM | POA: Diagnosis not present

## 2023-11-30 DIAGNOSIS — M25531 Pain in right wrist: Secondary | ICD-10-CM | POA: Diagnosis not present

## 2023-11-30 NOTE — Assessment & Plan Note (Signed)
 New Seen on CT scan in the ED - 3.6 cm US  ordered - discussed FNA is likely going to be recomened - will await US  report

## 2023-11-30 NOTE — Assessment & Plan Note (Signed)
 Acute [name] - was not paying attention as she went through a door and fell  Discussed fall prevention

## 2023-11-30 NOTE — Assessment & Plan Note (Signed)
 Acute Related to fall Taking tylenol  for mild pain, hydrocodone  for mod-severe pain Continue vicodin 5-325mg   one tab q 6 hr prn -  will refill as needed

## 2023-11-30 NOTE — Assessment & Plan Note (Signed)
 Hit right forehead - residual hematoma - has improved Bruising improving Tylenol  or hydrocodane prn for pain

## 2023-12-01 ENCOUNTER — Ambulatory Visit (HOSPITAL_BASED_OUTPATIENT_CLINIC_OR_DEPARTMENT_OTHER)
Admission: RE | Admit: 2023-12-01 | Discharge: 2023-12-01 | Disposition: A | Discharge status: Home / Self Care | Source: Ambulatory Visit | Attending: Internal Medicine | Admitting: Internal Medicine

## 2023-12-01 DIAGNOSIS — E041 Nontoxic single thyroid nodule: Secondary | ICD-10-CM | POA: Diagnosis not present

## 2023-12-02 ENCOUNTER — Encounter: Payer: Self-pay | Admitting: Internal Medicine

## 2023-12-02 DIAGNOSIS — E041 Nontoxic single thyroid nodule: Secondary | ICD-10-CM

## 2023-12-03 ENCOUNTER — Other Ambulatory Visit: Payer: Medicare Other

## 2023-12-04 ENCOUNTER — Encounter: Payer: Medicare Other | Admitting: Psychology

## 2023-12-05 ENCOUNTER — Encounter (HOSPITAL_BASED_OUTPATIENT_CLINIC_OR_DEPARTMENT_OTHER): Admitting: Physical Therapy

## 2023-12-06 ENCOUNTER — Ambulatory Visit (HOSPITAL_BASED_OUTPATIENT_CLINIC_OR_DEPARTMENT_OTHER): Admitting: Physical Therapy

## 2023-12-07 DIAGNOSIS — W01198A Fall on same level from slipping, tripping and stumbling with subsequent striking against other object, initial encounter: Secondary | ICD-10-CM | POA: Diagnosis not present

## 2023-12-07 DIAGNOSIS — M25531 Pain in right wrist: Secondary | ICD-10-CM | POA: Diagnosis not present

## 2023-12-07 DIAGNOSIS — M154 Erosive (osteo)arthritis: Secondary | ICD-10-CM | POA: Diagnosis not present

## 2023-12-07 DIAGNOSIS — M79644 Pain in right finger(s): Secondary | ICD-10-CM | POA: Diagnosis not present

## 2023-12-07 DIAGNOSIS — S62501A Fracture of unspecified phalanx of right thumb, initial encounter for closed fracture: Secondary | ICD-10-CM | POA: Diagnosis not present

## 2023-12-10 ENCOUNTER — Telehealth: Payer: Self-pay

## 2023-12-10 NOTE — Telephone Encounter (Signed)
 I called [facility], Patient referral for MRI brain wo contrast, was pending. [facility] stated, "patient can reopen MRI claim for extra information [id number], they can do open MRI, which is what was requested. I have the paper in my folder, I tried to call patient. Will attempt again, in a little bit. To get her rescheduled

## 2023-12-14 ENCOUNTER — Ambulatory Visit (HOSPITAL_BASED_OUTPATIENT_CLINIC_OR_DEPARTMENT_OTHER): Attending: Sports Medicine | Admitting: Physical Therapy

## 2023-12-14 ENCOUNTER — Encounter (HOSPITAL_BASED_OUTPATIENT_CLINIC_OR_DEPARTMENT_OTHER): Payer: Self-pay | Admitting: Physical Therapy

## 2023-12-14 DIAGNOSIS — M25551 Pain in right hip: Secondary | ICD-10-CM | POA: Insufficient documentation

## 2023-12-14 DIAGNOSIS — M25561 Pain in right knee: Secondary | ICD-10-CM | POA: Diagnosis not present

## 2023-12-14 DIAGNOSIS — M25552 Pain in left hip: Secondary | ICD-10-CM | POA: Diagnosis not present

## 2023-12-14 DIAGNOSIS — M25562 Pain in left knee: Secondary | ICD-10-CM | POA: Insufficient documentation

## 2023-12-14 DIAGNOSIS — M6281 Muscle weakness (generalized): Secondary | ICD-10-CM | POA: Insufficient documentation

## 2023-12-14 DIAGNOSIS — R29898 Other symptoms and signs involving the musculoskeletal system: Secondary | ICD-10-CM | POA: Diagnosis not present

## 2023-12-14 DIAGNOSIS — R2689 Other abnormalities of gait and mobility: Secondary | ICD-10-CM | POA: Diagnosis not present

## 2023-12-14 NOTE — Therapy (Signed)
 OUTPATIENT PHYSICAL THERAPY TREATMENT   Patient Name: [name] MRN: [id number] DOB:1946/01/18, 78 y.o., female Today's Date: 12/14/2023  END OF SESSION:  PT End of Session - 12/14/23 1305     Visit Number 2    Number of Visits 16    Date for PT Re-Evaluation 01/17/24    Authorization Type MEDICARE    Progress Note Due on Visit 10    PT Start Time 1301    PT Stop Time 1341    PT Time Calculation (min) 40 min    Activity Tolerance Patient tolerated treatment well    Behavior During Therapy WFL for tasks assessed/performed              Past Medical History:  Diagnosis Date   ARTHRITIS, HIPS, BILATERAL    Atrial fibrillation (HCC)    chronic anticoag - Pradaxa    BURSITIS, SUBTROCHANTERIC    HYPERTENSION    MITRAL VALVE PROLAPSE    Mixed hyperlipidemia    Past Surgical History:  Procedure Laterality Date   DILATION AND CURETTAGE OF UTERUS     TONSILLECTOMY     Patient Active Problem List   Diagnosis Date Noted   Wrist pain, acute, right 11/30/2023   Thyroid  nodule 11/29/2023   Fall 11/29/2023   Head trauma 11/29/2023   Anxiety 10/11/2023   Memory impairment 10/03/2023   Intermittent right-sided chest pain 07/25/2023   Pruritus - legs 01/22/2023   Vitamin D  deficiency 01/22/2022   Secondary hypercoagulable state (HCC) 11/17/2020   Nasal lesion 11/08/2020   B12 deficiency 10/28/2019   IBS (irritable bowel syndrome) 10/28/2019   Arthritis of right hand 07/22/2019   Osteoarthritis of hand 06/05/2019   Presbycusis of both ears 05/14/2018   ETD (Eustachian tube dysfunction), bilateral 04/12/2018   Prediabetes 05/15/2017   Pain of right heel 01/09/2017   Primary osteoarthritis of both feet 07/10/2016   Primary osteoarthritis of both hands 07/10/2016   Alopecia totalis 06/05/2016   Dermatochalasis of both upper eyelids 10/12/2015   H/O dysplastic nevus 08/03/2015   Degenerative lumbar disc 12/04/2013   Hematuria, undiagnosed cause 09/02/2013    Osteopenia 06/11/2013   Primary generalized (osteo)arthritis 12/18/2011   Mixed hyperlipidemia 03/25/2009   Essential hypertension 03/24/2009   ATRIAL FIBRILLATION 03/24/2009   Greater trochanteric bursitis of left hip 02/12/2007    PCP: Colene Dauphin, MD  REFERRING PROVIDER: Clyde Darling, DO  REFERRING DIAG: [phone] (ICD-10-CM) - Pain in left hip R26.2 (ICD-10-CM) - Difficulty in walking, not elsewhere classified Bilateral OA of hip, bilateral OA of knee, pain in R hip, pain in L hip  THERAPY DIAG:  Bilateral hip pain  Pain in both knees, unspecified chronicity  Muscle weakness (generalized)  Other abnormalities of gait and mobility  Other symptoms and signs involving the musculoskeletal system  Rationale for Evaluation and Treatment: Rehabilitation  ONSET DATE: chronic   SUBJECTIVE:   SUBJECTIVE STATEMENT: Patient states that her fall had her not doing as many exercises the last few weeks.  EVAL: Patient states she just joined the gym a few weeks ago. Then had a problem with her hip. Ruptured disc in back twice. Patient states hip pain for a long time. Shots helped a little in R. R hip is worse than the left. On way to [place]  she had a pain in R glute where she had to go to ER. L hip has some pain as well. Symptoms are worse with prolonged walking. Wants to able to workout again, eager to get  stronger. Had weak abductors in the past. Legs are very weak and has trouble with stairs. L knee bothers her and troubles with squats and lunges.  PERTINENT HISTORY: hypertension, bilateral hip arthritis, atrial fibrillation  PAIN:  Are you having pain? No at rest  PRECAUTIONS: None  WEIGHT BEARING RESTRICTIONS: No  FALLS:  Has patient fallen in last 6 months? No  PLOF: Independent  PATIENT GOALS: get stronger, exercise   OBJECTIVE: (objective measures from initial evaluation unless otherwise dated)  PATIENT SURVEYS:  LEFS 31/80  COGNITION: Overall cognitive  status: Within functional limits for tasks assessed     SENSATION: WFL  POSTURE: No Significant postural limitations  PALPATION: Grossly tender bilateral glutes  LOWER EXTREMITY ROM:  Active ROM Right eval Left eval  Hip flexion    Hip extension    Hip abduction    Hip adduction    Hip internal rotation    Hip external rotation    Knee flexion    Knee extension    Ankle dorsiflexion    Ankle plantarflexion    Ankle inversion    Ankle eversion     (Blank rows = not tested) *= pain/symptoms  LOWER EXTREMITY MMT:  MMT Right eval Left eval  Hip flexion 4 4-  Hip extension 3 3  Hip abduction 3- 3  Hip adduction    Hip internal rotation    Hip external rotation    Knee flexion 4+ 4+  Knee extension 4+ 4+  Ankle dorsiflexion    Ankle plantarflexion    Ankle inversion    Ankle eversion     (Blank rows = not tested) *= pain/symptoms    FUNCTIONAL TESTS:  5 times sit to stand: 31.35 seconds with hands on thighs, labored, relies L>RLE, R valgus Stairs: alternating pattern, 7 inch steps, unilateral HHA, L knee pain, decreased concentric and eccentric strength and motor control bilaterally, increased valgus on R  GAIT: Distance walked: 100 feet Assistive device utilized: None Level of assistance: Complete Independence Comments: R truncal lean on R stance, decreased hip extension bilaterally   TODAY'S TREATMENT:                                                                                                                              DATE:  12/14/23 Bridge 2 x 10 Clam 2 x 10  Prone hip extension 2 x 10 SLR with ab set 2 x 10  STS 2 x 10  Long arc quad 2# 10 x 5 second holds Standing hip abduction 2 x 10 Step up 4 inch 1 x 10  11/22/23 Bridge 2 x 10 Clam 2 x 10    PATIENT EDUCATION:  Education details: Patient educated on exam findings, POC, scope of PT, HEP, relevant anatomy and pathology. 12/14/23: HEP Person educated: Patient Education method:  Explanation, Demonstration, and Handouts Education comprehension: verbalized understanding, returned demonstration, verbal cues required, and tactile cues required  HOME EXERCISE PROGRAM: Access Code:  [email or web address] URL: [email or web address] Date: 11/22/2023 Prepared by: Debria Fang Devyn Griffing  Exercises - Supine Bridge  - 1 x daily - 7 x weekly - 2 sets - 10 reps - Clamshell  - 1 x daily - 7 x weekly - 2 sets - 10 reps  ASSESSMENT:  CLINICAL IMPRESSION: Review of previously completed exercises with minimal to no cueing required. Continued with glute strengthening and all exercises performed with moderate difficulty. Patient will continue to benefit from physical therapy in order to improve function and reduce impairment.   OBJECTIVE IMPAIRMENTS: Abnormal gait, decreased activity tolerance, decreased balance, decreased endurance, decreased mobility, difficulty walking, decreased ROM, decreased strength, increased muscle spasms, impaired flexibility, improper body mechanics, and pain.   ACTIVITY LIMITATIONS: carrying, lifting, bending, standing, squatting, stairs, transfers, locomotion level, and caring for others  PARTICIPATION LIMITATIONS: meal prep, cleaning, laundry, shopping, community activity, and yard work  PERSONAL FACTORS: Time since onset of injury/illness/exacerbation and 3+ comorbidities: HTN, afib, hx hip and knee pain, hx back pain are also affecting patient's functional outcome.   REHAB POTENTIAL: Good  CLINICAL DECISION MAKING: Evolving/moderate complexity  EVALUATION COMPLEXITY: Moderate   GOALS: Goals reviewed with patient? Yes  SHORT TERM GOALS: Target date: 12/20/2023    Patient will be independent with HEP in order to improve functional outcomes. Baseline: Goal status: INITIAL  2.  Patient will report at least 25% improvement in symptoms for improved quality of life. Baseline: Goal status: INITIAL    LONG TERM GOALS: Target date: 01/17/2024     Patient will report at least 75% improvement in symptoms for improved quality of life. Baseline:  Goal status: INITIAL  2.  Patient will improve LEFS score by at least 9 points in order to indicate improved tolerance to activity. Baseline: 31/80 Goal status: INITIAL  3.  Patient will be able to navigate stairs with reciprocal pattern without compensation in order to demonstrate improved LE strength. Baseline:  Goal status: INITIAL  4.  Patient will demonstrate grade of 5/5 MMT grade in all tested musculature as evidence of improved strength to assist with stair ambulation and gait.  Baseline:  Goal status: INITIAL  5.  Patient will be able to complete 5x STS in under 15 seconds in order to demo improved functional strength. Baseline:  Goal status: INITIAL  6.  Patient will be able to perform full gym routine independently.  Baseline:  Goal status: INITIAL   PLAN:  PT FREQUENCY: 2x/week  PT DURATION: 8 weeks  PLANNED INTERVENTIONS: [id number]- PT Re-evaluation, 97110-Therapeutic exercises, 97530- Therapeutic activity, 97112- Neuromuscular re-education, 97535- Self Care, 02725- Manual therapy, [phone]- Gait training, [phone]- Orthotic Fit/training, [phone]- Canalith repositioning, [id number]- Aquatic Therapy, [phone]- Splinting, Patient/Family education, Balance training, Stair training, Taping, Dry Needling, Joint mobilization, Joint manipulation, Spinal manipulation, Spinal mobilization, Scar mobilization, and DME instructions.  PLAN FOR NEXT SESSION: f/u with HEP, glute and quad strength, functional strength, progress to gym routine as able    Beather Liming, PT, DPT 12/14/2023, 1:06 PM

## 2023-12-18 ENCOUNTER — Encounter (HOSPITAL_BASED_OUTPATIENT_CLINIC_OR_DEPARTMENT_OTHER): Payer: Self-pay | Admitting: Physical Therapy

## 2023-12-18 ENCOUNTER — Ambulatory Visit (HOSPITAL_BASED_OUTPATIENT_CLINIC_OR_DEPARTMENT_OTHER): Admitting: Physical Therapy

## 2023-12-18 DIAGNOSIS — M25562 Pain in left knee: Secondary | ICD-10-CM | POA: Diagnosis not present

## 2023-12-18 DIAGNOSIS — M25561 Pain in right knee: Secondary | ICD-10-CM | POA: Diagnosis not present

## 2023-12-18 DIAGNOSIS — R29898 Other symptoms and signs involving the musculoskeletal system: Secondary | ICD-10-CM

## 2023-12-18 DIAGNOSIS — M6281 Muscle weakness (generalized): Secondary | ICD-10-CM

## 2023-12-18 DIAGNOSIS — E041 Nontoxic single thyroid nodule: Secondary | ICD-10-CM | POA: Diagnosis not present

## 2023-12-18 DIAGNOSIS — R2689 Other abnormalities of gait and mobility: Secondary | ICD-10-CM | POA: Diagnosis not present

## 2023-12-18 DIAGNOSIS — M25551 Pain in right hip: Secondary | ICD-10-CM

## 2023-12-18 DIAGNOSIS — M25552 Pain in left hip: Secondary | ICD-10-CM | POA: Diagnosis not present

## 2023-12-18 NOTE — Therapy (Signed)
 OUTPATIENT PHYSICAL THERAPY TREATMENT   Patient Name: [name] MRN: [id number] DOB:[birth year], 78 y.o., female Today's Date: 12/18/2023  END OF SESSION:  PT End of Session - 12/18/23 1303     Visit Number 3    Number of Visits 16    Date for PT Re-Evaluation 01/17/24    Authorization Type MEDICARE    Progress Note Due on Visit 10    PT Start Time 1301    PT Stop Time 1343    PT Time Calculation (min) 42 min    Activity Tolerance Patient tolerated treatment well    Behavior During Therapy WFL for tasks assessed/performed              Past Medical History:  Diagnosis Date   ARTHRITIS, HIPS, BILATERAL    Atrial fibrillation (HCC)    chronic anticoag - Pradaxa    BURSITIS, SUBTROCHANTERIC    HYPERTENSION    MITRAL VALVE PROLAPSE    Mixed hyperlipidemia    Past Surgical History:  Procedure Laterality Date   DILATION AND CURETTAGE OF UTERUS     TONSILLECTOMY     Patient Active Problem List   Diagnosis Date Noted   Wrist pain, acute, right 11/30/2023   Thyroid  nodule 11/29/2023   Fall 11/29/2023   Head trauma 11/29/2023   Anxiety 10/11/2023   Memory impairment 10/03/2023   Intermittent right-sided chest pain 07/25/2023   Pruritus - legs 01/22/2023   Vitamin D  deficiency 01/22/2022   Secondary hypercoagulable state (HCC) 11/17/2020   Nasal lesion 11/08/2020   B12 deficiency 10/28/2019   IBS (irritable bowel syndrome) 10/28/2019   Arthritis of right hand 07/22/2019   Osteoarthritis of hand 06/05/2019   Presbycusis of both ears 05/14/2018   ETD (Eustachian tube dysfunction), bilateral 04/12/2018   Prediabetes 05/15/2017   Pain of right heel 01/09/2017   Primary osteoarthritis of both feet 07/10/2016   Primary osteoarthritis of both hands 07/10/2016   Alopecia totalis 06/05/2016   Dermatochalasis of both upper eyelids 10/12/2015   H/O dysplastic nevus 08/03/2015   Degenerative lumbar disc 12/04/2013   Hematuria, undiagnosed cause 09/02/2013    Osteopenia 06/11/2013   Primary generalized (osteo)arthritis 12/18/2011   Mixed hyperlipidemia 03/25/2009   Essential hypertension 03/24/2009   ATRIAL FIBRILLATION 03/24/2009   Greater trochanteric bursitis of left hip 02/12/2007    PCP: Colene Dauphin, MD  REFERRING PROVIDER: Clyde Darling, DO  REFERRING DIAG: [phone] (ICD-10-CM) - Pain in left hip R26.2 (ICD-10-CM) - Difficulty in walking, not elsewhere classified Bilateral OA of hip, bilateral OA of knee, pain in R hip, pain in L hip  THERAPY DIAG:  Bilateral hip pain  Pain in both knees, unspecified chronicity  Muscle weakness (generalized)  Other abnormalities of gait and mobility  Other symptoms and signs involving the musculoskeletal system  Rationale for Evaluation and Treatment: Rehabilitation  ONSET DATE: chronic   SUBJECTIVE:   SUBJECTIVE STATEMENT: Patient states fatigued after last session but overall felt good.   EVAL: Patient states she just joined the gym a few weeks ago. Then had a problem with her hip. Ruptured disc in back twice. Patient states hip pain for a long time. Shots helped a little in R. R hip is worse than the left. On way to [place]  she had a pain in R glute where she had to go to ER. L hip has some pain as well. Symptoms are worse with prolonged walking. Wants to able to workout again, eager to get stronger. Had weak abductors in  the past. Legs are very weak and has trouble with stairs. L knee bothers her and troubles with squats and lunges.  PERTINENT HISTORY: hypertension, bilateral hip arthritis, atrial fibrillation  PAIN:  Are you having pain? No at rest  PRECAUTIONS: None  WEIGHT BEARING RESTRICTIONS: No  FALLS:  Has patient fallen in last 6 months? No  PLOF: Independent  PATIENT GOALS: get stronger, exercise   OBJECTIVE: (objective measures from initial evaluation unless otherwise dated)  PATIENT SURVEYS:  LEFS 31/80  COGNITION: Overall cognitive status: Within  functional limits for tasks assessed     SENSATION: WFL  POSTURE: No Significant postural limitations  PALPATION: Grossly tender bilateral glutes  LOWER EXTREMITY ROM:  Active ROM Right eval Left eval  Hip flexion    Hip extension    Hip abduction    Hip adduction    Hip internal rotation    Hip external rotation    Knee flexion    Knee extension    Ankle dorsiflexion    Ankle plantarflexion    Ankle inversion    Ankle eversion     (Blank rows = not tested) *= pain/symptoms  LOWER EXTREMITY MMT:  MMT Right eval Left eval  Hip flexion 4 4-  Hip extension 3 3  Hip abduction 3- 3  Hip adduction    Hip internal rotation    Hip external rotation    Knee flexion 4+ 4+  Knee extension 4+ 4+  Ankle dorsiflexion    Ankle plantarflexion    Ankle inversion    Ankle eversion     (Blank rows = not tested) *= pain/symptoms    FUNCTIONAL TESTS:  5 times sit to stand: 31.35 seconds with hands on thighs, labored, relies L>RLE, R valgus Stairs: alternating pattern, 7 inch steps, unilateral HHA, L knee pain, decreased concentric and eccentric strength and motor control bilaterally, increased valgus on R  GAIT: Distance walked: 100 feet Assistive device utilized: None Level of assistance: Complete Independence Comments: R truncal lean on R stance, decreased hip extension bilaterally   TODAY'S TREATMENT:                                                                                                                              DATE:  12/18/23 Prone hip extension 2 x 10 Sidelying hip abduction 2 x 10 STS chair with airex 2 x 10  Step up 4 inch 2 x 10 Lateral step up 4 inch 2 x 10    12/14/23 Bridge 2 x 10 Clam 2 x 10  Prone hip extension 2 x 10 SLR with ab set 2 x 10  STS 2 x 10  Long arc quad 2# 10 x 5 second holds Standing hip abduction 2 x 10 Step up 4 inch 1 x 10  11/22/23 Bridge 2 x 10 Clam 2 x 10    PATIENT EDUCATION:  Education details: Patient  educated on exam findings, POC, scope of PT, HEP,  relevant anatomy and pathology. 12/14/23: HEP Person educated: Patient Education method: Explanation, Demonstration, and Handouts Education comprehension: verbalized understanding, returned demonstration, verbal cues required, and tactile cues required  HOME EXERCISE PROGRAM: Access Code: [email or web address] URL: [email or web address] Date: 11/22/2023 Prepared by: Debria Fang Ranya Fiddler  Exercises - Supine Bridge  - 1 x daily - 7 x weekly - 2 sets - 10 reps - Clamshell  - 1 x daily - 7 x weekly - 2 sets - 10 reps  ASSESSMENT:  CLINICAL IMPRESSION: Continued with glute strengthening which is tolerated well. Glute weakness evident with table strengthening. Patient will continue to benefit from physical therapy in order to improve function and reduce impairment.   OBJECTIVE IMPAIRMENTS: Abnormal gait, decreased activity tolerance, decreased balance, decreased endurance, decreased mobility, difficulty walking, decreased ROM, decreased strength, increased muscle spasms, impaired flexibility, improper body mechanics, and pain.   ACTIVITY LIMITATIONS: carrying, lifting, bending, standing, squatting, stairs, transfers, locomotion level, and caring for others  PARTICIPATION LIMITATIONS: meal prep, cleaning, laundry, shopping, community activity, and yard work  PERSONAL FACTORS: Time since onset of injury/illness/exacerbation and 3+ comorbidities: HTN, afib, hx hip and knee pain, hx back pain are also affecting patient's functional outcome.   REHAB POTENTIAL: Good  CLINICAL DECISION MAKING: Evolving/moderate complexity  EVALUATION COMPLEXITY: Moderate   GOALS: Goals reviewed with patient? Yes  SHORT TERM GOALS: Target date: 12/20/2023    Patient will be independent with HEP in order to improve functional outcomes. Baseline: Goal status: INITIAL  2.  Patient will report at least 25% improvement in symptoms for improved quality of  life. Baseline: Goal status: INITIAL    LONG TERM GOALS: Target date: 01/17/2024    Patient will report at least 75% improvement in symptoms for improved quality of life. Baseline:  Goal status: INITIAL  2.  Patient will improve LEFS score by at least 9 points in order to indicate improved tolerance to activity. Baseline: 31/80 Goal status: INITIAL  3.  Patient will be able to navigate stairs with reciprocal pattern without compensation in order to demonstrate improved LE strength. Baseline:  Goal status: INITIAL  4.  Patient will demonstrate grade of 5/5 MMT grade in all tested musculature as evidence of improved strength to assist with stair ambulation and gait.  Baseline:  Goal status: INITIAL  5.  Patient will be able to complete 5x STS in under 15 seconds in order to demo improved functional strength. Baseline:  Goal status: INITIAL  6.  Patient will be able to perform full gym routine independently.  Baseline:  Goal status: INITIAL   PLAN:  PT FREQUENCY: 2x/week  PT DURATION: 8 weeks  PLANNED INTERVENTIONS: 97164- PT Re-evaluation, 97110-Therapeutic exercises, 97530- Therapeutic activity, 97112- Neuromuscular re-education, 97535- Self Care, [id number]- Manual therapy, [phone]- Gait training, [phone]- Orthotic Fit/training, [phone]- Canalith repositioning, [id number]- Aquatic Therapy, [phone]- Splinting, Patient/Family education, Balance training, Stair training, Taping, Dry Needling, Joint mobilization, Joint manipulation, Spinal manipulation, Spinal mobilization, Scar mobilization, and DME instructions.  PLAN FOR NEXT SESSION: f/u with HEP, glute and quad strength, functional strength, progress to gym routine as able    Beather Liming, PT, DPT 12/18/2023, 1:45 PM

## 2023-12-20 ENCOUNTER — Encounter (HOSPITAL_BASED_OUTPATIENT_CLINIC_OR_DEPARTMENT_OTHER): Payer: Self-pay | Admitting: Physical Therapy

## 2023-12-20 ENCOUNTER — Ambulatory Visit (HOSPITAL_BASED_OUTPATIENT_CLINIC_OR_DEPARTMENT_OTHER): Admitting: Physical Therapy

## 2023-12-20 ENCOUNTER — Encounter: Payer: Self-pay | Admitting: Otolaryngology

## 2023-12-20 DIAGNOSIS — M6281 Muscle weakness (generalized): Secondary | ICD-10-CM

## 2023-12-20 DIAGNOSIS — M25551 Pain in right hip: Secondary | ICD-10-CM | POA: Diagnosis not present

## 2023-12-20 DIAGNOSIS — M25552 Pain in left hip: Secondary | ICD-10-CM | POA: Diagnosis not present

## 2023-12-20 DIAGNOSIS — R2689 Other abnormalities of gait and mobility: Secondary | ICD-10-CM

## 2023-12-20 DIAGNOSIS — M25561 Pain in right knee: Secondary | ICD-10-CM

## 2023-12-20 DIAGNOSIS — M25562 Pain in left knee: Secondary | ICD-10-CM | POA: Diagnosis not present

## 2023-12-20 DIAGNOSIS — R29898 Other symptoms and signs involving the musculoskeletal system: Secondary | ICD-10-CM

## 2023-12-20 NOTE — Therapy (Signed)
 OUTPATIENT PHYSICAL THERAPY TREATMENT   Patient Name: [name] MRN: [id number] DOB:1946-05-06, 78 y.o., female Today's Date: 12/20/2023  END OF SESSION:  PT End of Session - 12/20/23 1259     Visit Number 4    Number of Visits 16    Date for PT Re-Evaluation 01/17/24    Authorization Type MEDICARE    Progress Note Due on Visit 10    PT Start Time 1301    PT Stop Time 1341    PT Time Calculation (min) 40 min    Activity Tolerance Patient tolerated treatment well    Behavior During Therapy WFL for tasks assessed/performed              Past Medical History:  Diagnosis Date   ARTHRITIS, HIPS, BILATERAL    Atrial fibrillation (HCC)    chronic anticoag - Pradaxa    BURSITIS, SUBTROCHANTERIC    HYPERTENSION    MITRAL VALVE PROLAPSE    Mixed hyperlipidemia    Past Surgical History:  Procedure Laterality Date   DILATION AND CURETTAGE OF UTERUS     TONSILLECTOMY     Patient Active Problem List   Diagnosis Date Noted   Wrist pain, acute, right 11/30/2023   Thyroid  nodule 11/29/2023   Fall 11/29/2023   Head trauma 11/29/2023   Anxiety 10/11/2023   Memory impairment 10/03/2023   Intermittent right-sided chest pain 07/25/2023   Pruritus - legs 01/22/2023   Vitamin D  deficiency 01/22/2022   Secondary hypercoagulable state (HCC) 11/17/2020   Nasal lesion 11/08/2020   B12 deficiency 10/28/2019   IBS (irritable bowel syndrome) 10/28/2019   Arthritis of right hand 07/22/2019   Osteoarthritis of hand 06/05/2019   Presbycusis of both ears 05/14/2018   ETD (Eustachian tube dysfunction), bilateral 04/12/2018   Prediabetes 05/15/2017   Pain of right heel 01/09/2017   Primary osteoarthritis of both feet 07/10/2016   Primary osteoarthritis of both hands 07/10/2016   Alopecia totalis 06/05/2016   Dermatochalasis of both upper eyelids 10/12/2015   H/O dysplastic nevus 08/03/2015   Degenerative lumbar disc 12/04/2013   Hematuria, undiagnosed cause 09/02/2013    Osteopenia 06/11/2013   Primary generalized (osteo)arthritis 12/18/2011   Mixed hyperlipidemia 03/25/2009   Essential hypertension 03/24/2009   ATRIAL FIBRILLATION 03/24/2009   Greater trochanteric bursitis of left hip 02/12/2007    PCP: Colene Dauphin, MD  REFERRING PROVIDER: Clyde Darling, DO  REFERRING DIAG: [phone] (ICD-10-CM) - Pain in left hip R26.2 (ICD-10-CM) - Difficulty in walking, not elsewhere classified Bilateral OA of hip, bilateral OA of knee, pain in R hip, pain in L hip  THERAPY DIAG:  Bilateral hip pain  Pain in both knees, unspecified chronicity  Muscle weakness (generalized)  Other abnormalities of gait and mobility  Other symptoms and signs involving the musculoskeletal system  Rationale for Evaluation and Treatment: Rehabilitation  ONSET DATE: chronic   SUBJECTIVE:   SUBJECTIVE STATEMENT: Patient states Tylenol  really does help her symptoms. HEP going well.   EVAL: Patient states she just joined the gym a few weeks ago. Then had a problem with her hip. Ruptured disc in back twice. Patient states hip pain for a long time. Shots helped a little in R. R hip is worse than the left. On way to [place]  she had a pain in R glute where she had to go to ER. L hip has some pain as well. Symptoms are worse with prolonged walking. Wants to able to workout again, eager to get stronger. Had weak abductors  in the past. Legs are very weak and has trouble with stairs. L knee bothers her and troubles with squats and lunges.  PERTINENT HISTORY: hypertension, bilateral hip arthritis, atrial fibrillation  PAIN:  Are you having pain? No at rest  PRECAUTIONS: None  WEIGHT BEARING RESTRICTIONS: No  FALLS:  Has patient fallen in last 6 months? No  PLOF: Independent  PATIENT GOALS: get stronger, exercise   OBJECTIVE: (objective measures from initial evaluation unless otherwise dated)  PATIENT SURVEYS:  LEFS 31/80  COGNITION: Overall cognitive status: Within  functional limits for tasks assessed     SENSATION: WFL  POSTURE: No Significant postural limitations  PALPATION: Grossly tender bilateral glutes  LOWER EXTREMITY ROM:  Active ROM Right eval Left eval  Hip flexion    Hip extension    Hip abduction    Hip adduction    Hip internal rotation    Hip external rotation    Knee flexion    Knee extension    Ankle dorsiflexion    Ankle plantarflexion    Ankle inversion    Ankle eversion     (Blank rows = not tested) *= pain/symptoms  LOWER EXTREMITY MMT:  MMT Right eval Left eval  Hip flexion 4 4-  Hip extension 3 3  Hip abduction 3- 3  Hip adduction    Hip internal rotation    Hip external rotation    Knee flexion 4+ 4+  Knee extension 4+ 4+  Ankle dorsiflexion    Ankle plantarflexion    Ankle inversion    Ankle eversion     (Blank rows = not tested) *= pain/symptoms    FUNCTIONAL TESTS:  5 times sit to stand: 31.35 seconds with hands on thighs, labored, relies L>RLE, R valgus Stairs: alternating pattern, 7 inch steps, unilateral HHA, L knee pain, decreased concentric and eccentric strength and motor control bilaterally, increased valgus on R  GAIT: Distance walked: 100 feet Assistive device utilized: None Level of assistance: Complete Independence Comments: R truncal lean on R stance, decreased hip extension bilaterally   TODAY'S TREATMENT:                                                                                                                              DATE:  12/20/23 Nustep 4 minutes LE only for dynamic warm up STS chair with airex 2 x 10  Long arc quad 2# 10 x 5 second holds Step up 4 inch 2 x 10 Lateral step up 4 inch 2 x 10  SLS with 3 way hip 5 x 5 second holds Standing alternating march with glute set 1 x 10  12/18/23 Prone hip extension 2 x 10 Sidelying hip abduction 2 x 10 STS chair with airex 2 x 10  Step up 4 inch 2 x 10 Lateral step up 4 inch 2 x 10    12/14/23 Bridge 2 x  10 Clam 2 x 10  Prone hip extension 2 x  10 SLR with ab set 2 x 10  STS 2 x 10  Long arc quad 2# 10 x 5 second holds Standing hip abduction 2 x 10 Step up 4 inch 1 x 10  11/22/23 Bridge 2 x 10 Clam 2 x 10    PATIENT EDUCATION:  Education details: Patient educated on exam findings, POC, scope of PT, HEP, relevant anatomy and pathology. 12/14/23: HEP Person educated: Patient Education method: Explanation, Demonstration, and Handouts Education comprehension: verbalized understanding, returned demonstration, verbal cues required, and tactile cues required  HOME EXERCISE PROGRAM: Access Code: [email or web address] URL: [email or web address] Date: 11/22/2023 Prepared by: Debria Fang Kamau Weatherall  Exercises - Supine Bridge  - 1 x daily - 7 x weekly - 2 sets - 10 reps - Clamshell  - 1 x daily - 7 x weekly - 2 sets - 10 reps  ASSESSMENT:  CLINICAL IMPRESSION: Began session on nustep for dynamic warm up and conditioning. Continued with LE and functional strengthening. UE support required for balance and strength deficit with most standing exercises. Moderate fatigue at EOS. Patient will continue to benefit from physical therapy in order to improve function and reduce impairment.   OBJECTIVE IMPAIRMENTS: Abnormal gait, decreased activity tolerance, decreased balance, decreased endurance, decreased mobility, difficulty walking, decreased ROM, decreased strength, increased muscle spasms, impaired flexibility, improper body mechanics, and pain.   ACTIVITY LIMITATIONS: carrying, lifting, bending, standing, squatting, stairs, transfers, locomotion level, and caring for others  PARTICIPATION LIMITATIONS: meal prep, cleaning, laundry, shopping, community activity, and yard work  PERSONAL FACTORS: Time since onset of injury/illness/exacerbation and 3+ comorbidities: HTN, afib, hx hip and knee pain, hx back pain are also affecting patient's functional outcome.   REHAB POTENTIAL: Good  CLINICAL  DECISION MAKING: Evolving/moderate complexity  EVALUATION COMPLEXITY: Moderate   GOALS: Goals reviewed with patient? Yes  SHORT TERM GOALS: Target date: 12/20/2023    Patient will be independent with HEP in order to improve functional outcomes. Baseline: Goal status: INITIAL  2.  Patient will report at least 25% improvement in symptoms for improved quality of life. Baseline: Goal status: INITIAL    LONG TERM GOALS: Target date: 01/17/2024    Patient will report at least 75% improvement in symptoms for improved quality of life. Baseline:  Goal status: INITIAL  2.  Patient will improve LEFS score by at least 9 points in order to indicate improved tolerance to activity. Baseline: 31/80 Goal status: INITIAL  3.  Patient will be able to navigate stairs with reciprocal pattern without compensation in order to demonstrate improved LE strength. Baseline:  Goal status: INITIAL  4.  Patient will demonstrate grade of 5/5 MMT grade in all tested musculature as evidence of improved strength to assist with stair ambulation and gait.  Baseline:  Goal status: INITIAL  5.  Patient will be able to complete 5x STS in under 15 seconds in order to demo improved functional strength. Baseline:  Goal status: INITIAL  6.  Patient will be able to perform full gym routine independently.  Baseline:  Goal status: INITIAL   PLAN:  PT FREQUENCY: 2x/week  PT DURATION: 8 weeks  PLANNED INTERVENTIONS: 97164- PT Re-evaluation, 97110-Therapeutic exercises, 97530- Therapeutic activity, 97112- Neuromuscular re-education, 97535- Self Care, 82956- Manual therapy, [phone]- Gait training, [phone]- Orthotic Fit/training, [phone]- Canalith repositioning, [id number]- Aquatic Therapy, [phone]- Splinting, Patient/Family education, Balance training, Stair training, Taping, Dry Needling, Joint mobilization, Joint manipulation, Spinal manipulation, Spinal mobilization, Scar mobilization, and DME instructions.  PLAN FOR NEXT  SESSION: f/u with HEP, glute and quad strength,  functional strength, progress to gym routine as able    Beather Liming, PT, DPT 12/20/2023, 1:00 PM

## 2023-12-21 ENCOUNTER — Other Ambulatory Visit: Payer: Self-pay | Admitting: Otolaryngology

## 2023-12-21 DIAGNOSIS — E041 Nontoxic single thyroid nodule: Secondary | ICD-10-CM

## 2023-12-24 ENCOUNTER — Other Ambulatory Visit: Payer: Self-pay | Admitting: Sports Medicine

## 2023-12-24 DIAGNOSIS — S62509A Fracture of unspecified phalanx of unspecified thumb, initial encounter for closed fracture: Secondary | ICD-10-CM | POA: Diagnosis not present

## 2023-12-24 DIAGNOSIS — M25541 Pain in joints of right hand: Secondary | ICD-10-CM | POA: Diagnosis not present

## 2023-12-24 DIAGNOSIS — M79641 Pain in right hand: Secondary | ICD-10-CM

## 2023-12-24 DIAGNOSIS — M19041 Primary osteoarthritis, right hand: Secondary | ICD-10-CM

## 2023-12-25 ENCOUNTER — Encounter (HOSPITAL_BASED_OUTPATIENT_CLINIC_OR_DEPARTMENT_OTHER): Payer: Self-pay

## 2023-12-25 ENCOUNTER — Ambulatory Visit (HOSPITAL_BASED_OUTPATIENT_CLINIC_OR_DEPARTMENT_OTHER)

## 2023-12-25 DIAGNOSIS — M25561 Pain in right knee: Secondary | ICD-10-CM | POA: Diagnosis not present

## 2023-12-25 DIAGNOSIS — M25562 Pain in left knee: Secondary | ICD-10-CM | POA: Diagnosis not present

## 2023-12-25 DIAGNOSIS — R2689 Other abnormalities of gait and mobility: Secondary | ICD-10-CM

## 2023-12-25 DIAGNOSIS — M6281 Muscle weakness (generalized): Secondary | ICD-10-CM | POA: Diagnosis not present

## 2023-12-25 DIAGNOSIS — M25551 Pain in right hip: Secondary | ICD-10-CM

## 2023-12-25 DIAGNOSIS — R29898 Other symptoms and signs involving the musculoskeletal system: Secondary | ICD-10-CM

## 2023-12-25 DIAGNOSIS — M25552 Pain in left hip: Secondary | ICD-10-CM | POA: Diagnosis not present

## 2023-12-25 NOTE — Therapy (Signed)
 OUTPATIENT PHYSICAL THERAPY TREATMENT   Patient Name: [name] MRN: [id number] DOB:Sep 03, 1945, 78 y.o., female Today's Date: 12/25/2023  END OF SESSION:  PT End of Session - 12/25/23 1307     Visit Number 5    Number of Visits 16    Date for PT Re-Evaluation 01/17/24    Authorization Type MEDICARE    Progress Note Due on Visit 10    PT Start Time 1302    PT Stop Time 1346    PT Time Calculation (min) 44 min    Activity Tolerance Patient tolerated treatment well    Behavior During Therapy WFL for tasks assessed/performed               Past Medical History:  Diagnosis Date   ARTHRITIS, HIPS, BILATERAL    Atrial fibrillation (HCC)    chronic anticoag - Pradaxa    BURSITIS, SUBTROCHANTERIC    HYPERTENSION    MITRAL VALVE PROLAPSE    Mixed hyperlipidemia    Past Surgical History:  Procedure Laterality Date   DILATION AND CURETTAGE OF UTERUS     TONSILLECTOMY     Patient Active Problem List   Diagnosis Date Noted   Wrist pain, acute, right 11/30/2023   Thyroid  nodule 11/29/2023   Fall 11/29/2023   Head trauma 11/29/2023   Anxiety 10/11/2023   Memory impairment 10/03/2023   Intermittent right-sided chest pain 07/25/2023   Pruritus - legs 01/22/2023   Vitamin D  deficiency 01/22/2022   Secondary hypercoagulable state (HCC) 11/17/2020   Nasal lesion 11/08/2020   B12 deficiency 10/28/2019   IBS (irritable bowel syndrome) 10/28/2019   Arthritis of right hand 07/22/2019   Osteoarthritis of hand 06/05/2019   Presbycusis of both ears 05/14/2018   ETD (Eustachian tube dysfunction), bilateral 04/12/2018   Prediabetes 05/15/2017   Pain of right heel 01/09/2017   Primary osteoarthritis of both feet 07/10/2016   Primary osteoarthritis of both hands 07/10/2016   Alopecia totalis 06/05/2016   Dermatochalasis of both upper eyelids 10/12/2015   H/O dysplastic nevus 08/03/2015   Degenerative lumbar disc 12/04/2013   Hematuria, undiagnosed cause 09/02/2013    Osteopenia 06/11/2013   Primary generalized (osteo)arthritis 12/18/2011   Mixed hyperlipidemia 03/25/2009   Essential hypertension 03/24/2009   ATRIAL FIBRILLATION 03/24/2009   Greater trochanteric bursitis of left hip 02/12/2007    PCP: Colene Dauphin, MD  REFERRING PROVIDER: Clyde Darling, DO  REFERRING DIAG: [phone] (ICD-10-CM) - Pain in left hip R26.2 (ICD-10-CM) - Difficulty in walking, not elsewhere classified Bilateral OA of hip, bilateral OA of knee, pain in R hip, pain in L hip  THERAPY DIAG:  Bilateral hip pain  Pain in both knees, unspecified chronicity  Muscle weakness (generalized)  Other symptoms and signs involving the musculoskeletal system  Other abnormalities of gait and mobility  Rationale for Evaluation and Treatment: Rehabilitation  ONSET DATE: chronic   SUBJECTIVE:   SUBJECTIVE STATEMENT: Pt reports no pain at entry. Feels more weakness than anything. Minimal soreness after PT sessions.  EVAL: Patient states she just joined the gym a few weeks ago. Then had a problem with her hip. Ruptured disc in back twice. Patient states hip pain for a long time. Shots helped a little in R. R hip is worse than the left. On way to [place]  she had a pain in R glute where she had to go to ER. L hip has some pain as well. Symptoms are worse with prolonged walking. Wants to able to workout again, eager to  get stronger. Had weak abductors in the past. Legs are very weak and has trouble with stairs. L knee bothers her and troubles with squats and lunges.  PERTINENT HISTORY: hypertension, bilateral hip arthritis, atrial fibrillation  PAIN:  Are you having pain? No at rest  PRECAUTIONS: None  WEIGHT BEARING RESTRICTIONS: No  FALLS:  Has patient fallen in last 6 months? No  PLOF: Independent  PATIENT GOALS: get stronger, exercise   OBJECTIVE: (objective measures from initial evaluation unless otherwise dated)  PATIENT SURVEYS:  LEFS  31/80  COGNITION: Overall cognitive status: Within functional limits for tasks assessed     SENSATION: WFL  POSTURE: No Significant postural limitations  PALPATION: Grossly tender bilateral glutes  LOWER EXTREMITY ROM:  Active ROM Right eval Left eval  Hip flexion    Hip extension    Hip abduction    Hip adduction    Hip internal rotation    Hip external rotation    Knee flexion    Knee extension    Ankle dorsiflexion    Ankle plantarflexion    Ankle inversion    Ankle eversion     (Blank rows = not tested) *= pain/symptoms  LOWER EXTREMITY MMT:  MMT Right eval Left eval  Hip flexion 4 4-  Hip extension 3 3  Hip abduction 3- 3  Hip adduction    Hip internal rotation    Hip external rotation    Knee flexion 4+ 4+  Knee extension 4+ 4+  Ankle dorsiflexion    Ankle plantarflexion    Ankle inversion    Ankle eversion     (Blank rows = not tested) *= pain/symptoms    FUNCTIONAL TESTS:  5 times sit to stand: 31.35 seconds with hands on thighs, labored, relies L>RLE, R valgus Stairs: alternating pattern, 7 inch steps, unilateral HHA, L knee pain, decreased concentric and eccentric strength and motor control bilaterally, increased valgus on R  GAIT: Distance walked: 100 feet Assistive device utilized: None Level of assistance: Complete Independence Comments: R truncal lean on R stance, decreased hip extension bilaterally   TODAY'S TREATMENT:                                                                                                                              DATE:   12/25/23 Nustep 4 minutes LE L4 only for dynamic warm up STS from plinth 2 x 10  Long arc quad 2# 2x10 x 5 second holds Step up 4 inch 2 x 10ea Lateral step up 4 inch 2 x 10  Standing hip abd/ext 2# 2x10ea bil Standing alternating march with 2# weights 2x10 Piriformis stretching Bridge x20   12/20/23 Nustep 4 minutes LE only for dynamic warm up STS chair with airex 2 x 10  Long  arc quad 2# 10 x 5 second holds Step up 4 inch 2 x 10 Lateral step up 4 inch 2 x 10  SLS with 3 way hip 5  x 5 second holds Standing alternating march with glute set 1 x 10  12/18/23 Prone hip extension 2 x 10 Sidelying hip abduction 2 x 10 STS chair with airex 2 x 10  Step up 4 inch 2 x 10 Lateral step up 4 inch 2 x 10      PATIENT EDUCATION:  Education details: Patient educated on exam findings, POC, scope of PT, HEP, relevant anatomy and pathology. 12/14/23: HEP Person educated: Patient Education method: Explanation, Demonstration, and Handouts Education comprehension: verbalized understanding, returned demonstration, verbal cues required, and tactile cues required  HOME EXERCISE PROGRAM: Access Code: [email or web address] URL: [email or web address] Date: 11/22/2023 Prepared by: Debria Fang Zaunegger  Exercises - Supine Bridge  - 1 x daily - 7 x weekly - 2 sets - 10 reps - Clamshell  - 1 x daily - 7 x weekly - 2 sets - 10 reps  ASSESSMENT:  CLINICAL IMPRESSION: Felt mild fatigue from nu-step L4 at start of session. Cues required for proper glute engagement with standing hip and/ext. She does demonstrate greater glute med weakness in R hip with standing hip abduction compared to L hip. Good tolerance tfor 2# with open chain tasks.  Trialled piriformis stretch in supine which she felt benefit from-added to HEP. Will continue to progress strengthening as tolerated.    OBJECTIVE IMPAIRMENTS: Abnormal gait, decreased activity tolerance, decreased balance, decreased endurance, decreased mobility, difficulty walking, decreased ROM, decreased strength, increased muscle spasms, impaired flexibility, improper body mechanics, and pain.   ACTIVITY LIMITATIONS: carrying, lifting, bending, standing, squatting, stairs, transfers, locomotion level, and caring for others  PARTICIPATION LIMITATIONS: meal prep, cleaning, laundry, shopping, community activity, and yard work  PERSONAL FACTORS: Time  since onset of injury/illness/exacerbation and 3+ comorbidities: HTN, afib, hx hip and knee pain, hx back pain are also affecting patient's functional outcome.   REHAB POTENTIAL: Good  CLINICAL DECISION MAKING: Evolving/moderate complexity  EVALUATION COMPLEXITY: Moderate   GOALS: Goals reviewed with patient? Yes  SHORT TERM GOALS: Target date: 12/20/2023    Patient will be independent with HEP in order to improve functional outcomes. Baseline: Goal status: INITIAL  2.  Patient will report at least 25% improvement in symptoms for improved quality of life. Baseline: Goal status: INITIAL    LONG TERM GOALS: Target date: 01/17/2024    Patient will report at least 75% improvement in symptoms for improved quality of life. Baseline:  Goal status: INITIAL  2.  Patient will improve LEFS score by at least 9 points in order to indicate improved tolerance to activity. Baseline: 31/80 Goal status: INITIAL  3.  Patient will be able to navigate stairs with reciprocal pattern without compensation in order to demonstrate improved LE strength. Baseline:  Goal status: INITIAL  4.  Patient will demonstrate grade of 5/5 MMT grade in all tested musculature as evidence of improved strength to assist with stair ambulation and gait.  Baseline:  Goal status: INITIAL  5.  Patient will be able to complete 5x STS in under 15 seconds in order to demo improved functional strength. Baseline:  Goal status: INITIAL  6.  Patient will be able to perform full gym routine independently.  Baseline:  Goal status: INITIAL   PLAN:  PT FREQUENCY: 2x/week  PT DURATION: 8 weeks  PLANNED INTERVENTIONS: [id number]- PT Re-evaluation, 97110-Therapeutic exercises, 97530- Therapeutic activity, [id number]- Neuromuscular re-education, 97535- Self Care, [id number]- Manual therapy, [phone]- Gait training, [phone]- Orthotic Fit/training, [phone]- Canalith repositioning, [id number]- Aquatic Therapy, [phone]- Splinting, Patient/Family  education, Balance training, Stair training, Taping, Dry Needling,  Joint mobilization, Joint manipulation, Spinal manipulation, Spinal mobilization, Scar mobilization, and DME instructions.  PLAN FOR NEXT SESSION: f/u with HEP, glute and quad strength, functional strength, progress to gym routine as able    Judie Noun, PTA, 12/25/2023, 4:58 PM

## 2023-12-27 ENCOUNTER — Ambulatory Visit (HOSPITAL_BASED_OUTPATIENT_CLINIC_OR_DEPARTMENT_OTHER): Admitting: Physical Therapy

## 2023-12-27 ENCOUNTER — Encounter (HOSPITAL_BASED_OUTPATIENT_CLINIC_OR_DEPARTMENT_OTHER): Payer: Self-pay | Admitting: Physical Therapy

## 2023-12-27 DIAGNOSIS — R2689 Other abnormalities of gait and mobility: Secondary | ICD-10-CM | POA: Diagnosis not present

## 2023-12-27 DIAGNOSIS — M25561 Pain in right knee: Secondary | ICD-10-CM | POA: Diagnosis not present

## 2023-12-27 DIAGNOSIS — M25551 Pain in right hip: Secondary | ICD-10-CM | POA: Diagnosis not present

## 2023-12-27 DIAGNOSIS — M25552 Pain in left hip: Secondary | ICD-10-CM | POA: Diagnosis not present

## 2023-12-27 DIAGNOSIS — M6281 Muscle weakness (generalized): Secondary | ICD-10-CM | POA: Diagnosis not present

## 2023-12-27 DIAGNOSIS — R29898 Other symptoms and signs involving the musculoskeletal system: Secondary | ICD-10-CM

## 2023-12-27 DIAGNOSIS — M25562 Pain in left knee: Secondary | ICD-10-CM | POA: Diagnosis not present

## 2023-12-27 NOTE — Therapy (Signed)
 OUTPATIENT PHYSICAL THERAPY TREATMENT   Patient Name: [name] MRN: [id number] DOB:10-13-1945, 78 y.o., female Today's Date: 12/27/2023  END OF SESSION:  PT End of Session - 12/27/23 1308     Visit Number 6    Number of Visits 16    Date for PT Re-Evaluation 01/17/24    Authorization Type MEDICARE    Progress Note Due on Visit 10    PT Start Time 1305    PT Stop Time 1345    PT Time Calculation (min) 40 min    Activity Tolerance Patient tolerated treatment well    Behavior During Therapy WFL for tasks assessed/performed               Past Medical History:  Diagnosis Date   ARTHRITIS, HIPS, BILATERAL    Atrial fibrillation (HCC)    chronic anticoag - Pradaxa    BURSITIS, SUBTROCHANTERIC    HYPERTENSION    MITRAL VALVE PROLAPSE    Mixed hyperlipidemia    Past Surgical History:  Procedure Laterality Date   DILATION AND CURETTAGE OF UTERUS     TONSILLECTOMY     Patient Active Problem List   Diagnosis Date Noted   Wrist pain, acute, right 11/30/2023   Thyroid  nodule 11/29/2023   Fall 11/29/2023   Head trauma 11/29/2023   Anxiety 10/11/2023   Memory impairment 10/03/2023   Intermittent right-sided chest pain 07/25/2023   Pruritus - legs 01/22/2023   Vitamin D  deficiency 01/22/2022   Secondary hypercoagulable state (HCC) 11/17/2020   Nasal lesion 11/08/2020   B12 deficiency 10/28/2019   IBS (irritable bowel syndrome) 10/28/2019   Arthritis of right hand 07/22/2019   Osteoarthritis of hand 06/05/2019   Presbycusis of both ears 05/14/2018   ETD (Eustachian tube dysfunction), bilateral 04/12/2018   Prediabetes 05/15/2017   Pain of right heel 01/09/2017   Primary osteoarthritis of both feet 07/10/2016   Primary osteoarthritis of both hands 07/10/2016   Alopecia totalis 06/05/2016   Dermatochalasis of both upper eyelids 10/12/2015   H/O dysplastic nevus 08/03/2015   Degenerative lumbar disc 12/04/2013   Hematuria, undiagnosed cause 09/02/2013    Osteopenia 06/11/2013   Primary generalized (osteo)arthritis 12/18/2011   Mixed hyperlipidemia 03/25/2009   Essential hypertension 03/24/2009   ATRIAL FIBRILLATION 03/24/2009   Greater trochanteric bursitis of left hip 02/12/2007    PCP: Colene Dauphin, MD  REFERRING PROVIDER: Clyde Darling, DO  REFERRING DIAG: [phone] (ICD-10-CM) - Pain in left hip R26.2 (ICD-10-CM) - Difficulty in walking, not elsewhere classified Bilateral OA of hip, bilateral OA of knee, pain in R hip, pain in L hip  THERAPY DIAG:  Bilateral hip pain  Pain in both knees, unspecified chronicity  Muscle weakness (generalized)  Other symptoms and signs involving the musculoskeletal system  Other abnormalities of gait and mobility  Rationale for Evaluation and Treatment: Rehabilitation  ONSET DATE: chronic   SUBJECTIVE:   SUBJECTIVE STATEMENT: Pt reports doing well today. Hips have been doing alright.   EVAL: Patient states she just joined the gym a few weeks ago. Then had a problem with her hip. Ruptured disc in back twice. Patient states hip pain for a long time. Shots helped a little in R. R hip is worse than the left. On way to [place]  she had a pain in R glute where she had to go to ER. L hip has some pain as well. Symptoms are worse with prolonged walking. Wants to able to workout again, eager to get stronger. Had weak abductors  in the past. Legs are very weak and has trouble with stairs. L knee bothers her and troubles with squats and lunges.  PERTINENT HISTORY: hypertension, bilateral hip arthritis, atrial fibrillation  PAIN:  Are you having pain? No at rest  PRECAUTIONS: None  WEIGHT BEARING RESTRICTIONS: No  FALLS:  Has patient fallen in last 6 months? No  PLOF: Independent  PATIENT GOALS: get stronger, exercise   OBJECTIVE: (objective measures from initial evaluation unless otherwise dated)  PATIENT SURVEYS:  LEFS 31/80  COGNITION: Overall cognitive status: Within functional  limits for tasks assessed     SENSATION: WFL  POSTURE: No Significant postural limitations  PALPATION: Grossly tender bilateral glutes  LOWER EXTREMITY ROM:  Active ROM Right eval Left eval  Hip flexion    Hip extension    Hip abduction    Hip adduction    Hip internal rotation    Hip external rotation    Knee flexion    Knee extension    Ankle dorsiflexion    Ankle plantarflexion    Ankle inversion    Ankle eversion     (Blank rows = not tested) *= pain/symptoms  LOWER EXTREMITY MMT:  MMT Right eval Left eval  Hip flexion 4 4-  Hip extension 3 3  Hip abduction 3- 3  Hip adduction    Hip internal rotation    Hip external rotation    Knee flexion 4+ 4+  Knee extension 4+ 4+  Ankle dorsiflexion    Ankle plantarflexion    Ankle inversion    Ankle eversion     (Blank rows = not tested) *= pain/symptoms    FUNCTIONAL TESTS:  5 times sit to stand: 31.35 seconds with hands on thighs, labored, relies L>RLE, R valgus Stairs: alternating pattern, 7 inch steps, unilateral HHA, L knee pain, decreased concentric and eccentric strength and motor control bilaterally, increased valgus on R  GAIT: Distance walked: 100 feet Assistive device utilized: None Level of assistance: Complete Independence Comments: R truncal lean on R stance, decreased hip extension bilaterally   TODAY'S TREATMENT:                                                                                                                              DATE:  12/27/23 Nustep 4 minutes LE L4 only for dynamic warm up Step up 6 inch 2 x 10  Lateral step up 6 inch 2 x 10  Standing hip abduction RTB at knees 2 x 10 Standing hip extension RTB at knees 2 x 10 Standing cone taps 3 x 10 NBOS on airex 2 x 30 seconds Semi tandem on airex 2 x 30 second holds   12/25/23 Nustep 4 minutes LE L4 only for dynamic warm up STS from plinth 2 x 10  Long arc quad 2# 2x10 x 5 second holds Step up 4 inch 2 x 10ea Lateral  step up 4 inch 2 x 10  Standing hip abd/ext 2#  2x10ea bil Standing alternating march with 2# weights 2x10 Piriformis stretching Bridge x20   12/20/23 Nustep 4 minutes LE only for dynamic warm up STS chair with airex 2 x 10  Long arc quad 2# 10 x 5 second holds Step up 4 inch 2 x 10 Lateral step up 4 inch 2 x 10  SLS with 3 way hip 5 x 5 second holds Standing alternating march with glute set 1 x 10  12/18/23 Prone hip extension 2 x 10 Sidelying hip abduction 2 x 10 STS chair with airex 2 x 10  Step up 4 inch 2 x 10 Lateral step up 4 inch 2 x 10      PATIENT EDUCATION:  Education details: Patient educated on exam findings, POC, scope of PT, HEP, relevant anatomy and pathology. 12/14/23: HEP Person educated: Patient Education method: Explanation, Demonstration, and Handouts Education comprehension: verbalized understanding, returned demonstration, verbal cues required, and tactile cues required  HOME EXERCISE PROGRAM: Access Code: [email or web address] URL: [email or web address] Date: 11/22/2023 Prepared by: Debria Fang Brick Ketcher  Exercises - Supine Bridge  - 1 x daily - 7 x weekly - 2 sets - 10 reps - Clamshell  - 1 x daily - 7 x weekly - 2 sets - 10 reps  ASSESSMENT:  CLINICAL IMPRESSION: Continued with glute and functional strengthening which is tolerated well. Progressed step height and resistance today. Began additional balance training today with reducing UE support as able. Patient will continue to benefit from physical therapy in order to improve function and reduce impairment.    OBJECTIVE IMPAIRMENTS: Abnormal gait, decreased activity tolerance, decreased balance, decreased endurance, decreased mobility, difficulty walking, decreased ROM, decreased strength, increased muscle spasms, impaired flexibility, improper body mechanics, and pain.   ACTIVITY LIMITATIONS: carrying, lifting, bending, standing, squatting, stairs, transfers, locomotion level, and caring for  others  PARTICIPATION LIMITATIONS: meal prep, cleaning, laundry, shopping, community activity, and yard work  PERSONAL FACTORS: Time since onset of injury/illness/exacerbation and 3+ comorbidities: HTN, afib, hx hip and knee pain, hx back pain are also affecting patient's functional outcome.   REHAB POTENTIAL: Good  CLINICAL DECISION MAKING: Evolving/moderate complexity  EVALUATION COMPLEXITY: Moderate   GOALS: Goals reviewed with patient? Yes  SHORT TERM GOALS: Target date: 12/20/2023    Patient will be independent with HEP in order to improve functional outcomes. Baseline: Goal status: INITIAL  2.  Patient will report at least 25% improvement in symptoms for improved quality of life. Baseline: Goal status: INITIAL    LONG TERM GOALS: Target date: 01/17/2024    Patient will report at least 75% improvement in symptoms for improved quality of life. Baseline:  Goal status: INITIAL  2.  Patient will improve LEFS score by at least 9 points in order to indicate improved tolerance to activity. Baseline: 31/80 Goal status: INITIAL  3.  Patient will be able to navigate stairs with reciprocal pattern without compensation in order to demonstrate improved LE strength. Baseline:  Goal status: INITIAL  4.  Patient will demonstrate grade of 5/5 MMT grade in all tested musculature as evidence of improved strength to assist with stair ambulation and gait.  Baseline:  Goal status: INITIAL  5.  Patient will be able to complete 5x STS in under 15 seconds in order to demo improved functional strength. Baseline:  Goal status: INITIAL  6.  Patient will be able to perform full gym routine independently.  Baseline:  Goal status: INITIAL   PLAN:  PT FREQUENCY: 2x/week  PT DURATION: 8 weeks  PLANNED INTERVENTIONS:  29518- PT Re-evaluation, 97110-Therapeutic exercises, 97530- Therapeutic activity, [id number]- Neuromuscular re-education, [phone]- Self Care, 06301- Manual therapy, [phone]- Gait  training, [phone]- Orthotic Fit/training, [phone]- Canalith repositioning, [id number]- Aquatic Therapy, [phone]- Splinting, Patient/Family education, Balance training, Stair training, Taping, Dry Needling, Joint mobilization, Joint manipulation, Spinal manipulation, Spinal mobilization, Scar mobilization, and DME instructions.  PLAN FOR NEXT SESSION: f/u with HEP, glute and quad strength, functional strength, progress to gym routine as able    Beather Liming, PT,DPT 12/27/2023, 1:09 PM

## 2023-12-28 ENCOUNTER — Ambulatory Visit
Admission: RE | Admit: 2023-12-28 | Discharge: 2023-12-28 | Disposition: A | Discharge status: Home / Self Care | Source: Ambulatory Visit | Attending: Sports Medicine | Admitting: Sports Medicine

## 2023-12-28 ENCOUNTER — Other Ambulatory Visit (HOSPITAL_COMMUNITY)
Admission: RE | Admit: 2023-12-28 | Discharge: 2023-12-28 | Disposition: A | Discharge status: Home / Self Care | Source: Ambulatory Visit | Attending: Radiology | Admitting: Radiology

## 2023-12-28 ENCOUNTER — Ambulatory Visit
Admission: RE | Admit: 2023-12-28 | Discharge: 2023-12-28 | Disposition: A | Discharge status: Home / Self Care | Source: Ambulatory Visit | Attending: Otolaryngology | Admitting: Otolaryngology

## 2023-12-28 DIAGNOSIS — M19042 Primary osteoarthritis, left hand: Secondary | ICD-10-CM | POA: Diagnosis not present

## 2023-12-28 DIAGNOSIS — I10 Essential (primary) hypertension: Secondary | ICD-10-CM | POA: Diagnosis not present

## 2023-12-28 DIAGNOSIS — M79641 Pain in right hand: Secondary | ICD-10-CM | POA: Diagnosis not present

## 2023-12-28 DIAGNOSIS — I4891 Unspecified atrial fibrillation: Secondary | ICD-10-CM | POA: Diagnosis not present

## 2023-12-28 DIAGNOSIS — E041 Nontoxic single thyroid nodule: Secondary | ICD-10-CM | POA: Insufficient documentation

## 2023-12-28 DIAGNOSIS — M19041 Primary osteoarthritis, right hand: Secondary | ICD-10-CM

## 2024-01-01 ENCOUNTER — Ambulatory Visit: Payer: Medicare Other | Admitting: Physician Assistant

## 2024-01-02 ENCOUNTER — Ambulatory Visit (HOSPITAL_BASED_OUTPATIENT_CLINIC_OR_DEPARTMENT_OTHER)

## 2024-01-02 ENCOUNTER — Encounter (HOSPITAL_BASED_OUTPATIENT_CLINIC_OR_DEPARTMENT_OTHER): Payer: Self-pay

## 2024-01-02 DIAGNOSIS — M25551 Pain in right hip: Secondary | ICD-10-CM | POA: Diagnosis not present

## 2024-01-02 DIAGNOSIS — R2689 Other abnormalities of gait and mobility: Secondary | ICD-10-CM | POA: Diagnosis not present

## 2024-01-02 DIAGNOSIS — M25562 Pain in left knee: Secondary | ICD-10-CM | POA: Diagnosis not present

## 2024-01-02 DIAGNOSIS — M25561 Pain in right knee: Secondary | ICD-10-CM | POA: Diagnosis not present

## 2024-01-02 DIAGNOSIS — R29898 Other symptoms and signs involving the musculoskeletal system: Secondary | ICD-10-CM

## 2024-01-02 DIAGNOSIS — M6281 Muscle weakness (generalized): Secondary | ICD-10-CM | POA: Diagnosis not present

## 2024-01-02 DIAGNOSIS — M25552 Pain in left hip: Secondary | ICD-10-CM | POA: Diagnosis not present

## 2024-01-02 LAB — CYTOLOGY - NON PAP

## 2024-01-02 NOTE — Therapy (Signed)
 OUTPATIENT PHYSICAL THERAPY TREATMENT   Patient Name: [name] MRN: [id number] DOB:01/30/1946, 78 y.o., female Today's Date: 01/02/2024  END OF SESSION:  PT End of Session - 01/02/24 0948     Visit Number 7    Number of Visits 16    Date for PT Re-Evaluation 01/17/24    Authorization Type MEDICARE    Progress Note Due on Visit 10    PT Start Time [phone]    PT Stop Time 1017    PT Time Calculation (min) 34 min    Activity Tolerance Patient tolerated treatment well    Behavior During Therapy WFL for tasks assessed/performed                Past Medical History:  Diagnosis Date   ARTHRITIS, HIPS, BILATERAL    Atrial fibrillation (HCC)    chronic anticoag - Pradaxa    BURSITIS, SUBTROCHANTERIC    HYPERTENSION    MITRAL VALVE PROLAPSE    Mixed hyperlipidemia    Past Surgical History:  Procedure Laterality Date   DILATION AND CURETTAGE OF UTERUS     TONSILLECTOMY     Patient Active Problem List   Diagnosis Date Noted   Wrist pain, acute, right 11/30/2023   Thyroid  nodule 11/29/2023   Fall 11/29/2023   Head trauma 11/29/2023   Anxiety 10/11/2023   Memory impairment 10/03/2023   Intermittent right-sided chest pain 07/25/2023   Pruritus - legs 01/22/2023   Vitamin D  deficiency 01/22/2022   Secondary hypercoagulable state (HCC) 11/17/2020   Nasal lesion 11/08/2020   B12 deficiency 10/28/2019   IBS (irritable bowel syndrome) 10/28/2019   Arthritis of right hand 07/22/2019   Osteoarthritis of hand 06/05/2019   Presbycusis of both ears 05/14/2018   ETD (Eustachian tube dysfunction), bilateral 04/12/2018   Prediabetes 05/15/2017   Pain of right heel 01/09/2017   Primary osteoarthritis of both feet 07/10/2016   Primary osteoarthritis of both hands 07/10/2016   Alopecia totalis 06/05/2016   Dermatochalasis of both upper eyelids 10/12/2015   H/O dysplastic nevus 08/03/2015   Degenerative lumbar disc 12/04/2013   Hematuria, undiagnosed cause 09/02/2013    Osteopenia 06/11/2013   Primary generalized (osteo)arthritis 12/18/2011   Mixed hyperlipidemia 03/25/2009   Essential hypertension 03/24/2009   ATRIAL FIBRILLATION 03/24/2009   Greater trochanteric bursitis of left hip 02/12/2007    PCP: Colene Dauphin, MD  REFERRING PROVIDER: Clyde Darling, DO  REFERRING DIAG: [phone] (ICD-10-CM) - Pain in left hip R26.2 (ICD-10-CM) - Difficulty in walking, not elsewhere classified Bilateral OA of hip, bilateral OA of knee, pain in R hip, pain in L hip  THERAPY DIAG:  Bilateral hip pain  Muscle weakness (generalized)  Pain in both knees, unspecified chronicity  Other symptoms and signs involving the musculoskeletal system  Other abnormalities of gait and mobility  Rationale for Evaluation and Treatment: Rehabilitation  ONSET DATE: chronic   SUBJECTIVE:   SUBJECTIVE STATEMENT: Pt reports no pain at entry. Had some weakness with balance exercises last visit.   EVAL: Patient states she just joined the gym a few weeks ago. Then had a problem with her hip. Ruptured disc in back twice. Patient states hip pain for a long time. Shots helped a little in R. R hip is worse than the left. On way to [place]  she had a pain in R glute where she had to go to ER. L hip has some pain as well. Symptoms are worse with prolonged walking. Wants to able to workout again, eager to  get stronger. Had weak abductors in the past. Legs are very weak and has trouble with stairs. L knee bothers her and troubles with squats and lunges.  PERTINENT HISTORY: hypertension, bilateral hip arthritis, atrial fibrillation  PAIN:  Are you having pain? No at rest  PRECAUTIONS: None  WEIGHT BEARING RESTRICTIONS: No  FALLS:  Has patient fallen in last 6 months? No  PLOF: Independent  PATIENT GOALS: get stronger, exercise   OBJECTIVE: (objective measures from initial evaluation unless otherwise dated)  PATIENT SURVEYS:  LEFS 31/80  COGNITION: Overall cognitive  status: Within functional limits for tasks assessed     SENSATION: WFL  POSTURE: No Significant postural limitations  PALPATION: Grossly tender bilateral glutes  LOWER EXTREMITY ROM:  Active ROM Right eval Left eval  Hip flexion    Hip extension    Hip abduction    Hip adduction    Hip internal rotation    Hip external rotation    Knee flexion    Knee extension    Ankle dorsiflexion    Ankle plantarflexion    Ankle inversion    Ankle eversion     (Blank rows = not tested) *= pain/symptoms  LOWER EXTREMITY MMT:  MMT Right eval Left eval  Hip flexion 4 4-  Hip extension 3 3  Hip abduction 3- 3  Hip adduction    Hip internal rotation    Hip external rotation    Knee flexion 4+ 4+  Knee extension 4+ 4+  Ankle dorsiflexion    Ankle plantarflexion    Ankle inversion    Ankle eversion     (Blank rows = not tested) *= pain/symptoms    FUNCTIONAL TESTS:  5 times sit to stand: 31.35 seconds with hands on thighs, labored, relies L>RLE, R valgus Stairs: alternating pattern, 7 inch steps, unilateral HHA, L knee pain, decreased concentric and eccentric strength and motor control bilaterally, increased valgus on R  GAIT: Distance walked: 100 feet Assistive device utilized: None Level of assistance: Complete Independence Comments: R truncal lean on R stance, decreased hip extension bilaterally   TODAY'S TREATMENT:                                                                                                                              DATE:    01/02/24 Nustep 5 minutes LE L4 only for dynamic warm up Step up 6 inch 2 x 10  Lateral step up 6 inch 2 x 10  Standing hip abduction RTB at knees 2 x 10 Standing cone taps 3 x 10 NBOS on airex 2 x 30 seconds NBOS airex with head nods Semi tandem on airex 2 x 30 second holds  12/27/23 Nustep 4 minutes LE L4 only for dynamic warm up Step up 6 inch 2 x 10  Lateral step up 6 inch 2 x 10  Standing hip abduction RTB at  knees 2 x 10 Standing hip extension RTB at knees 2 x  10 Standing cone taps 3 x 10 NBOS on airex 2 x 30 seconds Semi tandem on airex 2 x 30 second holds   12/25/23 Nustep 4 minutes LE L4 only for dynamic warm up STS from plinth 2 x 10  Long arc quad 2# 2x10 x 5 second holds Step up 4 inch 2 x 10ea Lateral step up 4 inch 2 x 10  Standing hip abd/ext 2# 2x10ea bil Standing alternating march with 2# weights 2x10 Piriformis stretching Bridge x20   12/20/23 Nustep 4 minutes LE only for dynamic warm up STS chair with airex 2 x 10  Long arc quad 2# 10 x 5 second holds Step up 4 inch 2 x 10 Lateral step up 4 inch 2 x 10  SLS with 3 way hip 5 x 5 second holds Standing alternating march with glute set 1 x 10  12/18/23 Prone hip extension 2 x 10 Sidelying hip abduction 2 x 10 STS chair with airex 2 x 10  Step up 4 inch 2 x 10 Lateral step up 4 inch 2 x 10      PATIENT EDUCATION:  Education details: Patient educated on exam findings, POC, scope of PT, HEP, relevant anatomy and pathology. 12/14/23: HEP Person educated: Patient Education method: Explanation, Demonstration, and Handouts Education comprehension: verbalized understanding, returned demonstration, verbal cues required, and tactile cues required  HOME EXERCISE PROGRAM: Access Code: [email or web address] URL: [email or web address] Date: 11/22/2023 Prepared by: Debria Fang Zaunegger  Exercises - Supine Bridge  - 1 x daily - 7 x weekly - 2 sets - 10 reps - Clamshell  - 1 x daily - 7 x weekly - 2 sets - 10 reps  ASSESSMENT:  CLINICAL IMPRESSION: Pt challenged by balance activity progressions. She demonstrates unsteadiness with tasks, but able to self correct without LOB. Required cuing with resisted standing hip abduction for proper posture and technique. Had to loosen band slightly to reduce difficulty. Pt has difficulty avoiding trunk lean and trendelenberg with this.    OBJECTIVE IMPAIRMENTS: Abnormal gait, decreased  activity tolerance, decreased balance, decreased endurance, decreased mobility, difficulty walking, decreased ROM, decreased strength, increased muscle spasms, impaired flexibility, improper body mechanics, and pain.   ACTIVITY LIMITATIONS: carrying, lifting, bending, standing, squatting, stairs, transfers, locomotion level, and caring for others  PARTICIPATION LIMITATIONS: meal prep, cleaning, laundry, shopping, community activity, and yard work  PERSONAL FACTORS: Time since onset of injury/illness/exacerbation and 3+ comorbidities: HTN, afib, hx hip and knee pain, hx back pain are also affecting patient's functional outcome.   REHAB POTENTIAL: Good  CLINICAL DECISION MAKING: Evolving/moderate complexity  EVALUATION COMPLEXITY: Moderate   GOALS: Goals reviewed with patient? Yes  SHORT TERM GOALS: Target date: 12/20/2023    Patient will be independent with HEP in order to improve functional outcomes. Baseline: Goal status: INITIAL  2.  Patient will report at least 25% improvement in symptoms for improved quality of life. Baseline: Goal status: INITIAL    LONG TERM GOALS: Target date: 01/17/2024    Patient will report at least 75% improvement in symptoms for improved quality of life. Baseline:  Goal status: INITIAL  2.  Patient will improve LEFS score by at least 9 points in order to indicate improved tolerance to activity. Baseline: 31/80 Goal status: INITIAL  3.  Patient will be able to navigate stairs with reciprocal pattern without compensation in order to demonstrate improved LE strength. Baseline:  Goal status: INITIAL  4.  Patient will demonstrate grade of 5/5 MMT grade in all tested musculature as evidence  of improved strength to assist with stair ambulation and gait.  Baseline:  Goal status: INITIAL  5.  Patient will be able to complete 5x STS in under 15 seconds in order to demo improved functional strength. Baseline:  Goal status: INITIAL  6.  Patient  will be able to perform full gym routine independently.  Baseline:  Goal status: INITIAL   PLAN:  PT FREQUENCY: 2x/week  PT DURATION: 8 weeks  PLANNED INTERVENTIONS: 97164- PT Re-evaluation, 97110-Therapeutic exercises, 97530- Therapeutic activity, 97112- Neuromuscular re-education, 97535- Self Care, 16109- Manual therapy, [phone]- Gait training, [phone]- Orthotic Fit/training, [phone]- Canalith repositioning, [id number]- Aquatic Therapy, [phone]- Splinting, Patient/Family education, Balance training, Stair training, Taping, Dry Needling, Joint mobilization, Joint manipulation, Spinal manipulation, Spinal mobilization, Scar mobilization, and DME instructions.  PLAN FOR NEXT SESSION: f/u with HEP, glute and quad strength, functional strength, progress to gym routine as able    Judie Noun, PTA 01/02/2024, 12:49 PM

## 2024-01-03 ENCOUNTER — Other Ambulatory Visit: Payer: Self-pay | Admitting: Internal Medicine

## 2024-01-04 ENCOUNTER — Encounter (HOSPITAL_BASED_OUTPATIENT_CLINIC_OR_DEPARTMENT_OTHER): Payer: Self-pay

## 2024-01-04 ENCOUNTER — Ambulatory Visit (HOSPITAL_BASED_OUTPATIENT_CLINIC_OR_DEPARTMENT_OTHER)

## 2024-01-04 DIAGNOSIS — M25551 Pain in right hip: Secondary | ICD-10-CM

## 2024-01-04 DIAGNOSIS — R2689 Other abnormalities of gait and mobility: Secondary | ICD-10-CM

## 2024-01-04 DIAGNOSIS — M25561 Pain in right knee: Secondary | ICD-10-CM

## 2024-01-04 DIAGNOSIS — R29898 Other symptoms and signs involving the musculoskeletal system: Secondary | ICD-10-CM

## 2024-01-04 DIAGNOSIS — M25552 Pain in left hip: Secondary | ICD-10-CM | POA: Diagnosis not present

## 2024-01-04 DIAGNOSIS — M6281 Muscle weakness (generalized): Secondary | ICD-10-CM

## 2024-01-04 DIAGNOSIS — M25562 Pain in left knee: Secondary | ICD-10-CM | POA: Diagnosis not present

## 2024-01-04 NOTE — Therapy (Signed)
 OUTPATIENT PHYSICAL THERAPY TREATMENT   Patient Name: [name] MRN: [id number] DOB:[birth year], 78 y.o., female Today's Date: 01/04/2024  END OF SESSION:  PT End of Session - 01/04/24 1150     Visit Number 8    Number of Visits 16    Date for PT Re-Evaluation 01/17/24    Authorization Type MEDICARE    Progress Note Due on Visit 10    PT Start Time 1148    PT Stop Time 1230    PT Time Calculation (min) 42 min    Activity Tolerance Patient tolerated treatment well    Behavior During Therapy WFL for tasks assessed/performed                 Past Medical History:  Diagnosis Date   ARTHRITIS, HIPS, BILATERAL    Atrial fibrillation (HCC)    chronic anticoag - Pradaxa    BURSITIS, SUBTROCHANTERIC    HYPERTENSION    MITRAL VALVE PROLAPSE    Mixed hyperlipidemia    Past Surgical History:  Procedure Laterality Date   DILATION AND CURETTAGE OF UTERUS     TONSILLECTOMY     Patient Active Problem List   Diagnosis Date Noted   Wrist pain, acute, right 11/30/2023   Thyroid  nodule 11/29/2023   Fall 11/29/2023   Head trauma 11/29/2023   Anxiety 10/11/2023   Memory impairment 10/03/2023   Intermittent right-sided chest pain 07/25/2023   Pruritus - legs 01/22/2023   Vitamin D  deficiency 01/22/2022   Secondary hypercoagulable state (HCC) 11/17/2020   Nasal lesion 11/08/2020   B12 deficiency 10/28/2019   IBS (irritable bowel syndrome) 10/28/2019   Arthritis of right hand 07/22/2019   Osteoarthritis of hand 06/05/2019   Presbycusis of both ears 05/14/2018   ETD (Eustachian tube dysfunction), bilateral 04/12/2018   Prediabetes 05/15/2017   Pain of right heel 01/09/2017   Primary osteoarthritis of both feet 07/10/2016   Primary osteoarthritis of both hands 07/10/2016   Alopecia totalis 06/05/2016   Dermatochalasis of both upper eyelids 10/12/2015   H/O dysplastic nevus 08/03/2015   Degenerative lumbar disc 12/04/2013   Hematuria, undiagnosed cause 09/02/2013    Osteopenia 06/11/2013   Primary generalized (osteo)arthritis 12/18/2011   Mixed hyperlipidemia 03/25/2009   Essential hypertension 03/24/2009   ATRIAL FIBRILLATION 03/24/2009   Greater trochanteric bursitis of left hip 02/12/2007    PCP: Colene Dauphin, MD  REFERRING PROVIDER: Clyde Darling, DO  REFERRING DIAG: [phone] (ICD-10-CM) - Pain in left hip R26.2 (ICD-10-CM) - Difficulty in walking, not elsewhere classified Bilateral OA of hip, bilateral OA of knee, pain in R hip, pain in L hip  THERAPY DIAG:  Bilateral hip pain  Muscle weakness (generalized)  Pain in both knees, unspecified chronicity  Other symptoms and signs involving the musculoskeletal system  Other abnormalities of gait and mobility  Rationale for Evaluation and Treatment: Rehabilitation  ONSET DATE: chronic   SUBJECTIVE:   SUBJECTIVE STATEMENT: Pt reports some soreness after PT session, but in a good way.   EVAL: Patient states she just joined the gym a few weeks ago. Then had a problem with her hip. Ruptured disc in back twice. Patient states hip pain for a long time. Shots helped a little in R. R hip is worse than the left. On way to [place]  she had a pain in R glute where she had to go to ER. L hip has some pain as well. Symptoms are worse with prolonged walking. Wants to able to workout again, eager to get  stronger. Had weak abductors in the past. Legs are very weak and has trouble with stairs. L knee bothers her and troubles with squats and lunges.  PERTINENT HISTORY: hypertension, bilateral hip arthritis, atrial fibrillation  PAIN:  Are you having pain? No at rest  PRECAUTIONS: None  WEIGHT BEARING RESTRICTIONS: No  FALLS:  Has patient fallen in last 6 months? No  PLOF: Independent  PATIENT GOALS: get stronger, exercise   OBJECTIVE: (objective measures from initial evaluation unless otherwise dated)  PATIENT SURVEYS:  LEFS 31/80  COGNITION: Overall cognitive status: Within  functional limits for tasks assessed     SENSATION: WFL  POSTURE: No Significant postural limitations  PALPATION: Grossly tender bilateral glutes  LOWER EXTREMITY ROM:  Active ROM Right eval Left eval  Hip flexion    Hip extension    Hip abduction    Hip adduction    Hip internal rotation    Hip external rotation    Knee flexion    Knee extension    Ankle dorsiflexion    Ankle plantarflexion    Ankle inversion    Ankle eversion     (Blank rows = not tested) *= pain/symptoms  LOWER EXTREMITY MMT:  MMT Right eval Left eval  Hip flexion 4 4-  Hip extension 3 3  Hip abduction 3- 3  Hip adduction    Hip internal rotation    Hip external rotation    Knee flexion 4+ 4+  Knee extension 4+ 4+  Ankle dorsiflexion    Ankle plantarflexion    Ankle inversion    Ankle eversion     (Blank rows = not tested) *= pain/symptoms    FUNCTIONAL TESTS:  5 times sit to stand: 31.35 seconds with hands on thighs, labored, relies L>RLE, R valgus Stairs: alternating pattern, 7 inch steps, unilateral HHA, L knee pain, decreased concentric and eccentric strength and motor control bilaterally, increased valgus on R  GAIT: Distance walked: 100 feet Assistive device utilized: None Level of assistance: Complete Independence Comments: R truncal lean on R stance, decreased hip extension bilaterally   TODAY'S TREATMENT:                                                                                                                              DATE:   01/04/24 Nustep 5 minutes LE L4 only for dynamic warm up Step up 6 inch 2 x 10  Lateral step up 6 inch 2 x 10  Standing hip abduction/extension RTB at ankles 2 x 10 Sit to stands 2x10  NBOS on airex 2 x 30 seconds Marching on airex (slow) NBOS airex with head turns Semi tandem on airex 2 x 30 second holds ea   01/02/24 Nustep 5 minutes LE L4 only for dynamic warm up Step up 6 inch 2 x 10  Lateral step up 6 inch 2 x 10  Standing  hip abduction RTB at knees 2 x 10 Standing cone taps 3 x  10 NBOS on airex 2 x 30 seconds NBOS airex with head nods Semi tandem on airex 2 x 30 second holds   PATIENT EDUCATION:  Education details: Patient educated on exam findings, POC, scope of PT, HEP, relevant anatomy and pathology. 12/14/23: HEP Person educated: Patient Education method: Explanation, Demonstration, and Handouts Education comprehension: verbalized understanding, returned demonstration, verbal cues required, and tactile cues required  HOME EXERCISE PROGRAM: Access Code: [email or web address] URL: [email or web address] Date: 11/22/2023 Prepared by: Debria Fang Zaunegger  Exercises - Supine Bridge  - 1 x daily - 7 x weekly - 2 sets - 10 reps - Clamshell  - 1 x daily - 7 x weekly - 2 sets - 10 reps  ASSESSMENT:  CLINICAL IMPRESSION: Pt improving with awareness with balance challenges and demonstrates improved control. Some cuing required with STS for initial reps for increased trunk flexion. Pt abl to correct hip orientation with verbal cuing during standing hip abduction with resistance. Will continue to progress as tolerated.     OBJECTIVE IMPAIRMENTS: Abnormal gait, decreased activity tolerance, decreased balance, decreased endurance, decreased mobility, difficulty walking, decreased ROM, decreased strength, increased muscle spasms, impaired flexibility, improper body mechanics, and pain.   ACTIVITY LIMITATIONS: carrying, lifting, bending, standing, squatting, stairs, transfers, locomotion level, and caring for others  PARTICIPATION LIMITATIONS: meal prep, cleaning, laundry, shopping, community activity, and yard work  PERSONAL FACTORS: Time since onset of injury/illness/exacerbation and 3+ comorbidities: HTN, afib, hx hip and knee pain, hx back pain are also affecting patient's functional outcome.   REHAB POTENTIAL: Good  CLINICAL DECISION MAKING: Evolving/moderate complexity  EVALUATION COMPLEXITY:  Moderate   GOALS: Goals reviewed with patient? Yes  SHORT TERM GOALS: Target date: 12/20/2023    Patient will be independent with HEP in order to improve functional outcomes. Baseline: Goal status: INITIAL  2.  Patient will report at least 25% improvement in symptoms for improved quality of life. Baseline: Goal status: INITIAL    LONG TERM GOALS: Target date: 01/17/2024    Patient will report at least 75% improvement in symptoms for improved quality of life. Baseline:  Goal status: INITIAL  2.  Patient will improve LEFS score by at least 9 points in order to indicate improved tolerance to activity. Baseline: 31/80 Goal status: INITIAL  3.  Patient will be able to navigate stairs with reciprocal pattern without compensation in order to demonstrate improved LE strength. Baseline:  Goal status: INITIAL  4.  Patient will demonstrate grade of 5/5 MMT grade in all tested musculature as evidence of improved strength to assist with stair ambulation and gait.  Baseline:  Goal status: INITIAL  5.  Patient will be able to complete 5x STS in under 15 seconds in order to demo improved functional strength. Baseline:  Goal status: INITIAL  6.  Patient will be able to perform full gym routine independently.  Baseline:  Goal status: INITIAL   PLAN:  PT FREQUENCY: 2x/week  PT DURATION: 8 weeks  PLANNED INTERVENTIONS: 97164- PT Re-evaluation, 97110-Therapeutic exercises, 97530- Therapeutic activity, 97112- Neuromuscular re-education, 97535- Self Care, 16109- Manual therapy, [phone]- Gait training, [phone]- Orthotic Fit/training, [phone]- Canalith repositioning, [id number]- Aquatic Therapy, [phone]- Splinting, Patient/Family education, Balance training, Stair training, Taping, Dry Needling, Joint mobilization, Joint manipulation, Spinal manipulation, Spinal mobilization, Scar mobilization, and DME instructions.  PLAN FOR NEXT SESSION: f/u with HEP, glute and quad strength, functional strength,  progress to gym routine as able    Judie Noun, PTA 01/04/2024, 5:00 PM

## 2024-01-07 ENCOUNTER — Ambulatory Visit: Payer: Self-pay

## 2024-01-07 ENCOUNTER — Ambulatory Visit (INDEPENDENT_AMBULATORY_CARE_PROVIDER_SITE_OTHER): Admitting: Psychology

## 2024-01-07 DIAGNOSIS — R4189 Other symptoms and signs involving cognitive functions and awareness: Secondary | ICD-10-CM

## 2024-01-07 NOTE — Progress Notes (Signed)
   Psychometrician Note   Cognitive testing was administered to [name] by Abbott Abbot, B.S. (psychometrist) under the supervision of Dr. Janice Meeter, Psy.D., licensed psychologist on 01/07/2024. [name] did not appear overtly distressed by the testing session per behavioral observation or responses across self-report questionnaires. Rest breaks were offered.    The battery of tests administered was selected by Dr. Janice Meeter, Psy.D. with consideration to [name]'s current level of functioning, the nature of her symptoms, emotional and behavioral responses during interview, level of literacy, observed level of motivation/effort, and the nature of the referral question. This battery was communicated to the psychometrist. Communication between Dr. Janice Meeter, Psy.D. and the psychometrist was ongoing throughout the evaluation and Dr. Janice Meeter, Psy.D. was immediately accessible at all times. Dr. Janice Meeter, Psy.D. provided supervision to the psychometrist on the date of this service to the extent necessary to assure the quality of all services provided.    [name] will return within approximately 1-2 weeks for an interactive feedback session with Dr. Donavon Fudge at which time her test performances, clinical impressions, and treatment recommendations will be reviewed in detail. [name] understands she can contact our office should she require our assistance before this time.  A total of 100 minutes of billable time were spent face-to-face with [name] by the psychometrist. This includes both test administration and scoring time. Billing for these services is reflected in the clinical report generated by Dr. Janice Meeter, Psy.D.  This note reflects time spent with the psychometrician and does not include test scores or any clinical interpretations made by Dr. Donavon Fudge. The full report will follow in a separate note.

## 2024-01-07 NOTE — Progress Notes (Signed)
 NEUROPSYCHOLOGICAL EVALUATION [facility]  [place] Department of Neurology  Date of Evaluation: 01/07/2024  REASON FOR REFERRAL   [name] is a 78 year old, right-handed, White female with 12 years of formal education. She was referred for neuropsychological evaluation by Tex Filbert, PA-C, to assess current neurocognitive functioning, document potential cognitive deficits, and assist with treatment planning. This is her first neuropsychological evaluation.  SUMMARY OF RESULTS   Premorbid cognitive abilities are estimated to be in the high average range based on word reading and sociodemographic factors. Findings today were generally consistent with this estimate, with only a few exceptions.   Performance was below expectations on a task of verbal abstract reasoning, while visual abstract reasoning was intact. Performance was also below expectations on a semantic fluency task, while phonemic fluency was intact. All remaining measures were within age-related expectations, including attention/working memory, processing speed, executive functioning, confrontation naming, visuospatial functioning, and learning/memory.   On self-report questionnaires, she did not endorse clinically-elevated levels of depression, anxiety, or daytime sleepiness.  DIAGNOSTIC IMPRESSION   Results of the current evaluation were generally within expectations, except for low verbal abstract reasoning and semantic fluency. Functional independence is maintained. At this time, there is not enough impairment to warrant a diagnosis of mild cognitive impairment. The cause of these isolated weaknesses is unclear and may simply represent normal individual variability or underlying baseline weaknesses. Should she show increased language difficulties or further cognitive decline, a neuropsychological reevaluation would be advised. Otherwise, subjective cognitive concerns are likely multifactorial and  related to normal aging, persistent fatigue, chronic pain, mild cerebrovascular changes, hearing loss, and possible medication side effects (e.g., tizanidine ). To the extent that modifiable factors can be ameliorated, she may find that her subjective cognitive concerns improve. Results further serve as a baseline for future comparison, should it ever become necessary to re-evaluate the patient.   ICD-10 Codes: R41.89 Cognitive changes  RECOMMENDATIONS   In consultation with your doctor, schedule cognitive reevaluation on an as-needed basis to assess for cognitive decline and update treatment recommendations. Reevaluation should occur during a period of medical and affective stability.  Follow-up with your audiologist about receiving a hearing aid, as declines in hearing can impact functioning through reduced sensory stimulation and greater difficulty with the acquisition of new information.  Continue to prioritize physical health through diet, exercise, and sleep: Regular physical activity supports cardiovascular health, improves mood, and helps preserve mobility and independence. Aim for at least 150 minutes of moderate aerobic exercise per week (e.g., brisk walking, swimming, gardening). A brain-healthy diet such as the Mediterranean or MIND diet is rich in fruits, vegetables, whole grains, healthy fats, and lean proteins, and has been associated with reduced risk of cognitive decline. Additionally, getting adequate, quality sleep and managing chronic conditions with the help of healthcare providers are essential components of healthy aging.  Continue to stay socially and mentally engaged. Maintaining strong social connections and regularly stimulating your brain can help protect against cognitive decline. This includes staying connected with friends and family, volunteering, or participating in community groups. Mentally engaging activities--such as reading, doing puzzles, playing strategy games, or  learning a new language or musical instrument--promote brain plasticity. If you are interested in activities to support cognitive engagement, this site offers a variety of apps and games organized by difficulty level:   https://www.[facility].org/get-to-know-barrow/centers-programs/neurorehabilitation-center/neuro-rehab-apps-and-games/  Consider implementing compensatory strategies to maximize independence and maintain daily functioning. Examples include:   -Adhere to routine. Compensatory strategies work best when they are used consistently.  Use a planner, calendar, or white board that has the schedule and important events for the day clearly listed to reference and cross off when tasks are complete.   -Ask for written information, especially if it is new or unfamiliar (e.g., information provided at a doctor's appointment).   -Create an organized environment. Keep items that can be easily misplaced in a sensible location and get into the habit of always returning the items to those places.   -Pay attention and reduce distractions. Make a point of focusing attention on information you want to remember. One-on-one interaction is more likely to facilitate attention and minimize distraction. Make eye contact and repeat the information out loud after you hear it. Reduce interruptions or distractions especially when attempting to learn new information.   -Create associations. When learning something new, think about and understand the information. Explain it in your own words or try to associate it with something you already know. Take notes to help remember important details.  -Evaluate goals and plan accordingly. When confronted by many different tasks, begin by making a list that prioritizes each task and estimates the time it will take to complete. Break down complicated tasks into smaller, more manageable steps.   -Focus on one task at a time and complete each task before starting another. Avoid  multitasking.  DISPOSITION   Patient will follow up with the referring provider, Ms. Wertman. No follow-up neuropsychological testing was scheduled at this time. Please feel free to refer the patient for repeated evaluation if she shows a significant change in neurocognitive status. She will be provided verbal feedback in approximately one week regarding the findings and impression during this visit.  The remainder of the report includes the details of the patient's background and a table of results from the current evaluation, which support the summary and recommendations described above.  BACKGROUND   History of Presenting Illness: The following information was obtained from a review of medical records and an interview with the patient. Patient established care with neurology on 10/03/2023 due to progressively worsening short-term memory and word-finding difficulties over the past several years. MoCA = 23/30. She was referred for neuropsychological evaluation accordingly.  Cognitive Functioning: During today's appointment, the patient reported experiencing short-term memory and word-finding difficulties similar to those she mentioned at her neurology visit. For example, she may forget details of conversations or specific information about places she has traveled to or vacations she has taken. These symptoms have gradually worsened over the past few years, although she has noticed some recent improvement since starting memantine . She denied major concerns with attention, processing speed, navigation, and executive functioning.  Physical Functioning: Patient denied difficulties with sleep initiation. She wakes a few times during the night to use the restroom but is generally able to fall back asleep without issue. Appetite is stable. Patient reported a slight change in taste, particularly for savory foods, describing some flavors as less enjoyable than before. Her sense of smell remains unchanged.  Vision is stable; she has undergone cataract surgery and uses reading glasses. She is in the process of obtaining glasses for distance. 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 She endorsed difficulties with balance and sustained a recent fall this past April. She is in physical therapy to improve her balance and to strengthen her muscles, as she has reported weakness in her hips, back, and hands due to arthritis. She denied urinary  incontinence. She has noticed a very slight tremor in the pinky finger of her right hand for a long time but has not observed any other tremors.  Emotional Functioning: Patient described her recent mood as "pretty even keeled." She reported that her primary care provider prescribed duloxetine  for anxiety symptoms, but she has not felt anxious, aside from recent changes in cognitive functioning and the need to undergo a neuropsychological evaluation. 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 Patient denied any suicidal ideation. She reported feeling consistently low energy, which at times influences her mood throughout the day. However, she travels regularly--she and her husband recently acquired a trailer and joined a club. She enjoys puzzles, recently signed up at the local gym, and has been attending physical therapy. Her low energy limits her to walking short distances.  Imaging: MRI of the brain (11/07/2023) documented mild chronic small vessel ischemic changes within the cerebral white matter and pons and mild generalized cerebral atrophy.  Other Relevant Medical History: Remarkable for hypertension, hyperlipidemia, atrial fibrillation, prediabetes, osteoarthritis, osteopenia, degenerative lumbar disc, and irritable bowel syndrome. Patient sustained a fall on Easter Sunday this past April, falling on the  porch and landing on her face. She denied loss of consciousness. Neuroimaging was unremarkable for acute abnormalities, although an incidental thyroid  nodule was identified. No history of stroke, CNS infection, or seizure was reported.  Current Medications: Per record, acetaminophen , coenzyme Q10, diltiazem , duloxetine , hydrochlorothiazide , hydrocodone -acetaminophen , Klor-Con , lisinopril , magnesium oxide, memantine , pravastatin , rivaroxaban , tizanidine , and vitamin D3.  Functional Status: Patient independently performs all basic and instrumental (e.g., driving, medication management, appointments) activities of daily living without difficulty. Her husband manages the finances, but this is longstanding. She denied difficulties using household appliances.  Family Neurological History: Unremarkable.  Psychiatric History: Remarkable for recent symptoms of anxiety, for which duloxetine  was recently prescribed by her primary care provider. History of depression, prior mental health treatment, suicidal ideation, hallucinations, and psychiatric hospitalizations was not reported.  Substance Use History: Patient reported infrequent alcohol consumption. Current use of nicotine, marijuana, and illicit substances was denied.  Social and Developmental History: Patient was born in [place], [place]. History of perinatal complications and developmental delays was not reported. Patient is married and lives with her husband. She has one child.  Educational and Occupational History: No history of childhood learning disability, special education services, or grade retention was reported. 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 She completed high school on time and subsequently attended one year of secretarial school. 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 She left her job  to raise her child and never returned to work.  BEHAVIORAL OBSERVATIONS   Patient arrived on time and was unaccompanied. She ambulated independently and without gait disturbance. She was alert and fully oriented. She was appropriately groomed and dressed for the setting. She wore a brace on her right hand, as she continues to recover from the fall. No significant sensory or motor abnormalities were observed. Vision (with glasses) and hearing were adequate for testing purposes. Speech was of normal rate, prosody, and volume. She demonstrated a couple instances of word-finding difficulty, but no paraphasic errors or dysarthria were observed. Comprehension was conversationally intact. Thought processes were linear, logical, and coherent. Thought content was organized and devoid of delusions. Insight  appeared intact. Affect was even and congruent with euthymic mood. She was cooperative and gave adequate effort during testing, including on standalone and embedded measures of performance validity. Results are thought to accurately reflect her cognitive functioning at this time.  NEUROPSYCHOLOGICAL TESTING RESULTS   Tests Administered: Animal Naming Test; Brief Visuospatial Memory Test-Revised (BVMT-R) - Form 1; Controlled Oral Word Association Test (COWAT): FAS; Delis-Kaplan Executive Function System (D-KEFS) - Subtest(s): Color-Word Interference Test; Epworth Sleepiness Scale (ESS); Geriatric Anxiety Scale-10 Item (GAS-10); Geriatric Depression Scale Short Form (GDS-SF); Hopkins Verbal Learning Test Revised (HVLT-R) - From 1; Judgment of Line Orientation (JLO) - Form V; Neuropsychological Assessment Battery (NAB) - Subtest(s): Naming Form 1; Standalone performance validity test (PVT); Test of Premorbid Functioning (TOPF); Trail Making Test (TMT); Wechsler Adult Intelligence Scale Fifth Edition (WAIS-5) - Subtest(s): Similarities, Clinical cytogeneticist, Matrix Reasoning, Digit Sequencing, Running Digits, Symbol Span,  Naming Speed Quantity; and Wechsler Memory Scale Fourth Edition (WMS-IV) - Subtest(s): Logical Memory (LM).  Test results are provided in the table below. Whenever possible, the patient's scores were compared against age-, sex-, and education-corrected normative samples. Interpretive descriptions are based on the AACN consensus conference statement on uniform labeling (Guilmette et al., 2020).  PREMORBID FUNCTIONING RAW  RANGE  TOPF 60 StdS=118 High Average  ATTENTION & WORKING MEMORY RAW  RANGE  WAIS-5 Digit Sequencing -- ss=9 Average  WAIS-5 Running Digits -- ss=10 Average  WAIS-5 Symbol Span -- ss=10 Average  PROCESSING SPEED RAW  RANGE  Trails A 29''1e T=56 Average  WAIS-5 Naming Speed Quantity -- ss=12 High Average  DKEFS CWIT Color Naming 32''1e ss=11 Average  DKEFS CWIT Word Reading 21''0e ss=12 High Average  EXECUTIVE FUNCTION RAW  RANGE  Trails B 96''1e T=52 Average  WAIS-5 Matrix Reasoning -- ss=10 Average  WAIS-5 Similarities -- ss=5 Below Average  COWAT Letter Fluency 6+7+10 T=37 Low Average  DKEFS CWIT Inhibition 99''3e ss=6 Low Average  DKEFS CWIT Inhibition/Switching 63''2e ss=12 High Average  LANGUAGE RAW  RANGE  COWAT Letter Fluency 6+7+10 T=37 Low Average  Animal Naming Test 11 T=34 Below Average  NAB Naming Test 30/31 T=60 WNL  VISUOSPATIAL RAW    WAIS-5 Block Design -- ss=10 Average  JLO C/S=23/30 40%ile Average  BVMT-R Copy Trial 11/12 -- WNL  VERBAL LEARNING & MEMORY RAW  RANGE  HVLT Learning Trials (4+7+10)/36 T=46 Average  HVLT Delayed Recall 8/12 T=49 Average  HVLT Recognition Hits 10 -- --  HVLT Recognition False Positives 1 -- --  HVLT Discrimination Index 9 T=43 Average  WMS-IV LM-I  (9+12+9)/53 ss=10 Average  WMS-IV LM-II  (9+7)/39 ss=10 Average  WMS-IV LM Recognition  (8+13)/23 >75%ile High Average to Exceptionally High  VISUAL LEARNING & MEMORY RAW  RANGE  BVMT-R Total Recall (2+6+7)/36 T=41 Low Average  BVMT-R Delayed Recall 8/12 T=51 Average   BVMT-R Percent Retained 100 >16%ile WNL  BVMT-R Recognition Hits 6 >16%ile WNL  BVMT-R Recognition False Alarms 1 11-16%ile Low Average  BVMT-R Recognition Discrimination Index 5 >16%ile WNL  QUESTIONNAIRES RAW  RANGE  GDS-SF 3 -- Minimal  GAS-10 3 -- Minimal  ESS 5 -- WNL  *Note: ss = scaled score; StdS = standard score; T = t-score; C/S = corrected raw score; WNL = within normal limits; BNL= below normal limits; D/C = discontinued. Scores from skewed distributions are typically interpreted as WNL (>=16th %ile) or BNL (<16th %ile).   INFORMED CONSENT   Patient was provided with a verbal description of the nature and purpose of the neuropsychological evaluation.  Also reviewed were the foreseeable risks and/or discomforts and benefits of the procedure, limits of confidentiality, and mandatory reporting requirements of this provider. Patient was given the opportunity to have their questions answered. Oral consent to participate was provided by the patient.   This report was prepared as part of a clinical evaluation and is not intended for forensic use.  SERVICE   This evaluation was conducted by Janice Meeter, Psy.D. In addition to time spent directly with the patient, total professional time (120 minutes) includes record review, integration of relevant medical history, test selection, interpretation of findings, and report preparation. A technician, Abbott Abbot, B.S., provided testing and scoring assistance for 100 minutes.  Psychiatric Diagnostic Evaluation Services (Professional): 40981 x 1 Neuropsychological Testing Evaluation Services (Professional): [phone] x 1 Neuropsychological Testing Evaluation Services (Professional): [phone] x 1 Neuropsychological Test Administration and Scoring Radiographer, therapeutic): [phone] x 1 Neuropsychological Test Administration and Scoring (Technician): [phone] x 2  This report was generated using voice recognition software. While this document has been carefully  reviewed, transcription errors may be present. I apologize in advance for any inconvenience. Please contact me if further clarification is needed.            Janice Meeter, Psy.D.             Neuropsychologist

## 2024-01-08 ENCOUNTER — Ambulatory Visit (HOSPITAL_BASED_OUTPATIENT_CLINIC_OR_DEPARTMENT_OTHER): Attending: Sports Medicine

## 2024-01-08 ENCOUNTER — Encounter (HOSPITAL_BASED_OUTPATIENT_CLINIC_OR_DEPARTMENT_OTHER): Payer: Self-pay

## 2024-01-08 DIAGNOSIS — M25552 Pain in left hip: Secondary | ICD-10-CM | POA: Insufficient documentation

## 2024-01-08 DIAGNOSIS — M25562 Pain in left knee: Secondary | ICD-10-CM | POA: Insufficient documentation

## 2024-01-08 DIAGNOSIS — R2689 Other abnormalities of gait and mobility: Secondary | ICD-10-CM | POA: Insufficient documentation

## 2024-01-08 DIAGNOSIS — M6281 Muscle weakness (generalized): Secondary | ICD-10-CM | POA: Insufficient documentation

## 2024-01-08 DIAGNOSIS — M25561 Pain in right knee: Secondary | ICD-10-CM | POA: Diagnosis not present

## 2024-01-08 DIAGNOSIS — M25551 Pain in right hip: Secondary | ICD-10-CM | POA: Diagnosis not present

## 2024-01-08 DIAGNOSIS — R29898 Other symptoms and signs involving the musculoskeletal system: Secondary | ICD-10-CM | POA: Insufficient documentation

## 2024-01-08 NOTE — Therapy (Signed)
 OUTPATIENT PHYSICAL THERAPY TREATMENT   Patient Name: [name] MRN: [id number] DOB:Jan 05, 1946, 78 y.o., female Today's Date: 01/08/2024  END OF SESSION:  PT End of Session - 01/08/24 1306     Visit Number 9    Number of Visits 16    Date for PT Re-Evaluation 01/17/24    Authorization Type MEDICARE    Progress Note Due on Visit 10    PT Start Time 1302    PT Stop Time 1345    PT Time Calculation (min) 43 min    Activity Tolerance Patient tolerated treatment well    Behavior During Therapy WFL for tasks assessed/performed                  Past Medical History:  Diagnosis Date   ARTHRITIS, HIPS, BILATERAL    Atrial fibrillation (HCC)    chronic anticoag - Pradaxa    BURSITIS, SUBTROCHANTERIC    HYPERTENSION    MITRAL VALVE PROLAPSE    Mixed hyperlipidemia    Past Surgical History:  Procedure Laterality Date   DILATION AND CURETTAGE OF UTERUS     TONSILLECTOMY     Patient Active Problem List   Diagnosis Date Noted   Wrist pain, acute, right 11/30/2023   Thyroid  nodule 11/29/2023   Fall 11/29/2023   Head trauma 11/29/2023   Anxiety 10/11/2023   Memory impairment 10/03/2023   Intermittent right-sided chest pain 07/25/2023   Pruritus - legs 01/22/2023   Vitamin D  deficiency 01/22/2022   Secondary hypercoagulable state (HCC) 11/17/2020   Nasal lesion 11/08/2020   B12 deficiency 10/28/2019   IBS (irritable bowel syndrome) 10/28/2019   Arthritis of right hand 07/22/2019   Osteoarthritis of hand 06/05/2019   Presbycusis of both ears 05/14/2018   ETD (Eustachian tube dysfunction), bilateral 04/12/2018   Prediabetes 05/15/2017   Pain of right heel 01/09/2017   Primary osteoarthritis of both feet 07/10/2016   Primary osteoarthritis of both hands 07/10/2016   Alopecia totalis 06/05/2016   Dermatochalasis of both upper eyelids 10/12/2015   H/O dysplastic nevus 08/03/2015   Degenerative lumbar disc 12/04/2013   Hematuria, undiagnosed cause 09/02/2013    Osteopenia 06/11/2013   Primary generalized (osteo)arthritis 12/18/2011   Mixed hyperlipidemia 03/25/2009   Essential hypertension 03/24/2009   ATRIAL FIBRILLATION 03/24/2009   Greater trochanteric bursitis of left hip 02/12/2007    PCP: Colene Dauphin, MD  REFERRING PROVIDER: Clyde Darling, DO  REFERRING DIAG: [phone] (ICD-10-CM) - Pain in left hip R26.2 (ICD-10-CM) - Difficulty in walking, not elsewhere classified Bilateral OA of hip, bilateral OA of knee, pain in R hip, pain in L hip  THERAPY DIAG:  Bilateral hip pain  Pain in both knees, unspecified chronicity  Muscle weakness (generalized)  Other symptoms and signs involving the musculoskeletal system  Other abnormalities of gait and mobility  Rationale for Evaluation and Treatment: Rehabilitation  ONSET DATE: chronic   SUBJECTIVE:   SUBJECTIVE STATEMENT: Pt reports some soreness after PT session, but in a good way.   EVAL: Patient states she just joined the gym a few weeks ago. Then had a problem with her hip. Ruptured disc in back twice. Patient states hip pain for a long time. Shots helped a little in R. R hip is worse than the left. On way to [place]  she had a pain in R glute where she had to go to ER. L hip has some pain as well. Symptoms are worse with prolonged walking. Wants to able to workout again, eager to  get stronger. Had weak abductors in the past. Legs are very weak and has trouble with stairs. L knee bothers her and troubles with squats and lunges.  PERTINENT HISTORY: hypertension, bilateral hip arthritis, atrial fibrillation  PAIN:  Are you having pain? No at rest  PRECAUTIONS: None  WEIGHT BEARING RESTRICTIONS: No  FALLS:  Has patient fallen in last 6 months? No  PLOF: Independent  PATIENT GOALS: get stronger, exercise   OBJECTIVE: (objective measures from initial evaluation unless otherwise dated)  PATIENT SURVEYS:  LEFS 31/80  COGNITION: Overall cognitive status: Within  functional limits for tasks assessed     SENSATION: WFL  POSTURE: No Significant postural limitations  PALPATION: Grossly tender bilateral glutes  LOWER EXTREMITY ROM:  Active ROM Right eval Left eval  Hip flexion    Hip extension    Hip abduction    Hip adduction    Hip internal rotation    Hip external rotation    Knee flexion    Knee extension    Ankle dorsiflexion    Ankle plantarflexion    Ankle inversion    Ankle eversion     (Blank rows = not tested) *= pain/symptoms  LOWER EXTREMITY MMT:  MMT Right eval Left eval  Hip flexion 4 4-  Hip extension 3 3  Hip abduction 3- 3  Hip adduction    Hip internal rotation    Hip external rotation    Knee flexion 4+ 4+  Knee extension 4+ 4+  Ankle dorsiflexion    Ankle plantarflexion    Ankle inversion    Ankle eversion     (Blank rows = not tested) *= pain/symptoms    FUNCTIONAL TESTS:  5 times sit to stand: 31.35 seconds with hands on thighs, labored, relies L>RLE, R valgus Stairs: alternating pattern, 7 inch steps, unilateral HHA, L knee pain, decreased concentric and eccentric strength and motor control bilaterally, increased valgus on R  GAIT: Distance walked: 100 feet Assistive device utilized: None Level of assistance: Complete Independence Comments: R truncal lean on R stance, decreased hip extension bilaterally   TODAY'S TREATMENT:                                                                                                                              DATE:   01/08/24 Nustep 5 minutes LE L4 only for dynamic warm up Step up 6 inch 2 x 10  Lateral step up 6 inch 2 x 10  Standing hip abduction/extension RTB at ankles 2 x 10ea Sit to stands 2x10  Marching on airex (slow) NBOS airex with head turns Semi tandem on airex 2 x 30 second holds ea Cone taps for balance-3cones x5ea bil   01/04/24 Nustep 5 minutes LE L4 only for dynamic warm up Step up 6 inch 2 x 10  Lateral step up 6 inch 2 x 10   Standing hip abduction/extension RTB at ankles 2 x 10 Sit to stands 2x10  NBOS on airex 2 x 30 seconds Marching on airex (slow) NBOS airex with head turns Semi tandem on airex 2 x 30 second holds ea   01/02/24 Nustep 5 minutes LE L4 only for dynamic warm up Step up 6 inch 2 x 10  Lateral step up 6 inch 2 x 10  Standing hip abduction RTB at knees 2 x 10 Standing cone taps 3 x 10 NBOS on airex 2 x 30 seconds NBOS airex with head nods Semi tandem on airex 2 x 30 second holds   PATIENT EDUCATION:  Education details: Patient educated on exam findings, POC, scope of PT, HEP, relevant anatomy and pathology. 12/14/23: HEP Person educated: Patient Education method: Explanation, Demonstration, and Handouts Education comprehension: verbalized understanding, returned demonstration, verbal cues required, and tactile cues required  HOME EXERCISE PROGRAM: Access Code: [email or web address] URL: [email or web address] Date: 11/22/2023 Prepared by: Debria Fang Zaunegger  Exercises - Supine Bridge  - 1 x daily - 7 x weekly - 2 sets - 10 reps - Clamshell  - 1 x daily - 7 x weekly - 2 sets - 10 reps  ASSESSMENT:  CLINICAL IMPRESSION: Pt continues to require some verbal cuing with standing hip abduction for proper hip orientation. Mild unsteadiness with balance today on compliant surface. She denied pain with any activity. Advised she try her stretches first thing in the morning as she states this is when she has the most discomfort. Will continue to progress strengthening and stability as tolerated.     OBJECTIVE IMPAIRMENTS: Abnormal gait, decreased activity tolerance, decreased balance, decreased endurance, decreased mobility, difficulty walking, decreased ROM, decreased strength, increased muscle spasms, impaired flexibility, improper body mechanics, and pain.   ACTIVITY LIMITATIONS: carrying, lifting, bending, standing, squatting, stairs, transfers, locomotion level, and caring for  others  PARTICIPATION LIMITATIONS: meal prep, cleaning, laundry, shopping, community activity, and yard work  PERSONAL FACTORS: Time since onset of injury/illness/exacerbation and 3+ comorbidities: HTN, afib, hx hip and knee pain, hx back pain are also affecting patient's functional outcome.   REHAB POTENTIAL: Good  CLINICAL DECISION MAKING: Evolving/moderate complexity  EVALUATION COMPLEXITY: Moderate   GOALS: Goals reviewed with patient? Yes  SHORT TERM GOALS: Target date: 12/20/2023    Patient will be independent with HEP in order to improve functional outcomes. Baseline: Goal status: MET 6/3  2.  Patient will report at least 25% improvement in symptoms for improved quality of life. Baseline: Goal status: MET 6/3    LONG TERM GOALS: Target date: 01/17/2024    Patient will report at least 75% improvement in symptoms for improved quality of life. Baseline:  Goal status: IN PROGRESS  2.  Patient will improve LEFS score by at least 9 points in order to indicate improved tolerance to activity. Baseline: 31/80 Goal status: INITIAL  3.  Patient will be able to navigate stairs with reciprocal pattern without compensation in order to demonstrate improved LE strength. Baseline:  Goal status: INITIAL  4.  Patient will demonstrate grade of 5/5 MMT grade in all tested musculature as evidence of improved strength to assist with stair ambulation and gait.  Baseline:  Goal status: INITIAL  5.  Patient will be able to complete 5x STS in under 15 seconds in order to demo improved functional strength. Baseline:  Goal status: INITIAL  6.  Patient will be able to perform full gym routine independently.  Baseline:  Goal status: INITIAL   PLAN:  PT FREQUENCY: 2x/week  PT DURATION: 8 weeks  PLANNED INTERVENTIONS: 97164- PT Re-evaluation, 97110-Therapeutic exercises,  16109- Therapeutic activity, [id number]- Neuromuscular re-education, [phone]- Self Care, 09811- Manual therapy, [phone]-  Gait training, [phone]- Orthotic Fit/training, [phone]- Canalith repositioning, [id number]- Aquatic Therapy, [phone]- Splinting, Patient/Family education, Balance training, Stair training, Taping, Dry Needling, Joint mobilization, Joint manipulation, Spinal manipulation, Spinal mobilization, Scar mobilization, and DME instructions.  PLAN FOR NEXT SESSION: f/u with HEP, glute and quad strength, functional strength, progress to gym routine as able    Judie Noun, PTA 01/08/2024, 2:28 PM

## 2024-01-10 ENCOUNTER — Encounter (HOSPITAL_BASED_OUTPATIENT_CLINIC_OR_DEPARTMENT_OTHER): Payer: Self-pay

## 2024-01-10 ENCOUNTER — Ambulatory Visit (HOSPITAL_BASED_OUTPATIENT_CLINIC_OR_DEPARTMENT_OTHER)

## 2024-01-10 DIAGNOSIS — M25562 Pain in left knee: Secondary | ICD-10-CM | POA: Diagnosis not present

## 2024-01-10 DIAGNOSIS — M25552 Pain in left hip: Secondary | ICD-10-CM | POA: Diagnosis not present

## 2024-01-10 DIAGNOSIS — M25551 Pain in right hip: Secondary | ICD-10-CM | POA: Diagnosis not present

## 2024-01-10 DIAGNOSIS — R2689 Other abnormalities of gait and mobility: Secondary | ICD-10-CM

## 2024-01-10 DIAGNOSIS — M6281 Muscle weakness (generalized): Secondary | ICD-10-CM | POA: Diagnosis not present

## 2024-01-10 DIAGNOSIS — R29898 Other symptoms and signs involving the musculoskeletal system: Secondary | ICD-10-CM | POA: Diagnosis not present

## 2024-01-10 DIAGNOSIS — M25561 Pain in right knee: Secondary | ICD-10-CM | POA: Diagnosis not present

## 2024-01-10 NOTE — Therapy (Addendum)
 OUTPATIENT PHYSICAL THERAPY TREATMENT Progress Note Reporting Period 11/22/2023 to 01/17/2024  See note below for Objective Data and Assessment of Progress/Goals.       Patient Name: NATALINE BASARA MRN: [id number] DOB:1945-08-25, 78 y.o., female Today's Date: 01/10/2024  END OF SESSION:  PT End of Session - 01/10/24 1354     Visit Number 10    Number of Visits 16    Date for PT Re-Evaluation 01/17/24    Authorization Type MEDICARE    Progress Note Due on Visit 10    PT Start Time 1352    PT Stop Time 1430    PT Time Calculation (min) 38 min    Activity Tolerance Patient tolerated treatment well    Behavior During Therapy WFL for tasks assessed/performed                   Past Medical History:  Diagnosis Date   ARTHRITIS, HIPS, BILATERAL    Atrial fibrillation (HCC)    chronic anticoag - Pradaxa    BURSITIS, SUBTROCHANTERIC    HYPERTENSION    MITRAL VALVE PROLAPSE    Mixed hyperlipidemia    Past Surgical History:  Procedure Laterality Date   DILATION AND CURETTAGE OF UTERUS     TONSILLECTOMY     Patient Active Problem List   Diagnosis Date Noted   Wrist pain, acute, right 11/30/2023   Thyroid  nodule 11/29/2023   Fall 11/29/2023   Head trauma 11/29/2023   Anxiety 10/11/2023   Memory impairment 10/03/2023   Intermittent right-sided chest pain 07/25/2023   Pruritus - legs 01/22/2023   Vitamin D  deficiency 01/22/2022   Secondary hypercoagulable state (HCC) 11/17/2020   Nasal lesion 11/08/2020   B12 deficiency 10/28/2019   IBS (irritable bowel syndrome) 10/28/2019   Arthritis of right hand 07/22/2019   Osteoarthritis of hand 06/05/2019   Presbycusis of both ears 05/14/2018   ETD (Eustachian tube dysfunction), bilateral 04/12/2018   Prediabetes 05/15/2017   Pain of right heel 01/09/2017   Primary osteoarthritis of both feet 07/10/2016   Primary osteoarthritis of both hands 07/10/2016   Alopecia totalis 06/05/2016   Dermatochalasis of both upper  eyelids 10/12/2015   H/O dysplastic nevus 08/03/2015   Degenerative lumbar disc 12/04/2013   Hematuria, undiagnosed cause 09/02/2013   Osteopenia 06/11/2013   Primary generalized (osteo)arthritis 12/18/2011   Mixed hyperlipidemia 03/25/2009   Essential hypertension 03/24/2009   ATRIAL FIBRILLATION 03/24/2009   Greater trochanteric bursitis of left hip 02/12/2007    PCP: Colene Dauphin, MD  REFERRING PROVIDER: Clyde Darling, DO  REFERRING DIAG: [phone] (ICD-10-CM) - Pain in left hip R26.2 (ICD-10-CM) - Difficulty in walking, not elsewhere classified Bilateral OA of hip, bilateral OA of knee, pain in R hip, pain in L hip  THERAPY DIAG:  Bilateral hip pain  Pain in both knees, unspecified chronicity  Muscle weakness (generalized)  Other symptoms and signs involving the musculoskeletal system  Other abnormalities of gait and mobility  Rationale for Evaluation and Treatment: Rehabilitation  ONSET DATE: chronic   SUBJECTIVE:   SUBJECTIVE STATEMENT: Pt reports she can feel her hip hurting throughout the day.   EVAL: Patient states she just joined the gym a few weeks ago. Then had a problem with her hip. Ruptured disc in back twice. Patient states hip pain for a long time. Shots helped a little in R. R hip is worse than the left. On way to [place]  she had a pain in R glute where she had to go to  ER. L hip has some pain as well. Symptoms are worse with prolonged walking. Wants to able to workout again, eager to get stronger. Had weak abductors in the past. Legs are very weak and has trouble with stairs. L knee bothers her and troubles with squats and lunges.  PERTINENT HISTORY: hypertension, bilateral hip arthritis, atrial fibrillation  PAIN:  Are you having pain? No at rest  PRECAUTIONS: None  WEIGHT BEARING RESTRICTIONS: No  FALLS:  Has patient fallen in last 6 months? No  PLOF: Independent  PATIENT GOALS: get stronger, exercise   OBJECTIVE: (objective measures  from initial evaluation unless otherwise dated)  PATIENT SURVEYS:  LEFS 31/80            6/5: 43/80 COGNITION: Overall cognitive status: Within functional limits for tasks assessed     SENSATION: WFL  POSTURE: No Significant postural limitations  PALPATION: Grossly tender bilateral glutes  LOWER EXTREMITY ROM:  Active ROM Right eval Left eval  Hip flexion    Hip extension    Hip abduction    Hip adduction    Hip internal rotation    Hip external rotation    Knee flexion    Knee extension    Ankle dorsiflexion    Ankle plantarflexion    Ankle inversion    Ankle eversion     (Blank rows = not tested) *= pain/symptoms  LOWER EXTREMITY MMT:  MMT Right eval Left eval Right 6/5 Left 6/5  Hip flexion 4 4- 4 4-  Hip extension 3 3 3+ 4-  Hip abduction 3- 3 3+ 4+  Hip adduction      Hip internal rotation      Hip external rotation      Knee flexion 4+ 4+ 5 5  Knee extension 4+ 4+ 4+ 5  Ankle dorsiflexion      Ankle plantarflexion      Ankle inversion      Ankle eversion       (Blank rows = not tested) *= pain/symptoms    FUNCTIONAL TESTS:  5 times sit to stand: 31.35 seconds with hands on thighs, labored, relies L>RLE, R valgus Stairs: alternating pattern, 7 inch steps, unilateral HHA, L knee pain, decreased concentric and eccentric strength and motor control bilaterally, increased valgus on R  6/5: 5xSTS: 11.4seconds Stairs: single HHA, improved control though mild offloading noted of R LE   GAIT: Distance walked: 100 feet Assistive device utilized: None Level of assistance: Complete Independence Comments: R truncal lean on R stance, decreased hip extension bilaterally   TODAY'S TREATMENT:                                                                                                                              DATE:   01/10/24 Nustep 4 minutes LE L4 only for dynamic warm up (had increased pain with this today) Updated MMT LEFS 5xSTS Reviewed  goals   Marching on airex (slow) NBOS on ariex  with EC Semi tandem on airex with corss body reaches   01/08/24 Nustep 5 minutes LE L4 only for dynamic warm up Step up 6 inch 2 x 10  Lateral step up 6 inch 2 x 10  Standing hip abduction/extension RTB at ankles 2 x 10ea Sit to stands 2x10  Marching on airex (slow) NBOS airex with head turns Semi tandem on airex 2 x 30 second holds ea Cone taps for balance-3cones x5ea bil    PATIENT EDUCATION:  Education details: Patient educated on exam findings, POC, scope of PT, HEP, relevant anatomy and pathology. 12/14/23: HEP Person educated: Patient Education method: Explanation, Demonstration, and Handouts Education comprehension: verbalized understanding, returned demonstration, verbal cues required, and tactile cues required  HOME EXERCISE PROGRAM: Access Code: [email or web address] URL: [email or web address] Date: 11/22/2023 Prepared by: Debria Fang Zaunegger  Exercises - Supine Bridge  - 1 x daily - 7 x weekly - 2 sets - 10 reps - Clamshell  - 1 x daily - 7 x weekly - 2 sets - 10 reps  ASSESSMENT:  CLINICAL IMPRESSION: Pt has attended 10 visits of PT thus far and is making steady progress. She has met 2/2 STG and 2/6 LTG. LEFS score has improved from to 31 to 43/80. Pt reports 50% improvement since starting PT. Pt has improved significantly on 5xSTS score compared to IE. She demonstrates improved strength through R LE with stair climbing and sit to stands. With strength testing, she remains significantly weak with bil hip abduction and extension, though very slight improvement from last session. She remains limited by general arthritis with ADLs but has improved performance with R LE with stair climbing. Still having trouble with squatting and cannot get up/down off the floor which is mostly due to L knee pain. Pt remains challenged with balance training but has been able to progress in clinic. She will benefit from continued PT to address  remaining functional deficits.   Patient has met 2/2 short term goals and 2/6 long term goals with ability to complete HEP and improvement in symptoms, activity tolerance, and functional mobility. Remaining goals not met due to continued deficits in strength,activity tolerance,  and functional mobility. Patient has made good progress toward remaining goals. Extending POC 1-2x/week for 8 weeks to work toward remaining goals. Patient will continue to benefit from skilled physical therapy in order to improve function and reduce impairment.  11:36 AM, 01/15/24 Beather Liming PT, DPT Physical Therapist at [facility]    OBJECTIVE IMPAIRMENTS: Abnormal gait, decreased activity tolerance, decreased balance, decreased endurance, decreased mobility, difficulty walking, decreased ROM, decreased strength, increased muscle spasms, impaired flexibility, improper body mechanics, and pain.   ACTIVITY LIMITATIONS: carrying, lifting, bending, standing, squatting, stairs, transfers, locomotion level, and caring for others  PARTICIPATION LIMITATIONS: meal prep, cleaning, laundry, shopping, community activity, and yard work  PERSONAL FACTORS: Time since onset of injury/illness/exacerbation and 3+ comorbidities: HTN, afib, hx hip and knee pain, hx back pain are also affecting patient's functional outcome.   REHAB POTENTIAL: Good  CLINICAL DECISION MAKING: Evolving/moderate complexity  EVALUATION COMPLEXITY: Moderate   GOALS: Goals reviewed with patient? Yes  SHORT TERM GOALS: Target date: 12/20/2023    Patient will be independent with HEP in order to improve functional outcomes. Baseline: Goal status: MET 6/3  2.  Patient will report at least 25% improvement in symptoms for improved quality of life. Baseline: Goal status: MET 6/3    LONG TERM GOALS: Target date: 01/17/2024    Patient will report at least 75%  improvement in symptoms for improved quality of life. Baseline:  Goal status: IN  PROGRESS (50% 6/5)  2.  Patient will improve LEFS score by at least 9 points in order to indicate improved tolerance to activity. Baseline: 31/80 Goal status: MET 6/5 (43/80)  3.  Patient will be able to navigate stairs with reciprocal pattern without compensation in order to demonstrate improved LE strength. Baseline:  Goal status: IN PROGRESS (only mild compensation 6/5)  4.  Patient will demonstrate grade of 5/5 MMT grade in all tested musculature as evidence of improved strength to assist with stair ambulation and gait.  Baseline:  Goal status: IN PROGRESS 6/5  5.  Patient will be able to complete 5x STS in under 15 seconds in order to demo improved functional strength. Baseline:  Goal status: MET 6/5  6.  Patient will be able to perform full gym routine independently.  Baseline:  Goal status: INITIAL (has not yet returned to gym. 6/5)   PLAN:  PT FREQUENCY: 2x/week  PT DURATION: 8 weeks  PLANNED INTERVENTIONS: 97164- PT Re-evaluation, 97110-Therapeutic exercises, 97530- Therapeutic activity, 97112- Neuromuscular re-education, 97535- Self Care, 45409- Manual therapy, [phone]- Gait training, [phone]- Orthotic Fit/training, [phone]- Canalith repositioning, [id number]- Aquatic Therapy, [phone]- Splinting, Patient/Family education, Balance training, Stair training, Taping, Dry Needling, Joint mobilization, Joint manipulation, Spinal manipulation, Spinal mobilization, Scar mobilization, and DME instructions.  PLAN FOR NEXT SESSION: f/u with HEP, glute and quad strength, functional strength, progress to gym routine as able    Judie Noun, PTA 01/10/2024, 4:04 PM

## 2024-01-14 ENCOUNTER — Ambulatory Visit (INDEPENDENT_AMBULATORY_CARE_PROVIDER_SITE_OTHER): Admitting: Psychology

## 2024-01-14 DIAGNOSIS — R4189 Other symptoms and signs involving cognitive functions and awareness: Secondary | ICD-10-CM

## 2024-01-14 NOTE — Progress Notes (Signed)
   NEUROPSYCHOLOGY FEEDBACK SESSION [facility]  [place] Department of Neurology  Date of Feedback Session: 01/14/2024  REASON FOR REFERRAL   [name] is a 78 year old, right-handed, White female with 12 years of formal education. She was referred for neuropsychological evaluation by Tex Filbert, PA-C, to assess current neurocognitive functioning, document potential cognitive deficits, and assist with treatment planning. This is her first neuropsychological evaluation.  FEEDBACK   Patient completed a comprehensive neuropsychological evaluation on 01/07/2024. Please refer to that encounter for the full report and recommendations. Briefly, results  were generally within expectations, except for low verbal abstract reasoning and semantic fluency. Functional independence is maintained. At this time, there is not enough impairment to warrant a diagnosis of mild cognitive impairment. The cause of these isolated weaknesses is unclear and may simply represent normal individual variability or underlying baseline weaknesses. Should she show increased language difficulties or further cognitive decline, a neuropsychological reevaluation would be advised. Otherwise, subjective cognitive concerns are likely multifactorial and related to normal aging, persistent fatigue, chronic pain, mild cerebrovascular changes, hearing loss, and possible medication side effects (e.g., tizanidine ). To the extent that modifiable factors can be ameliorated, she may find that her subjective cognitive concerns improve.   Today, the patient was unaccompanied. She was provided verbal feedback regarding the findings and impression during this visit, and her questions were answered.  DISPOSITION   Patient will follow up with the referring provider, Ms. Wertman. No follow-up neuropsychological testing was scheduled at this time. Please feel free to refer the patient for repeated evaluation if she shows a  significant change in neurocognitive status.  SERVICE   This feedback session was conducted by Janice Meeter, Psy.D. [place] [id number] (35 minutes) was billed for Dr. Donavon Fudge' time spent in preparing, conducting, and documenting the current feedback session.  This report was generated using voice recognition software. While this document has been carefully reviewed, transcription errors may be present. I apologize in advance for any inconvenience. Please contact me if further clarification is needed.

## 2024-01-15 ENCOUNTER — Other Ambulatory Visit: Payer: Self-pay | Admitting: Sports Medicine

## 2024-01-15 ENCOUNTER — Ambulatory Visit (HOSPITAL_BASED_OUTPATIENT_CLINIC_OR_DEPARTMENT_OTHER): Admitting: Physical Therapy

## 2024-01-15 ENCOUNTER — Encounter (HOSPITAL_BASED_OUTPATIENT_CLINIC_OR_DEPARTMENT_OTHER): Payer: Self-pay | Admitting: Physical Therapy

## 2024-01-15 DIAGNOSIS — M25551 Pain in right hip: Secondary | ICD-10-CM

## 2024-01-15 DIAGNOSIS — R29898 Other symptoms and signs involving the musculoskeletal system: Secondary | ICD-10-CM | POA: Diagnosis not present

## 2024-01-15 DIAGNOSIS — M25541 Pain in joints of right hand: Secondary | ICD-10-CM | POA: Diagnosis not present

## 2024-01-15 DIAGNOSIS — M6281 Muscle weakness (generalized): Secondary | ICD-10-CM

## 2024-01-15 DIAGNOSIS — M25552 Pain in left hip: Secondary | ICD-10-CM

## 2024-01-15 DIAGNOSIS — M9907 Segmental and somatic dysfunction of upper extremity: Secondary | ICD-10-CM | POA: Diagnosis not present

## 2024-01-15 DIAGNOSIS — M25562 Pain in left knee: Secondary | ICD-10-CM | POA: Diagnosis not present

## 2024-01-15 DIAGNOSIS — M25561 Pain in right knee: Secondary | ICD-10-CM | POA: Diagnosis not present

## 2024-01-15 DIAGNOSIS — M9908 Segmental and somatic dysfunction of rib cage: Secondary | ICD-10-CM | POA: Diagnosis not present

## 2024-01-15 DIAGNOSIS — M9906 Segmental and somatic dysfunction of lower extremity: Secondary | ICD-10-CM | POA: Diagnosis not present

## 2024-01-15 DIAGNOSIS — M222X2 Patellofemoral disorders, left knee: Secondary | ICD-10-CM | POA: Diagnosis not present

## 2024-01-15 DIAGNOSIS — R2689 Other abnormalities of gait and mobility: Secondary | ICD-10-CM

## 2024-01-15 DIAGNOSIS — G8929 Other chronic pain: Secondary | ICD-10-CM

## 2024-01-15 DIAGNOSIS — M25531 Pain in right wrist: Secondary | ICD-10-CM | POA: Diagnosis not present

## 2024-01-15 NOTE — Progress Notes (Signed)
 Assessment/Plan:    Cognitive changes    [name] is a very pleasant 78 y.o. RH female with a history of*** presenting today in follow-up for evaluation of memory loss. Patient is on memantine  5 mg twice daily***.  Etiology of memory loss is likely multifactorial.  She continues to perform all her ADLs without difficulty, continues to drive     Recommendations:   Follow up in   months. Recommend good control of cardiovascular risk factors, she is on Xarelto , follow-up with cardiology Continue to control mood as per PCP Recommend checking hearing to improve comprehension Recommend psychotherapy for anxiety and depression    Subjective:   This patient is accompanied in the office by ***  who supplements the history. Previous records as well as any outside records available were reviewed prior to todays visit.   Patient was last seen on 10/03/2023 with a MoCA 23/30***.    Any changes in memory since last visit? ". repeats oneself?  Endorsed Disoriented when walking into a room?  Patient denies ***  Misplacing objects?  Patient denies   Wandering behavior?   Denies. Any personality changes since last visit? Denies.   Any worsening depression?: denies.   Hallucinations or paranoia?  Denies.   Seizures?   Denies.    Any sleep changes? Sleeps well***. Does not sleep very well***.   Denies vivid dreams, REM behavior or sleepwalking   Sleep apnea?   denies ***  Any hygiene concerns?   Denies.   Independent of bathing and dressing?  Endorsed  Does the patient needs help with medications? Patient is in charge *** Who is in charge of the finances?  Patient is in charge   *** Any changes in appetite?  denies ***   Patient have trouble swallowing?  Denies.   Does the patient cook?  Any kitchen accidents such as leaving the stove on?   Denies.   Any headaches?    Denies.   Vision changes? Denies. Chronic pain?  Denies.   Ambulates with difficulty?    Denies. ***  Recent  falls or head injuries?    Denies.      Unilateral weakness, numbness or tingling?  Denies.   Any tremors?  Denies.   Any anosmia?    Denies.   Any incontinence of urine?  Denies.   Any bowel dysfunction?  Denies.      Patient lives .*** Does the patient drive?***  Neuropsych evaluation 6-25   Briefly, results  were generally within expectations, except for low verbal abstract reasoning and semantic fluency. Functional independence is maintained. At this time, there is not enough impairment to warrant a diagnosis of mild cognitive impairment. The cause of these isolated weaknesses is unclear and may simply represent normal individual variability or underlying baseline weaknesses. Should she show increased language difficulties or further cognitive decline, a neuropsychological reevaluation would be advised. Otherwise, subjective cognitive concerns are likely multifactorial and related to normal aging, persistent fatigue, chronic pain, mild cerebrovascular changes, hearing loss, and possible medication side effects (e.g., tizanidine ). To the extent that modifiable factors can be ameliorated, she may find that her subjective cognitive concerns improve.    How long did patient have memory difficulties?  For several years, worse over the last year.  Patient reports some difficulty remembering new information, recent conversations, names. "I know the word but cannot retrieve it". I try to avoid conversations because I cannot remember details, so I diverted to my husband to let him continue  the conversation for me". "I was at my Gyn, 3 times I could not think of the word colonoscopy".  Sometimes she reports "talking faster than I can think at which time she will cough just to mask the situation ".  STM And LTM are affected. She likes doing Associate Professor. She needs to write a list on the bathroom mirror so that she remembers what to do during the day. Repeats oneself?  Endorsed Disoriented when walking into a  room?  Patient denies   Leaving objects in unusual places?  Denies.   Wandering behavior? Denies.   Any personality changes, or depression, anxiety? She has noted more anxiety, but "depression runs in the family, may feel down at times, but not depressed" Hallucinations or paranoia? denies   Seizures? denies    Any sleep changes?  Sleeps well, but has to get up 3 times to urinate. Denies vivid dreams, REM behavior or sleepwalking   Sleep apnea? Denies.   Any hygiene concerns?  Denies.   Independent of bathing and dressing? Endorsed , but due to hand arthritis so se needs help tying the shoes. Does the patient need help with medications?  Patient is in charge, she has difficulty with the night dose.    Who is in charge of the finances?  Husband is in charge     Any changes in appetite?   Denies."I am always hungry, sugar craving".      Patient have trouble swallowing?  Denies.   Does the patient cook? Yes, denies forgetting recipes. No kitchen accidents Any headaches?  Denies.   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 Denies  Recent falls or head injuries? Denies.     Vision changes?  Denies any new issues.  Had cataract surgery in the past, needs glasses.  Any strokelike symptoms? Denies.   Any tremors? Denies.   Any anosmia? Denies.   Any incontinence of urine Endorsed  Any bowel dysfunction? She has possibly IBS, with diarrhea    Patient lives with her husband, she travels extensively in [place] History of heavy alcohol intake? Denies.   History of heavy tobacco use? Denies.   Family history of dementia?  Denies   Does patient drive? Yes, sometimes she uses GPS, denies any issue     Past Surgical History:  Procedure Laterality Date   DILATION AND CURETTAGE OF UTERUS     TONSILLECTOMY       PREVIOUS MEDICATIONS:   CURRENT MEDICATIONS:  Outpatient Encounter Medications as of 01/16/2024  Medication Sig   acetaminophen   (TYLENOL ) 650 MG CR tablet Take 650 mg by mouth 2 (two) times daily.    bifidobacterium infantis (ALIGN) capsule Take 1 capsule by mouth daily.   cholecalciferol (VITAMIN D ) 1000 UNITS tablet Take 2,000 Units by mouth daily.   co-enzyme Q-10 50 MG capsule Take 50 mg by mouth daily.   diltiazem  (CARDIZEM  CD) 180 MG 24 hr capsule TAKE 1 CAPSULE BY MOUTH TWICE A DAY   diltiazem  (CARDIZEM ) 30 MG tablet TAKE 1 TABLET EVERY 4 HOURS AS NEEDED FOR AFIB HEART RATE >100   DULoxetine  (CYMBALTA ) 30 MG capsule Take 1 capsule (30 mg total) by mouth daily.   hydrochlorothiazide  (HYDRODIURIL ) 25 MG tablet TAKE 1 TABLET (25 MG TOTAL) BY MOUTH DAILY.   HYDROcodone -acetaminophen  (NORCO/VICODIN) 5-325 MG tablet Take 1 tablet by mouth every 6 (six) hours as needed for severe pain (pain score 7-10).   KLOR-CON  M10 10 MEQ tablet  TAKE 1 TABLET BY MOUTH THREE TIMES A DAY   lisinopril  (ZESTRIL ) 40 MG tablet TAKE 1 TABLET BY MOUTH EVERY DAY   magnesium oxide (MAG-OX) 400 MG tablet Take 400 mg by mouth daily.   memantine  (NAMENDA ) 5 MG tablet Take 1 tablet (5 mg at night) for 2 weeks, then increase to 1 tablet (5 mg) twice a day   pravastatin  (PRAVACHOL ) 40 MG tablet Take 1 tablet (40 mg total) by mouth daily.   rivaroxaban  (XARELTO ) 20 MG TABS tablet Take 1 tablet (20 mg total) by mouth daily with supper.   tiZANidine  (ZANAFLEX ) 4 MG tablet Take 1 tablet (4 mg total) by mouth every 6 (six) hours as needed for muscle spasms.   No facility-administered encounter medications on file as of 01/16/2024.     Objective:     PHYSICAL EXAMINATION:    VITALS:  There were no vitals filed for this visit.  GEN:  The patient appears stated age and is in NAD. HEENT:  Normocephalic, atraumatic.   Neurological examination:  General: NAD, well-groomed, appears stated age. Orientation: The patient is alert. Oriented to person, place and not to date.*** Cranial nerves: There is good facial symmetry.The speech is fluent and clear,  hesitant at times. No aphasia or dysarthria. Fund of knowledge is appropriate. Recent memory impaired and remote memory is normal.  Attention and concentration are normal.  Able to name objects and repeat phrases.  Hearing is intact to conversational tone ***.   Delayed recall *** Sensation: Sensation is intact to light touch throughout Motor: Strength is at least antigravity x4. DTR's 2/4 in UE/LE      10/03/2023   10:00 AM  Montreal Cognitive Assessment   Visuospatial/ Executive (0/5) 3  Naming (0/3) 3  Attention: Read list of digits (0/2) 2  Attention: Read list of letters (0/1) 1  Attention: Serial 7 subtraction starting at 100 (0/3) 3  Language: Repeat phrase (0/2) 1  Language : Fluency (0/1) 0  Abstraction (0/2) 1  Delayed Recall (0/5) 3  Orientation (0/6) 6  Total 23  Adjusted Score (based on education) 23       04/18/2018    8:45 AM 03/28/2016    9:53 AM  MMSE - Mini Mental State Exam  Not completed:  --  Orientation to time 5   Orientation to Place 5   Registration 3   Attention/ Calculation 5   Recall 3   Language- name 2 objects 2   Language- repeat 1   Language- follow 3 step command 3   Language- read & follow direction 1   Write a sentence 1   Copy design 1   Total score 30        Movement examination: Tone: There is normal tone in the UE/LE Abnormal movements:  no tremor.  No myoclonus.  No asterixis.   Coordination:  There is no decremation with RAM's. Normal finger to nose  Gait and Station: The patient has no difficulty arising out of a deep-seated chair without the use of the hands. The patient's stride length is good.  Gait is cautious and narrow.   Thank you for allowing us  the opportunity to participate in the care of this nice patient. Please do not hesitate to contact us  for any questions or concerns.   Total time spent on today's visit was *** minutes dedicated to this patient today, preparing to see patient, examining the patient, ordering  tests and/or medications and counseling the patient, documenting clinical information  in the EHR or other health record, independently interpreting results and communicating results to the patient/family, discussing treatment and goals, answering patient's questions and coordinating care.  Cc:  Colene Dauphin, MD  [place] Filbert 01/15/2024 12:00 PM

## 2024-01-15 NOTE — Therapy (Signed)
 OUTPATIENT PHYSICAL THERAPY TREATMENT       Patient Name: [name] MRN: [id number] DOB:[birth year], 78 y.o., female Today's Date: 01/15/2024  END OF SESSION:  PT End of Session - 01/15/24 1304     Visit Number 11    Number of Visits 32    Date for PT Re-Evaluation 03/11/24    Authorization Type MEDICARE    Progress Note Due on Visit 20    PT Start Time 1302    PT Stop Time 1345    PT Time Calculation (min) 43 min    Activity Tolerance Patient tolerated treatment well    Behavior During Therapy WFL for tasks assessed/performed                   Past Medical History:  Diagnosis Date   ARTHRITIS, HIPS, BILATERAL    Atrial fibrillation (HCC)    chronic anticoag - Pradaxa    BURSITIS, SUBTROCHANTERIC    HYPERTENSION    MITRAL VALVE PROLAPSE    Mixed hyperlipidemia    Past Surgical History:  Procedure Laterality Date   DILATION AND CURETTAGE OF UTERUS     TONSILLECTOMY     Patient Active Problem List   Diagnosis Date Noted   Wrist pain, acute, right 11/30/2023   Thyroid  nodule 11/29/2023   Fall 11/29/2023   Head trauma 11/29/2023   Anxiety 10/11/2023   Memory impairment 10/03/2023   Intermittent right-sided chest pain 07/25/2023   Pruritus - legs 01/22/2023   Vitamin D  deficiency 01/22/2022   Secondary hypercoagulable state (HCC) 11/17/2020   Nasal lesion 11/08/2020   B12 deficiency 10/28/2019   IBS (irritable bowel syndrome) 10/28/2019   Arthritis of right hand 07/22/2019   Osteoarthritis of hand 06/05/2019   Presbycusis of both ears 05/14/2018   ETD (Eustachian tube dysfunction), bilateral 04/12/2018   Prediabetes 05/15/2017   Pain of right heel 01/09/2017   Primary osteoarthritis of both feet 07/10/2016   Primary osteoarthritis of both hands 07/10/2016   Alopecia totalis 06/05/2016   Dermatochalasis of both upper eyelids 10/12/2015   H/O dysplastic nevus 08/03/2015   Degenerative lumbar disc 12/04/2013   Hematuria, undiagnosed cause  09/02/2013   Osteopenia 06/11/2013   Primary generalized (osteo)arthritis 12/18/2011   Mixed hyperlipidemia 03/25/2009   Essential hypertension 03/24/2009   ATRIAL FIBRILLATION 03/24/2009   Greater trochanteric bursitis of left hip 02/12/2007    PCP: Colene Dauphin, MD  REFERRING PROVIDER: Clyde Darling, DO  REFERRING DIAG: [phone] (ICD-10-CM) - Pain in left hip R26.2 (ICD-10-CM) - Difficulty in walking, not elsewhere classified Bilateral OA of hip, bilateral OA of knee, pain in R hip, pain in L hip  THERAPY DIAG:  Bilateral hip pain  Pain in both knees, unspecified chronicity  Muscle weakness (generalized)  Other symptoms and signs involving the musculoskeletal system  Other abnormalities of gait and mobility  Pain in right hip  Rationale for Evaluation and Treatment: Rehabilitation  ONSET DATE: chronic   SUBJECTIVE:   SUBJECTIVE STATEMENT: Pt reports hips still sore. Wobbly sometimes.   EVAL: Patient states she just joined the gym a few weeks ago. Then had a problem with her hip. Ruptured disc in back twice. Patient states hip pain for a long time. Shots helped a little in R. R hip is worse than the left. On way to [place]  she had a pain in R glute where she had to go to ER. L hip has some pain as well. Symptoms are worse with prolonged walking. Wants to able  to workout again, eager to get stronger. Had weak abductors in the past. Legs are very weak and has trouble with stairs. L knee bothers her and troubles with squats and lunges.  PERTINENT HISTORY: hypertension, bilateral hip arthritis, atrial fibrillation  PAIN:  Are you having pain? No at rest  PRECAUTIONS: None  WEIGHT BEARING RESTRICTIONS: No  FALLS:  Has patient fallen in last 6 months? No  PLOF: Independent  PATIENT GOALS: get stronger, exercise   OBJECTIVE: (objective measures from initial evaluation unless otherwise dated)  PATIENT SURVEYS:  LEFS 31/80            6/5:  43/80 COGNITION: Overall cognitive status: Within functional limits for tasks assessed     SENSATION: WFL  POSTURE: No Significant postural limitations  PALPATION: Grossly tender bilateral glutes  LOWER EXTREMITY ROM:  Active ROM Right eval Left eval  Hip flexion    Hip extension    Hip abduction    Hip adduction    Hip internal rotation    Hip external rotation    Knee flexion    Knee extension    Ankle dorsiflexion    Ankle plantarflexion    Ankle inversion    Ankle eversion     (Blank rows = not tested) *= pain/symptoms  LOWER EXTREMITY MMT:  MMT Right eval Left eval Right 6/5 Left 6/5  Hip flexion 4 4- 4 4-  Hip extension 3 3 3+ 4-  Hip abduction 3- 3 3+ 4+  Hip adduction      Hip internal rotation      Hip external rotation      Knee flexion 4+ 4+ 5 5  Knee extension 4+ 4+ 4+ 5  Ankle dorsiflexion      Ankle plantarflexion      Ankle inversion      Ankle eversion       (Blank rows = not tested) *= pain/symptoms    FUNCTIONAL TESTS:  5 times sit to stand: 31.35 seconds with hands on thighs, labored, relies L>RLE, R valgus Stairs: alternating pattern, 7 inch steps, unilateral HHA, L knee pain, decreased concentric and eccentric strength and motor control bilaterally, increased valgus on R  6/5: 5xSTS: 11.4seconds Stairs: single HHA, improved control though mild offloading noted of R LE   GAIT: Distance walked: 100 feet Assistive device utilized: None Level of assistance: Complete Independence Comments: R truncal lean on R stance, decreased hip extension bilaterally   TODAY'S TREATMENT:                                                                                                                              DATE:  01/15/24 Nustep 5 minutes LE L4 only for dynamic warm up  March on airex 2 x 10 Step up over airex 2 x 10 bilateral SLS with 3 way reach on airex 5 x 5 second holds Lateral step up 6 inch 2 x 10  Lateral lunge 1 x  10 RLE , L  attempted but poor mechanics and crepitus and pain   01/10/24 Nustep 4 minutes LE L4 only for dynamic warm up (had increased pain with this today) Updated MMT LEFS 5xSTS Reviewed goals   Marching on airex (slow) NBOS on ariex with EC Semi tandem on airex with corss body reaches   01/08/24 Nustep 5 minutes LE L4 only for dynamic warm up Step up 6 inch 2 x 10  Lateral step up 6 inch 2 x 10  Standing hip abduction/extension RTB at ankles 2 x 10ea Sit to stands 2x10  Marching on airex (slow) NBOS airex with head turns Semi tandem on airex 2 x 30 second holds ea [facility] taps for balance-3cones x5ea bil    PATIENT EDUCATION:  Education details: Patient educated on exam findings, POC, scope of PT, HEP, relevant anatomy and pathology. 12/14/23: HEP Person educated: Patient Education method: Explanation, Demonstration, and Handouts Education comprehension: verbalized understanding, returned demonstration, verbal cues required, and tactile cues required  HOME EXERCISE PROGRAM: Access Code: [email or web address] URL: [email or web address] Date: 11/22/2023 Prepared by: Debria Fang Lynette Topete  Exercises - Supine Bridge  - 1 x daily - 7 x weekly - 2 sets - 10 reps - Clamshell  - 1 x daily - 7 x weekly - 2 sets - 10 reps  ASSESSMENT:  CLINICAL IMPRESSION: Continued with LE strengthening and balance training. Intermittent unsteadiness during session. Patient will continue to benefit from physical therapy in order to improve function and reduce impairment.     OBJECTIVE IMPAIRMENTS: Abnormal gait, decreased activity tolerance, decreased balance, decreased endurance, decreased mobility, difficulty walking, decreased ROM, decreased strength, increased muscle spasms, impaired flexibility, improper body mechanics, and pain.   ACTIVITY LIMITATIONS: carrying, lifting, bending, standing, squatting, stairs, transfers, locomotion level, and caring for others  PARTICIPATION LIMITATIONS: meal prep,  cleaning, laundry, shopping, community activity, and yard work  PERSONAL FACTORS: Time since onset of injury/illness/exacerbation and 3+ comorbidities: HTN, afib, hx hip and knee pain, hx back pain are also affecting patient's functional outcome.   REHAB POTENTIAL: Good  CLINICAL DECISION MAKING: Evolving/moderate complexity  EVALUATION COMPLEXITY: Moderate   GOALS: Goals reviewed with patient? Yes  SHORT TERM GOALS: Target date: 12/20/2023    Patient will be independent with HEP in order to improve functional outcomes. Baseline: Goal status: MET 6/3  2.  Patient will report at least 25% improvement in symptoms for improved quality of life. Baseline: Goal status: MET 6/3    LONG TERM GOALS: Target date: 01/17/2024    Patient will report at least 75% improvement in symptoms for improved quality of life. Baseline:  Goal status: IN PROGRESS (50% 6/5)  2.  Patient will improve LEFS score by at least 9 points in order to indicate improved tolerance to activity. Baseline: 31/80 Goal status: MET 6/5 (43/80)  3.  Patient will be able to navigate stairs with reciprocal pattern without compensation in order to demonstrate improved LE strength. Baseline:  Goal status: IN PROGRESS (only mild compensation 6/5)  4.  Patient will demonstrate grade of 5/5 MMT grade in all tested musculature as evidence of improved strength to assist with stair ambulation and gait.  Baseline:  Goal status: IN PROGRESS 6/5  5.  Patient will be able to complete 5x STS in under 15 seconds in order to demo improved functional strength. Baseline:  Goal status: MET 6/5  6.  Patient will be able to perform full gym routine independently.  Baseline:  Goal status: INITIAL (has not yet returned to  gym. 6/5)   PLAN:  PT FREQUENCY: 2x/week  PT DURATION: 8 weeks  PLANNED INTERVENTIONS: [id number]- PT Re-evaluation, 97110-Therapeutic exercises, 97530- Therapeutic activity, 97112- Neuromuscular re-education,  97535- Self Care, 16109- Manual therapy, [phone]- Gait training, [phone]- Orthotic Fit/training, [phone]- Canalith repositioning, [id number]- Aquatic Therapy, [phone]- Splinting, Patient/Family education, Balance training, Stair training, Taping, Dry Needling, Joint mobilization, Joint manipulation, Spinal manipulation, Spinal mobilization, Scar mobilization, and DME instructions.  PLAN FOR NEXT SESSION: f/u with HEP, glute and quad strength, functional strength, progress to gym routine as able    Beather Liming, PT 01/15/2024, 1:51 PM

## 2024-01-15 NOTE — Addendum Note (Signed)
 Addended by: Mayce Noyes S on: 01/15/2024 11:38 AM   Modules accepted: Orders

## 2024-01-16 ENCOUNTER — Encounter: Payer: Self-pay | Admitting: Physician Assistant

## 2024-01-16 ENCOUNTER — Ambulatory Visit (INDEPENDENT_AMBULATORY_CARE_PROVIDER_SITE_OTHER): Admitting: Physician Assistant

## 2024-01-16 VITALS — BP 123/46 | HR 88 | Ht 64.0 in | Wt 168.0 lb

## 2024-01-16 DIAGNOSIS — R4189 Other symptoms and signs involving cognitive functions and awareness: Secondary | ICD-10-CM | POA: Diagnosis not present

## 2024-01-16 NOTE — Patient Instructions (Addendum)
 It was a pleasure to see you today at our office.   Recommendations:   Continue memantine  5 mg twice a day  Use hearing aids  Agree with psychotherapy for anxiety and depression Follow up in 1 year     For psychiatric meds, mood meds: Please have your primary care physician manage these medications.  If you have any severe symptoms of a stroke, or other severe issues such as confusion,severe chills or fever, etc call 911 or go to the ER as you may need to be evaluated further    For assessment of decision of mental capacity and competency:  Call Dr. Laverne Potter, geriatric psychiatrist at [phone]  Counseling regarding caregiver distress, including caregiver depression, anxiety and issues regarding community resources, adult day care programs, adult living facilities, or memory care questions:  please contact your  Primary Doctor's Social Worker   Whom to call: Memory  decline, memory medications: Call our office [phone]    [email or web address]   RECOMMENDATIONS FOR ALL PATIENTS WITH MEMORY PROBLEMS: 1. Continue to exercise (Recommend 30 minutes of walking everyday, or 3 hours every week) 2. Increase social interactions - continue going to [place] and enjoy social gatherings with friends and family 3. Eat healthy, avoid fried foods and eat more fruits and vegetables 4. Maintain adequate blood pressure, blood sugar, and blood cholesterol level. Reducing the risk of stroke and cardiovascular disease also helps promoting better memory. 5. Avoid stressful situations. Live a simple life and avoid aggravations. Organize your time and prepare for the next day in anticipation. 6. Sleep well, avoid any interruptions of sleep and avoid any distractions in the bedroom that may interfere with adequate sleep quality 7. Avoid sugar, avoid sweets as there is a strong link between excessive sugar intake, diabetes, and cognitive  impairment We discussed the Mediterranean diet, which has been shown to help patients reduce the risk of progressive memory disorders and reduces cardiovascular risk. This includes eating fish, eat fruits and green leafy vegetables, nuts like almonds and hazelnuts, walnuts, and also use olive oil. Avoid fast foods and fried foods as much as possible. Avoid sweets and sugar as sugar use has been linked to worsening of memory function.  There is always a concern of gradual progression of memory problems. If this is the case, then we may need to adjust level of care according to patient needs. Support, both to the patient and caregiver, should then be put into place.      You have been referred for a neuropsychological evaluation (i.e., evaluation of memory and thinking abilities). Please bring someone with you to this appointment if possible, as it is helpful for the doctor to hear from both you and another adult who knows you well. Please bring eyeglasses and hearing aids if you wear them.    The evaluation will take approximately 3 hours and has two parts:   The first part is a clinical interview with the neuropsychologist (Dr. Kitty Perkins or Dr. Donavon Fudge). During the interview, the neuropsychologist will speak with you and the individual you brought to the appointment.    The second part of the evaluation is testing with the doctor's technician Bernabe Brew or Burdette Carolin). During the testing, the technician will ask you to remember different types of material, solve problems, and answer some questionnaires. Your family member will not be present for this portion of the evaluation.   Please note: We must reserve several hours of the neuropsychologist's time and the psychometrician's time for your evaluation appointment.  As such, there is a No-Show fee of $100. If you are unable to attend any of your appointments, please contact our office as soon as possible to reschedule.      DRIVING: Regarding driving, in patients  with progressive memory problems, driving will be impaired. We advise to have someone else do the driving if trouble finding directions or if minor accidents are reported. Independent driving assessment is available to determine safety of driving.   If you are interested in the driving assessment, you can contact the following:  [facility] in [place] [phone]  [facility] [phone]  [facility] [phone]  [facility] [phone] or [phone]   FALL PRECAUTIONS: Be cautious when walking. Scan the area for obstacles that may increase the risk of trips and falls. When getting up in the mornings, sit up at the edge of the bed for a few minutes before getting out of bed. Consider elevating the bed at the head end to avoid drop of blood pressure when getting up. Walk always in a well-lit room (use night lights in the walls). Avoid area rugs or power cords from appliances in the middle of the walkways. Use a walker or a cane if necessary and consider physical therapy for balance exercise. Get your eyesight checked regularly.  FINANCIAL OVERSIGHT: Supervision, especially oversight when making financial decisions or transactions is also recommended.  HOME SAFETY: Consider the safety of the kitchen when operating appliances like stoves, microwave oven, and blender. Consider having supervision and share cooking responsibilities until no longer able to participate in those. Accidents with firearms and other hazards in the house should be identified and addressed as well.   ABILITY TO BE LEFT ALONE: If patient is unable to contact 911 operator, consider using LifeLine, or when the need is there, arrange for someone to stay with patients. Smoking is a fire hazard, consider supervision or cessation. Risk of wandering should be assessed by caregiver and if detected at any point, supervision and safe proof recommendations should be  instituted.  MEDICATION SUPERVISION: Inability to self-administer medication needs to be constantly addressed. Implement a mechanism to ensure safe administration of the medications.      Mediterranean Diet A Mediterranean diet refers to food and lifestyle choices that are based on the traditions of countries located on the [employer]. This way of eating has been shown to help prevent certain conditions and improve outcomes for people who have chronic diseases, like kidney disease and heart disease. What are tips for following this plan? Lifestyle  Cook and eat meals together with your family, when possible. Drink enough fluid to keep your urine clear or pale yellow. Be physically active every day. This includes: Aerobic exercise like running or swimming. Leisure activities like gardening, walking, or housework. Get 7-8 hours of sleep each night. If recommended by your health care provider, drink red wine in moderation. This means 1 glass a day for nonpregnant women and 2 glasses a day for men. A glass of wine equals 5 oz (150 mL). Reading food labels  Check the serving size of packaged foods. For foods such as rice and pasta, the serving size refers to the amount of cooked product, not dry. Check the total fat in packaged foods. Avoid foods that have saturated fat or trans fats. Check the ingredients list for added sugars, such as corn syrup. Shopping  At the grocery store, buy most of your food from the areas near the walls of the store.  This includes: Fresh fruits and vegetables (produce). Grains, beans, nuts, and seeds. Some of these may be available in unpackaged forms or large amounts (in bulk). Fresh seafood. Poultry and eggs. Low-fat dairy products. Buy whole ingredients instead of prepackaged foods. Buy fresh fruits and vegetables in-season from local farmers markets. Buy frozen fruits and vegetables in resealable bags. If you do not have access to quality fresh  seafood, buy precooked frozen shrimp or canned fish, such as tuna, salmon, or sardines. Buy small amounts of raw or cooked vegetables, salads, or olives from the deli or salad bar at your store. Stock your pantry so you always have certain foods on hand, such as olive oil, canned tuna, canned tomatoes, rice, pasta, and beans. Cooking  Cook foods with extra-virgin olive oil instead of using butter or other vegetable oils. Have meat as a side dish, and have vegetables or grains as your main dish. This means having meat in small portions or adding small amounts of meat to foods like pasta or stew. Use beans or vegetables instead of meat in common dishes like chili or lasagna. Experiment with different cooking methods. Try roasting or broiling vegetables instead of steaming or sauteing them. Add frozen vegetables to soups, stews, pasta, or rice. Add nuts or seeds for added healthy fat at each meal. You can add these to yogurt, salads, or vegetable dishes. Marinate fish or vegetables using olive oil, lemon juice, garlic, and fresh herbs. Meal planning  Plan to eat 1 vegetarian meal one day each week. Try to work up to 2 vegetarian meals, if possible. Eat seafood 2 or more times a week. Have healthy snacks readily available, such as: Vegetable sticks with hummus. Greek yogurt. Fruit and nut trail mix. Eat balanced meals throughout the week. This includes: Fruit: 2-3 servings a day Vegetables: 4-5 servings a day Low-fat dairy: 2 servings a day Fish, poultry, or lean meat: 1 serving a day Beans and legumes: 2 or more servings a week Nuts and seeds: 1-2 servings a day Whole grains: 6-8 servings a day Extra-virgin olive oil: 3-4 servings a day Limit red meat and sweets to only a few servings a month What are my food choices? Mediterranean diet Recommended Grains: Whole-grain pasta. Brown rice. Bulgar wheat. Polenta. Couscous. Whole-wheat bread. Dwyane Glad. Vegetables: Artichokes. Beets.  Broccoli. Cabbage. Carrots. Eggplant. Green beans. Chard. Kale. Spinach. Onions. Leeks. Peas. Squash. Tomatoes. Peppers. Radishes. Fruits: Apples. Apricots. Avocado. Berries. Bananas. Cherries. Dates. Figs. Grapes. Lemons. Melon. Oranges. Peaches. Plums. Pomegranate. Meats and other protein foods: Beans. Almonds. Sunflower seeds. Pine nuts. Peanuts. Cod. Salmon. Scallops. Shrimp. Tuna. Tilapia. Clams. Oysters. Eggs. Dairy: Low-fat milk. Cheese. Greek yogurt. Beverages: Water. Red wine. Herbal tea. Fats and oils: Extra virgin olive oil. Avocado oil. Grape seed oil. Sweets and desserts: [place] yogurt with honey. Baked apples. Poached pears. Trail mix. Seasoning and other foods: Basil. Cilantro. Coriander. Cumin. Mint. Parsley. Sage. Rosemary. Tarragon. Garlic. Oregano. Thyme. Pepper. Balsalmic vinegar. Tahini. Hummus. Tomato sauce. Olives. Mushrooms. Limit these Grains: Prepackaged pasta or rice dishes. Prepackaged cereal with added sugar. Vegetables: Deep fried potatoes (french fries). Fruits: Fruit canned in syrup. Meats and other protein foods: Beef. Pork. Lamb. Poultry with skin. Hot dogs. Helene Loader. Dairy: Ice cream. Sour cream. Whole milk. Beverages: Juice. Sugar-sweetened soft drinks. Beer. Liquor and spirits. Fats and oils: Butter. Canola oil. Vegetable oil. Beef fat (tallow). Lard. Sweets and desserts: Cookies. Cakes. Pies. Candy. Seasoning and other foods: Mayonnaise. Premade sauces and marinades. The items listed may not be  a complete list. Talk with your dietitian about what dietary choices are right for you. Summary The Mediterranean diet includes both food and lifestyle choices. Eat a variety of fresh fruits and vegetables, beans, nuts, seeds, and whole grains. Limit the amount of red meat and sweets that you eat. Talk with your health care provider about whether it is safe for you to drink red wine in moderation. This means 1 glass a day for nonpregnant women and 2 glasses a day for  men. A glass of wine equals 5 oz (150 mL). This information is not intended to replace advice given to you by your health care provider. Make sure you discuss any questions you have with your health care provider. Document Released: 03/16/2016 Document Revised: 04/18/2016 Document Reviewed: 03/16/2016 Elsevier Interactive Patient Education  2017 [employer].

## 2024-01-17 ENCOUNTER — Encounter (HOSPITAL_BASED_OUTPATIENT_CLINIC_OR_DEPARTMENT_OTHER): Payer: Self-pay

## 2024-01-17 ENCOUNTER — Ambulatory Visit (HOSPITAL_BASED_OUTPATIENT_CLINIC_OR_DEPARTMENT_OTHER)

## 2024-01-17 DIAGNOSIS — R2689 Other abnormalities of gait and mobility: Secondary | ICD-10-CM

## 2024-01-17 DIAGNOSIS — M25561 Pain in right knee: Secondary | ICD-10-CM | POA: Diagnosis not present

## 2024-01-17 DIAGNOSIS — R29898 Other symptoms and signs involving the musculoskeletal system: Secondary | ICD-10-CM | POA: Diagnosis not present

## 2024-01-17 DIAGNOSIS — M25551 Pain in right hip: Secondary | ICD-10-CM | POA: Diagnosis not present

## 2024-01-17 DIAGNOSIS — M25552 Pain in left hip: Secondary | ICD-10-CM | POA: Diagnosis not present

## 2024-01-17 DIAGNOSIS — M6281 Muscle weakness (generalized): Secondary | ICD-10-CM

## 2024-01-17 DIAGNOSIS — M25562 Pain in left knee: Secondary | ICD-10-CM

## 2024-01-17 NOTE — Therapy (Signed)
 OUTPATIENT PHYSICAL THERAPY TREATMENT       Patient Name: [name] MRN: [id number] DOB:01/18/1946, 78 y.o., female Today's Date: 01/17/2024  END OF SESSION:  PT End of Session - 01/17/24 1342     Visit Number 12    Number of Visits 32    Date for PT Re-Evaluation 03/11/24    Authorization Type MEDICARE    Progress Note Due on Visit 20    PT Start Time 1342    PT Stop Time 1430    PT Time Calculation (min) 48 min    Activity Tolerance Patient tolerated treatment well    Behavior During Therapy WFL for tasks assessed/performed                 Past Medical History:  Diagnosis Date   ARTHRITIS, HIPS, BILATERAL    Atrial fibrillation (HCC)    chronic anticoag - Pradaxa    BURSITIS, SUBTROCHANTERIC    HYPERTENSION    MITRAL VALVE PROLAPSE    Mixed hyperlipidemia    Past Surgical History:  Procedure Laterality Date   DILATION AND CURETTAGE OF UTERUS     TONSILLECTOMY     Patient Active Problem List   Diagnosis Date Noted   Wrist pain, acute, right 11/30/2023   Thyroid  nodule 11/29/2023   Fall 11/29/2023   Head trauma 11/29/2023   Anxiety 10/11/2023   Memory impairment 10/03/2023   Intermittent right-sided chest pain 07/25/2023   Pruritus - legs 01/22/2023   Vitamin D  deficiency 01/22/2022   Secondary hypercoagulable state (HCC) 11/17/2020   Nasal lesion 11/08/2020   B12 deficiency 10/28/2019   IBS (irritable bowel syndrome) 10/28/2019   Arthritis of right hand 07/22/2019   Osteoarthritis of hand 06/05/2019   Presbycusis of both ears 05/14/2018   ETD (Eustachian tube dysfunction), bilateral 04/12/2018   Prediabetes 05/15/2017   Pain of right heel 01/09/2017   Primary osteoarthritis of both feet 07/10/2016   Primary osteoarthritis of both hands 07/10/2016   Alopecia totalis 06/05/2016   Dermatochalasis of both upper eyelids 10/12/2015   H/O dysplastic nevus 08/03/2015   Degenerative lumbar disc 12/04/2013   Hematuria, undiagnosed cause  09/02/2013   Osteopenia 06/11/2013   Primary generalized (osteo)arthritis 12/18/2011   Mixed hyperlipidemia 03/25/2009   Essential hypertension 03/24/2009   ATRIAL FIBRILLATION 03/24/2009   Greater trochanteric bursitis of left hip 02/12/2007    PCP: Colene Dauphin, MD  REFERRING PROVIDER: Clyde Darling, DO  REFERRING DIAG: [phone] (ICD-10-CM) - Pain in left hip R26.2 (ICD-10-CM) - Difficulty in walking, not elsewhere classified Bilateral OA of hip, bilateral OA of knee, pain in R hip, pain in L hip  THERAPY DIAG:  Bilateral hip pain  Pain in both knees, unspecified chronicity  Muscle weakness (generalized)  Other symptoms and signs involving the musculoskeletal system  Other abnormalities of gait and mobility  Rationale for Evaluation and Treatment: Rehabilitation  ONSET DATE: chronic   SUBJECTIVE:   SUBJECTIVE STATEMENT: Pt reports no pain at entry. Having xray soon for L knee.   EVAL: Patient states she just joined the gym a few weeks ago. Then had a problem with her hip. Ruptured disc in back twice. Patient states hip pain for a long time. Shots helped a little in R. R hip is worse than the left. On way to [place]  she had a pain in R glute where she had to go to ER. L hip has some pain as well. Symptoms are worse with prolonged walking. Wants to able to workout  again, eager to get stronger. Had weak abductors in the past. Legs are very weak and has trouble with stairs. L knee bothers her and troubles with squats and lunges.  PERTINENT HISTORY: hypertension, bilateral hip arthritis, atrial fibrillation  PAIN:  Are you having pain? No at rest  PRECAUTIONS: None  WEIGHT BEARING RESTRICTIONS: No  FALLS:  Has patient fallen in last 6 months? No  PLOF: Independent  PATIENT GOALS: get stronger, exercise   OBJECTIVE: (objective measures from initial evaluation unless otherwise dated)  PATIENT SURVEYS:  LEFS 31/80            6/5: 43/80 COGNITION: Overall  cognitive status: Within functional limits for tasks assessed     SENSATION: WFL  POSTURE: No Significant postural limitations  PALPATION: Grossly tender bilateral glutes  LOWER EXTREMITY ROM:  Active ROM Right eval Left eval  Hip flexion    Hip extension    Hip abduction    Hip adduction    Hip internal rotation    Hip external rotation    Knee flexion    Knee extension    Ankle dorsiflexion    Ankle plantarflexion    Ankle inversion    Ankle eversion     (Blank rows = not tested) *= pain/symptoms  LOWER EXTREMITY MMT:  MMT Right eval Left eval Right 6/5 Left 6/5  Hip flexion 4 4- 4 4-  Hip extension 3 3 3+ 4-  Hip abduction 3- 3 3+ 4+  Hip adduction      Hip internal rotation      Hip external rotation      Knee flexion 4+ 4+ 5 5  Knee extension 4+ 4+ 4+ 5  Ankle dorsiflexion      Ankle plantarflexion      Ankle inversion      Ankle eversion       (Blank rows = not tested) *= pain/symptoms    FUNCTIONAL TESTS:  5 times sit to stand: 31.35 seconds with hands on thighs, labored, relies L>RLE, R valgus Stairs: alternating pattern, 7 inch steps, unilateral HHA, L knee pain, decreased concentric and eccentric strength and motor control bilaterally, increased valgus on R  6/5: 5xSTS: 11.4seconds Stairs: single HHA, improved control though mild offloading noted of R LE   GAIT: Distance walked: 100 feet Assistive device utilized: None Level of assistance: Complete Independence Comments: R truncal lean on R stance, decreased hip extension bilaterally   TODAY'S TREATMENT:                                                                                                                              DATE:   01/17/24 Nustep 5 minutes LE L4 only for dynamic warm up  March on airex 2 x 10 Step up over airex 2 x 10 bilateral SLS with 3 way reach on airex 5 x 5 second holds Hip hikes 4 step 2x10ea (cues for proper performance  Semi tandem on airex  with corss  body reaches Lateral step up 6 inch 2 x 10 RLE only- L knee pain Standing hip abd/ext with RTB at ankles 2x10ea Sidleying hip abduction 2x10ea Bridges 2x10    01/15/24 Nustep 5 minutes LE L4 only for dynamic warm up  March on airex 2 x 10 Step up over airex 2 x 10 bilateral SLS with 3 way reach on airex 5 x 5 second holds Lateral step up 6 inch 2 x 10  Lateral lunge 1 x 10 RLE , L attempted but poor mechanics and crepitus and pain   01/10/24 Nustep 4 minutes LE L4 only for dynamic warm up (had increased pain with this today) Updated MMT LEFS 5xSTS Reviewed goals   Marching on airex (slow) NBOS on ariex with EC Semi tandem on airex with corss body reaches   01/08/24 Nustep 5 minutes LE L4 only for dynamic warm up Step up 6 inch 2 x 10  Lateral step up 6 inch 2 x 10  Standing hip abduction/extension RTB at ankles 2 x 10ea Sit to stands 2x10  Marching on airex (slow) NBOS airex with head turns Semi tandem on airex 2 x 30 second holds ea [facility] taps for balance-3cones x5ea bil    PATIENT EDUCATION:  Education details: Patient educated on exam findings, POC, scope of PT, HEP, relevant anatomy and pathology. 12/14/23: HEP Person educated: Patient Education method: Explanation, Demonstration, and Handouts Education comprehension: verbalized understanding, returned demonstration, verbal cues required, and tactile cues required  HOME EXERCISE PROGRAM: Access Code: [email or web address] URL: [email or web address] Date: 11/22/2023 Prepared by: Debria Fang Zaunegger  Exercises - Supine Bridge  - 1 x daily - 7 x weekly - 2 sets - 10 reps - Clamshell  - 1 x daily - 7 x weekly - 2 sets - 10 reps  ASSESSMENT:  CLINICAL IMPRESSION: Continued with LE strengthening and balance training. Intermittent unsteadiness during session. Patient will continue to benefit from physical therapy in order to improve function and reduce impairment.     OBJECTIVE IMPAIRMENTS: Abnormal gait,  decreased activity tolerance, decreased balance, decreased endurance, decreased mobility, difficulty walking, decreased ROM, decreased strength, increased muscle spasms, impaired flexibility, improper body mechanics, and pain.   ACTIVITY LIMITATIONS: carrying, lifting, bending, standing, squatting, stairs, transfers, locomotion level, and caring for others  PARTICIPATION LIMITATIONS: meal prep, cleaning, laundry, shopping, community activity, and yard work  PERSONAL FACTORS: Time since onset of injury/illness/exacerbation and 3+ comorbidities: HTN, afib, hx hip and knee pain, hx back pain are also affecting patient's functional outcome.   REHAB POTENTIAL: Good  CLINICAL DECISION MAKING: Evolving/moderate complexity  EVALUATION COMPLEXITY: Moderate   GOALS: Goals reviewed with patient? Yes  SHORT TERM GOALS: Target date: 12/20/2023    Patient will be independent with HEP in order to improve functional outcomes. Baseline: Goal status: MET 6/3  2.  Patient will report at least 25% improvement in symptoms for improved quality of life. Baseline: Goal status: MET 6/3    LONG TERM GOALS: Target date: 01/17/2024    Patient will report at least 75% improvement in symptoms for improved quality of life. Baseline:  Goal status: IN PROGRESS (50% 6/5)  2.  Patient will improve LEFS score by at least 9 points in order to indicate improved tolerance to activity. Baseline: 31/80 Goal status: MET 6/5 (43/80)  3.  Patient will be able to navigate stairs with reciprocal pattern without compensation in order to demonstrate improved LE strength. Baseline:  Goal status: IN PROGRESS (only mild compensation 6/5)  4.  Patient will demonstrate grade of 5/5 MMT grade in all tested musculature as evidence of improved strength to assist with stair ambulation and gait.  Baseline:  Goal status: IN PROGRESS 6/5  5.  Patient will be able to complete 5x STS in under 15 seconds in order to demo improved  functional strength. Baseline:  Goal status: MET 6/5  6.  Patient will be able to perform full gym routine independently.  Baseline:  Goal status: INITIAL (has not yet returned to gym. 6/5)   PLAN:  PT FREQUENCY: 2x/week  PT DURATION: 8 weeks  PLANNED INTERVENTIONS: 97164- PT Re-evaluation, 97110-Therapeutic exercises, 97530- Therapeutic activity, 97112- Neuromuscular re-education, 97535- Self Care, 69629- Manual therapy, [phone]- Gait training, [phone]- Orthotic Fit/training, [phone]- Canalith repositioning, [id number]- Aquatic Therapy, [phone]- Splinting, Patient/Family education, Balance training, Stair training, Taping, Dry Needling, Joint mobilization, Joint manipulation, Spinal manipulation, Spinal mobilization, Scar mobilization, and DME instructions.  PLAN FOR NEXT SESSION: f/u with HEP, glute and quad strength, functional strength, progress to gym routine as able    Judie Noun, PTA 01/17/2024, 2:54 PM

## 2024-01-21 ENCOUNTER — Ambulatory Visit (HOSPITAL_BASED_OUTPATIENT_CLINIC_OR_DEPARTMENT_OTHER)
Admission: RE | Admit: 2024-01-21 | Discharge: 2024-01-21 | Disposition: A | Discharge status: Home / Self Care | Source: Ambulatory Visit | Attending: Sports Medicine | Admitting: Sports Medicine

## 2024-01-21 DIAGNOSIS — M25462 Effusion, left knee: Secondary | ICD-10-CM | POA: Diagnosis not present

## 2024-01-21 DIAGNOSIS — M25562 Pain in left knee: Secondary | ICD-10-CM | POA: Insufficient documentation

## 2024-01-21 DIAGNOSIS — M1712 Unilateral primary osteoarthritis, left knee: Secondary | ICD-10-CM | POA: Diagnosis not present

## 2024-01-21 DIAGNOSIS — G8929 Other chronic pain: Secondary | ICD-10-CM | POA: Insufficient documentation

## 2024-01-22 ENCOUNTER — Encounter (HOSPITAL_BASED_OUTPATIENT_CLINIC_OR_DEPARTMENT_OTHER): Payer: Self-pay

## 2024-01-22 ENCOUNTER — Ambulatory Visit (HOSPITAL_BASED_OUTPATIENT_CLINIC_OR_DEPARTMENT_OTHER)

## 2024-01-22 DIAGNOSIS — M25552 Pain in left hip: Secondary | ICD-10-CM | POA: Diagnosis not present

## 2024-01-22 DIAGNOSIS — R29898 Other symptoms and signs involving the musculoskeletal system: Secondary | ICD-10-CM

## 2024-01-22 DIAGNOSIS — M25562 Pain in left knee: Secondary | ICD-10-CM | POA: Diagnosis not present

## 2024-01-22 DIAGNOSIS — M25561 Pain in right knee: Secondary | ICD-10-CM | POA: Diagnosis not present

## 2024-01-22 DIAGNOSIS — R2689 Other abnormalities of gait and mobility: Secondary | ICD-10-CM

## 2024-01-22 DIAGNOSIS — M6281 Muscle weakness (generalized): Secondary | ICD-10-CM | POA: Diagnosis not present

## 2024-01-22 DIAGNOSIS — M25551 Pain in right hip: Secondary | ICD-10-CM | POA: Diagnosis not present

## 2024-01-22 NOTE — Progress Notes (Signed)
 Subjective:    Patient ID: [name], female    DOB: [birth year], 78 y.o.   MRN: [id number]     HPI Buelah is here for follow up of her chronic medical problems.  Doing PT for b/l knee OA.    Medications and allergies reviewed with patient and updated if appropriate.  Current Outpatient Medications on File Prior to Visit  Medication Sig Dispense Refill   acetaminophen  (TYLENOL ) 650 MG CR tablet Take 650 mg by mouth 2 (two) times daily.      bifidobacterium infantis (ALIGN) capsule Take 1 capsule by mouth daily. 30 capsule 11   cholecalciferol (VITAMIN D ) 1000 UNITS tablet Take 2,000 Units by mouth daily.     co-enzyme Q-10 50 MG capsule Take 50 mg by mouth daily.     diltiazem  (CARDIZEM  CD) 180 MG 24 hr capsule TAKE 1 CAPSULE BY MOUTH TWICE A DAY 180 capsule 3   diltiazem  (CARDIZEM ) 30 MG tablet TAKE 1 TABLET EVERY 4 HOURS AS NEEDED FOR AFIB HEART RATE >100 30 tablet 2   DULoxetine  (CYMBALTA ) 30 MG capsule Take 1 capsule (30 mg total) by mouth daily. 90 capsule 1   hydrochlorothiazide  (HYDRODIURIL ) 25 MG tablet TAKE 1 TABLET (25 MG TOTAL) BY MOUTH DAILY. 90 tablet 1   HYDROcodone -acetaminophen  (NORCO/VICODIN) 5-325 MG tablet Take 1 tablet by mouth every 6 (six) hours as needed for severe pain (pain score 7-10). 20 tablet 0   KLOR-CON  M10 10 MEQ tablet TAKE 1 TABLET BY MOUTH THREE TIMES A DAY (Patient taking differently: Take 10 mEq by mouth daily.) 270 tablet 1   lisinopril  (ZESTRIL ) 40 MG tablet TAKE 1 TABLET BY MOUTH EVERY DAY 90 tablet 1   magnesium oxide (MAG-OX) 400 MG tablet Take 400 mg by mouth daily.     memantine  (NAMENDA ) 5 MG tablet Take 1 tablet (5 mg at night) for 2 weeks, then increase to 1 tablet (5 mg) twice a day 180 tablet 3   pravastatin  (PRAVACHOL ) 40 MG tablet Take 1 tablet (40 mg total) by mouth daily. 90 tablet 3   rivaroxaban  (XARELTO ) 20 MG TABS tablet Take 1 tablet (20 mg total) by mouth daily with supper. 90 tablet 1   No current  facility-administered medications on file prior to visit.     Review of Systems  Constitutional:  Negative for fever.  Respiratory:  Positive for shortness of breath (with strenuous activity only). Negative for cough and wheezing.   Cardiovascular:  Negative for chest pain, palpitations and leg swelling.  Neurological:  Negative for light-headedness and headaches.       Objective:   Vitals:   01/23/24 0812  BP: 116/74  Pulse: 75  Temp: 98 F (36.7 C)  SpO2: 96%   BP Readings from Last 3 Encounters:  01/23/24 116/74  01/16/24 (!) 123/46  11/30/23 136/80   Wt Readings from Last 3 Encounters:  01/23/24 169 lb (76.7 kg)  01/16/24 168 lb (76.2 kg)  11/30/23 169 lb (76.7 kg)   Body mass index is 29.01 kg/m.    Physical Exam Constitutional:      General: She is not in acute distress.    Appearance: Normal appearance.  HENT:     Head: Normocephalic and atraumatic.   Eyes:     Conjunctiva/sclera: Conjunctivae normal.    Cardiovascular:     Rate and Rhythm: Normal rate and regular rhythm.     Heart sounds: Normal heart sounds.  Pulmonary:  Effort: Pulmonary effort is normal. No respiratory distress.     Breath sounds: Normal breath sounds. No wheezing.   Musculoskeletal:     Cervical back: Neck supple.     Right lower leg: No edema.     Left lower leg: No edema.  Lymphadenopathy:     Cervical: No cervical adenopathy.   Skin:    General: Skin is warm and dry.     Findings: No rash.   Neurological:     Mental Status: She is alert. Mental status is at baseline.   Psychiatric:        Mood and Affect: Mood normal.        Behavior: Behavior normal.        Lab Results  Component Value Date   WBC 7.8 07/25/2023   HGB 14.4 07/25/2023   HCT 43.0 07/25/2023   PLT 306.0 07/25/2023   GLUCOSE 87 07/25/2023   CHOL 177 07/25/2023   TRIG 204.0 (H) 07/25/2023   HDL 50.60 07/25/2023   LDLDIRECT 87.0 01/23/2022   LDLCALC 86 07/25/2023   ALT 10 07/25/2023    AST 16 07/25/2023   NA 140 07/25/2023   K 4.2 07/25/2023   CL 103 07/25/2023   CREATININE 0.82 07/25/2023   BUN 22 07/25/2023   CO2 28 07/25/2023   TSH 1.42 10/03/2023   INR 1.4 01/25/2010   HGBA1C 6.1 07/25/2023     Assessment & Plan:    See Problem List for Assessment and Plan of chronic medical problems.

## 2024-01-22 NOTE — Therapy (Signed)
 OUTPATIENT PHYSICAL THERAPY TREATMENT       Patient Name: [name] MRN: [id number] DOB:1946/04/18, 78 y.o., female Today's Date: 01/22/2024  END OF SESSION:  PT End of Session - 01/22/24 1307     Visit Number 13    Number of Visits 32    Date for PT Re-Evaluation 03/11/24    Authorization Type MEDICARE    Progress Note Due on Visit 20    PT Start Time 1303    PT Stop Time 1342    PT Time Calculation (min) 39 min    Activity Tolerance Patient tolerated treatment well    Behavior During Therapy WFL for tasks assessed/performed                  Past Medical History:  Diagnosis Date   ARTHRITIS, HIPS, BILATERAL    Atrial fibrillation (HCC)    chronic anticoag - Pradaxa    BURSITIS, SUBTROCHANTERIC    HYPERTENSION    MITRAL VALVE PROLAPSE    Mixed hyperlipidemia    Past Surgical History:  Procedure Laterality Date   DILATION AND CURETTAGE OF UTERUS     TONSILLECTOMY     Patient Active Problem List   Diagnosis Date Noted   Wrist pain, acute, right 11/30/2023   Thyroid  nodule 11/29/2023   Fall 11/29/2023   Head trauma 11/29/2023   Anxiety 10/11/2023   Memory impairment 10/03/2023   Intermittent right-sided chest pain 07/25/2023   Pruritus - legs 01/22/2023   Vitamin D  deficiency 01/22/2022   Secondary hypercoagulable state (HCC) 11/17/2020   Nasal lesion 11/08/2020   B12 deficiency 10/28/2019   IBS (irritable bowel syndrome) 10/28/2019   Arthritis of right hand 07/22/2019   Osteoarthritis of hand 06/05/2019   Presbycusis of both ears 05/14/2018   ETD (Eustachian tube dysfunction), bilateral 04/12/2018   Prediabetes 05/15/2017   Pain of right heel 01/09/2017   Primary osteoarthritis of both feet 07/10/2016   Primary osteoarthritis of both hands 07/10/2016   Alopecia totalis 06/05/2016   Dermatochalasis of both upper eyelids 10/12/2015   H/O dysplastic nevus 08/03/2015   Degenerative lumbar disc 12/04/2013   Hematuria, undiagnosed cause  09/02/2013   Osteopenia 06/11/2013   Primary generalized (osteo)arthritis 12/18/2011   Mixed hyperlipidemia 03/25/2009   Essential hypertension 03/24/2009   ATRIAL FIBRILLATION 03/24/2009   Greater trochanteric bursitis of left hip 02/12/2007    PCP: Colene Dauphin, MD  REFERRING PROVIDER: Clyde Darling, DO  REFERRING DIAG: [phone] (ICD-10-CM) - Pain in left hip R26.2 (ICD-10-CM) - Difficulty in walking, not elsewhere classified Bilateral OA of hip, bilateral OA of knee, pain in R hip, pain in L hip  THERAPY DIAG:  Bilateral hip pain  Pain in both knees, unspecified chronicity  Muscle weakness (generalized)  Other symptoms and signs involving the musculoskeletal system  Other abnormalities of gait and mobility  Rationale for Evaluation and Treatment: Rehabilitation  ONSET DATE: chronic   SUBJECTIVE:   SUBJECTIVE STATEMENT: Had x ray for L knee. Has f/u for July. She was very sore in her muscles after last session.  EVAL: Patient states she just joined the gym a few weeks ago. Then had a problem with her hip. Ruptured disc in back twice. Patient states hip pain for a long time. Shots helped a little in R. R hip is worse than the left. On way to [place]  she had a pain in R glute where she had to go to ER. L hip has some pain as well. Symptoms are worse  with prolonged walking. Wants to able to workout again, eager to get stronger. Had weak abductors in the past. Legs are very weak and has trouble with stairs. L knee bothers her and troubles with squats and lunges.  PERTINENT HISTORY: hypertension, bilateral hip arthritis, atrial fibrillation  PAIN:  Are you having pain? No at rest  PRECAUTIONS: None  WEIGHT BEARING RESTRICTIONS: No  FALLS:  Has patient fallen in last 6 months? No  PLOF: Independent  PATIENT GOALS: get stronger, exercise   OBJECTIVE: (objective measures from initial evaluation unless otherwise dated)  PATIENT SURVEYS:  LEFS 31/80             6/5: 43/80 COGNITION: Overall cognitive status: Within functional limits for tasks assessed     SENSATION: WFL  POSTURE: No Significant postural limitations  PALPATION: Grossly tender bilateral glutes  LOWER EXTREMITY ROM:  Active ROM Right eval Left eval  Hip flexion    Hip extension    Hip abduction    Hip adduction    Hip internal rotation    Hip external rotation    Knee flexion    Knee extension    Ankle dorsiflexion    Ankle plantarflexion    Ankle inversion    Ankle eversion     (Blank rows = not tested) *= pain/symptoms  LOWER EXTREMITY MMT:  MMT Right eval Left eval Right 6/5 Left 6/5  Hip flexion 4 4- 4 4-  Hip extension 3 3 3+ 4-  Hip abduction 3- 3 3+ 4+  Hip adduction      Hip internal rotation      Hip external rotation      Knee flexion 4+ 4+ 5 5  Knee extension 4+ 4+ 4+ 5  Ankle dorsiflexion      Ankle plantarflexion      Ankle inversion      Ankle eversion       (Blank rows = not tested) *= pain/symptoms    FUNCTIONAL TESTS:  5 times sit to stand: 31.35 seconds with hands on thighs, labored, relies L>RLE, R valgus Stairs: alternating pattern, 7 inch steps, unilateral HHA, L knee pain, decreased concentric and eccentric strength and motor control bilaterally, increased valgus on R  6/5: 5xSTS: 11.4seconds Stairs: single HHA, improved control though mild offloading noted of R LE   GAIT: Distance walked: 100 feet Assistive device utilized: None Level of assistance: Complete Independence Comments: R truncal lean on R stance, decreased hip extension bilaterally   TODAY'S TREATMENT:                                                                                                                              DATE:   01/22/24 Nustep 5 minutes LE L4 only for dynamic warm up  March on airex 2 x 10 Step up over airex 2 x 10 bilateral SLS with 3 way reach on airex 5 x 5 second holds Hip hikes 4 step 2x10ea (cues  for proper performance   Semi tandem on airex with cross body reaches x10ea Lateral step up 6 inch 2 x 10 RLE only- L knee pain Standing hip abd/ext with RTB at ankles 2x10ea Sidleying hip abduction 2x10ea Bridges 2x10  01/17/24 Nustep 5 minutes LE L4 only for dynamic warm up  March on airex 2 x 10 Step up over airex 2 x 10 bilateral SLS with 3 way reach on airex 5 x 5 second holds Hip hikes 4 step 2x10ea (cues for proper performance  Semi tandem on airex with corss body reaches Lateral step up 6 inch 2 x 10 RLE only- L knee pain Standing hip abd/ext with RTB at ankles 2x10ea Sidleying hip abduction 2x10ea Bridges 2x10    01/15/24 Nustep 5 minutes LE L4 only for dynamic warm up  March on airex 2 x 10 Step up over airex 2 x 10 bilateral SLS with 3 way reach on airex 5 x 5 second holds Lateral step up 6 inch 2 x 10  Lateral lunge 1 x 10 RLE , L attempted but poor mechanics and crepitus and pain   01/10/24 Nustep 4 minutes LE L4 only for dynamic warm up (had increased pain with this today) Updated MMT LEFS 5xSTS Reviewed goals   Marching on airex (slow) NBOS on ariex with EC Semi tandem on airex with corss body reaches   01/08/24 Nustep 5 minutes LE L4 only for dynamic warm up Step up 6 inch 2 x 10  Lateral step up 6 inch 2 x 10  Standing hip abduction/extension RTB at ankles 2 x 10ea Sit to stands 2x10  Marching on airex (slow) NBOS airex with head turns Semi tandem on airex 2 x 30 second holds ea [place] taps for balance-3cones x5ea bil    PATIENT EDUCATION:  Education details: Patient educated on exam findings, POC, scope of PT, HEP, relevant anatomy and pathology. 12/14/23: HEP Person educated: Patient Education method: Explanation, Demonstration, and Handouts Education comprehension: verbalized understanding, returned demonstration, verbal cues required, and tactile cues required  HOME EXERCISE PROGRAM: Access Code: [email or web address] URL: [email or web address] Date:  11/22/2023 Prepared by: Debria Fang Zaunegger  Exercises - Supine Bridge  - 1 x daily - 7 x weekly - 2 sets - 10 reps - Clamshell  - 1 x daily - 7 x weekly - 2 sets - 10 reps  ASSESSMENT:  CLINICAL IMPRESSION: Pt reported improved balance today with lateral step overs. Held any additional progressions today due to level of soreness after last session. Greater difficutly observed with L s/l hip abduction compared to R LE. Continues to require cues for hip hiking at 4 step. Will continue to progress as tolerated.      OBJECTIVE IMPAIRMENTS: Abnormal gait, decreased activity tolerance, decreased balance, decreased endurance, decreased mobility, difficulty walking, decreased ROM, decreased strength, increased muscle spasms, impaired flexibility, improper body mechanics, and pain.   ACTIVITY LIMITATIONS: carrying, lifting, bending, standing, squatting, stairs, transfers, locomotion level, and caring for others  PARTICIPATION LIMITATIONS: meal prep, cleaning, laundry, shopping, community activity, and yard work  PERSONAL FACTORS: Time since onset of injury/illness/exacerbation and 3+ comorbidities: HTN, afib, hx hip and knee pain, hx back pain are also affecting patient's functional outcome.   REHAB POTENTIAL: Good  CLINICAL DECISION MAKING: Evolving/moderate complexity  EVALUATION COMPLEXITY: Moderate   GOALS: Goals reviewed with patient? Yes  SHORT TERM GOALS: Target date: 12/20/2023    Patient will be independent with HEP in order to improve functional outcomes. Baseline: Goal status: MET 6/3  2.  Patient will report at least 25% improvement in symptoms for improved quality of life. Baseline: Goal status: MET 6/3    LONG TERM GOALS: Target date: 01/17/2024    Patient will report at least 75% improvement in symptoms for improved quality of life. Baseline:  Goal status: IN PROGRESS (50% 6/5)  2.  Patient will improve LEFS score by at least 9 points in order to indicate  improved tolerance to activity. Baseline: 31/80 Goal status: MET 6/5 (43/80)  3.  Patient will be able to navigate stairs with reciprocal pattern without compensation in order to demonstrate improved LE strength. Baseline:  Goal status: IN PROGRESS (only mild compensation 6/5)  4.  Patient will demonstrate grade of 5/5 MMT grade in all tested musculature as evidence of improved strength to assist with stair ambulation and gait.  Baseline:  Goal status: IN PROGRESS 6/5  5.  Patient will be able to complete 5x STS in under 15 seconds in order to demo improved functional strength. Baseline:  Goal status: MET 6/5  6.  Patient will be able to perform full gym routine independently.  Baseline:  Goal status: INITIAL (has not yet returned to gym. 6/5)   PLAN:  PT FREQUENCY: 2x/week  PT DURATION: 8 weeks  PLANNED INTERVENTIONS: 97164- PT Re-evaluation, 97110-Therapeutic exercises, 97530- Therapeutic activity, 97112- Neuromuscular re-education, 97535- Self Care, 82956- Manual therapy, [phone]- Gait training, [phone]- Orthotic Fit/training, [phone]- Canalith repositioning, [id number]- Aquatic Therapy, [phone]- Splinting, Patient/Family education, Balance training, Stair training, Taping, Dry Needling, Joint mobilization, Joint manipulation, Spinal manipulation, Spinal mobilization, Scar mobilization, and DME instructions.  PLAN FOR NEXT SESSION: f/u with HEP, glute and quad strength, functional strength, progress to gym routine as able    Judie Noun, PTA 01/22/2024, 1:50 PM

## 2024-01-22 NOTE — Patient Instructions (Addendum)
      Blood work was ordered.       Medications changes include :   None      Return in about 6 months (around 07/24/2024) for follow up.

## 2024-01-23 ENCOUNTER — Ambulatory Visit (INDEPENDENT_AMBULATORY_CARE_PROVIDER_SITE_OTHER): Payer: Medicare Other | Admitting: Internal Medicine

## 2024-01-23 ENCOUNTER — Encounter: Payer: Self-pay | Admitting: Internal Medicine

## 2024-01-23 VITALS — BP 116/74 | HR 75 | Temp 98.0°F | Ht 64.0 in | Wt 169.0 lb

## 2024-01-23 DIAGNOSIS — E559 Vitamin D deficiency, unspecified: Secondary | ICD-10-CM

## 2024-01-23 DIAGNOSIS — E538 Deficiency of other specified B group vitamins: Secondary | ICD-10-CM | POA: Diagnosis not present

## 2024-01-23 DIAGNOSIS — R7303 Prediabetes: Secondary | ICD-10-CM | POA: Diagnosis not present

## 2024-01-23 DIAGNOSIS — R413 Other amnesia: Secondary | ICD-10-CM

## 2024-01-23 DIAGNOSIS — F419 Anxiety disorder, unspecified: Secondary | ICD-10-CM | POA: Diagnosis not present

## 2024-01-23 DIAGNOSIS — M150 Primary generalized (osteo)arthritis: Secondary | ICD-10-CM | POA: Diagnosis not present

## 2024-01-23 DIAGNOSIS — E782 Mixed hyperlipidemia: Secondary | ICD-10-CM | POA: Diagnosis not present

## 2024-01-23 DIAGNOSIS — I10 Essential (primary) hypertension: Secondary | ICD-10-CM | POA: Diagnosis not present

## 2024-01-23 DIAGNOSIS — I4821 Permanent atrial fibrillation: Secondary | ICD-10-CM | POA: Diagnosis not present

## 2024-01-23 DIAGNOSIS — M8580 Other specified disorders of bone density and structure, unspecified site: Secondary | ICD-10-CM | POA: Diagnosis not present

## 2024-01-23 DIAGNOSIS — D6869 Other thrombophilia: Secondary | ICD-10-CM

## 2024-01-23 LAB — COMPREHENSIVE METABOLIC PANEL WITH GFR
ALT: 12 U/L (ref 0–35)
AST: 18 U/L (ref 0–37)
Albumin: 4.2 g/dL (ref 3.5–5.2)
Alkaline Phosphatase: 87 U/L (ref 39–117)
BUN: 18 mg/dL (ref 6–23)
CO2: 30 meq/L (ref 19–32)
Calcium: 9.5 mg/dL (ref 8.4–10.5)
Chloride: 104 meq/L (ref 96–112)
Creatinine, Ser: 0.81 mg/dL (ref 0.40–1.20)
GFR: 70.28 mL/min (ref >60.00)
Glucose, Bld: 96 mg/dL (ref 70–99)
Potassium: 4.2 meq/L (ref 3.5–5.1)
Sodium: 141 meq/L (ref 135–145)
Total Bilirubin: 0.5 mg/dL (ref 0.2–1.2)
Total Protein: 6.9 g/dL (ref 6.0–8.3)

## 2024-01-23 LAB — HEMOGLOBIN A1C: Hgb A1c MFr Bld: 5.3 % (ref 4.6–6.5)

## 2024-01-23 LAB — CBC WITH DIFFERENTIAL/PLATELET
Basophils Absolute: 0.1 10*3/uL (ref 0.0–0.1)
Basophils Relative: 0.9 % (ref 0.0–3.0)
Eosinophils Absolute: 0.1 10*3/uL (ref 0.0–0.7)
Eosinophils Relative: 1.5 % (ref 0.0–5.0)
HCT: 40.9 % (ref 36.0–46.0)
Hemoglobin: 13.7 g/dL (ref 12.0–15.0)
Lymphocytes Relative: 30.7 % (ref 12.0–46.0)
Lymphs Abs: 2.3 10*3/uL (ref 0.7–4.0)
MCHC: 33.4 g/dL (ref 30.0–36.0)
MCV: 92 fl (ref 78.0–100.0)
Monocytes Absolute: 0.7 10*3/uL (ref 0.1–1.0)
Monocytes Relative: 8.7 % (ref 3.0–12.0)
Neutro Abs: 4.4 10*3/uL (ref 1.4–7.7)
Neutrophils Relative %: 58.2 % (ref 43.0–77.0)
Platelets: 247 10*3/uL (ref 150.0–400.0)
RBC: 4.45 Mil/uL (ref 3.87–5.11)
RDW: 13.7 % (ref 11.5–15.5)
WBC: 7.6 10*3/uL (ref 4.0–10.5)

## 2024-01-23 LAB — LIPID PANEL
Cholesterol: 187 mg/dL (ref 0–200)
HDL: 62.9 mg/dL (ref >39.00)
LDL Cholesterol: 102 mg/dL — ABNORMAL HIGH (ref 0–99)
NonHDL: 124.01
Total CHOL/HDL Ratio: 3
Triglycerides: 108 mg/dL (ref 0.0–149.0)
VLDL: 21.6 mg/dL (ref 0.0–40.0)

## 2024-01-23 LAB — VITAMIN D 25 HYDROXY (VIT D DEFICIENCY, FRACTURES): VITD: 56.94 ng/mL (ref 30.00–100.00)

## 2024-01-23 LAB — TSH: TSH: 1.46 u[IU]/mL (ref 0.35–5.50)

## 2024-01-23 LAB — VITAMIN B12: Vitamin B-12: 433 pg/mL (ref 211–911)

## 2024-01-23 NOTE — Assessment & Plan Note (Signed)
 Chronic Lab Results  Component Value Date   HGBA1C 6.1 07/25/2023   Check a1c Low sugar / carb diet Stressed regular exercise

## 2024-01-23 NOTE — Assessment & Plan Note (Addendum)
 Chronic Following with Dr Donelda Fujita for different joints Hydrocodone -aceta  Q 6 hr prn for severe pain will be taking prednisone  prn per sports medicine when she is very active-especially when on vacation

## 2024-01-23 NOTE — Assessment & Plan Note (Signed)
Chronic Following with cardiology On Xarelto 20 mg daily, diltiazem 180 mg twice daily CBC, CMP 

## 2024-01-23 NOTE — Assessment & Plan Note (Signed)
 Chronic DEXA up-to-date doing water aerobics, not able to walk much Taking calcium and vitamin d  Check vitamin D  level

## 2024-01-23 NOTE — Assessment & Plan Note (Signed)
 Chronic Taking vitamin d daily Check vitamin d level

## 2024-01-23 NOTE — Assessment & Plan Note (Signed)
Chronic Regular exercise and healthy diet encouraged Check lipid panel  Continue pravastatin 40 mg daily 

## 2024-01-23 NOTE — Assessment & Plan Note (Signed)
 Chronic Blood pressure well controlled CMP, cbc Continue diltiazem 180 mg twice daily, HCTZ 25 mg daily, lisinopril 40 mg daily

## 2024-01-23 NOTE — Assessment & Plan Note (Signed)
Chronic Check level 

## 2024-01-23 NOTE — Assessment & Plan Note (Signed)
 Chronic She did see neurology MoCA score 23/30 Started on midodrine 5 mg twice daily

## 2024-01-23 NOTE — Assessment & Plan Note (Signed)
Chronic Secondary to Afib On xarelto 

## 2024-01-23 NOTE — Assessment & Plan Note (Addendum)
 Chronic Increased anxiety Started Cymbalta  about 2 months ago Continue cymbalta  30 mg HS

## 2024-01-24 ENCOUNTER — Encounter (HOSPITAL_BASED_OUTPATIENT_CLINIC_OR_DEPARTMENT_OTHER): Payer: Self-pay

## 2024-01-24 ENCOUNTER — Ambulatory Visit: Payer: Self-pay | Admitting: Internal Medicine

## 2024-01-24 ENCOUNTER — Ambulatory Visit (HOSPITAL_BASED_OUTPATIENT_CLINIC_OR_DEPARTMENT_OTHER)

## 2024-01-24 DIAGNOSIS — R29898 Other symptoms and signs involving the musculoskeletal system: Secondary | ICD-10-CM | POA: Diagnosis not present

## 2024-01-24 DIAGNOSIS — M6281 Muscle weakness (generalized): Secondary | ICD-10-CM | POA: Diagnosis not present

## 2024-01-24 DIAGNOSIS — M25552 Pain in left hip: Secondary | ICD-10-CM | POA: Diagnosis not present

## 2024-01-24 DIAGNOSIS — M25551 Pain in right hip: Secondary | ICD-10-CM

## 2024-01-24 DIAGNOSIS — R2689 Other abnormalities of gait and mobility: Secondary | ICD-10-CM

## 2024-01-24 DIAGNOSIS — M25561 Pain in right knee: Secondary | ICD-10-CM | POA: Diagnosis not present

## 2024-01-24 DIAGNOSIS — M25562 Pain in left knee: Secondary | ICD-10-CM | POA: Diagnosis not present

## 2024-01-24 NOTE — Therapy (Signed)
 OUTPATIENT PHYSICAL THERAPY TREATMENT       Patient Name: SHERRIKA WEAKLAND MRN: [id number] DOB:09/11/1945, 78 y.o., female Today's Date: 01/25/2024  END OF SESSION:  PT End of Session - 01/24/24 1314     Visit Number 14    Number of Visits 32    Date for PT Re-Evaluation 03/11/24    Authorization Type MEDICARE    Progress Note Due on Visit 20    PT Start Time 1304    PT Stop Time 1345    PT Time Calculation (min) 41 min    Activity Tolerance Patient tolerated treatment well    Behavior During Therapy WFL for tasks assessed/performed                   Past Medical History:  Diagnosis Date   ARTHRITIS, HIPS, BILATERAL    Atrial fibrillation (HCC)    chronic anticoag - Pradaxa    BURSITIS, SUBTROCHANTERIC    HYPERTENSION    MITRAL VALVE PROLAPSE    Mixed hyperlipidemia    Past Surgical History:  Procedure Laterality Date   DILATION AND CURETTAGE OF UTERUS     TONSILLECTOMY     Patient Active Problem List   Diagnosis Date Noted   Wrist pain, acute, right 11/30/2023   Thyroid  nodule 11/29/2023   Fall 11/29/2023   Anxiety 10/11/2023   Memory impairment 10/03/2023   Intermittent right-sided chest pain 07/25/2023   Pruritus - legs 01/22/2023   Vitamin D  deficiency 01/22/2022   Secondary hypercoagulable state (HCC) 11/17/2020   B12 deficiency 10/28/2019   IBS (irritable bowel syndrome) 10/28/2019   Arthritis of right hand 07/22/2019   Presbycusis of both ears 05/14/2018   Prediabetes 05/15/2017   Pain of right heel 01/09/2017   Primary osteoarthritis of both feet 07/10/2016   Primary osteoarthritis of both hands 07/10/2016   Alopecia totalis 06/05/2016   Dermatochalasis of both upper eyelids 10/12/2015   H/O dysplastic nevus 08/03/2015   Degenerative lumbar disc 12/04/2013   Hematuria, undiagnosed cause 09/02/2013   Osteopenia 06/11/2013   Primary generalized (osteo)arthritis 12/18/2011   Mixed hyperlipidemia 03/25/2009   Essential hypertension  03/24/2009   ATRIAL FIBRILLATION 03/24/2009   Greater trochanteric bursitis of left hip 02/12/2007    PCP: Geofm Glade JINNY, MD  REFERRING PROVIDER: Marquette Ozell BIRCH, DO  REFERRING DIAG: [phone] (ICD-10-CM) - Pain in left hip R26.2 (ICD-10-CM) - Difficulty in walking, not elsewhere classified Bilateral OA of hip, bilateral OA of knee, pain in R hip, pain in L hip  THERAPY DIAG:  Bilateral hip pain  Pain in both knees, unspecified chronicity  Muscle weakness (generalized)  Other symptoms and signs involving the musculoskeletal system  Other abnormalities of gait and mobility  Rationale for Evaluation and Treatment: Rehabilitation  ONSET DATE: chronic   SUBJECTIVE:   SUBJECTIVE STATEMENT:  Still waiting on x ray results. She was not as sore after last PT session.   EVAL: Patient states she just joined the gym a few weeks ago. Then had a problem with her hip. Ruptured disc in back twice. Patient states hip pain for a long time. Shots helped a little in R. R hip is worse than the left. On way to [place]  she had a pain in R glute where she had to go to ER. L hip has some pain as well. Symptoms are worse with prolonged walking. Wants to able to workout again, eager to get stronger. Had weak abductors in the past. Legs are very weak and has trouble  with stairs. L knee bothers her and troubles with squats and lunges.  PERTINENT HISTORY: hypertension, bilateral hip arthritis, atrial fibrillation  PAIN:  Are you having pain? No at rest  PRECAUTIONS: None  WEIGHT BEARING RESTRICTIONS: No  FALLS:  Has patient fallen in last 6 months? No  PLOF: Independent  PATIENT GOALS: get stronger, exercise   OBJECTIVE: (objective measures from initial evaluation unless otherwise dated)  PATIENT SURVEYS:  LEFS 31/80            6/5: 43/80 COGNITION: Overall cognitive status: Within functional limits for tasks assessed     SENSATION: WFL  POSTURE: No Significant postural  limitations  PALPATION: Grossly tender bilateral glutes  LOWER EXTREMITY ROM:  Active ROM Right eval Left eval  Hip flexion    Hip extension    Hip abduction    Hip adduction    Hip internal rotation    Hip external rotation    Knee flexion    Knee extension    Ankle dorsiflexion    Ankle plantarflexion    Ankle inversion    Ankle eversion     (Blank rows = not tested) *= pain/symptoms  LOWER EXTREMITY MMT:  MMT Right eval Left eval Right 6/5 Left 6/5  Hip flexion 4 4- 4 4-  Hip extension 3 3 3+ 4-  Hip abduction 3- 3 3+ 4+  Hip adduction      Hip internal rotation      Hip external rotation      Knee flexion 4+ 4+ 5 5  Knee extension 4+ 4+ 4+ 5  Ankle dorsiflexion      Ankle plantarflexion      Ankle inversion      Ankle eversion       (Blank rows = not tested) *= pain/symptoms    FUNCTIONAL TESTS:  5 times sit to stand: 31.35 seconds with hands on thighs, labored, relies L>RLE, R valgus Stairs: alternating pattern, 7 inch steps, unilateral HHA, L knee pain, decreased concentric and eccentric strength and motor control bilaterally, increased valgus on R  6/5: 5xSTS: 11.4seconds Stairs: single HHA, improved control though mild offloading noted of R LE   GAIT: Distance walked: 100 feet Assistive device utilized: None Level of assistance: Complete Independence Comments: R truncal lean on R stance, decreased hip extension bilaterally   TODAY'S TREATMENT:                                                                                                                              DATE:   01/24/24 Nustep 5 minutes LE L4 only for dynamic warm up  Heel toe rocks on airex  March on airex 2 x 10 Runner step up on airex x10ea Step up over airex 2 x 10 bilateral Hip hikes 4 step 2x10ea (cues for proper performance  Semi tandem on airex with cross body reaches 2x10ea Standing hip abd/ext with RTB at ankles 2x10ea     01/22/24  Nustep 5 minutes LE L4 only  for dynamic warm up  March on airex 2 x 10 Step up over airex 2 x 10 bilateral SLS with 3 way reach on airex 5 x 5 second holds Hip hikes 4 step 2x10ea (cues for proper performance  Semi tandem on airex with cross body reaches x10ea Lateral step up 6 inch 2 x 10 RLE only- L knee pain Standing hip abd/ext with RTB at ankles 2x10ea Sidleying hip abduction 2x10ea Bridges 2x10  01/17/24 Nustep 5 minutes LE L4 only for dynamic warm up  March on airex 2 x 10 Step up over airex 2 x 10 bilateral SLS with 3 way reach on airex 5 x 5 second holds Hip hikes 4 step 2x10ea (cues for proper performance  Semi tandem on airex with corss body reaches Lateral step up 6 inch 2 x 10 RLE only- L knee pain Standing hip abd/ext with RTB at ankles 2x10ea Sidleying hip abduction 2x10ea Bridges 2x10    01/15/24 Nustep 5 minutes LE L4 only for dynamic warm up  March on airex 2 x 10 Step up over airex 2 x 10 bilateral SLS with 3 way reach on airex 5 x 5 second holds Lateral step up 6 inch 2 x 10  Lateral lunge 1 x 10 RLE , L attempted but poor mechanics and crepitus and pain   01/10/24 Nustep 4 minutes LE L4 only for dynamic warm up (had increased pain with this today) Updated MMT LEFS 5xSTS Reviewed goals   Marching on airex (slow) NBOS on ariex with EC Semi tandem on airex with corss body reaches   01/08/24 Nustep 5 minutes LE L4 only for dynamic warm up Step up 6 inch 2 x 10  Lateral step up 6 inch 2 x 10  Standing hip abduction/extension RTB at ankles 2 x 10ea Sit to stands 2x10  Marching on airex (slow) NBOS airex with head turns Semi tandem on airex 2 x 30 second holds ea [facility] taps for balance-3cones x5ea bil    PATIENT EDUCATION:  Education details: Patient educated on exam findings, POC, scope of PT, HEP, relevant anatomy and pathology. 12/14/23: HEP Person educated: Patient Education method: Explanation, Demonstration, and Handouts Education comprehension: verbalized  understanding, returned demonstration, verbal cues required, and tactile cues required  HOME EXERCISE PROGRAM: Access Code: [email or web address] URL: [email or web address] Date: 11/22/2023 Prepared by: Prentice Zaunegger  Exercises - Supine Bridge  - 1 x daily - 7 x weekly - 2 sets - 10 reps - Clamshell  - 1 x daily - 7 x weekly - 2 sets - 10 reps  ASSESSMENT:  CLINICAL IMPRESSION: Able to progress with TherEX and stability training today.  She was challenged by a runner step up on Airex pad due to lack of single-leg stability.  Observed more weight placed through hindfoot versus forefoot.  Improving with stability with heel toe raises and marches on Airex pad.  Patient relies less on upper extremity support using railing than previous sessions.  Patient is progressing well.  Awaiting x-ray results for her left knee.  She is limited with step up and squatting movements due to left knee discomfort.    OBJECTIVE IMPAIRMENTS: Abnormal gait, decreased activity tolerance, decreased balance, decreased endurance, decreased mobility, difficulty walking, decreased ROM, decreased strength, increased muscle spasms, impaired flexibility, improper body mechanics, and pain.   ACTIVITY LIMITATIONS: carrying, lifting, bending, standing, squatting, stairs, transfers, locomotion level, and caring for others  PARTICIPATION LIMITATIONS: meal prep, cleaning, laundry, shopping, community activity,  and yard work  PERSONAL FACTORS: Time since onset of injury/illness/exacerbation and 3+ comorbidities: HTN, afib, hx hip and knee pain, hx back pain are also affecting patient's functional outcome.   REHAB POTENTIAL: Good  CLINICAL DECISION MAKING: Evolving/moderate complexity  EVALUATION COMPLEXITY: Moderate   GOALS: Goals reviewed with patient? Yes  SHORT TERM GOALS: Target date: 12/20/2023    Patient will be independent with HEP in order to improve functional outcomes. Baseline: Goal status: MET  6/3  2.  Patient will report at least 25% improvement in symptoms for improved quality of life. Baseline: Goal status: MET 6/3    LONG TERM GOALS: Target date: 01/17/2024    Patient will report at least 75% improvement in symptoms for improved quality of life. Baseline:  Goal status: IN PROGRESS (50% 6/5)  2.  Patient will improve LEFS score by at least 9 points in order to indicate improved tolerance to activity. Baseline: 31/80 Goal status: MET 6/5 (43/80)  3.  Patient will be able to navigate stairs with reciprocal pattern without compensation in order to demonstrate improved LE strength. Baseline:  Goal status: IN PROGRESS (only mild compensation 6/5)  4.  Patient will demonstrate grade of 5/5 MMT grade in all tested musculature as evidence of improved strength to assist with stair ambulation and gait.  Baseline:  Goal status: IN PROGRESS 6/5  5.  Patient will be able to complete 5x STS in under 15 seconds in order to demo improved functional strength. Baseline:  Goal status: MET 6/5  6.  Patient will be able to perform full gym routine independently.  Baseline:  Goal status: INITIAL (has not yet returned to gym. 6/5)   PLAN:  PT FREQUENCY: 2x/week  PT DURATION: 8 weeks  PLANNED INTERVENTIONS: 97164- PT Re-evaluation, 97110-Therapeutic exercises, 97530- Therapeutic activity, 97112- Neuromuscular re-education, 97535- Self Care, [id number]- Manual therapy, [phone]- Gait training, [phone]- Orthotic Fit/training, [phone]- Canalith repositioning, [id number]- Aquatic Therapy, [phone]- Splinting, Patient/Family education, Balance training, Stair training, Taping, Dry Needling, Joint mobilization, Joint manipulation, Spinal manipulation, Spinal mobilization, Scar mobilization, and DME instructions.  PLAN FOR NEXT SESSION: f/u with HEP, glute and quad strength, functional strength, progress to gym routine as able    Asberry FORBES Rodes, PTA 01/25/2024, 12:07 PM

## 2024-01-25 ENCOUNTER — Other Ambulatory Visit: Payer: Self-pay | Admitting: Internal Medicine

## 2024-01-29 ENCOUNTER — Encounter (HOSPITAL_BASED_OUTPATIENT_CLINIC_OR_DEPARTMENT_OTHER): Payer: Self-pay

## 2024-01-29 ENCOUNTER — Ambulatory Visit (HOSPITAL_BASED_OUTPATIENT_CLINIC_OR_DEPARTMENT_OTHER)

## 2024-01-29 DIAGNOSIS — M25561 Pain in right knee: Secondary | ICD-10-CM | POA: Diagnosis not present

## 2024-01-29 DIAGNOSIS — M6281 Muscle weakness (generalized): Secondary | ICD-10-CM

## 2024-01-29 DIAGNOSIS — R29898 Other symptoms and signs involving the musculoskeletal system: Secondary | ICD-10-CM

## 2024-01-29 DIAGNOSIS — M25552 Pain in left hip: Secondary | ICD-10-CM

## 2024-01-29 DIAGNOSIS — M25562 Pain in left knee: Secondary | ICD-10-CM

## 2024-01-29 DIAGNOSIS — M25551 Pain in right hip: Secondary | ICD-10-CM | POA: Diagnosis not present

## 2024-01-29 DIAGNOSIS — R2689 Other abnormalities of gait and mobility: Secondary | ICD-10-CM

## 2024-01-29 NOTE — Therapy (Signed)
 OUTPATIENT PHYSICAL THERAPY TREATMENT       Patient Name: [name] MRN: [id number] DOB:Feb 22, 1946, 78 y.o., female Today's Date: 01/29/2024  END OF SESSION:  PT End of Session - 01/29/24 1304     Visit Number 15    Number of Visits 32    Date for PT Re-Evaluation 03/11/24    Authorization Type MEDICARE    Progress Note Due on Visit 20    PT Start Time 1303    PT Stop Time 1345    PT Time Calculation (min) 42 min    Activity Tolerance Patient tolerated treatment well    Behavior During Therapy WFL for tasks assessed/performed                    Past Medical History:  Diagnosis Date   ARTHRITIS, HIPS, BILATERAL    Atrial fibrillation (HCC)    chronic anticoag - Pradaxa    BURSITIS, SUBTROCHANTERIC    HYPERTENSION    MITRAL VALVE PROLAPSE    Mixed hyperlipidemia    Past Surgical History:  Procedure Laterality Date   DILATION AND CURETTAGE OF UTERUS     TONSILLECTOMY     Patient Active Problem List   Diagnosis Date Noted   Wrist pain, acute, right 11/30/2023   Thyroid  nodule 11/29/2023   Fall 11/29/2023   Anxiety 10/11/2023   Memory impairment 10/03/2023   Intermittent right-sided chest pain 07/25/2023   Pruritus - legs 01/22/2023   Vitamin D  deficiency 01/22/2022   Secondary hypercoagulable state (HCC) 11/17/2020   B12 deficiency 10/28/2019   IBS (irritable bowel syndrome) 10/28/2019   Arthritis of right hand 07/22/2019   Presbycusis of both ears 05/14/2018   Prediabetes 05/15/2017   Pain of right heel 01/09/2017   Primary osteoarthritis of both feet 07/10/2016   Primary osteoarthritis of both hands 07/10/2016   Alopecia totalis 06/05/2016   Dermatochalasis of both upper eyelids 10/12/2015   H/O dysplastic nevus 08/03/2015   Degenerative lumbar disc 12/04/2013   Hematuria, undiagnosed cause 09/02/2013   Osteopenia 06/11/2013   Primary generalized (osteo)arthritis 12/18/2011   Mixed hyperlipidemia 03/25/2009   Essential hypertension  03/24/2009   ATRIAL FIBRILLATION 03/24/2009   Greater trochanteric bursitis of left hip 02/12/2007    PCP: Geofm Glade JINNY, MD  REFERRING PROVIDER: Marquette Ozell BIRCH, DO  REFERRING DIAG: [phone] (ICD-10-CM) - Pain in left hip R26.2 (ICD-10-CM) - Difficulty in walking, not elsewhere classified Bilateral OA of hip, bilateral OA of knee, pain in R hip, pain in L hip  THERAPY DIAG:  Bilateral hip pain  Pain in both knees, unspecified chronicity  Muscle weakness (generalized)  Other symptoms and signs involving the musculoskeletal system  Other abnormalities of gait and mobility  Rationale for Evaluation and Treatment: Rehabilitation  ONSET DATE: chronic   SUBJECTIVE:   SUBJECTIVE STATEMENT:  Pt reports she can tell she has made a lot of progress. Was on feet a lot helping son move which maybe caused some soreness.  EVAL: Patient states she just joined the gym a few weeks ago. Then had a problem with her hip. Ruptured disc in back twice. Patient states hip pain for a long time. Shots helped a little in R. R hip is worse than the left. On way to [place]  she had a pain in R glute where she had to go to ER. L hip has some pain as well. Symptoms are worse with prolonged walking. Wants to able to workout again, eager to get stronger. Had weak abductors  in the past. Legs are very weak and has trouble with stairs. L knee bothers her and troubles with squats and lunges.  PERTINENT HISTORY: hypertension, bilateral hip arthritis, atrial fibrillation  PAIN:  Are you having pain? No at rest  PRECAUTIONS: None  WEIGHT BEARING RESTRICTIONS: No  FALLS:  Has patient fallen in last 6 months? No  PLOF: Independent  PATIENT GOALS: get stronger, exercise   OBJECTIVE: (objective measures from initial evaluation unless otherwise dated)  PATIENT SURVEYS:  LEFS 31/80            6/5: 43/80 COGNITION: Overall cognitive status: Within functional limits for tasks  assessed     SENSATION: WFL  POSTURE: No Significant postural limitations  PALPATION: Grossly tender bilateral glutes  LOWER EXTREMITY ROM:  Active ROM Right eval Left eval  Hip flexion    Hip extension    Hip abduction    Hip adduction    Hip internal rotation    Hip external rotation    Knee flexion    Knee extension    Ankle dorsiflexion    Ankle plantarflexion    Ankle inversion    Ankle eversion     (Blank rows = not tested) *= pain/symptoms  LOWER EXTREMITY MMT:  MMT Right eval Left eval Right 6/5 Left 6/5  Hip flexion 4 4- 4 4-  Hip extension 3 3 3+ 4-  Hip abduction 3- 3 3+ 4+  Hip adduction      Hip internal rotation      Hip external rotation      Knee flexion 4+ 4+ 5 5  Knee extension 4+ 4+ 4+ 5  Ankle dorsiflexion      Ankle plantarflexion      Ankle inversion      Ankle eversion       (Blank rows = not tested) *= pain/symptoms    FUNCTIONAL TESTS:  5 times sit to stand: 31.35 seconds with hands on thighs, labored, relies L>RLE, R valgus Stairs: alternating pattern, 7 inch steps, unilateral HHA, L knee pain, decreased concentric and eccentric strength and motor control bilaterally, increased valgus on R  6/5: 5xSTS: 11.4seconds Stairs: single HHA, improved control though mild offloading noted of R LE   GAIT: Distance walked: 100 feet Assistive device utilized: None Level of assistance: Complete Independence Comments: R truncal lean on R stance, decreased hip extension bilaterally   TODAY'S TREATMENT:                                                                                                                              DATE:   01/29/24 Nustep 5 minutes LE L5 only for dynamic warm up  Supine SLR 2x10ea S/l abduction 3x10ea Bridges 3x10 LTR x8 STM to L gluteal mm LAQ 3# (trialled on L LE but painful, so stopped) x20 R LE  Step up over airex 2 x 10 bilateral Lateral step up/over airex 2x10 Side stepping along rail with RTB  at  ankles STS 2x10  01/24/24 Nustep 5 minutes LE L4 only for dynamic warm up  Heel toe rocks on airex  March on airex 2 x 10 Runner step up on airex x10ea Step up over airex 2 x 10 bilateral Hip hikes 4 step 2x10ea (cues for proper performance  Semi tandem on airex with cross body reaches 2x10ea Standing hip abd/ext with RTB at ankles 2x10ea     01/22/24 Nustep 5 minutes LE L4 only for dynamic warm up  March on airex 2 x 10 Step up over airex 2 x 10 bilateral SLS with 3 way reach on airex 5 x 5 second holds Hip hikes 4 step 2x10ea (cues for proper performance  Semi tandem on airex with cross body reaches x10ea Lateral step up 6 inch 2 x 10 RLE only- L knee pain Standing hip abd/ext with RTB at ankles 2x10ea Sidleying hip abduction 2x10ea Bridges 2x10  01/17/24 Nustep 5 minutes LE L4 only for dynamic warm up  March on airex 2 x 10 Step up over airex 2 x 10 bilateral SLS with 3 way reach on airex 5 x 5 second holds Hip hikes 4 step 2x10ea (cues for proper performance  Semi tandem on airex with corss body reaches Lateral step up 6 inch 2 x 10 RLE only- L knee pain Standing hip abd/ext with RTB at ankles 2x10ea Sidleying hip abduction 2x10ea Bridges 2x10    01/15/24 Nustep 5 minutes LE L4 only for dynamic warm up  March on airex 2 x 10 Step up over airex 2 x 10 bilateral SLS with 3 way reach on airex 5 x 5 second holds Lateral step up 6 inch 2 x 10  Lateral lunge 1 x 10 RLE , L attempted but poor mechanics and crepitus and pain   01/10/24 Nustep 4 minutes LE L4 only for dynamic warm up (had increased pain with this today) Updated MMT LEFS 5xSTS Reviewed goals   Marching on airex (slow) NBOS on ariex with EC Semi tandem on airex with corss body reaches   01/08/24 Nustep 5 minutes LE L4 only for dynamic warm up Step up 6 inch 2 x 10  Lateral step up 6 inch 2 x 10  Standing hip abduction/extension RTB at ankles 2 x 10ea Sit to stands 2x10  Marching on airex  (slow) NBOS airex with head turns Semi tandem on airex 2 x 30 second holds ea [facility] taps for balance-3cones x5ea bil    PATIENT EDUCATION:  Education details: Patient educated on exam findings, POC, scope of PT, HEP, relevant anatomy and pathology. 12/14/23: HEP Person educated: Patient Education method: Explanation, Demonstration, and Handouts Education comprehension: verbalized understanding, returned demonstration, verbal cues required, and tactile cues required  HOME EXERCISE PROGRAM: Access Code: [email or web address] URL: [email or web address] Date: 11/22/2023 Prepared by: Prentice Zaunegger  Exercises - Supine Bridge  - 1 x daily - 7 x weekly - 2 sets - 10 reps - Clamshell  - 1 x daily - 7 x weekly - 2 sets - 10 reps  ASSESSMENT:  CLINICAL IMPRESSION: Worked on progressing hip and knee strengthening today. Pt did report increase in R hip discomfort following s/l hip abduction. Very tender over greater trochanter when palpated. Pt initially had no pain in L knee with unresisted LAQ, but this increased when weight was added, so did not continue. Able to tolerate standing exercises without complaint of pain, though did have 4/10 pan with sit to stands from plinth.  Pt has L knee  xray results and is waiting for appt with MD to review them with her.    OBJECTIVE IMPAIRMENTS: Abnormal gait, decreased activity tolerance, decreased balance, decreased endurance, decreased mobility, difficulty walking, decreased ROM, decreased strength, increased muscle spasms, impaired flexibility, improper body mechanics, and pain.   ACTIVITY LIMITATIONS: carrying, lifting, bending, standing, squatting, stairs, transfers, locomotion level, and caring for others  PARTICIPATION LIMITATIONS: meal prep, cleaning, laundry, shopping, community activity, and yard work  PERSONAL FACTORS: Time since onset of injury/illness/exacerbation and 3+ comorbidities: HTN, afib, hx hip and knee pain, hx back pain are also  affecting patient's functional outcome.   REHAB POTENTIAL: Good  CLINICAL DECISION MAKING: Evolving/moderate complexity  EVALUATION COMPLEXITY: Moderate   GOALS: Goals reviewed with patient? Yes  SHORT TERM GOALS: Target date: 12/20/2023    Patient will be independent with HEP in order to improve functional outcomes. Baseline: Goal status: MET 6/3  2.  Patient will report at least 25% improvement in symptoms for improved quality of life. Baseline: Goal status: MET 6/3    LONG TERM GOALS: Target date: 01/17/2024    Patient will report at least 75% improvement in symptoms for improved quality of life. Baseline:  Goal status: IN PROGRESS (50% 6/5)  2.  Patient will improve LEFS score by at least 9 points in order to indicate improved tolerance to activity. Baseline: 31/80 Goal status: MET 6/5 (43/80)  3.  Patient will be able to navigate stairs with reciprocal pattern without compensation in order to demonstrate improved LE strength. Baseline:  Goal status: IN PROGRESS (only mild compensation 6/5)  4.  Patient will demonstrate grade of 5/5 MMT grade in all tested musculature as evidence of improved strength to assist with stair ambulation and gait.  Baseline:  Goal status: IN PROGRESS 6/5  5.  Patient will be able to complete 5x STS in under 15 seconds in order to demo improved functional strength. Baseline:  Goal status: MET 6/5  6.  Patient will be able to perform full gym routine independently.  Baseline:  Goal status: INITIAL (has not yet returned to gym. 6/5)   PLAN:  PT FREQUENCY: 2x/week  PT DURATION: 8 weeks  PLANNED INTERVENTIONS: 97164- PT Re-evaluation, 97110-Therapeutic exercises, 97530- Therapeutic activity, 97112- Neuromuscular re-education, 97535- Self Care, 02859- Manual therapy, [phone]- Gait training, [phone]- Orthotic Fit/training, [phone]- Canalith repositioning, [id number]- Aquatic Therapy, [phone]- Splinting, Patient/Family education, Balance training,  Stair training, Taping, Dry Needling, Joint mobilization, Joint manipulation, Spinal manipulation, Spinal mobilization, Scar mobilization, and DME instructions.  PLAN FOR NEXT SESSION: f/u with HEP, glute and quad strength, functional strength, progress to gym routine as able    Asberry FORBES Rodes, PTA 01/29/2024, 2:07 PM

## 2024-01-31 ENCOUNTER — Ambulatory Visit (HOSPITAL_BASED_OUTPATIENT_CLINIC_OR_DEPARTMENT_OTHER): Admitting: Physical Therapy

## 2024-01-31 ENCOUNTER — Encounter (HOSPITAL_BASED_OUTPATIENT_CLINIC_OR_DEPARTMENT_OTHER): Payer: Self-pay | Admitting: Physical Therapy

## 2024-01-31 DIAGNOSIS — M25561 Pain in right knee: Secondary | ICD-10-CM

## 2024-01-31 DIAGNOSIS — R29898 Other symptoms and signs involving the musculoskeletal system: Secondary | ICD-10-CM

## 2024-01-31 DIAGNOSIS — M25552 Pain in left hip: Secondary | ICD-10-CM | POA: Diagnosis not present

## 2024-01-31 DIAGNOSIS — M25562 Pain in left knee: Secondary | ICD-10-CM | POA: Diagnosis not present

## 2024-01-31 DIAGNOSIS — M25551 Pain in right hip: Secondary | ICD-10-CM | POA: Diagnosis not present

## 2024-01-31 DIAGNOSIS — M6281 Muscle weakness (generalized): Secondary | ICD-10-CM

## 2024-01-31 DIAGNOSIS — R2689 Other abnormalities of gait and mobility: Secondary | ICD-10-CM

## 2024-01-31 NOTE — Therapy (Signed)
 OUTPATIENT PHYSICAL THERAPY TREATMENT       Patient Name: [name] MRN: [id number] DOB:[birth year], 78 y.o., female Today's Date: 01/31/2024  END OF SESSION:  PT End of Session - 01/31/24 1303     Visit Number 16    Number of Visits 32    Date for PT Re-Evaluation 03/11/24    Authorization Type MEDICARE    Progress Note Due on Visit 20    PT Start Time 1303    PT Stop Time 1345    PT Time Calculation (min) 42 min    Activity Tolerance Patient tolerated treatment well    Behavior During Therapy WFL for tasks assessed/performed                    Past Medical History:  Diagnosis Date   ARTHRITIS, HIPS, BILATERAL    Atrial fibrillation (HCC)    chronic anticoag - Pradaxa    BURSITIS, SUBTROCHANTERIC    HYPERTENSION    MITRAL VALVE PROLAPSE    Mixed hyperlipidemia    Past Surgical History:  Procedure Laterality Date   DILATION AND CURETTAGE OF UTERUS     TONSILLECTOMY     Patient Active Problem List   Diagnosis Date Noted   Wrist pain, acute, right 11/30/2023   Thyroid  nodule 11/29/2023   Fall 11/29/2023   Anxiety 10/11/2023   Memory impairment 10/03/2023   Intermittent right-sided chest pain 07/25/2023   Pruritus - legs 01/22/2023   Vitamin D  deficiency 01/22/2022   Secondary hypercoagulable state (HCC) 11/17/2020   B12 deficiency 10/28/2019   IBS (irritable bowel syndrome) 10/28/2019   Arthritis of right hand 07/22/2019   Presbycusis of both ears 05/14/2018   Prediabetes 05/15/2017   Pain of right heel 01/09/2017   Primary osteoarthritis of both feet 07/10/2016   Primary osteoarthritis of both hands 07/10/2016   Alopecia totalis 06/05/2016   Dermatochalasis of both upper eyelids 10/12/2015   H/O dysplastic nevus 08/03/2015   Degenerative lumbar disc 12/04/2013   Hematuria, undiagnosed cause 09/02/2013   Osteopenia 06/11/2013   Primary generalized (osteo)arthritis 12/18/2011   Mixed hyperlipidemia 03/25/2009   Essential hypertension  03/24/2009   ATRIAL FIBRILLATION 03/24/2009   Greater trochanteric bursitis of left hip 02/12/2007    PCP: Geofm Glade JINNY, MD  REFERRING PROVIDER: Marquette Ozell BIRCH, DO  REFERRING DIAG: [phone] (ICD-10-CM) - Pain in left hip R26.2 (ICD-10-CM) - Difficulty in walking, not elsewhere classified Bilateral OA of hip, bilateral OA of knee, pain in R hip, pain in L hip  THERAPY DIAG:  Bilateral hip pain  Pain in both knees, unspecified chronicity  Muscle weakness (generalized)  Other symptoms and signs involving the musculoskeletal system  Other abnormalities of gait and mobility  Rationale for Evaluation and Treatment: Rehabilitation  ONSET DATE: chronic   SUBJECTIVE:   SUBJECTIVE STATEMENT:  Pt reports feeling alright. Feels like she is getting stronger and can do more before fatigue. Feels like her balance is getting better.   EVAL: Patient states she just joined the gym a few weeks ago. Then had a problem with her hip. Ruptured disc in back twice. Patient states hip pain for a long time. Shots helped a little in R. R hip is worse than the left. On way to [place]  she had a pain in R glute where she had to go to ER. L hip has some pain as well. Symptoms are worse with prolonged walking. Wants to able to workout again, eager to get stronger. Had weak abductors in  the past. Legs are very weak and has trouble with stairs. L knee bothers her and troubles with squats and lunges.  PERTINENT HISTORY: hypertension, bilateral hip arthritis, atrial fibrillation  PAIN:  Are you having pain? No at rest  PRECAUTIONS: None  WEIGHT BEARING RESTRICTIONS: No  FALLS:  Has patient fallen in last 6 months? No  PLOF: Independent  PATIENT GOALS: get stronger, exercise   OBJECTIVE: (objective measures from initial evaluation unless otherwise dated)  PATIENT SURVEYS:  LEFS 31/80            6/5: 43/80 COGNITION: Overall cognitive status: Within functional limits for tasks  assessed     SENSATION: WFL  POSTURE: No Significant postural limitations  PALPATION: Grossly tender bilateral glutes  LOWER EXTREMITY ROM:  Active ROM Right eval Left eval  Hip flexion    Hip extension    Hip abduction    Hip adduction    Hip internal rotation    Hip external rotation    Knee flexion    Knee extension    Ankle dorsiflexion    Ankle plantarflexion    Ankle inversion    Ankle eversion     (Blank rows = not tested) *= pain/symptoms  LOWER EXTREMITY MMT:  MMT Right eval Left eval Right 6/5 Left 6/5  Hip flexion 4 4- 4 4-  Hip extension 3 3 3+ 4-  Hip abduction 3- 3 3+ 4+  Hip adduction      Hip internal rotation      Hip external rotation      Knee flexion 4+ 4+ 5 5  Knee extension 4+ 4+ 4+ 5  Ankle dorsiflexion      Ankle plantarflexion      Ankle inversion      Ankle eversion       (Blank rows = not tested) *= pain/symptoms    FUNCTIONAL TESTS:  5 times sit to stand: 31.35 seconds with hands on thighs, labored, relies L>RLE, R valgus Stairs: alternating pattern, 7 inch steps, unilateral HHA, L knee pain, decreased concentric and eccentric strength and motor control bilaterally, increased valgus on R  6/5: 5xSTS: 11.4seconds Stairs: single HHA, improved control though mild offloading noted of R LE   GAIT: Distance walked: 100 feet Assistive device utilized: None Level of assistance: Complete Independence Comments: R truncal lean on R stance, decreased hip extension bilaterally   TODAY'S TREATMENT:                                                                                                                              DATE:  01/31/24 Nustep 5 minutes LE L5 only for dynamic warm up  Standing alternating march on airex 2 x 10 Step up over airex 2 x 10 bilateral Lateral step up/over airex 2x10 SLS with 3 way reach on airex 5 x 5 second holds STS 1 x 10 Hip hike 2 x 10  Review of HEP  01/29/24 Nustep 5  minutes LE L5 only for  dynamic warm up  Supine SLR 2x10ea S/l abduction 3x10ea Bridges 3x10 LTR x8 STM to L gluteal mm LAQ 3# (trialled on L LE but painful, so stopped) x20 R LE  Step up over airex 2 x 10 bilateral Lateral step up/over airex 2x10 Side stepping along rail with RTB at ankles STS 2x10  01/24/24 Nustep 5 minutes LE L4 only for dynamic warm up  Heel toe rocks on airex  March on airex 2 x 10 Runner step up on airex x10ea Step up over airex 2 x 10 bilateral Hip hikes 4 step 2x10ea (cues for proper performance  Semi tandem on airex with cross body reaches 2x10ea Standing hip abd/ext with RTB at ankles 2x10ea     01/22/24 Nustep 5 minutes LE L4 only for dynamic warm up  March on airex 2 x 10 Step up over airex 2 x 10 bilateral SLS with 3 way reach on airex 5 x 5 second holds Hip hikes 4 step 2x10ea (cues for proper performance  Semi tandem on airex with cross body reaches x10ea Lateral step up 6 inch 2 x 10 RLE only- L knee pain Standing hip abd/ext with RTB at ankles 2x10ea Sidleying hip abduction 2x10ea Bridges 2x10  01/17/24 Nustep 5 minutes LE L4 only for dynamic warm up  March on airex 2 x 10 Step up over airex 2 x 10 bilateral SLS with 3 way reach on airex 5 x 5 second holds Hip hikes 4 step 2x10ea (cues for proper performance  Semi tandem on airex with corss body reaches Lateral step up 6 inch 2 x 10 RLE only- L knee pain Standing hip abd/ext with RTB at ankles 2x10ea Sidleying hip abduction 2x10ea Bridges 2x10    01/15/24 Nustep 5 minutes LE L4 only for dynamic warm up  March on airex 2 x 10 Step up over airex 2 x 10 bilateral SLS with 3 way reach on airex 5 x 5 second holds Lateral step up 6 inch 2 x 10  Lateral lunge 1 x 10 RLE , L attempted but poor mechanics and crepitus and pain   01/10/24 Nustep 4 minutes LE L4 only for dynamic warm up (had increased pain with this today) Updated MMT LEFS 5xSTS Reviewed goals   Marching on airex (slow) NBOS on ariex  with EC Semi tandem on airex with corss body reaches   01/08/24 Nustep 5 minutes LE L4 only for dynamic warm up Step up 6 inch 2 x 10  Lateral step up 6 inch 2 x 10  Standing hip abduction/extension RTB at ankles 2 x 10ea Sit to stands 2x10  Marching on airex (slow) NBOS airex with head turns Semi tandem on airex 2 x 30 second holds ea [facility] taps for balance-3cones x5ea bil    PATIENT EDUCATION:  Education details: Patient educated on exam findings, POC, scope of PT, HEP, relevant anatomy and pathology. 12/14/23: HEP Person educated: Patient Education method: Explanation, Demonstration, and Handouts Education comprehension: verbalized understanding, returned demonstration, verbal cues required, and tactile cues required  HOME EXERCISE PROGRAM: Access Code: [email or web address] URL: [email or web address] Date: 11/22/2023 Prepared by: Prentice Cirilo Canner  Exercises - Supine Bridge  - 1 x daily - 7 x weekly - 2 sets - 10 reps - Clamshell  - 1 x daily - 7 x weekly - 2 sets - 10 reps  ASSESSMENT:  CLINICAL IMPRESSION: Continued with glute strengthening and LE stability/balance exercises. Glute weakness evident with balance exercises and valgus with  STS.  Hip stability improves some with cueing. Review  of HEP and updated for patient. Patient will continue to benefit from physical therapy in order to improve function and reduce impairment.     OBJECTIVE IMPAIRMENTS: Abnormal gait, decreased activity tolerance, decreased balance, decreased endurance, decreased mobility, difficulty walking, decreased ROM, decreased strength, increased muscle spasms, impaired flexibility, improper body mechanics, and pain.   ACTIVITY LIMITATIONS: carrying, lifting, bending, standing, squatting, stairs, transfers, locomotion level, and caring for others  PARTICIPATION LIMITATIONS: meal prep, cleaning, laundry, shopping, community activity, and yard work  PERSONAL FACTORS: Time since onset of  injury/illness/exacerbation and 3+ comorbidities: HTN, afib, hx hip and knee pain, hx back pain are also affecting patient's functional outcome.   REHAB POTENTIAL: Good  CLINICAL DECISION MAKING: Evolving/moderate complexity  EVALUATION COMPLEXITY: Moderate   GOALS: Goals reviewed with patient? Yes  SHORT TERM GOALS: Target date: 12/20/2023    Patient will be independent with HEP in order to improve functional outcomes. Baseline: Goal status: MET 6/3  2.  Patient will report at least 25% improvement in symptoms for improved quality of life. Baseline: Goal status: MET 6/3    LONG TERM GOALS: Target date: 01/17/2024    Patient will report at least 75% improvement in symptoms for improved quality of life. Baseline:  Goal status: IN PROGRESS (50% 6/5)  2.  Patient will improve LEFS score by at least 9 points in order to indicate improved tolerance to activity. Baseline: 31/80 Goal status: MET 6/5 (43/80)  3.  Patient will be able to navigate stairs with reciprocal pattern without compensation in order to demonstrate improved LE strength. Baseline:  Goal status: IN PROGRESS (only mild compensation 6/5)  4.  Patient will demonstrate grade of 5/5 MMT grade in all tested musculature as evidence of improved strength to assist with stair ambulation and gait.  Baseline:  Goal status: IN PROGRESS 6/5  5.  Patient will be able to complete 5x STS in under 15 seconds in order to demo improved functional strength. Baseline:  Goal status: MET 6/5  6.  Patient will be able to perform full gym routine independently.  Baseline:  Goal status: INITIAL (has not yet returned to gym. 6/5)   PLAN:  PT FREQUENCY: 2x/week  PT DURATION: 8 weeks  PLANNED INTERVENTIONS: 97164- PT Re-evaluation, 97110-Therapeutic exercises, 97530- Therapeutic activity, 97112- Neuromuscular re-education, 97535- Self Care, 02859- Manual therapy, [phone]- Gait training, [phone]- Orthotic Fit/training, [phone]-  Canalith repositioning, [id number]- Aquatic Therapy, [phone]- Splinting, Patient/Family education, Balance training, Stair training, Taping, Dry Needling, Joint mobilization, Joint manipulation, Spinal manipulation, Spinal mobilization, Scar mobilization, and DME instructions.  PLAN FOR NEXT SESSION: f/u with HEP, glute and quad strength, functional strength, progress to gym routine as able    Prentice GORMAN Stains, PT 01/31/2024, 1:05 PM

## 2024-02-05 ENCOUNTER — Ambulatory Visit (HOSPITAL_BASED_OUTPATIENT_CLINIC_OR_DEPARTMENT_OTHER): Payer: Self-pay | Attending: Sports Medicine | Admitting: Physical Therapy

## 2024-02-05 ENCOUNTER — Encounter (HOSPITAL_BASED_OUTPATIENT_CLINIC_OR_DEPARTMENT_OTHER): Payer: Self-pay | Admitting: Physical Therapy

## 2024-02-05 DIAGNOSIS — R2689 Other abnormalities of gait and mobility: Secondary | ICD-10-CM | POA: Diagnosis not present

## 2024-02-05 DIAGNOSIS — R29898 Other symptoms and signs involving the musculoskeletal system: Secondary | ICD-10-CM | POA: Insufficient documentation

## 2024-02-05 DIAGNOSIS — M25562 Pain in left knee: Secondary | ICD-10-CM | POA: Diagnosis not present

## 2024-02-05 DIAGNOSIS — M25552 Pain in left hip: Secondary | ICD-10-CM | POA: Diagnosis not present

## 2024-02-05 DIAGNOSIS — M6281 Muscle weakness (generalized): Secondary | ICD-10-CM | POA: Diagnosis not present

## 2024-02-05 DIAGNOSIS — M25551 Pain in right hip: Secondary | ICD-10-CM | POA: Diagnosis not present

## 2024-02-05 DIAGNOSIS — M25561 Pain in right knee: Secondary | ICD-10-CM | POA: Insufficient documentation

## 2024-02-05 NOTE — Therapy (Signed)
 OUTPATIENT PHYSICAL THERAPY TREATMENT       Patient Name: [name] MRN: [id number] DOB:15-Mar-1946, 78 y.o., female Today's Date: 02/05/2024  END OF SESSION:  PT End of Session - 02/05/24 0929     Visit Number 17    Number of Visits 32    Date for PT Re-Evaluation 03/11/24    Authorization Type MEDICARE    Progress Note Due on Visit 20    PT Start Time 0929    PT Stop Time 1010    PT Time Calculation (min) 41 min    Activity Tolerance Patient tolerated treatment well    Behavior During Therapy WFL for tasks assessed/performed                    Past Medical History:  Diagnosis Date   ARTHRITIS, HIPS, BILATERAL    Atrial fibrillation (HCC)    chronic anticoag - Pradaxa    BURSITIS, SUBTROCHANTERIC    HYPERTENSION    MITRAL VALVE PROLAPSE    Mixed hyperlipidemia    Past Surgical History:  Procedure Laterality Date   DILATION AND CURETTAGE OF UTERUS     TONSILLECTOMY     Patient Active Problem List   Diagnosis Date Noted   Wrist pain, acute, right 11/30/2023   Thyroid  nodule 11/29/2023   Fall 11/29/2023   Anxiety 10/11/2023   Memory impairment 10/03/2023   Intermittent right-sided chest pain 07/25/2023   Pruritus - legs 01/22/2023   Vitamin D  deficiency 01/22/2022   Secondary hypercoagulable state (HCC) 11/17/2020   B12 deficiency 10/28/2019   IBS (irritable bowel syndrome) 10/28/2019   Arthritis of right hand 07/22/2019   Presbycusis of both ears 05/14/2018   Prediabetes 05/15/2017   Pain of right heel 01/09/2017   Primary osteoarthritis of both feet 07/10/2016   Primary osteoarthritis of both hands 07/10/2016   Alopecia totalis 06/05/2016   Dermatochalasis of both upper eyelids 10/12/2015   H/O dysplastic nevus 08/03/2015   Degenerative lumbar disc 12/04/2013   Hematuria, undiagnosed cause 09/02/2013   Osteopenia 06/11/2013   Primary generalized (osteo)arthritis 12/18/2011   Mixed hyperlipidemia 03/25/2009   Essential hypertension  03/24/2009   ATRIAL FIBRILLATION 03/24/2009   Greater trochanteric bursitis of left hip 02/12/2007    PCP: Geofm Glade JINNY, MD  REFERRING PROVIDER: Marquette Ozell BIRCH, DO  REFERRING DIAG: [phone] (ICD-10-CM) - Pain in left hip R26.2 (ICD-10-CM) - Difficulty in walking, not elsewhere classified Bilateral OA of hip, bilateral OA of knee, pain in R hip, pain in L hip  THERAPY DIAG:  Bilateral hip pain  Pain in both knees, unspecified chronicity  Muscle weakness (generalized)  Other symptoms and signs involving the musculoskeletal system  Other abnormalities of gait and mobility  Rationale for Evaluation and Treatment: Rehabilitation  ONSET DATE: chronic   SUBJECTIVE:   SUBJECTIVE STATEMENT:  Pt reports feeling good. Doing well after sessions, able to run errands following and not too warn out.   EVAL: Patient states she just joined the gym a few weeks ago. Then had a problem with her hip. Ruptured disc in back twice. Patient states hip pain for a long time. Shots helped a little in R. R hip is worse than the left. On way to [place]  she had a pain in R glute where she had to go to ER. L hip has some pain as well. Symptoms are worse with prolonged walking. Wants to able to workout again, eager to get stronger. Had weak abductors in the past. Legs are very  weak and has trouble with stairs. L knee bothers her and troubles with squats and lunges.  PERTINENT HISTORY: hypertension, bilateral hip arthritis, atrial fibrillation  PAIN:  Are you having pain? No at rest  PRECAUTIONS: None  WEIGHT BEARING RESTRICTIONS: No  FALLS:  Has patient fallen in last 6 months? No  PLOF: Independent  PATIENT GOALS: get stronger, exercise   OBJECTIVE: (objective measures from initial evaluation unless otherwise dated)  PATIENT SURVEYS:  LEFS 31/80            6/5: 43/80 COGNITION: Overall cognitive status: Within functional limits for tasks assessed     SENSATION: WFL  POSTURE: No  Significant postural limitations  PALPATION: Grossly tender bilateral glutes  LOWER EXTREMITY ROM:  Active ROM Right eval Left eval  Hip flexion    Hip extension    Hip abduction    Hip adduction    Hip internal rotation    Hip external rotation    Knee flexion    Knee extension    Ankle dorsiflexion    Ankle plantarflexion    Ankle inversion    Ankle eversion     (Blank rows = not tested) *= pain/symptoms  LOWER EXTREMITY MMT:  MMT Right eval Left eval Right 6/5 Left 6/5  Hip flexion 4 4- 4 4-  Hip extension 3 3 3+ 4-  Hip abduction 3- 3 3+ 4+  Hip adduction      Hip internal rotation      Hip external rotation      Knee flexion 4+ 4+ 5 5  Knee extension 4+ 4+ 4+ 5  Ankle dorsiflexion      Ankle plantarflexion      Ankle inversion      Ankle eversion       (Blank rows = not tested) *= pain/symptoms    FUNCTIONAL TESTS:  5 times sit to stand: 31.35 seconds with hands on thighs, labored, relies L>RLE, R valgus Stairs: alternating pattern, 7 inch steps, unilateral HHA, L knee pain, decreased concentric and eccentric strength and motor control bilaterally, increased valgus on R  6/5: 5xSTS: 11.4seconds Stairs: single HHA, improved control though mild offloading noted of R LE   GAIT: Distance walked: 100 feet Assistive device utilized: None Level of assistance: Complete Independence Comments: R truncal lean on R stance, decreased hip extension bilaterally   TODAY'S TREATMENT:                                                                                                                              DATE:  02/05/24 Nustep 5 minutes LE L5 only for dynamic warm up  Step up over airex 2 x 10 bilateral Lateral step up/over airex 2x10 Mini squat lateral slide out 2 x 10 Hip hinge 3 x 10  Standing ab set/engagement Mini lunge 2 x 10   01/31/24 Nustep 5 minutes LE L5 only for dynamic warm up  Standing alternating march on airex 2  x 10 Step up over airex 2 x  10 bilateral Lateral step up/over airex 2x10 SLS with 3 way reach on airex 5 x 5 second holds STS 1 x 10 Hip hike 2 x 10  Review of HEP  01/29/24 Nustep 5 minutes LE L5 only for dynamic warm up  Supine SLR 2x10ea S/l abduction 3x10ea Bridges 3x10 LTR x8 STM to L gluteal mm LAQ 3# (trialled on L LE but painful, so stopped) x20 R LE  Step up over airex 2 x 10 bilateral Lateral step up/over airex 2x10 Side stepping along rail with RTB at ankles STS 2x10  01/24/24 Nustep 5 minutes LE L4 only for dynamic warm up  Heel toe rocks on airex  March on airex 2 x 10 Runner step up on airex x10ea Step up over airex 2 x 10 bilateral Hip hikes 4 step 2x10ea (cues for proper performance  Semi tandem on airex with cross body reaches 2x10ea Standing hip abd/ext with RTB at ankles 2x10ea     01/22/24 Nustep 5 minutes LE L4 only for dynamic warm up  March on airex 2 x 10 Step up over airex 2 x 10 bilateral SLS with 3 way reach on airex 5 x 5 second holds Hip hikes 4 step 2x10ea (cues for proper performance  Semi tandem on airex with cross body reaches x10ea Lateral step up 6 inch 2 x 10 RLE only- L knee pain Standing hip abd/ext with RTB at ankles 2x10ea Sidleying hip abduction 2x10ea Bridges 2x10  01/17/24 Nustep 5 minutes LE L4 only for dynamic warm up  March on airex 2 x 10 Step up over airex 2 x 10 bilateral SLS with 3 way reach on airex 5 x 5 second holds Hip hikes 4 step 2x10ea (cues for proper performance  Semi tandem on airex with corss body reaches Lateral step up 6 inch 2 x 10 RLE only- L knee pain Standing hip abd/ext with RTB at ankles 2x10ea Sidleying hip abduction 2x10ea Bridges 2x10    01/15/24 Nustep 5 minutes LE L4 only for dynamic warm up  March on airex 2 x 10 Step up over airex 2 x 10 bilateral SLS with 3 way reach on airex 5 x 5 second holds Lateral step up 6 inch 2 x 10  Lateral lunge 1 x 10 RLE , L attempted but poor mechanics and crepitus and  pain   01/10/24 Nustep 4 minutes LE L4 only for dynamic warm up (had increased pain with this today) Updated MMT LEFS 5xSTS Reviewed goals   Marching on airex (slow) NBOS on ariex with EC Semi tandem on airex with corss body reaches   01/08/24 Nustep 5 minutes LE L4 only for dynamic warm up Step up 6 inch 2 x 10  Lateral step up 6 inch 2 x 10  Standing hip abduction/extension RTB at ankles 2 x 10ea Sit to stands 2x10  Marching on airex (slow) NBOS airex with head turns Semi tandem on airex 2 x 30 second holds ea [facility] taps for balance-3cones x5ea bil    PATIENT EDUCATION:  Education details: Patient educated on exam findings, POC, scope of PT, HEP, relevant anatomy and pathology. 12/14/23: HEP Person educated: Patient Education method: Explanation, Demonstration, and Handouts Education comprehension: verbalized understanding, returned demonstration, verbal cues required, and tactile cues required  HOME EXERCISE PROGRAM: Access Code: [email or web address] URL: [email or web address] Date: 11/22/2023 Prepared by: Prentice Zinia Innocent  Exercises - Supine Bridge  - 1 x daily - 7 x weekly -  2 sets - 10 reps - Clamshell  - 1 x daily - 7 x weekly - 2 sets - 10 reps  ASSESSMENT:  CLINICAL IMPRESSION: Patient continues to progress well with LE strengthening and stability exercises. Crepitus and symptoms with squat exercise with L stance with limited ability to complete, good mechanics on RLE. Intermittent cueing for mechanics with good carry over. Discussed abdominal engagement for lumbar support and proximal stability. Patient will continue to benefit from physical therapy in order to improve function and reduce impairment.     OBJECTIVE IMPAIRMENTS: Abnormal gait, decreased activity tolerance, decreased balance, decreased endurance, decreased mobility, difficulty walking, decreased ROM, decreased strength, increased muscle spasms, impaired flexibility, improper body mechanics, and  pain.   ACTIVITY LIMITATIONS: carrying, lifting, bending, standing, squatting, stairs, transfers, locomotion level, and caring for others  PARTICIPATION LIMITATIONS: meal prep, cleaning, laundry, shopping, community activity, and yard work  PERSONAL FACTORS: Time since onset of injury/illness/exacerbation and 3+ comorbidities: HTN, afib, hx hip and knee pain, hx back pain are also affecting patient's functional outcome.   REHAB POTENTIAL: Good  CLINICAL DECISION MAKING: Evolving/moderate complexity  EVALUATION COMPLEXITY: Moderate   GOALS: Goals reviewed with patient? Yes  SHORT TERM GOALS: Target date: 12/20/2023    Patient will be independent with HEP in order to improve functional outcomes. Baseline: Goal status: MET 6/3  2.  Patient will report at least 25% improvement in symptoms for improved quality of life. Baseline: Goal status: MET 6/3    LONG TERM GOALS: Target date: 01/17/2024    Patient will report at least 75% improvement in symptoms for improved quality of life. Baseline:  Goal status: IN PROGRESS (50% 6/5)  2.  Patient will improve LEFS score by at least 9 points in order to indicate improved tolerance to activity. Baseline: 31/80 Goal status: MET 6/5 (43/80)  3.  Patient will be able to navigate stairs with reciprocal pattern without compensation in order to demonstrate improved LE strength. Baseline:  Goal status: IN PROGRESS (only mild compensation 6/5)  4.  Patient will demonstrate grade of 5/5 MMT grade in all tested musculature as evidence of improved strength to assist with stair ambulation and gait.  Baseline:  Goal status: IN PROGRESS 6/5  5.  Patient will be able to complete 5x STS in under 15 seconds in order to demo improved functional strength. Baseline:  Goal status: MET 6/5  6.  Patient will be able to perform full gym routine independently.  Baseline:  Goal status: INITIAL (has not yet returned to gym. 6/5)   PLAN:  PT  FREQUENCY: 2x/week  PT DURATION: 8 weeks  PLANNED INTERVENTIONS: 97164- PT Re-evaluation, 97110-Therapeutic exercises, 97530- Therapeutic activity, 97112- Neuromuscular re-education, 97535- Self Care, 02859- Manual therapy, [phone]- Gait training, [phone]- Orthotic Fit/training, [phone]- Canalith repositioning, [id number]- Aquatic Therapy, [phone]- Splinting, Patient/Family education, Balance training, Stair training, Taping, Dry Needling, Joint mobilization, Joint manipulation, Spinal manipulation, Spinal mobilization, Scar mobilization, and DME instructions.  PLAN FOR NEXT SESSION: f/u with HEP, glute and quad strength, functional strength, progress to gym routine as able    Prentice GORMAN Stains, PT 02/05/2024, 10:11 AM

## 2024-02-09 ENCOUNTER — Other Ambulatory Visit: Payer: Self-pay

## 2024-02-09 ENCOUNTER — Emergency Department (HOSPITAL_BASED_OUTPATIENT_CLINIC_OR_DEPARTMENT_OTHER)
Admission: EM | Admit: 2024-02-09 | Discharge: 2024-02-09 | Disposition: A | Discharge status: Home / Self Care | Attending: Emergency Medicine | Admitting: Emergency Medicine

## 2024-02-09 ENCOUNTER — Emergency Department (HOSPITAL_BASED_OUTPATIENT_CLINIC_OR_DEPARTMENT_OTHER)

## 2024-02-09 DIAGNOSIS — D6869 Other thrombophilia: Secondary | ICD-10-CM | POA: Diagnosis not present

## 2024-02-09 DIAGNOSIS — Z79899 Other long term (current) drug therapy: Secondary | ICD-10-CM | POA: Diagnosis not present

## 2024-02-09 DIAGNOSIS — K449 Diaphragmatic hernia without obstruction or gangrene: Secondary | ICD-10-CM | POA: Diagnosis not present

## 2024-02-09 DIAGNOSIS — M62830 Muscle spasm of back: Secondary | ICD-10-CM | POA: Insufficient documentation

## 2024-02-09 DIAGNOSIS — I10 Essential (primary) hypertension: Secondary | ICD-10-CM | POA: Diagnosis not present

## 2024-02-09 DIAGNOSIS — I700 Atherosclerosis of aorta: Secondary | ICD-10-CM | POA: Insufficient documentation

## 2024-02-09 DIAGNOSIS — D72829 Elevated white blood cell count, unspecified: Secondary | ICD-10-CM | POA: Insufficient documentation

## 2024-02-09 DIAGNOSIS — M545 Low back pain, unspecified: Secondary | ICD-10-CM | POA: Diagnosis present

## 2024-02-09 DIAGNOSIS — Z7901 Long term (current) use of anticoagulants: Secondary | ICD-10-CM | POA: Diagnosis not present

## 2024-02-09 DIAGNOSIS — R109 Unspecified abdominal pain: Secondary | ICD-10-CM | POA: Insufficient documentation

## 2024-02-09 LAB — URINALYSIS, ROUTINE W REFLEX MICROSCOPIC
Bilirubin Urine: NEGATIVE
Glucose, UA: NEGATIVE mg/dL
Ketones, ur: NEGATIVE mg/dL
Nitrite: NEGATIVE
Protein, ur: NEGATIVE mg/dL
Specific Gravity, Urine: 1.021 (ref 1.005–1.030)
pH: 6 (ref 5.0–8.0)

## 2024-02-09 LAB — COMPREHENSIVE METABOLIC PANEL WITH GFR
ALT: 11 U/L (ref 0–44)
AST: 18 U/L (ref 15–41)
Albumin: 4.4 g/dL (ref 3.5–5.0)
Alkaline Phosphatase: 124 U/L (ref 38–126)
Anion gap: 14 (ref 5–15)
BUN: 24 mg/dL — ABNORMAL HIGH (ref 8–23)
CO2: 25 mmol/L (ref 22–32)
Calcium: 10.2 mg/dL (ref 8.9–10.3)
Chloride: 102 mmol/L (ref 98–111)
Creatinine, Ser: 0.97 mg/dL (ref 0.44–1.00)
GFR, Estimated: >60 mL/min (ref >60)
Glucose, Bld: 107 mg/dL — ABNORMAL HIGH (ref 70–99)
Potassium: 3.6 mmol/L (ref 3.5–5.1)
Sodium: 140 mmol/L (ref 135–145)
Total Bilirubin: 0.4 mg/dL (ref 0.0–1.2)
Total Protein: 7.4 g/dL (ref 6.5–8.1)

## 2024-02-09 LAB — CBC WITH DIFFERENTIAL/PLATELET
Abs Immature Granulocytes: 0.05 K/uL (ref 0.00–0.07)
Basophils Absolute: 0.1 K/uL (ref 0.0–0.1)
Basophils Relative: 1 %
Eosinophils Absolute: 0.1 K/uL (ref 0.0–0.5)
Eosinophils Relative: 1 %
HCT: 42.4 % (ref 36.0–46.0)
Hemoglobin: 14.2 g/dL (ref 12.0–15.0)
Immature Granulocytes: 0 %
Lymphocytes Relative: 26 %
Lymphs Abs: 3.4 K/uL (ref 0.7–4.0)
MCH: 31.2 pg (ref 26.0–34.0)
MCHC: 33.5 g/dL (ref 30.0–36.0)
MCV: 93.2 fL (ref 80.0–100.0)
Monocytes Absolute: 1 K/uL (ref 0.1–1.0)
Monocytes Relative: 8 %
Neutro Abs: 8.3 K/uL — ABNORMAL HIGH (ref 1.7–7.7)
Neutrophils Relative %: 64 %
Platelets: 235 K/uL (ref 150–400)
RBC: 4.55 MIL/uL (ref 3.87–5.11)
RDW: 12.6 % (ref 11.5–15.5)
WBC: 12.9 K/uL — ABNORMAL HIGH (ref 4.0–10.5)
nRBC: 0 % (ref 0.0–0.2)

## 2024-02-09 LAB — LIPASE, BLOOD: Lipase: 34 U/L (ref 11–51)

## 2024-02-09 MED ORDER — FENTANYL CITRATE PF 50 MCG/ML IJ SOSY
25.0000 ug | PREFILLED_SYRINGE | Freq: Once | INTRAMUSCULAR | Status: AC
  Administered 2024-02-09: 25 ug via INTRAVENOUS
  Filled 2024-02-09: qty 1

## 2024-02-09 MED ORDER — LIDOCAINE 5 % EX PTCH
1.0000 | MEDICATED_PATCH | CUTANEOUS | Status: DC
  Administered 2024-02-09: 1 via TRANSDERMAL
  Filled 2024-02-09: qty 1

## 2024-02-09 NOTE — Discharge Instructions (Addendum)
 For pain control you may take 1000 mg of acetaminophen  (Tylenol ) every 8 hours as needed. Additionally you can take Acetaminophen  (Tylenol ), topical muscle creams such as SalonPas, [employer], Bengay, etc. Please stretch, apply ice or heat (whichever helps), and have massage therapy for additional assistance.   Please limit 24 hr usage of the following medicines: - Acetaminophen  (Tylenol ) to 4000 mg    Please note that other over-the-counter medicine may contain acetaminophen  as a component of their ingredients.

## 2024-02-09 NOTE — ED Notes (Signed)
 Pt reports the pain is constant now where it was intermittent.

## 2024-02-09 NOTE — ED Triage Notes (Signed)
 Pt reports concern for left lateral abdominal pain that radiates into the back. No NVD. No urinary s/s.

## 2024-02-09 NOTE — ED Notes (Signed)
 Heat pack applied to pt's left lower back per EDP request.

## 2024-02-09 NOTE — ED Provider Notes (Signed)
 [facility]MENT AT [facility] Provider Note  CSN: [id number] Arrival date & time: 02/09/24 0241  Chief Complaint(s) Abdominal Pain and Back Pain (L)  HPI [name] is a 78 y.o. female here for left flank pain that began around midnight, 3 hours prior to arrival.  Patient initially mild nagging ache.  Increased in intensity and has been fluctuating since without alleviating or aggravating factors including movement.  Patient denied any associated nausea or vomiting.  No diarrhea.  No dysuria or urgency.  No trauma.  The history is provided by the patient.    Past Medical History Past Medical History:  Diagnosis Date   ARTHRITIS, HIPS, BILATERAL    Atrial fibrillation (HCC)    chronic anticoag - Pradaxa    BURSITIS, SUBTROCHANTERIC    HYPERTENSION    MITRAL VALVE PROLAPSE    Mixed hyperlipidemia    Patient Active Problem List   Diagnosis Date Noted   Wrist pain, acute, right 11/30/2023   Thyroid  nodule 11/29/2023   Fall 11/29/2023   Anxiety 10/11/2023   Memory impairment 10/03/2023   Intermittent right-sided chest pain 07/25/2023   Pruritus - legs 01/22/2023   Vitamin D  deficiency 01/22/2022   Secondary hypercoagulable state (HCC) 11/17/2020   B12 deficiency 10/28/2019   IBS (irritable bowel syndrome) 10/28/2019   Arthritis of right hand 07/22/2019   Presbycusis of both ears 05/14/2018   Prediabetes 05/15/2017   Pain of right heel 01/09/2017   Primary osteoarthritis of both feet 07/10/2016   Primary osteoarthritis of both hands 07/10/2016   Alopecia totalis 06/05/2016   Dermatochalasis of both upper eyelids 10/12/2015   H/O dysplastic nevus 08/03/2015   Degenerative lumbar disc 12/04/2013   Hematuria, undiagnosed cause 09/02/2013   Osteopenia 06/11/2013   Primary generalized (osteo)arthritis 12/18/2011   Mixed hyperlipidemia 03/25/2009   Essential hypertension 03/24/2009   ATRIAL FIBRILLATION 03/24/2009   Greater trochanteric  bursitis of left hip 02/12/2007   Home Medication(s) Prior to Admission medications   Medication Sig Start Date End Date Taking? Authorizing Provider  acetaminophen  (TYLENOL ) 650 MG CR tablet Take 650 mg by mouth 2 (two) times daily.     [provider]  bifidobacterium infantis (ALIGN) capsule Take 1 capsule by mouth daily. 10/14/14   Inocencio Berwyn LABOR, MD  cholecalciferol (VITAMIN D ) 1000 UNITS tablet Take 2,000 Units by mouth daily.    [provider]  co-enzyme Q-10 50 MG capsule Take 50 mg by mouth daily.    [provider]  diltiazem  (CARDIZEM  CD) 180 MG 24 hr capsule TAKE 1 CAPSULE BY MOUTH TWICE A DAY 10/26/23   Burns, Glade JINNY, MD  diltiazem  (CARDIZEM ) 30 MG tablet TAKE 1 TABLET EVERY 4 HOURS AS NEEDED FOR AFIB HEART RATE >100 01/22/23   Geofm Glade JINNY, MD  DULoxetine  (CYMBALTA ) 30 MG capsule TAKE 1 CAPSULE BY MOUTH EVERY DAY 01/25/24   Geofm Glade JINNY, MD  hydrochlorothiazide  (HYDRODIURIL ) 25 MG tablet TAKE 1 TABLET (25 MG TOTAL) BY MOUTH DAILY. 10/26/23   Geofm Glade JINNY, MD  HYDROcodone -acetaminophen  (NORCO/VICODIN) 5-325 MG tablet Take 1 tablet by mouth every 6 (six) hours as needed for severe pain (pain score 7-10). 10/11/23   Burns, Glade JINNY, MD  KLOR-CON  M10 10 MEQ tablet TAKE 1 TABLET BY MOUTH THREE TIMES A DAY Patient taking differently: Take 10 mEq by mouth daily. 10/26/23   Geofm Glade JINNY, MD  lisinopril  (ZESTRIL ) 40 MG tablet TAKE 1 TABLET BY MOUTH EVERY DAY 01/03/24   Geofm Glade JINNY,  MD  magnesium oxide (MAG-OX) 400 MG tablet Take 400 mg by mouth daily.    [provider]  memantine  (NAMENDA ) 5 MG tablet Take 1 tablet (5 mg at night) for 2 weeks, then increase to 1 tablet (5 mg) twice a day 10/03/23   Wertman, Sara E, PA-C  pravastatin  (PRAVACHOL ) 40 MG tablet Take 1 tablet (40 mg total) by mouth daily. 01/16/23   Geofm Glade PARAS, MD  rivaroxaban  (XARELTO ) 20 MG TABS tablet Take 1 tablet (20 mg total) by mouth daily with supper. 07/25/23   Geofm Glade PARAS, MD                                                                                                                                    Allergies Nickel  Review of Systems Review of Systems As noted in HPI  Physical Exam Vital Signs  I have reviewed the triage vital signs BP (!) 120/48 (BP Location: Left Arm)   Pulse 85   Temp 98.1 F (36.7 C) (Oral)   Resp (!) 21   SpO2 99%   Physical Exam Vitals reviewed.  Constitutional:      General: She is not in acute distress.    Appearance: She is well-developed. She is not diaphoretic.  HENT:     Head: Normocephalic and atraumatic.     Right Ear: External ear normal.     Left Ear: External ear normal.     Nose: Nose normal.  Eyes:     General: No scleral icterus.    Conjunctiva/sclera: Conjunctivae normal.  Neck:     Trachea: Phonation normal.  Cardiovascular:     Rate and Rhythm: Normal rate and regular rhythm.  Pulmonary:     Effort: Pulmonary effort is normal. No respiratory distress.     Breath sounds: No stridor.  Abdominal:     General: There is no distension.     Tenderness: There is left CVA tenderness.  Musculoskeletal:        General: Normal range of motion.     Cervical back: Normal range of motion.     Lumbar back: Spasms and tenderness present.       Back:  Neurological:     Mental Status: She is alert and oriented to person, place, and time.  Psychiatric:        Behavior: Behavior normal.     ED Results and Treatments Labs (all labs ordered are listed, but only abnormal results are displayed) Labs Reviewed  CBC WITH DIFFERENTIAL/PLATELET - Abnormal; Notable for the following components:      Result Value   WBC 12.9 (*)    Neutro Abs 8.3 (*)    All other components within normal limits  URINALYSIS, ROUTINE W REFLEX MICROSCOPIC - Abnormal; Notable for the following components:   Hgb urine dipstick MODERATE (*)    Leukocytes,Ua LARGE (*)    Bacteria, UA RARE (*)  All other components within  normal limits  COMPREHENSIVE METABOLIC PANEL WITH GFR - Abnormal; Notable for the following components:   Glucose, Bld 107 (*)    BUN 24 (*)    All other components within normal limits  LIPASE, BLOOD                                                                                                                         EKG  EKG Interpretation Date/Time:    Ventricular Rate:    PR Interval:    QRS Duration:    QT Interval:    QTC Calculation:   R Axis:      Text Interpretation:         Radiology CT Renal Stone Study Result Date: 02/09/2024 CLINICAL DATA:  Abdominal and flank pain with stone suspected. Left lateral abdominal pain radiating to the back. EXAM: CT ABDOMEN AND PELVIS WITHOUT CONTRAST TECHNIQUE: Multidetector CT imaging of the abdomen and pelvis was performed following the standard protocol without IV contrast. RADIATION DOSE REDUCTION: This exam was performed according to the departmental dose-optimization program which includes automated exposure control, adjustment of the mA and/or kV according to patient size and/or use of iterative reconstruction technique. COMPARISON:  CT abdomen and pelvis 07/02/2013. MRI pelvis 01/23/2021 FINDINGS: Lower chest: Atelectasis or scarring in the medial left lung base. Small esophageal hiatal hernia. Hepatobiliary: No focal liver abnormality is seen. No gallstones, gallbladder wall thickening, or biliary dilatation. Pancreas: Unremarkable. No pancreatic ductal dilatation or surrounding inflammatory changes. Spleen: Normal in size without focal abnormality. Adrenals/Urinary Tract: Adrenal glands are unremarkable. Kidneys are normal, without renal calculi, focal lesion, or hydronephrosis. Bladder is unremarkable. Stomach/Bowel: Stomach is within normal limits. Appendix not identified. No evidence of bowel wall thickening, distention, or inflammatory changes. Vascular/Lymphatic: Aortic atherosclerosis. No enlarged abdominal or pelvic lymph nodes.  Reproductive: Uterus and bilateral adnexa are unremarkable. Other: No abdominal wall hernia or abnormality. No abdominopelvic ascites. Musculoskeletal: No acute or significant osseous findings. IMPRESSION: 1. No renal or ureteral stone or obstruction. 2. No acute process demonstrated in the abdomen or pelvis. 3. Aortic atherosclerosis. Electronically Signed   By: Elsie Gravely M.D.   On: 02/09/2024 03:22    Medications Ordered in ED Medications  lidocaine  (LIDODERM ) 5 % 1 patch (1 patch Transdermal Patch Applied 02/09/24 0526)  fentaNYL  (SUBLIMAZE ) injection 25 mcg (25 mcg Intravenous Given 02/09/24 0322)   Procedures Procedures  (including critical care time) Medical Decision Making / ED Course   Medical Decision Making Amount and/or Complexity of Data Reviewed Labs: ordered. Decision-making details documented in ED Course. Radiology: ordered and independent interpretation performed. Decision-making details documented in ED Course.  Risk Prescription drug management.    Left flank pain Differential diagnosis considered  CBC with mild leukocytosis.  No anemia.  CMP without significant electrolyte derangements or renal sufficiency.  No evidence of bili obstruction or pancreatitis. UA with hematuria and positive leuks.  Patient reports a history of chronic hematuria and when  UA compared to priors is relatively similar.  Given her lack of urinary symptoms and relatively unchanged UA, it is unlikely infectious etiology.  CT scan was negative for any stones or other intra-abdominal inflammatory/infectious process.  Patient provided with IV pain meds with did not provide any relief.  On reevaluation, patient reports that her pain is gotten worse with positional changes.  With a spastic tender musculature, likely muscle strain/spasm.  Lidocaine  patch and heat applied.  Patient already has muscle relaxers at home    Final Clinical Impression(s) / ED Diagnoses Final diagnoses:  Acute  left-sided low back pain without sciatica   The patient appears reasonably screened and/or stabilized for discharge and I doubt any other medical condition or other [facility] requiring further screening, evaluation, or treatment in the ED at this time. I have discussed the findings, Dx and Tx plan with the patient/family who expressed understanding and agree(s) with the plan. Discharge instructions discussed at length. The patient/family was given strict return precautions who verbalized understanding of the instructions. No further questions at time of discharge.  Disposition: Discharge  Condition: Good  ED Discharge Orders     None       Follow Up: Geofm Glade PARAS, MD [address] [place] [address] [phone]  Call  to schedule an appointment for close follow up    This chart was dictated using voice recognition software.  Despite best efforts to proofread,  errors can occur which can change the documentation meaning.    Trine Raynell Moder, MD 02/09/24 [phone]

## 2024-02-09 NOTE — ED Notes (Signed)
 Labs collected & sent to the lab using temporary pt labels as sunquest wasn't loading.

## 2024-02-09 NOTE — ED Notes (Signed)
 ED Provider at bedside.

## 2024-02-09 NOTE — ED Notes (Signed)
Pt independently ambulated to restroom with steady gait

## 2024-02-13 DIAGNOSIS — M25562 Pain in left knee: Secondary | ICD-10-CM | POA: Diagnosis not present

## 2024-02-13 DIAGNOSIS — M5459 Other low back pain: Secondary | ICD-10-CM | POA: Diagnosis not present

## 2024-02-13 DIAGNOSIS — M9903 Segmental and somatic dysfunction of lumbar region: Secondary | ICD-10-CM | POA: Diagnosis not present

## 2024-02-13 DIAGNOSIS — M9906 Segmental and somatic dysfunction of lower extremity: Secondary | ICD-10-CM | POA: Diagnosis not present

## 2024-02-13 DIAGNOSIS — M11869 Other specified crystal arthropathies, unspecified knee: Secondary | ICD-10-CM | POA: Diagnosis not present

## 2024-02-14 ENCOUNTER — Encounter (HOSPITAL_BASED_OUTPATIENT_CLINIC_OR_DEPARTMENT_OTHER): Admitting: Physical Therapy

## 2024-02-18 ENCOUNTER — Ambulatory Visit (HOSPITAL_BASED_OUTPATIENT_CLINIC_OR_DEPARTMENT_OTHER)

## 2024-02-18 ENCOUNTER — Encounter (HOSPITAL_BASED_OUTPATIENT_CLINIC_OR_DEPARTMENT_OTHER): Payer: Self-pay

## 2024-02-18 DIAGNOSIS — M25561 Pain in right knee: Secondary | ICD-10-CM | POA: Diagnosis not present

## 2024-02-18 DIAGNOSIS — R2689 Other abnormalities of gait and mobility: Secondary | ICD-10-CM

## 2024-02-18 DIAGNOSIS — R29898 Other symptoms and signs involving the musculoskeletal system: Secondary | ICD-10-CM | POA: Diagnosis not present

## 2024-02-18 DIAGNOSIS — M6281 Muscle weakness (generalized): Secondary | ICD-10-CM

## 2024-02-18 DIAGNOSIS — M25562 Pain in left knee: Secondary | ICD-10-CM

## 2024-02-18 DIAGNOSIS — M25551 Pain in right hip: Secondary | ICD-10-CM

## 2024-02-18 DIAGNOSIS — M25552 Pain in left hip: Secondary | ICD-10-CM | POA: Diagnosis not present

## 2024-02-18 NOTE — Therapy (Signed)
 OUTPATIENT PHYSICAL THERAPY TREATMENT       Patient Name: [name] MRN: [id number] DOB:Mar 11, 1946, 78 y.o., female Today's Date: 02/18/2024  END OF SESSION:  PT End of Session - 02/18/24 0931     Visit Number 18    Number of Visits 32    Date for PT Re-Evaluation 03/11/24    Authorization Type MEDICARE    Progress Note Due on Visit 20    PT Start Time 0933    PT Stop Time 1015    PT Time Calculation (min) 42 min    Activity Tolerance Patient tolerated treatment well    Behavior During Therapy WFL for tasks assessed/performed                     Past Medical History:  Diagnosis Date   ARTHRITIS, HIPS, BILATERAL    Atrial fibrillation (HCC)    chronic anticoag - Pradaxa    BURSITIS, SUBTROCHANTERIC    HYPERTENSION    MITRAL VALVE PROLAPSE    Mixed hyperlipidemia    Past Surgical History:  Procedure Laterality Date   DILATION AND CURETTAGE OF UTERUS     TONSILLECTOMY     Patient Active Problem List   Diagnosis Date Noted   Wrist pain, acute, right 11/30/2023   Thyroid  nodule 11/29/2023   Fall 11/29/2023   Anxiety 10/11/2023   Memory impairment 10/03/2023   Intermittent right-sided chest pain 07/25/2023   Pruritus - legs 01/22/2023   Vitamin D  deficiency 01/22/2022   Secondary hypercoagulable state (HCC) 11/17/2020   B12 deficiency 10/28/2019   IBS (irritable bowel syndrome) 10/28/2019   Arthritis of right hand 07/22/2019   Presbycusis of both ears 05/14/2018   Prediabetes 05/15/2017   Pain of right heel 01/09/2017   Primary osteoarthritis of both feet 07/10/2016   Primary osteoarthritis of both hands 07/10/2016   Alopecia totalis 06/05/2016   Dermatochalasis of both upper eyelids 10/12/2015   H/O dysplastic nevus 08/03/2015   Degenerative lumbar disc 12/04/2013   Hematuria, undiagnosed cause 09/02/2013   Osteopenia 06/11/2013   Primary generalized (osteo)arthritis 12/18/2011   Mixed hyperlipidemia 03/25/2009   Essential  hypertension 03/24/2009   ATRIAL FIBRILLATION 03/24/2009   Greater trochanteric bursitis of left hip 02/12/2007    PCP: Geofm Glade JINNY, MD  REFERRING PROVIDER: Marquette Ozell BIRCH, DO  REFERRING DIAG: [phone] (ICD-10-CM) - Pain in left hip R26.2 (ICD-10-CM) - Difficulty in walking, not elsewhere classified Bilateral OA of hip, bilateral OA of knee, pain in R hip, pain in L hip  THERAPY DIAG:  Bilateral hip pain  Pain in both knees, unspecified chronicity  Muscle weakness (generalized)  Other symptoms and signs involving the musculoskeletal system  Other abnormalities of gait and mobility  Rationale for Evaluation and Treatment: Rehabilitation  ONSET DATE: chronic   SUBJECTIVE:   SUBJECTIVE STATEMENT:  Pt reports she went to ED on 7/5 for L low back pain. I thought it was kidney stones at first. States they said it was muscle spasms.   EVAL: Patient states she just joined the gym a few weeks ago. Then had a problem with her hip. Ruptured disc in back twice. Patient states hip pain for a long time. Shots helped a little in R. R hip is worse than the left. On way to [place]  she had a pain in R glute where she had to go to ER. L hip has some pain as well. Symptoms are worse with prolonged walking. Wants to able to workout again, eager to  get stronger. Had weak abductors in the past. Legs are very weak and has trouble with stairs. L knee bothers her and troubles with squats and lunges.  PERTINENT HISTORY: hypertension, bilateral hip arthritis, atrial fibrillation  PAIN:  Are you having pain? No at rest  PRECAUTIONS: None  WEIGHT BEARING RESTRICTIONS: No  FALLS:  Has patient fallen in last 6 months? No  PLOF: Independent  PATIENT GOALS: get stronger, exercise   OBJECTIVE: (objective measures from initial evaluation unless otherwise dated)  PATIENT SURVEYS:  LEFS 31/80            6/5: 43/80 COGNITION: Overall cognitive status: Within functional limits for tasks  assessed     SENSATION: WFL  POSTURE: No Significant postural limitations  PALPATION: Grossly tender bilateral glutes  LOWER EXTREMITY ROM:  Active ROM Right eval Left eval  Hip flexion    Hip extension    Hip abduction    Hip adduction    Hip internal rotation    Hip external rotation    Knee flexion    Knee extension    Ankle dorsiflexion    Ankle plantarflexion    Ankle inversion    Ankle eversion     (Blank rows = not tested) *= pain/symptoms  LOWER EXTREMITY MMT:  MMT Right eval Left eval Right 6/5 Left 6/5  Hip flexion 4 4- 4 4-  Hip extension 3 3 3+ 4-  Hip abduction 3- 3 3+ 4+  Hip adduction      Hip internal rotation      Hip external rotation      Knee flexion 4+ 4+ 5 5  Knee extension 4+ 4+ 4+ 5  Ankle dorsiflexion      Ankle plantarflexion      Ankle inversion      Ankle eversion       (Blank rows = not tested) *= pain/symptoms    FUNCTIONAL TESTS:  5 times sit to stand: 31.35 seconds with hands on thighs, labored, relies L>RLE, R valgus Stairs: alternating pattern, 7 inch steps, unilateral HHA, L knee pain, decreased concentric and eccentric strength and motor control bilaterally, increased valgus on R  6/5: 5xSTS: 11.4seconds Stairs: single HHA, improved control though mild offloading noted of R LE   GAIT: Distance walked: 100 feet Assistive device utilized: None Level of assistance: Complete Independence Comments: R truncal lean on R stance, decreased hip extension bilaterally   TODAY'S TREATMENT:                                                                                                                              DATE:   02/18/24 Nustep 5 minutes LE L5 only for dynamic warm up  DKTC  DKTC with rocking Piriformis stretching 20sec x3bil PPT 5 x10 Supine marching 2x10 Sidelying hip adduction 2x10ea Step up over airex 2 x 10 bilateral Lateral step up/over airex 2x10 Tandem balance on floor 2x30sec ea HEP- added  stretches  02/05/24 Nustep 5 minutes LE L5 only for dynamic warm up  Step up over airex 2 x 10 bilateral Lateral step up/over airex 2x10 Mini squat lateral slide out 2 x 10 Hip hinge 3 x 10  Standing ab set/engagement Mini lunge 2 x 10   01/31/24 Nustep 5 minutes LE L5 only for dynamic warm up  Standing alternating march on airex 2 x 10 Step up over airex 2 x 10 bilateral Lateral step up/over airex 2x10 SLS with 3 way reach on airex 5 x 5 second holds STS 1 x 10 Hip hike 2 x 10  Review of HEP  01/29/24 Nustep 5 minutes LE L5 only for dynamic warm up  Supine SLR 2x10ea S/l abduction 3x10ea Bridges 3x10 LTR x8 STM to L gluteal mm LAQ 3# (trialled on L LE but painful, so stopped) x20 R LE  Step up over airex 2 x 10 bilateral Lateral step up/over airex 2x10 Side stepping along rail with RTB at ankles STS 2x10  01/24/24 Nustep 5 minutes LE L4 only for dynamic warm up  Heel toe rocks on airex  March on airex 2 x 10 Runner step up on airex x10ea Step up over airex 2 x 10 bilateral Hip hikes 4 step 2x10ea (cues for proper performance  Semi tandem on airex with cross body reaches 2x10ea Standing hip abd/ext with RTB at ankles 2x10ea    PATIENT EDUCATION:  Education details: Patient educated on exam findings, POC, scope of PT, HEP, relevant anatomy and pathology. 12/14/23: HEP Person educated: Patient Education method: Explanation, Demonstration, and Handouts Education comprehension: verbalized understanding, returned demonstration, verbal cues required, and tactile cues required  HOME EXERCISE PROGRAM: Access Code: [email or web address] URL: [email or web address] Date: 11/22/2023 Prepared by: Prentice Zaunegger  Exercises - Supine Bridge  - 1 x daily - 7 x weekly - 2 sets - 10 reps - Clamshell  - 1 x daily - 7 x weekly - 2 sets - 10 reps  ASSESSMENT:  CLINICAL IMPRESSION: Instructed pt in stretching for hips and lumbar region to decrease tightness here and allow for  improved functional mobility. She reported benefit from these so added to HEP. No complaints with standing exercises, though she did demonstrate mild instability when on airex pad. Challenged initially with tandem stance on floor, but this improved after period of adjustment. Will continue to progress as tolerated while monitoring lumbar symptoms.      OBJECTIVE IMPAIRMENTS: Abnormal gait, decreased activity tolerance, decreased balance, decreased endurance, decreased mobility, difficulty walking, decreased ROM, decreased strength, increased muscle spasms, impaired flexibility, improper body mechanics, and pain.   ACTIVITY LIMITATIONS: carrying, lifting, bending, standing, squatting, stairs, transfers, locomotion level, and caring for others  PARTICIPATION LIMITATIONS: meal prep, cleaning, laundry, shopping, community activity, and yard work  PERSONAL FACTORS: Time since onset of injury/illness/exacerbation and 3+ comorbidities: HTN, afib, hx hip and knee pain, hx back pain are also affecting patient's functional outcome.   REHAB POTENTIAL: Good  CLINICAL DECISION MAKING: Evolving/moderate complexity  EVALUATION COMPLEXITY: Moderate   GOALS: Goals reviewed with patient? Yes  SHORT TERM GOALS: Target date: 12/20/2023    Patient will be independent with HEP in order to improve functional outcomes. Baseline: Goal status: MET 6/3  2.  Patient will report at least 25% improvement in symptoms for improved quality of life. Baseline: Goal status: MET 6/3    LONG TERM GOALS: Target date: 01/17/2024    Patient will report at least 75% improvement in symptoms for improved quality of life. Baseline:  Goal status: IN PROGRESS (50% 6/5)  2.  Patient will improve LEFS score by at least 9 points in order to indicate improved tolerance to activity. Baseline: 31/80 Goal status: MET 6/5 (43/80)  3.  Patient will be able to navigate stairs with reciprocal pattern without compensation in  order to demonstrate improved LE strength. Baseline:  Goal status: IN PROGRESS (only mild compensation 6/5)  4.  Patient will demonstrate grade of 5/5 MMT grade in all tested musculature as evidence of improved strength to assist with stair ambulation and gait.  Baseline:  Goal status: IN PROGRESS 6/5  5.  Patient will be able to complete 5x STS in under 15 seconds in order to demo improved functional strength. Baseline:  Goal status: MET 6/5  6.  Patient will be able to perform full gym routine independently.  Baseline:  Goal status: INITIAL (has not yet returned to gym. 6/5)   PLAN:  PT FREQUENCY: 2x/week  PT DURATION: 8 weeks  PLANNED INTERVENTIONS: [id number]- PT Re-evaluation, 97110-Therapeutic exercises, 97530- Therapeutic activity, 97112- Neuromuscular re-education, 97535- Self Care, [id number]- Manual therapy, [phone]- Gait training, [phone]- Orthotic Fit/training, [phone]- Canalith repositioning, [id number]- Aquatic Therapy, [phone]- Splinting, Patient/Family education, Balance training, Stair training, Taping, Dry Needling, Joint mobilization, Joint manipulation, Spinal manipulation, Spinal mobilization, Scar mobilization, and DME instructions.  PLAN FOR NEXT SESSION: f/u with HEP, glute and quad strength, functional strength, progress to gym routine as able    Asberry FORBES Rodes, PTA 02/18/2024, 10:39 AM

## 2024-02-20 ENCOUNTER — Encounter (HOSPITAL_BASED_OUTPATIENT_CLINIC_OR_DEPARTMENT_OTHER): Payer: Self-pay | Admitting: Physical Therapy

## 2024-02-20 ENCOUNTER — Ambulatory Visit (HOSPITAL_BASED_OUTPATIENT_CLINIC_OR_DEPARTMENT_OTHER): Admitting: Physical Therapy

## 2024-02-20 DIAGNOSIS — M25551 Pain in right hip: Secondary | ICD-10-CM | POA: Diagnosis not present

## 2024-02-20 DIAGNOSIS — R29898 Other symptoms and signs involving the musculoskeletal system: Secondary | ICD-10-CM | POA: Diagnosis not present

## 2024-02-20 DIAGNOSIS — M25561 Pain in right knee: Secondary | ICD-10-CM

## 2024-02-20 DIAGNOSIS — M6281 Muscle weakness (generalized): Secondary | ICD-10-CM | POA: Diagnosis not present

## 2024-02-20 DIAGNOSIS — M25552 Pain in left hip: Secondary | ICD-10-CM | POA: Diagnosis not present

## 2024-02-20 DIAGNOSIS — M25562 Pain in left knee: Secondary | ICD-10-CM | POA: Diagnosis not present

## 2024-02-20 DIAGNOSIS — R2689 Other abnormalities of gait and mobility: Secondary | ICD-10-CM

## 2024-02-20 NOTE — Therapy (Signed)
 OUTPATIENT PHYSICAL THERAPY TREATMENT       Patient Name: KYMBERLEY RAZ MRN: [id number] DOB:11-05-1945, 78 y.o., female Today's Date: 02/20/2024  END OF SESSION:  PT End of Session - 02/20/24 1433     Visit Number 19    Number of Visits 32    Date for PT Re-Evaluation 03/11/24    Authorization Type MEDICARE    Progress Note Due on Visit 20    PT Start Time 1432    PT Stop Time 1512    PT Time Calculation (min) 40 min    Activity Tolerance Patient tolerated treatment well    Behavior During Therapy WFL for tasks assessed/performed                     Past Medical History:  Diagnosis Date   ARTHRITIS, HIPS, BILATERAL    Atrial fibrillation (HCC)    chronic anticoag - Pradaxa    BURSITIS, SUBTROCHANTERIC    HYPERTENSION    MITRAL VALVE PROLAPSE    Mixed hyperlipidemia    Past Surgical History:  Procedure Laterality Date   DILATION AND CURETTAGE OF UTERUS     TONSILLECTOMY     Patient Active Problem List   Diagnosis Date Noted   Wrist pain, acute, right 11/30/2023   Thyroid  nodule 11/29/2023   Fall 11/29/2023   Anxiety 10/11/2023   Memory impairment 10/03/2023   Intermittent right-sided chest pain 07/25/2023   Pruritus - legs 01/22/2023   Vitamin D  deficiency 01/22/2022   Secondary hypercoagulable state (HCC) 11/17/2020   B12 deficiency 10/28/2019   IBS (irritable bowel syndrome) 10/28/2019   Arthritis of right hand 07/22/2019   Presbycusis of both ears 05/14/2018   Prediabetes 05/15/2017   Pain of right heel 01/09/2017   Primary osteoarthritis of both feet 07/10/2016   Primary osteoarthritis of both hands 07/10/2016   Alopecia totalis 06/05/2016   Dermatochalasis of both upper eyelids 10/12/2015   H/O dysplastic nevus 08/03/2015   Degenerative lumbar disc 12/04/2013   Hematuria, undiagnosed cause 09/02/2013   Osteopenia 06/11/2013   Primary generalized (osteo)arthritis 12/18/2011   Mixed hyperlipidemia 03/25/2009   Essential  hypertension 03/24/2009   ATRIAL FIBRILLATION 03/24/2009   Greater trochanteric bursitis of left hip 02/12/2007    PCP: Geofm Glade JINNY, MD  REFERRING PROVIDER: Marquette Ozell BIRCH, DO  REFERRING DIAG: [phone] (ICD-10-CM) - Pain in left hip R26.2 (ICD-10-CM) - Difficulty in walking, not elsewhere classified Bilateral OA of hip, bilateral OA of knee, pain in R hip, pain in L hip  THERAPY DIAG:  Bilateral hip pain  Pain in both knees, unspecified chronicity  Muscle weakness (generalized)  Other symptoms and signs involving the musculoskeletal system  Other abnormalities of gait and mobility  Rationale for Evaluation and Treatment: Rehabilitation  ONSET DATE: chronic   SUBJECTIVE:   SUBJECTIVE STATEMENT:  Pt reports doing well until her back started bothering her. Low back feels like heavy fatigue. Been doing HEP.   EVAL: Patient states she just joined the gym a few weeks ago. Then had a problem with her hip. Ruptured disc in back twice. Patient states hip pain for a long time. Shots helped a little in R. R hip is worse than the left. On way to [place]  she had a pain in R glute where she had to go to ER. L hip has some pain as well. Symptoms are worse with prolonged walking. Wants to able to workout again, eager to get stronger. Had weak abductors in the past. Legs  are very weak and has trouble with stairs. L knee bothers her and troubles with squats and lunges.  PERTINENT HISTORY: hypertension, bilateral hip arthritis, atrial fibrillation  PAIN:  Are you having pain? No at rest  PRECAUTIONS: None  WEIGHT BEARING RESTRICTIONS: No  FALLS:  Has patient fallen in last 6 months? No  PLOF: Independent  PATIENT GOALS: get stronger, exercise   OBJECTIVE: (objective measures from initial evaluation unless otherwise dated)  PATIENT SURVEYS:  LEFS 31/80            6/5: 43/80 COGNITION: Overall cognitive status: Within functional limits for tasks  assessed     SENSATION: WFL  POSTURE: No Significant postural limitations  PALPATION: Grossly tender bilateral glutes  LOWER EXTREMITY ROM:  Active ROM Right eval Left eval  Hip flexion    Hip extension    Hip abduction    Hip adduction    Hip internal rotation    Hip external rotation    Knee flexion    Knee extension    Ankle dorsiflexion    Ankle plantarflexion    Ankle inversion    Ankle eversion     (Blank rows = not tested) *= pain/symptoms  LOWER EXTREMITY MMT:  MMT Right eval Left eval Right 6/5 Left 6/5  Hip flexion 4 4- 4 4-  Hip extension 3 3 3+ 4-  Hip abduction 3- 3 3+ 4+  Hip adduction      Hip internal rotation      Hip external rotation      Knee flexion 4+ 4+ 5 5  Knee extension 4+ 4+ 4+ 5  Ankle dorsiflexion      Ankle plantarflexion      Ankle inversion      Ankle eversion       (Blank rows = not tested) *= pain/symptoms    FUNCTIONAL TESTS:  5 times sit to stand: 31.35 seconds with hands on thighs, labored, relies L>RLE, R valgus Stairs: alternating pattern, 7 inch steps, unilateral HHA, L knee pain, decreased concentric and eccentric strength and motor control bilaterally, increased valgus on R  6/5: 5xSTS: 11.4seconds Stairs: single HHA, improved control though mild offloading noted of R LE   GAIT: Distance walked: 100 feet Assistive device utilized: None Level of assistance: Complete Independence Comments: R truncal lean on R stance, decreased hip extension bilaterally   TODAY'S TREATMENT:                                                                                                                              DATE:  02/20/24 Nustep 5 minutes LE L5 only for dynamic warm up Hamstring curl machine 10# 2 x 10  Leg press 10# 2 x 10 Hip abduction machine 25# 1 x 10, 40# 1 x 10 Hip adduction machine 25# 2 x 10  02/18/24 Nustep 5 minutes LE L5 only for dynamic warm up  DKTC  DKTC with rocking Piriformis stretching 20sec  x3bil PPT 5 x10 Supine marching 2x10 Sidelying hip adduction 2x10ea Step up over airex 2 x 10 bilateral Lateral step up/over airex 2x10 Tandem balance on floor 2x30sec ea HEP- added stretches  02/05/24 Nustep 5 minutes LE L5 only for dynamic warm up  Step up over airex 2 x 10 bilateral Lateral step up/over airex 2x10 Mini squat lateral slide out 2 x 10 Hip hinge 3 x 10  Standing ab set/engagement Mini lunge 2 x 10   01/31/24 Nustep 5 minutes LE L5 only for dynamic warm up  Standing alternating march on airex 2 x 10 Step up over airex 2 x 10 bilateral Lateral step up/over airex 2x10 SLS with 3 way reach on airex 5 x 5 second holds STS 1 x 10 Hip hike 2 x 10  Review of HEP  01/29/24 Nustep 5 minutes LE L5 only for dynamic warm up  Supine SLR 2x10ea S/l abduction 3x10ea Bridges 3x10 LTR x8 STM to L gluteal mm LAQ 3# (trialled on L LE but painful, so stopped) x20 R LE  Step up over airex 2 x 10 bilateral Lateral step up/over airex 2x10 Side stepping along rail with RTB at ankles STS 2x10  01/24/24 Nustep 5 minutes LE L4 only for dynamic warm up  Heel toe rocks on airex  March on airex 2 x 10 Runner step up on airex x10ea Step up over airex 2 x 10 bilateral Hip hikes 4 step 2x10ea (cues for proper performance  Semi tandem on airex with cross body reaches 2x10ea Standing hip abd/ext with RTB at ankles 2x10ea    PATIENT EDUCATION:  Education details: Patient educated on exam findings, POC, scope of PT, HEP, relevant anatomy and pathology. 12/14/23: HEP Person educated: Patient Education method: Explanation, Demonstration, and Handouts Education comprehension: verbalized understanding, returned demonstration, verbal cues required, and tactile cues required  HOME EXERCISE PROGRAM: Access Code: [email or web address] URL: [email or web address] Date: 11/22/2023 Prepared by: Prentice Amayah Staheli  Exercises - Supine Bridge  - 1 x daily - 7 x weekly - 2 sets - 10 reps -  Clamshell  - 1 x daily - 7 x weekly - 2 sets - 10 reps  ASSESSMENT:  CLINICAL IMPRESSION: Began machine strengthening to improve patient's confidence in the gym as she will begin going to gym following upcoming vacation. Began with low resistance to focus on mechanics. Cueing provided for all set up, mechanics. Educated on DOMS. Patient will continue to benefit from physical therapy in order to improve function and reduce impairment.       OBJECTIVE IMPAIRMENTS: Abnormal gait, decreased activity tolerance, decreased balance, decreased endurance, decreased mobility, difficulty walking, decreased ROM, decreased strength, increased muscle spasms, impaired flexibility, improper body mechanics, and pain.   ACTIVITY LIMITATIONS: carrying, lifting, bending, standing, squatting, stairs, transfers, locomotion level, and caring for others  PARTICIPATION LIMITATIONS: meal prep, cleaning, laundry, shopping, community activity, and yard work  PERSONAL FACTORS: Time since onset of injury/illness/exacerbation and 3+ comorbidities: HTN, afib, hx hip and knee pain, hx back pain are also affecting patient's functional outcome.   REHAB POTENTIAL: Good  CLINICAL DECISION MAKING: Evolving/moderate complexity  EVALUATION COMPLEXITY: Moderate   GOALS: Goals reviewed with patient? Yes  SHORT TERM GOALS: Target date: 12/20/2023    Patient will be independent with HEP in order to improve functional outcomes. Baseline: Goal status: MET 6/3  2.  Patient will report at least 25% improvement in symptoms for improved quality of life. Baseline: Goal status: MET 6/3    LONG TERM GOALS:  Target date: 01/17/2024    Patient will report at least 75% improvement in symptoms for improved quality of life. Baseline:  Goal status: IN PROGRESS (50% 6/5)  2.  Patient will improve LEFS score by at least 9 points in order to indicate improved tolerance to activity. Baseline: 31/80 Goal status: MET 6/5  (43/80)  3.  Patient will be able to navigate stairs with reciprocal pattern without compensation in order to demonstrate improved LE strength. Baseline:  Goal status: IN PROGRESS (only mild compensation 6/5)  4.  Patient will demonstrate grade of 5/5 MMT grade in all tested musculature as evidence of improved strength to assist with stair ambulation and gait.  Baseline:  Goal status: IN PROGRESS 6/5  5.  Patient will be able to complete 5x STS in under 15 seconds in order to demo improved functional strength. Baseline:  Goal status: MET 6/5  6.  Patient will be able to perform full gym routine independently.  Baseline:  Goal status: INITIAL (has not yet returned to gym. 6/5)   PLAN:  PT FREQUENCY: 2x/week  PT DURATION: 8 weeks  PLANNED INTERVENTIONS: 97164- PT Re-evaluation, 97110-Therapeutic exercises, 97530- Therapeutic activity, 97112- Neuromuscular re-education, 97535- Self Care, [id number]- Manual therapy, [phone]- Gait training, [phone]- Orthotic Fit/training, [phone]- Canalith repositioning, [id number]- Aquatic Therapy, [phone]- Splinting, Patient/Family education, Balance training, Stair training, Taping, Dry Needling, Joint mobilization, Joint manipulation, Spinal manipulation, Spinal mobilization, Scar mobilization, and DME instructions.  PLAN FOR NEXT SESSION: f/u with HEP, glute and quad strength, functional strength, progress to gym routine as able    Prentice GORMAN Stains, PT 02/20/2024, 2:34 PM

## 2024-02-25 ENCOUNTER — Ambulatory Visit (INDEPENDENT_AMBULATORY_CARE_PROVIDER_SITE_OTHER): Admitting: Obstetrics and Gynecology

## 2024-02-25 ENCOUNTER — Encounter: Payer: Self-pay | Admitting: Obstetrics and Gynecology

## 2024-02-25 ENCOUNTER — Other Ambulatory Visit (HOSPITAL_COMMUNITY)
Admission: RE | Admit: 2024-02-25 | Discharge: 2024-02-25 | Disposition: A | Discharge status: Home / Self Care | Source: Other Acute Inpatient Hospital | Attending: Obstetrics and Gynecology | Admitting: Obstetrics and Gynecology

## 2024-02-25 VITALS — BP 120/75 | HR 99 | Ht 62.7 in | Wt 167.4 lb

## 2024-02-25 DIAGNOSIS — R319 Hematuria, unspecified: Secondary | ICD-10-CM

## 2024-02-25 DIAGNOSIS — N8110 Cystocele, unspecified: Secondary | ICD-10-CM

## 2024-02-25 DIAGNOSIS — N814 Uterovaginal prolapse, unspecified: Secondary | ICD-10-CM

## 2024-02-25 DIAGNOSIS — R350 Frequency of micturition: Secondary | ICD-10-CM | POA: Diagnosis not present

## 2024-02-25 DIAGNOSIS — R82998 Other abnormal findings in urine: Secondary | ICD-10-CM | POA: Insufficient documentation

## 2024-02-25 DIAGNOSIS — N952 Postmenopausal atrophic vaginitis: Secondary | ICD-10-CM | POA: Diagnosis not present

## 2024-02-25 LAB — POCT URINALYSIS DIP (CLINITEK)
Bilirubin, UA: NEGATIVE
Glucose, UA: NEGATIVE mg/dL
Ketones, POC UA: NEGATIVE mg/dL
Nitrite, UA: NEGATIVE
POC PROTEIN,UA: NEGATIVE
Spec Grav, UA: 1.02 (ref 1.010–1.025)
Urobilinogen, UA: 0.2 U/dL
pH, UA: 5.5 (ref 5.0–8.0)

## 2024-02-25 LAB — URINALYSIS, ROUTINE W REFLEX MICROSCOPIC
Bilirubin Urine: NEGATIVE
Glucose, UA: NEGATIVE mg/dL
Ketones, ur: NEGATIVE mg/dL
Nitrite: NEGATIVE
Protein, ur: NEGATIVE mg/dL
Specific Gravity, Urine: 1.02 (ref 1.005–1.030)
pH: 5 (ref 5.0–8.0)

## 2024-02-25 MED ORDER — ESTRADIOL 0.1 MG/GM VA CREA: 0.5000 g | TOPICAL_CREAM | VAGINAL | 11 refills | Status: AC

## 2024-02-25 NOTE — Patient Instructions (Addendum)
 Today we talked about ways to manage bladder urgency such as altering your diet to avoid irritative beverages and foods (bladder diet) as well as attempting to decrease stress and other exacerbating factors.     The Most Bothersome Foods* The Least Bothersome Foods*  Coffee - Regular & Decaf Tea - caffeinated Carbonated beverages - cola, non-colas, diet & caffeine-free Alcohols - Beer, Red Wine, White Wine, [address] - Grapefruit, [place], Orange, [employer] - Cranberry, Grapefruit, Orange, Pineapple Vegetables - Tomato & Tomato Products Flavor Enhancers - Hot peppers, Spicy foods, Chili, Horseradish, Vinegar, Monosodium glutamate (MSG) Artificial Sweeteners - NutraSweet, Sweet 'N Low, Equal (sweetener), Saccharin Ethnic foods - [place], [place], [place] food [employer] - low-fat & whole Fruits - Bananas, Blueberries, Honeydew melon, Pears, Raisins, Watermelon Vegetables - Broccoli, [address] Sprouts, [place], Carrots, Cauliflower, [place], Cucumber, Mushrooms, Peas, Radishes, Squash, Zucchini, White potatoes, Sweet potatoes & yams Poultry - Chicken, Eggs, [place], [employer] - Beef, Diplomatic Services operational officer, Lamb Seafood - Shrimp, [place] fish, Salmon Grains - Oat, Rice Snacks - Pretzels, Popcorn  *Mitch ALF et al. Diet and its role in interstitial cystitis/bladder pain syndrome (IC/BPS) and comorbid conditions. [employer]. BJU Int. 2012 Jan 11.    Constipation: Our goal is to achieve formed bowel movements daily or every-other-day.  You may need to try different combinations of the following options to find what works best for you - everybody's body works differently so feel free to adjust the dosages as needed.  Some options to help maintain bowel health include:  Dietary changes (more leafy greens, vegetables and fruits; less processed foods) Fiber supplementation (Benefiber, FiberCon, Metamucil or Psyllium). Start slow and increase gradually to full dose. Over-the-counter agents such as:  stool softeners (Docusate or Colace) and/or laxatives (Miralax, milk of magnesia)  Power Pudding is a natural mixture that may help your constipation.  To make blend 1 cup applesauce, 1 cup wheat bran, and 3/4 cup prune juice, refrigerate and then take 1 tablespoon daily with a large glass of water as needed.  You have stage 3 anterior vaginal wall prolapse.   You will use the estrogen nightly for 2 weeks and then twice a week after. 3-4

## 2024-02-25 NOTE — Progress Notes (Signed)
 [facility] New Patient Evaluation and Consultation  Referring Provider: Latisha Medford, MD PCP: Geofm Glade PARAS, MD Date of Service: 02/25/2024  SUBJECTIVE Chief Complaint: New Patient (Initial Visit) [name] is a 78 y.o. female is here for prolapse.)  History of Present Illness: [name] is a 78 y.o. White or Caucasian female seen in consultation at the request of Dr. Latisha for evaluation of prolapse.    Review of records significant for: Note on 08/13/23 from Dr. Latisha Pelvic organ prolapse Pt informed that she has pelvic organ prolapse that is mildly symptomatic at this point. We discussed that prolapse in general are not harmful, and while can progress, they may not over time. We discussed that should it become bothersome she could try pessary or even hysterectomy, but for now expectant management seems reasonable. She is concerned about using a pessary b/c of severe arthritis in her hands. referral to urogynecology made. If she is not a candidate for surgery, will consider pessary w/ office mngt  Urinary Symptoms: Does not leak urine.    Day time voids 9-12.  Nocturia: 2-3 times per night to void. Voiding dysfunction:  does not empty bladder well.  Patient does not use a catheter to empty bladder.  When urinating, patient feels a weak stream Drinks: 4 glasses water per day, occasional diet pepsi per day  UTIs: 0 UTI's in the last year.   Reports history of blood in urine No results found for the last 90 days.   Pelvic Organ Prolapse Symptoms:                  Patient Admits to a feeling of a bulge the vaginal area. It has been present for 3 months.  Patient Denies seeing a bulge.  This bulge is bothersome.  Bowel Symptom: Bowel movements: 3-4 time(s) per week Stool consistency: hard Straining: no.  Splinting: no.  Incomplete evacuation: no.  Patient Admits to accidental bowel leakage / fecal incontinence  Occurs:  Occasionally  Consistency with leakage: liquid Bowel regimen: none Last colonoscopy:  [facility] Colonoscopy          Completed or No Longer Recommended     Colonoscopy  Discontinued      Frequency changed to Never automatically (Topic No Longer Applies)   12/06/2021  [facility] Colonoscopy component of [facility] COLONOSCOPY   Only the first 1 history entries have been loaded, but more history exists.                Sexual Function Sexually active: yes.  Sexual orientation: Straight Pain with sex: No  Pelvic Pain Denies pelvic pain    Past Medical History:  Past Medical History:  Diagnosis Date   Arthritis    ARTHRITIS, HIPS, BILATERAL    Atrial fibrillation (HCC)    chronic anticoag - Pradaxa    BURSITIS, SUBTROCHANTERIC    HYPERTENSION    Mixed hyperlipidemia      Past Surgical History:   Past Surgical History:  Procedure Laterality Date   DILATION AND CURETTAGE OF UTERUS     TONSILLECTOMY       Past OB/GYN History: G1P1001 Vaginal deliveries: 1,  Forceps/ Vacuum deliveries: 1, Cesarean section: 0 Menopausal: Yes Contraception: none. Last pap smear was 2024.  Any history of abnormal pap smears: no. [facility] PAP   This patient has no relevant Health Maintenance data.     Medications: Patient has a current medication list which includes the following prescription(s): acetaminophen , calcium carbonate, cholecalciferol, co-enzyme  q-10, diltiazem , diltiazem , duloxetine , estradiol , hydrochlorothiazide , hydrocodone -acetaminophen , klor-con  m10, lisinopril , magnesium oxide, memantine , pravastatin , and rivaroxaban .   Allergies: Patient is allergic to nickel.   Social History:  Social History   Tobacco Use   Smoking status: Former    Types: Cigarettes   Smokeless tobacco: Never   Tobacco comments:    social age 83  Vaping Use   Vaping status: Never Used  Substance Use Topics   Alcohol use: Yes    Comment: Occac   Drug use: No    Relationship status: married Patient  lives with spouse.   Patient is not employed. Regular exercise: Yes: Physical therapy, Gym classes History of abuse: No  Family History:   Family History  Problem Relation Age of Onset   Hypertension Mother    Heart failure Mother    Leukemia Father      Review of Systems: Review of Systems  Constitutional:  Positive for malaise/fatigue. Negative for chills and fever.  Respiratory:  Negative for cough and shortness of breath.   Cardiovascular:  Negative for chest pain and palpitations.  Gastrointestinal:  Negative for abdominal pain, blood in stool, constipation and diarrhea.  Skin:  Negative for rash.  Neurological:  Positive for weakness.  Endo/Heme/Allergies:  Bruises/bleeds easily.  Psychiatric/Behavioral:  Negative for depression and suicidal ideas. The patient is nervous/anxious.      OBJECTIVE Physical Exam: Vitals:   02/25/24 1023  BP: 120/75  Pulse: 99  Weight: 167 lb 6.4 oz (75.9 kg)  Height: 5' 2.7 (1.593 m)    Physical Exam Constitutional:      Appearance: Normal appearance.  Pulmonary:     Effort: Pulmonary effort is normal.  Abdominal:     Palpations: Abdomen is soft.  Neurological:     General: No focal deficit present.     Mental Status: She is alert and oriented to person, place, and time.  Psychiatric:        Mood and Affect: Mood normal.        Behavior: Behavior normal. Behavior is cooperative.        Thought Content: Thought content normal.      GU / Detailed Urogynecologic Evaluation:  Pelvic Exam: Normal external female genitalia; Bartholin's and Skene's glands normal in appearance; urethral meatus normal in appearance, no urethral masses or discharge.   CST: negative  Speculum exam reveals normal vaginal mucosa with atrophy. Cervix normal appearance. Uterus normal single, nontender. Adnexa normal adnexa.     With apex supported, anterior compartment defect was present  Pelvic floor strength I/V  Pelvic floor musculature: Right  levator non-tender, Right obturator non-tender, Left levator non-tender, Left obturator tender  POP-Q:   POP-Q  1                                            Aa   1                                           Ba  -3                                              C  4.5                                            Gh  3                                            Pb  8                                            tvl   -2.5                                            Ap  -2.5                                            Bp  -4                                              D      Rectal Exam:  Normal external exam  Post-Void Residual (PVR) by Bladder Scan: In order to evaluate bladder emptying, we discussed obtaining a postvoid residual and patient agreed to this procedure.  Procedure: The ultrasound unit was placed on the patient's abdomen in the suprapubic region after the patient had voided.    Post Void Residual - 02/25/24 1314       Post Void Residual   Post Void Residual 72 mL           Laboratory Results: Lab Results  Component Value Date   COLORU yellow 02/25/2024   CLARITYU clear 02/25/2024   GLUCOSEUR negative 02/25/2024   BILIRUBINUR negative 02/25/2024   KETONESU neg 10/16/2013   SPECGRAV 1.020 02/25/2024   RBCUR moderate (A) 02/25/2024   PHUR 5.5 02/25/2024   PROTEINUR NEGATIVE 02/09/2024   UROBILINOGEN 0.2 02/25/2024   LEUKOCYTESUR Small (1+) (A) 02/25/2024    Lab Results  Component Value Date   CREATININE 0.97 02/09/2024   CREATININE 0.81 01/23/2024   CREATININE 0.82 07/25/2023    Lab Results  Component Value Date   HGBA1C 5.3 01/23/2024    Lab Results  Component Value Date   HGB 14.2 02/09/2024     ASSESSMENT AND PLAN [name] is a 78 y.o. with:  1. Prolapse of anterior vaginal wall   2. Uterine prolapse   3. Vaginal atrophy   4. Leukocytes in urine   5. Hematuria, unspecified type   6. Urinary frequency    Patient has  stage II/IV Anterior, I/IV Uterine, and I/IV posterior vaginal prolapse. She is not overly bothered by the symptoms except possibly the OAB symptoms associated with her need to double void.  We discussed options both surgical and non-surgical including pessary placement, Sacrocolpopexy, Colpocleisis, and Sacrospinous ligament fixation both with and without hysterectomy. She is unsure what she would like  to consider surgically and is also hesitant about a pessary as she is sexually active. Patient believes she would like to start with a pessary and then see if she can tolerate it.  Will send urine for culture.  Will send urine for micro. Patient has had a full workup including cysto done for hematuria in the past she reports the cysto did not show signs of bladder cancer or lesions in the bladder.  Patient has vaginal atrophy on exam. She would benefit from estrogen cream. Patient to use a blueberry sized amount into the vagina. She may use this nightly for 2 weeks and then twice weekly after. We discussed using her finger instead of using the applicator.   Patient to return for Pessary fitting.    Kristiana Jacko G Kristel Durkee, NP

## 2024-02-26 ENCOUNTER — Encounter (HOSPITAL_BASED_OUTPATIENT_CLINIC_OR_DEPARTMENT_OTHER): Payer: Self-pay

## 2024-02-26 ENCOUNTER — Ambulatory Visit: Payer: Self-pay | Admitting: Obstetrics and Gynecology

## 2024-02-26 ENCOUNTER — Ambulatory Visit (HOSPITAL_BASED_OUTPATIENT_CLINIC_OR_DEPARTMENT_OTHER)

## 2024-02-26 DIAGNOSIS — R29898 Other symptoms and signs involving the musculoskeletal system: Secondary | ICD-10-CM | POA: Diagnosis not present

## 2024-02-26 DIAGNOSIS — R2689 Other abnormalities of gait and mobility: Secondary | ICD-10-CM

## 2024-02-26 DIAGNOSIS — M25562 Pain in left knee: Secondary | ICD-10-CM | POA: Diagnosis not present

## 2024-02-26 DIAGNOSIS — M6281 Muscle weakness (generalized): Secondary | ICD-10-CM

## 2024-02-26 DIAGNOSIS — M25552 Pain in left hip: Secondary | ICD-10-CM | POA: Diagnosis not present

## 2024-02-26 DIAGNOSIS — M25561 Pain in right knee: Secondary | ICD-10-CM | POA: Diagnosis not present

## 2024-02-26 DIAGNOSIS — M25551 Pain in right hip: Secondary | ICD-10-CM

## 2024-02-26 LAB — URINE CULTURE: Culture: <10000 — AB

## 2024-02-26 NOTE — Therapy (Addendum)
 OUTPATIENT PHYSICAL THERAPY TREATMENT Progress Note Reporting Period 01/10/24 to 02/26/24  See note below for Objective Data and Assessment of Progress/Goals.          Patient Name: [name] MRN: [id number] DOB:[birth year], 78 y.o., female Today's Date: 02/26/2024  END OF SESSION:  PT End of Session - 02/26/24 1057     Visit Number 20    Number of Visits 32    Date for PT Re-Evaluation 03/11/24    Authorization Type MEDICARE    Progress Note Due on Visit 20    PT Start Time 1056    PT Stop Time 1142    PT Time Calculation (min) 46 min    Activity Tolerance Patient tolerated treatment well    Behavior During Therapy WFL for tasks assessed/performed                      Past Medical History:  Diagnosis Date   Arthritis    ARTHRITIS, HIPS, BILATERAL    Atrial fibrillation (HCC)    chronic anticoag - Pradaxa    BURSITIS, SUBTROCHANTERIC    HYPERTENSION    Mixed hyperlipidemia    Past Surgical History:  Procedure Laterality Date   DILATION AND CURETTAGE OF UTERUS     TONSILLECTOMY     Patient Active Problem List   Diagnosis Date Noted   Wrist pain, acute, right 11/30/2023   Thyroid  nodule 11/29/2023   Fall 11/29/2023   Anxiety 10/11/2023   Memory impairment 10/03/2023   Intermittent right-sided chest pain 07/25/2023   Pruritus - legs 01/22/2023   Vitamin D  deficiency 01/22/2022   Secondary hypercoagulable state (HCC) 11/17/2020   B12 deficiency 10/28/2019   IBS (irritable bowel syndrome) 10/28/2019   Arthritis of right hand 07/22/2019   Presbycusis of both ears 05/14/2018   Prediabetes 05/15/2017   Pain of right heel 01/09/2017   Primary osteoarthritis of both feet 07/10/2016   Primary osteoarthritis of both hands 07/10/2016   Alopecia totalis 06/05/2016   Dermatochalasis of both upper eyelids 10/12/2015   H/O dysplastic nevus 08/03/2015   Degenerative lumbar disc 12/04/2013   Hematuria, undiagnosed cause 09/02/2013   Osteopenia  06/11/2013   Primary generalized (osteo)arthritis 12/18/2011   Mixed hyperlipidemia 03/25/2009   Essential hypertension 03/24/2009   ATRIAL FIBRILLATION 03/24/2009   Greater trochanteric bursitis of left hip 02/12/2007    PCP: Geofm Glade JINNY, MD  REFERRING PROVIDER: Marquette Ozell BIRCH, DO  REFERRING DIAG: [phone] (ICD-10-CM) - Pain in left hip R26.2 (ICD-10-CM) - Difficulty in walking, not elsewhere classified Bilateral OA of hip, bilateral OA of knee, pain in R hip, pain in L hip  THERAPY DIAG:  Other symptoms and signs involving the musculoskeletal system  Muscle weakness (generalized)  Pain in both knees, unspecified chronicity  Bilateral hip pain  Other abnormalities of gait and mobility  Rationale for Evaluation and Treatment: Rehabilitation  ONSET DATE: chronic   SUBJECTIVE:   SUBJECTIVE STATEMENT:  Pt reports she did well last session. No pain at entry.   EVAL: Patient states she just joined the gym a few weeks ago. Then had a problem with her hip. Ruptured disc in back twice. Patient states hip pain for a long time. Shots helped a little in R. R hip is worse than the left. On way to [place]  she had a pain in R glute where she had to go to ER. L hip has some pain as well. Symptoms are worse with prolonged walking. Wants to able to workout  again, eager to get stronger. Had weak abductors in the past. Legs are very weak and has trouble with stairs. L knee bothers her and troubles with squats and lunges.  PERTINENT HISTORY: hypertension, bilateral hip arthritis, atrial fibrillation  PAIN:  Are you having pain? No at rest  PRECAUTIONS: None  WEIGHT BEARING RESTRICTIONS: No  FALLS:  Has patient fallen in last 6 months? No  PLOF: Independent  PATIENT GOALS: get stronger, exercise   OBJECTIVE: (objective measures from initial evaluation unless otherwise dated)  PATIENT SURVEYS:  LEFS 31/80            6/5: 43/80 7/22:52/80 COGNITION: Overall cognitive status:  Within functional limits for tasks assessed     SENSATION: WFL  POSTURE: No Significant postural limitations  PALPATION: Grossly tender bilateral glutes   LOWER EXTREMITY MMT:  MMT Right eval Left eval Right 6/5 Left 6/5 Right 7/22 Left 7/22  Hip flexion 4 4- 4 4- 4 4  Hip extension 3 3 3+ 4- 4 4  Hip abduction 3- 3 3+ 4+ 4- 4  Hip adduction        Hip internal rotation        Hip external rotation        Knee flexion 4+ 4+ 5 5 5 5   Knee extension 4+ 4+ 4+ 5 4+ 4+  Ankle dorsiflexion        Ankle plantarflexion        Ankle inversion        Ankle eversion         (Blank rows = not tested) *= pain/symptoms    FUNCTIONAL TESTS:  5 times sit to stand: 31.35 seconds with hands on thighs, labored, relies L>RLE, R valgus Stairs: alternating pattern, 7 inch steps, unilateral HHA, L knee pain, decreased concentric and eccentric strength and motor control bilaterally, increased valgus on R  6/5: 5xSTS: 11.4seconds Stairs: single HHA, improved control though mild offloading noted of R LE   GAIT: Distance walked: 100 feet Assistive device utilized: None Level of assistance: Complete Independence Comments: R truncal lean on R stance, decreased hip extension bilaterally   TODAY'S TREATMENT:                                                                                                                              DATE:   02/26/24 Nustep 5 minutes LE L5 only for dynamic warm up LEFS 52/80 Updated ROM and strength Updated goals Stair climbing 1/2 flight x1 Sit to stands 2x10  Hamstring curl machine 10# 2 x 10  Leg press 10# 2 x 10 Hip abduction machine 25# 1 x 10, 40# 1 x 10 Hip adduction machine 25# 2 x 10  02/20/24 Nustep 5 minutes LE L5 only for dynamic warm up Hamstring curl machine 10# 2 x 10  Leg press 10# 2 x 10 Hip abduction machine 25# 1 x 10, 40# 1 x 10 Hip adduction machine 25# 2 x  10  02/18/24 Nustep 5 minutes LE L5 only for dynamic warm up  DKTC   DKTC with rocking Piriformis stretching 20sec x3bil PPT 5 x10 Supine marching 2x10 Sidelying hip adduction 2x10ea Step up over airex 2 x 10 bilateral Lateral step up/over airex 2x10 Tandem balance on floor 2x30sec ea HEP- added stretches  02/05/24 Nustep 5 minutes LE L5 only for dynamic warm up  Step up over airex 2 x 10 bilateral Lateral step up/over airex 2x10 Mini squat lateral slide out 2 x 10 Hip hinge 3 x 10  Standing ab set/engagement Mini lunge 2 x 10   01/31/24 Nustep 5 minutes LE L5 only for dynamic warm up  Standing alternating march on airex 2 x 10 Step up over airex 2 x 10 bilateral Lateral step up/over airex 2x10 SLS with 3 way reach on airex 5 x 5 second holds STS 1 x 10 Hip hike 2 x 10  Review of HEP  01/29/24 Nustep 5 minutes LE L5 only for dynamic warm up  Supine SLR 2x10ea S/l abduction 3x10ea Bridges 3x10 LTR x8 STM to L gluteal mm LAQ 3# (trialled on L LE but painful, so stopped) x20 R LE  Step up over airex 2 x 10 bilateral Lateral step up/over airex 2x10 Side stepping along rail with RTB at ankles STS 2x10  01/24/24 Nustep 5 minutes LE L4 only for dynamic warm up  Heel toe rocks on airex  March on airex 2 x 10 Runner step up on airex x10ea Step up over airex 2 x 10 bilateral Hip hikes 4 step 2x10ea (cues for proper performance  Semi tandem on airex with cross body reaches 2x10ea Standing hip abd/ext with RTB at ankles 2x10ea    PATIENT EDUCATION:  Education details: Patient educated on exam findings, POC, scope of PT, HEP, relevant anatomy and pathology. 12/14/23: HEP Person educated: Patient Education method: Explanation, Demonstration, and Handouts Education comprehension: verbalized understanding, returned demonstration, verbal cues required, and tactile cues required  HOME EXERCISE PROGRAM: Access Code: [email or web address] URL: [email or web address] Date: 11/22/2023 Prepared by: Prentice Zaunegger  Exercises - Supine Bridge   - 1 x daily - 7 x weekly - 2 sets - 10 reps - Clamshell  - 1 x daily - 7 x weekly - 2 sets - 10 reps  ASSESSMENT:  CLINICAL IMPRESSION: Pt has attended 20 visits of PT thus far. Pt reports 80% improvement since starting PT. LEFS score updated to 52/80. Pt has met 2/2 STG and LTG. MMT has not yet reached goal level of 5/5. She remains challenged with hip abduction and extension strength bilaterally. Able to tolerate LE machine strengthening as well as sit to stands. Working towards independent gym program. Pt has made excellent progress thus far. Plan is to d/c to HEP in 2 visits as pt will be leaving for an extended vacation.    Patient has met 2/2 short term goals and 4/6 long term goals with ability to complete HEP and improvement in symptoms, activity tolerance, and functional mobility. Remaining goals not met due to continued deficits in strength, symptoms, and full confidence/ability to return to gym. She continues to make good progress in strength and activity tolerance as demonstrated by LEFS improvement. Patient is overall making good progress toward remaining goals. Patient will continue to benefit from skilled physical therapy in order to improve function and reduce impairment.  7:33 AM, 03/06/24 Prentice [name] PT, DPT Physical Therapist at [facility]'   OBJECTIVE IMPAIRMENTS: Abnormal gait, decreased activity tolerance,  decreased balance, decreased endurance, decreased mobility, difficulty walking, decreased ROM, decreased strength, increased muscle spasms, impaired flexibility, improper body mechanics, and pain.   ACTIVITY LIMITATIONS: carrying, lifting, bending, standing, squatting, stairs, transfers, locomotion level, and caring for others  PARTICIPATION LIMITATIONS: meal prep, cleaning, laundry, shopping, community activity, and yard work  PERSONAL FACTORS: Time since onset of injury/illness/exacerbation and 3+ comorbidities: HTN, afib, hx hip and knee pain, hx back pain are  also affecting patient's functional outcome.   REHAB POTENTIAL: Good  CLINICAL DECISION MAKING: Evolving/moderate complexity  EVALUATION COMPLEXITY: Moderate   GOALS: Goals reviewed with patient? Yes  SHORT TERM GOALS: Target date: 12/20/2023    Patient will be independent with HEP in order to improve functional outcomes. Baseline: Goal status: MET 6/3  2.  Patient will report at least 25% improvement in symptoms for improved quality of life. Baseline: Goal status: MET 6/3    LONG TERM GOALS: Target date: 01/17/2024    Patient will report at least 75% improvement in symptoms for improved quality of life. Baseline:  Goal status: MET 7/22  2.  Patient will improve LEFS score by at least 9 points in order to indicate improved tolerance to activity. Baseline: 31/80 Goal status: MET 6/5 (43/80)  3.  Patient will be able to navigate stairs with reciprocal pattern without compensation in order to demonstrate improved LE strength. Baseline:  Goal status: MET 7/22  4.  Patient will demonstrate grade of 5/5 MMT grade in all tested musculature as evidence of improved strength to assist with stair ambulation and gait.  Baseline:  Goal status: IN PROGRESS 7/22  5.  Patient will be able to complete 5x STS in under 15 seconds in order to demo improved functional strength. Baseline:  Goal status: MET 6/5  6.  Patient will be able to perform full gym routine independently.  Baseline:  Goal status: IN PROGRESS (has not yet returned to gym, but able to perform gym machines in clinic. 7/22)   PLAN:  PT FREQUENCY: 2x/week  PT DURATION: 8 weeks  PLANNED INTERVENTIONS: 97164- PT Re-evaluation, 97110-Therapeutic exercises, 97530- Therapeutic activity, 97112- Neuromuscular re-education, 97535- Self Care, 02859- Manual therapy, [phone]- Gait training, [phone]- Orthotic Fit/training, [phone]- Canalith repositioning, [id number]- Aquatic Therapy, [phone]- Splinting, Patient/Family education, Balance  training, Stair training, Taping, Dry Needling, Joint mobilization, Joint manipulation, Spinal manipulation, Spinal mobilization, Scar mobilization, and DME instructions.  PLAN FOR NEXT SESSION: f/u with HEP, glute and quad strength, functional strength, progress to gym routine as able    Asberry FORBES Rodes, PTA 02/26/2024, 2:22 PM

## 2024-03-04 ENCOUNTER — Encounter (HOSPITAL_BASED_OUTPATIENT_CLINIC_OR_DEPARTMENT_OTHER): Payer: Self-pay

## 2024-03-04 ENCOUNTER — Ambulatory Visit (HOSPITAL_BASED_OUTPATIENT_CLINIC_OR_DEPARTMENT_OTHER)

## 2024-03-04 DIAGNOSIS — M25561 Pain in right knee: Secondary | ICD-10-CM | POA: Diagnosis not present

## 2024-03-04 DIAGNOSIS — R2689 Other abnormalities of gait and mobility: Secondary | ICD-10-CM

## 2024-03-04 DIAGNOSIS — R29898 Other symptoms and signs involving the musculoskeletal system: Secondary | ICD-10-CM | POA: Diagnosis not present

## 2024-03-04 DIAGNOSIS — M25552 Pain in left hip: Secondary | ICD-10-CM | POA: Diagnosis not present

## 2024-03-04 DIAGNOSIS — M25562 Pain in left knee: Secondary | ICD-10-CM | POA: Diagnosis not present

## 2024-03-04 DIAGNOSIS — M25551 Pain in right hip: Secondary | ICD-10-CM

## 2024-03-04 DIAGNOSIS — M6281 Muscle weakness (generalized): Secondary | ICD-10-CM

## 2024-03-04 NOTE — Therapy (Signed)
 OUTPATIENT PHYSICAL THERAPY TREATMENT    Patient Name: [name] MRN: [id number] DOB:Jul 24, 1946, 78 y.o., female Today's Date: 03/04/2024  END OF SESSION:  PT End of Session - 03/04/24 1458     Visit Number 21    Number of Visits 32    Date for PT Re-Evaluation 03/11/24    Authorization Type MEDICARE    Progress Note Due on Visit 30    PT Start Time 1430    PT Stop Time 1515    PT Time Calculation (min) 45 min    Activity Tolerance Patient tolerated treatment well    Behavior During Therapy WFL for tasks assessed/performed                       Past Medical History:  Diagnosis Date   Arthritis    ARTHRITIS, HIPS, BILATERAL    Atrial fibrillation (HCC)    chronic anticoag - Pradaxa    BURSITIS, SUBTROCHANTERIC    HYPERTENSION    Mixed hyperlipidemia    Past Surgical History:  Procedure Laterality Date   DILATION AND CURETTAGE OF UTERUS     TONSILLECTOMY     Patient Active Problem List   Diagnosis Date Noted   Wrist pain, acute, right 11/30/2023   Thyroid  nodule 11/29/2023   Fall 11/29/2023   Anxiety 10/11/2023   Memory impairment 10/03/2023   Intermittent right-sided chest pain 07/25/2023   Pruritus - legs 01/22/2023   Vitamin D  deficiency 01/22/2022   Secondary hypercoagulable state (HCC) 11/17/2020   B12 deficiency 10/28/2019   IBS (irritable bowel syndrome) 10/28/2019   Arthritis of right hand 07/22/2019   Presbycusis of both ears 05/14/2018   Prediabetes 05/15/2017   Pain of right heel 01/09/2017   Primary osteoarthritis of both feet 07/10/2016   Primary osteoarthritis of both hands 07/10/2016   Alopecia totalis 06/05/2016   Dermatochalasis of both upper eyelids 10/12/2015   H/O dysplastic nevus 08/03/2015   Degenerative lumbar disc 12/04/2013   Hematuria, undiagnosed cause 09/02/2013   Osteopenia 06/11/2013   Primary generalized (osteo)arthritis 12/18/2011   Mixed hyperlipidemia 03/25/2009   Essential hypertension 03/24/2009    ATRIAL FIBRILLATION 03/24/2009   Greater trochanteric bursitis of left hip 02/12/2007    PCP: Geofm Glade JINNY, MD  REFERRING PROVIDER: Marquette Ozell BIRCH, DO  REFERRING DIAG: [phone] (ICD-10-CM) - Pain in left hip R26.2 (ICD-10-CM) - Difficulty in walking, not elsewhere classified Bilateral OA of hip, bilateral OA of knee, pain in R hip, pain in L hip  THERAPY DIAG:  Other symptoms and signs involving the musculoskeletal system  Muscle weakness (generalized)  Pain in both knees, unspecified chronicity  Bilateral hip pain  Other abnormalities of gait and mobility  Rationale for Evaluation and Treatment: Rehabilitation  ONSET DATE: chronic   SUBJECTIVE:   SUBJECTIVE STATEMENT: No c/o pain at entry. Hips feel a little sore after walking dog up hill in neighborhood.   EVAL: Patient states she just joined the gym a few weeks ago. Then had a problem with her hip. Ruptured disc in back twice. Patient states hip pain for a long time. Shots helped a little in R. R hip is worse than the left. On way to [place]  she had a pain in R glute where she had to go to ER. L hip has some pain as well. Symptoms are worse with prolonged walking. Wants to able to workout again, eager to get stronger. Had weak abductors in the past. Legs are very weak and has trouble  with stairs. L knee bothers her and troubles with squats and lunges.  PERTINENT HISTORY: hypertension, bilateral hip arthritis, atrial fibrillation  PAIN:  Are you having pain? No at rest  PRECAUTIONS: None  WEIGHT BEARING RESTRICTIONS: No  FALLS:  Has patient fallen in last 6 months? No  PLOF: Independent  PATIENT GOALS: get stronger, exercise   OBJECTIVE: (objective measures from initial evaluation unless otherwise dated)  PATIENT SURVEYS:  LEFS 31/80            6/5: 43/80 7/22:52/80 COGNITION: Overall cognitive status: Within functional limits for tasks assessed     SENSATION: WFL  POSTURE: No Significant postural  limitations  PALPATION: Grossly tender bilateral glutes   LOWER EXTREMITY MMT:  MMT Right eval Left eval Right 6/5 Left 6/5 Right 7/22 Left 7/22  Hip flexion 4 4- 4 4- 4 4  Hip extension 3 3 3+ 4- 4 4  Hip abduction 3- 3 3+ 4+ 4- 4  Hip adduction        Hip internal rotation        Hip external rotation        Knee flexion 4+ 4+ 5 5 5 5   Knee extension 4+ 4+ 4+ 5 4+ 4+  Ankle dorsiflexion        Ankle plantarflexion        Ankle inversion        Ankle eversion         (Blank rows = not tested) *= pain/symptoms    FUNCTIONAL TESTS:  5 times sit to stand: 31.35 seconds with hands on thighs, labored, relies L>RLE, R valgus Stairs: alternating pattern, 7 inch steps, unilateral HHA, L knee pain, decreased concentric and eccentric strength and motor control bilaterally, increased valgus on R  6/5: 5xSTS: 11.4seconds Stairs: single HHA, improved control though mild offloading noted of R LE   GAIT: Distance walked: 100 feet Assistive device utilized: None Level of assistance: Complete Independence Comments: R truncal lean on R stance, decreased hip extension bilaterally   TODAY'S TREATMENT:                                                                                                                              DATE:    03/04/24 Nustep 5 minutes LE L5 only for dynamic warm up Stair climbing 1/2 flight x1 Sit to stands 2x10  Hamstring curl machine 10# 2 x 10  Leg press 10# 2 x 10 Hip abduction machine 25# 1 x 10, 40# 1 x 10 Hip adduction machine 25# 2 x 10 DKTC 30sec x3 Piriformis stretch 30sec x3ea  02/26/24 Nustep 5 minutes LE L5 only for dynamic warm up LEFS 52/80 Updated ROM and strength Updated goals Stair climbing 1/2 flight x1 Sit to stands 2x10  Hamstring curl machine 10# 2 x 10  Leg press 10# 2 x 10 Hip abduction machine 25# 1 x 10, 40# 1 x 10 Hip adduction machine 25# 2 x  10  02/20/24 Nustep 5 minutes LE L5 only for dynamic warm up Hamstring  curl machine 15# x10, 25# x10 Leg press 10# 2 x 10 Hip abduction machine 40# 2 x 10 Hip adduction machine 40# 2 x 10  02/18/24 Nustep 5 minutes LE L5 only for dynamic warm up  DKTC  DKTC with rocking Piriformis stretching 20sec x3bil PPT 5 x10 Supine marching 2x10 Sidelying hip adduction 2x10ea Step up over airex 2 x 10 bilateral Lateral step up/over airex 2x10 Tandem balance on floor 2x30sec ea HEP- added stretches  02/05/24 Nustep 5 minutes LE L5 only for dynamic warm up  Step up over airex 2 x 10 bilateral Lateral step up/over airex 2x10 Mini squat lateral slide out 2 x 10 Hip hinge 3 x 10  Standing ab set/engagement Mini lunge 2 x 10   01/31/24 Nustep 5 minutes LE L5 only for dynamic warm up  Standing alternating march on airex 2 x 10 Step up over airex 2 x 10 bilateral Lateral step up/over airex 2x10 SLS with 3 way reach on airex 5 x 5 second holds STS 1 x 10 Hip hike 2 x 10  Review of HEP  01/29/24 Nustep 5 minutes LE L5 only for dynamic warm up  Supine SLR 2x10ea S/l abduction 3x10ea Bridges 3x10 LTR x8 STM to L gluteal mm LAQ 3# (trialled on L LE but painful, so stopped) x20 R LE  Step up over airex 2 x 10 bilateral Lateral step up/over airex 2x10 Side stepping along rail with RTB at ankles STS 2x10   PATIENT EDUCATION:  Education details: Patient educated on exam findings, POC, scope of PT, HEP, relevant anatomy and pathology. 12/14/23: HEP Person educated: Patient Education method: Explanation, Demonstration, and Handouts Education comprehension: verbalized understanding, returned demonstration, verbal cues required, and tactile cues required  HOME EXERCISE PROGRAM: Access Code: [email or web address] URL: [email or web address] Date: 11/22/2023 Prepared by: Prentice Zaunegger  Exercises - Supine Bridge  - 1 x daily - 7 x weekly - 2 sets - 10 reps - Clamshell  - 1 x daily - 7 x weekly - 2 sets - 10 reps  ASSESSMENT:  CLINICAL IMPRESSION: L knee  continues to bother pt when descending stairs with reciprocal pattern. Worked again on gym machine strengthening to help build independent program. No difficulty reported with sit to stands. Continued with stretching for bil hips to address tightness reported here. Plan to d/c next visit as pt will be leaving to go out of town for extended period of time.        OBJECTIVE IMPAIRMENTS: Abnormal gait, decreased activity tolerance, decreased balance, decreased endurance, decreased mobility, difficulty walking, decreased ROM, decreased strength, increased muscle spasms, impaired flexibility, improper body mechanics, and pain.   ACTIVITY LIMITATIONS: carrying, lifting, bending, standing, squatting, stairs, transfers, locomotion level, and caring for others  PARTICIPATION LIMITATIONS: meal prep, cleaning, laundry, shopping, community activity, and yard work  PERSONAL FACTORS: Time since onset of injury/illness/exacerbation and 3+ comorbidities: HTN, afib, hx hip and knee pain, hx back pain are also affecting patient's functional outcome.   REHAB POTENTIAL: Good  CLINICAL DECISION MAKING: Evolving/moderate complexity  EVALUATION COMPLEXITY: Moderate   GOALS: Goals reviewed with patient? Yes  SHORT TERM GOALS: Target date: 12/20/2023    Patient will be independent with HEP in order to improve functional outcomes. Baseline: Goal status: MET 6/3  2.  Patient will report at least 25% improvement in symptoms for improved quality of life. Baseline: Goal status: MET 6/3  LONG TERM GOALS: Target date: 01/17/2024    Patient will report at least 75% improvement in symptoms for improved quality of life. Baseline:  Goal status: MET 7/22  2.  Patient will improve LEFS score by at least 9 points in order to indicate improved tolerance to activity. Baseline: 31/80 Goal status: MET 6/5 (43/80)  3.  Patient will be able to navigate stairs with reciprocal pattern without compensation in order  to demonstrate improved LE strength. Baseline:  Goal status: MET 7/22  4.  Patient will demonstrate grade of 5/5 MMT grade in all tested musculature as evidence of improved strength to assist with stair ambulation and gait.  Baseline:  Goal status: IN PROGRESS 7/22  5.  Patient will be able to complete 5x STS in under 15 seconds in order to demo improved functional strength. Baseline:  Goal status: MET 6/5  6.  Patient will be able to perform full gym routine independently.  Baseline:  Goal status: IN PROGRESS (has not yet returned to gym, but able to perform gym machines in clinic. 7/22)   PLAN:  PT FREQUENCY: 2x/week  PT DURATION: 8 weeks  PLANNED INTERVENTIONS: 97164- PT Re-evaluation, 97110-Therapeutic exercises, 97530- Therapeutic activity, 97112- Neuromuscular re-education, 97535- Self Care, 02859- Manual therapy, [phone]- Gait training, [phone]- Orthotic Fit/training, [phone]- Canalith repositioning, [id number]- Aquatic Therapy, [phone]- Splinting, Patient/Family education, Balance training, Stair training, Taping, Dry Needling, Joint mobilization, Joint manipulation, Spinal manipulation, Spinal mobilization, Scar mobilization, and DME instructions.  PLAN FOR NEXT SESSION: f/u with HEP, glute and quad strength, functional strength, progress to gym routine as able    Asberry FORBES Rodes, PTA 03/04/2024, 5:03 PM

## 2024-03-06 ENCOUNTER — Encounter (HOSPITAL_BASED_OUTPATIENT_CLINIC_OR_DEPARTMENT_OTHER): Payer: Self-pay

## 2024-03-06 ENCOUNTER — Ambulatory Visit (HOSPITAL_BASED_OUTPATIENT_CLINIC_OR_DEPARTMENT_OTHER)

## 2024-03-06 DIAGNOSIS — M6281 Muscle weakness (generalized): Secondary | ICD-10-CM

## 2024-03-06 DIAGNOSIS — M25562 Pain in left knee: Secondary | ICD-10-CM | POA: Diagnosis not present

## 2024-03-06 DIAGNOSIS — M25561 Pain in right knee: Secondary | ICD-10-CM | POA: Diagnosis not present

## 2024-03-06 DIAGNOSIS — M25551 Pain in right hip: Secondary | ICD-10-CM | POA: Diagnosis not present

## 2024-03-06 DIAGNOSIS — R29898 Other symptoms and signs involving the musculoskeletal system: Secondary | ICD-10-CM | POA: Diagnosis not present

## 2024-03-06 DIAGNOSIS — M25552 Pain in left hip: Secondary | ICD-10-CM | POA: Diagnosis not present

## 2024-03-06 DIAGNOSIS — R2689 Other abnormalities of gait and mobility: Secondary | ICD-10-CM

## 2024-03-06 NOTE — Therapy (Addendum)
 OUTPATIENT PHYSICAL THERAPY DISCHARGE    Patient Name: [name] MRN: [id number] DOB:January 21, 1946, 78 y.o., female Today's Date: 03/06/2024 PHYSICAL THERAPY DISCHARGE SUMMARY  Visits from Start of Care: 22  Current functional level related to goals / functional outcomes: Patient has met 2/2 short term goals and 4/6 long term goals with ability to complete HEP and improvement in symptoms, activity tolerance, and functional mobility.    Remaining deficits: Remaining goals not met due to continued deficits in strength, symptoms, and full confidence/ability to return to gym. She has not yet returned to gym, but able to perform gym machines in clinic.   Education / Equipment: HEP   Patient agrees to discharge. Patient goals were partially met. Patient is being discharged due to transition to HEP and going on extended vacation. 8:47 AM, 03/08/24 [name] PT, DPT Physical Therapist at [facility]   END OF SESSION:  PT End of Session - 03/06/24 1023     Visit Number 22    Number of Visits 32    Date for PT Re-Evaluation 03/11/24    Authorization Type MEDICARE    Progress Note Due on Visit 30    PT Start Time 1019    PT Stop Time 1100    PT Time Calculation (min) 41 min    Activity Tolerance Patient tolerated treatment well    Behavior During Therapy WFL for tasks assessed/performed                        Past Medical History:  Diagnosis Date   Arthritis    ARTHRITIS, HIPS, BILATERAL    Atrial fibrillation (HCC)    chronic anticoag - Pradaxa    BURSITIS, SUBTROCHANTERIC    HYPERTENSION    Mixed hyperlipidemia    Past Surgical History:  Procedure Laterality Date   DILATION AND CURETTAGE OF UTERUS     TONSILLECTOMY     Patient Active Problem List   Diagnosis Date Noted   Wrist pain, acute, right 11/30/2023   Thyroid  nodule 11/29/2023   Fall 11/29/2023   Anxiety 10/11/2023   Memory impairment 10/03/2023   Intermittent right-sided  chest pain 07/25/2023   Pruritus - legs 01/22/2023   Vitamin D  deficiency 01/22/2022   Secondary hypercoagulable state (HCC) 11/17/2020   B12 deficiency 10/28/2019   IBS (irritable bowel syndrome) 10/28/2019   Arthritis of right hand 07/22/2019   Presbycusis of both ears 05/14/2018   Prediabetes 05/15/2017   Pain of right heel 01/09/2017   Primary osteoarthritis of both feet 07/10/2016   Primary osteoarthritis of both hands 07/10/2016   Alopecia totalis 06/05/2016   Dermatochalasis of both upper eyelids 10/12/2015   H/O dysplastic nevus 08/03/2015   Degenerative lumbar disc 12/04/2013   Hematuria, undiagnosed cause 09/02/2013   Osteopenia 06/11/2013   Primary generalized (osteo)arthritis 12/18/2011   Mixed hyperlipidemia 03/25/2009   Essential hypertension 03/24/2009   ATRIAL FIBRILLATION 03/24/2009   Greater trochanteric bursitis of left hip 02/12/2007    PCP: Geofm Glade JINNY, MD  REFERRING PROVIDER: Marquette Ozell BIRCH, DO  REFERRING DIAG: [phone] (ICD-10-CM) - Pain in left hip R26.2 (ICD-10-CM) - Difficulty in walking, not elsewhere classified Bilateral OA of hip, bilateral OA of knee, pain in R hip, pain in L hip  THERAPY DIAG:  Other symptoms and signs involving the musculoskeletal system  Muscle weakness (generalized)  Pain in both knees, unspecified chronicity  Bilateral hip pain  Other abnormalities of gait and mobility  Rationale for Evaluation and  Treatment: Rehabilitation  ONSET DATE: chronic   SUBJECTIVE:   SUBJECTIVE STATEMENT: No complaints at entry.   EVAL: Patient states she just joined the gym a few weeks ago. Then had a problem with her hip. Ruptured disc in back twice. Patient states hip pain for a long time. Shots helped a little in R. R hip is worse than the left. On way to [place]  she had a pain in R glute where she had to go to ER. L hip has some pain as well. Symptoms are worse with prolonged walking. Wants to able to workout again, eager to get  stronger. Had weak abductors in the past. Legs are very weak and has trouble with stairs. L knee bothers her and troubles with squats and lunges.  PERTINENT HISTORY: hypertension, bilateral hip arthritis, atrial fibrillation  PAIN:  Are you having pain? No at rest  PRECAUTIONS: None  WEIGHT BEARING RESTRICTIONS: No  FALLS:  Has patient fallen in last 6 months? No  PLOF: Independent  PATIENT GOALS: get stronger, exercise   OBJECTIVE: (objective measures from initial evaluation unless otherwise dated)  PATIENT SURVEYS:  LEFS 31/80            6/5: 43/80 7/22:52/80 COGNITION: Overall cognitive status: Within functional limits for tasks assessed     SENSATION: WFL  POSTURE: No Significant postural limitations  PALPATION: Grossly tender bilateral glutes   LOWER EXTREMITY MMT:  MMT Right eval Left eval Right 6/5 Left 6/5 Right 7/22 Left 7/22 Right  7/31 Left 7/31  Hip flexion 4 4- 4 4- 4 4 4+ 4  Hip extension 3 3 3+ 4- 4 4 4  4-  Hip abduction 3- 3 3+ 4+ 4- 4 4 4   Hip adduction          Hip internal rotation          Hip external rotation          Knee flexion 4+ 4+ 5 5 5 5 5 5   Knee extension 4+ 4+ 4+ 5 4+ 4+ 5 4+  Ankle dorsiflexion          Ankle plantarflexion          Ankle inversion          Ankle eversion           (Blank rows = not tested) *= pain/symptoms    FUNCTIONAL TESTS:  5 times sit to stand: 31.35 seconds with hands on thighs, labored, relies L>RLE, R valgus Stairs: alternating pattern, 7 inch steps, unilateral HHA, L knee pain, decreased concentric and eccentric strength and motor control bilaterally, increased valgus on R  6/5: 5xSTS: 11.4seconds Stairs: single HHA, improved control though mild offloading noted of R LE 7/31: 10.48 5xSTS  Stairs: minimal UE support. Reciprocal pattern both up/down. 4/10 L knee pain with stair descent.    GAIT: Distance walked: 100 feet Assistive device utilized: None Level of assistance: Complete  Independence Comments: R truncal lean on R stance, decreased hip extension bilaterally   TODAY'S TREATMENT:  DATE:   03/06/24 Nustep 5 minutes LE L5 only for dynamic warm up MMT 5xSTS Stair climbing 1/2 flight x1 Sit to stands 2x10  Hamstring curl machine 10# 2 x 10  Leg extension machine 10# x10 (challenging) Leg press 10# 2 x 10 Hip abduction machine 25# 1 x 10, 40# 1 x 10 Hip adduction machine 40# 2 x 10 DKTC 30sec x3 Piriformis stretch 30sec x3ea  03/04/24 Nustep 5 minutes LE L5 only for dynamic warm up Stair climbing 1/2 flight x1 Sit to stands 2x10  Hamstring curl machine 10# 2 x 10  Leg press 10# 2 x 10 Hip abduction machine 25# 1 x 10, 40# 1 x 10 Hip adduction machine 25# 2 x 10 DKTC 30sec x3 Piriformis stretch 30sec x3ea  02/26/24 Nustep 5 minutes LE L5 only for dynamic warm up LEFS 52/80 Updated ROM and strength Updated goals Stair climbing 1/2 flight x1 Sit to stands 2x10  Hamstring curl machine 10# 2 x 10  Leg press 10# 2 x 10 Hip abduction machine 25# 1 x 10, 40# 1 x 10 Hip adduction machine 25# 2 x 10  02/20/24 Nustep 5 minutes LE L5 only for dynamic warm up Hamstring curl machine 15# x10, 25# x10 Leg press 10# 2 x 10 Hip abduction machine 40# 2 x 10 Hip adduction machine 40# 2 x 10  02/18/24 Nustep 5 minutes LE L5 only for dynamic warm up  DKTC  DKTC with rocking Piriformis stretching 20sec x3bil PPT 5 x10 Supine marching 2x10 Sidelying hip adduction 2x10ea Step up over airex 2 x 10 bilateral Lateral step up/over airex 2x10 Tandem balance on floor 2x30sec ea HEP- added stretches  02/05/24 Nustep 5 minutes LE L5 only for dynamic warm up  Step up over airex 2 x 10 bilateral Lateral step up/over airex 2x10 Mini squat lateral slide out 2 x 10 Hip hinge 3 x 10  Standing ab set/engagement Mini lunge 2 x 10     PATIENT EDUCATION:  Education details: Patient educated on exam findings, POC, scope of PT, HEP, relevant anatomy and pathology. 12/14/23: HEP Person educated: Patient Education method: Explanation, Demonstration, and Handouts Education comprehension: verbalized understanding, returned demonstration, verbal cues required, and tactile cues required  HOME EXERCISE PROGRAM: Access Code: [email or web address] URL: [email or web address] Date: 11/22/2023 Prepared by: Kristin Zaunegger  Exercises - Supine Bridge  - 1 x daily - 7 x weekly - 2 sets - 10 reps - Clamshell  - 1 x daily - 7 x weekly - 2 sets - 10 reps  ASSESSMENT:  CLINICAL IMPRESSION: Pt has attended 22 visits of PT and has made significant progress towards goals. Pt has met 2/2 STG and 4/6 LTG. Has not reached strength goal of 5/5 MMT though she has made significant progress since initial session. Significant improvement on 5xSTS compared to initial session. With stair climbing, she has improved performance, though still does c/o 4/10 L knee pain with stair descent. With MMT today, she does demonstrate L LE weakness compared to R LE. Remains challenged with prone hip extension. Pt will benefit from independent gym program when she returns from her 2 month trip. Reviewed HEP and gym exercises with pt with verbalized understanding. Pt will be d/c at this time.     OBJECTIVE IMPAIRMENTS: Abnormal gait, decreased activity tolerance, decreased balance, decreased endurance, decreased mobility, difficulty walking, decreased ROM, decreased strength, increased muscle spasms, impaired flexibility, improper body mechanics, and pain.   ACTIVITY LIMITATIONS: carrying, lifting, bending, standing, squatting, stairs, transfers, locomotion  level, and caring for others  PARTICIPATION LIMITATIONS: meal prep, cleaning, laundry, shopping, community activity, and yard work  PERSONAL FACTORS: Time since onset of injury/illness/exacerbation and 3+  comorbidities: HTN, afib, hx hip and knee pain, hx back pain are also affecting patient's functional outcome.   REHAB POTENTIAL: Good  CLINICAL DECISION MAKING: Evolving/moderate complexity  EVALUATION COMPLEXITY: Moderate   GOALS: Goals reviewed with patient? Yes  SHORT TERM GOALS: Target date: 12/20/2023    Patient will be independent with HEP in order to improve functional outcomes. Baseline: Goal status: MET 6/3  2.  Patient will report at least 25% improvement in symptoms for improved quality of life. Baseline: Goal status: MET 6/3    LONG TERM GOALS: Target date: 01/17/2024    Patient will report at least 75% improvement in symptoms for improved quality of life. Baseline:  Goal status: MET 7/22  2.  Patient will improve LEFS score by at least 9 points in order to indicate improved tolerance to activity. Baseline: 31/80 Goal status: MET 6/5 (43/80)  3.  Patient will be able to navigate stairs with reciprocal pattern without compensation in order to demonstrate improved LE strength. Baseline:  Goal status: MET 7/22  4.  Patient will demonstrate grade of 5/5 MMT grade in all tested musculature as evidence of improved strength to assist with stair ambulation and gait.  Baseline:  Goal status: IN PROGRESS 7/22  5.  Patient will be able to complete 5x STS in under 15 seconds in order to demo improved functional strength. Baseline:  Goal status: MET 6/5  6.  Patient will be able to perform full gym routine independently.  Baseline:  Goal status: IN PROGRESS (has not yet returned to gym, but able to perform gym machines in clinic. 7/22)   PLAN:  PT FREQUENCY: 2x/week  PT DURATION: 8 weeks  PLANNED INTERVENTIONS: 97164- PT Re-evaluation, 97110-Therapeutic exercises, 97530- Therapeutic activity, 97112- Neuromuscular re-education, 97535- Self Care, 02859- Manual therapy, [phone]- Gait training, [phone]- Orthotic Fit/training, [phone]- Canalith repositioning, [id number]-  Aquatic Therapy, [phone]- Splinting, Patient/Family education, Balance training, Stair training, Taping, Dry Needling, Joint mobilization, Joint manipulation, Spinal manipulation, Spinal mobilization, Scar mobilization, and DME instructions.  PLAN FOR NEXT SESSION: n/a   Asberry BRAVO Aprille Sawhney, PTA 03/06/2024, 11:12 AM

## 2024-03-07 DIAGNOSIS — M25551 Pain in right hip: Secondary | ICD-10-CM | POA: Diagnosis not present

## 2024-03-07 DIAGNOSIS — M7061 Trochanteric bursitis, right hip: Secondary | ICD-10-CM | POA: Diagnosis not present

## 2024-03-07 DIAGNOSIS — M25561 Pain in right knee: Secondary | ICD-10-CM | POA: Diagnosis not present

## 2024-03-11 ENCOUNTER — Ambulatory Visit (INDEPENDENT_AMBULATORY_CARE_PROVIDER_SITE_OTHER): Admitting: Obstetrics and Gynecology

## 2024-03-11 ENCOUNTER — Encounter: Payer: Self-pay | Admitting: Obstetrics and Gynecology

## 2024-03-11 VITALS — BP 108/64 | HR 94

## 2024-03-11 DIAGNOSIS — N8110 Cystocele, unspecified: Secondary | ICD-10-CM

## 2024-03-11 DIAGNOSIS — N812 Incomplete uterovaginal prolapse: Secondary | ICD-10-CM

## 2024-03-11 DIAGNOSIS — N814 Uterovaginal prolapse, unspecified: Secondary | ICD-10-CM

## 2024-03-11 DIAGNOSIS — N952 Postmenopausal atrophic vaginitis: Secondary | ICD-10-CM

## 2024-03-11 NOTE — Progress Notes (Signed)
 [facility]   Subjective:     Chief Complaint: Follow-up [name] is a 78 y.o. female here today for pessary check.)  History of Present Illness: [name] is a 78 y.o. female with stage II pelvic organ prolapse who presents today for a pessary fitting.    Past Medical History: Patient  has a past medical history of Arthritis, ARTHRITIS, HIPS, BILATERAL, Atrial fibrillation (HCC), BURSITIS, SUBTROCHANTERIC, HYPERTENSION, and Mixed hyperlipidemia.   Past Surgical History: She  has a past surgical history that includes Tonsillectomy and Dilation and curettage of uterus.   Medications: She has a current medication list which includes the following prescription(s): acetaminophen , calcium carbonate, cholecalciferol, co-enzyme q-10, diltiazem , diltiazem , duloxetine , estradiol , hydrochlorothiazide , hydrocodone -acetaminophen , klor-con  m10, lisinopril , magnesium oxide, memantine , pravastatin , and rivaroxaban .   Allergies: Patient is allergic to nickel.   Social History: Patient  reports that she has quit smoking. Her smoking use included cigarettes. She has never used smokeless tobacco. She reports current alcohol use. She reports that she does not use drugs.      Objective:    BP 108/64   Pulse 94  Gen: No apparent distress, A&O x 3. Pelvic Exam: Normal external female genitalia; Bartholin's and Skene's glands normal in appearance; urethral meatus normal in appearance, no urethral masses or discharge.   Attempted a #3 Cube without success Attempted a #5 incontinence dish which was a little too large with the knob.   A size #5 Shattz pessary was fitted. It was comfortable, stayed in place with valsalva and was an appropriate size on examination, with one finger fitting between the pessary and the vaginal walls. We tied a string to it and the patient states if she has too she can remove it. She did not want to remove at this point due to irritation at the  vaginal opening.   Assessment/Plan:    Assessment: [name] is a 78 y.o. with stage II pelvic organ prolapse who presents for a pessary fitting. Plan: She was fitted with a #5 Shattz pessary. She will keep the pessary in place until next visit. She will use estrogen.   Follow-up in 2 months for a pessary check or sooner as needed.  All questions were answered.    Katriona Schmierer G Edwardo Wojnarowski, NP

## 2024-03-14 ENCOUNTER — Ambulatory Visit: Admitting: Obstetrics

## 2024-03-20 ENCOUNTER — Other Ambulatory Visit: Payer: Self-pay | Admitting: Internal Medicine

## 2024-04-21 ENCOUNTER — Encounter: Payer: Self-pay | Admitting: Internal Medicine

## 2024-04-21 ENCOUNTER — Other Ambulatory Visit: Payer: Self-pay

## 2024-04-21 MED ORDER — RIVAROXABAN 20 MG PO TABS: 20.0000 mg | ORAL_TABLET | Freq: Every day | ORAL | 1 refills | Status: AC

## 2024-04-24 ENCOUNTER — Encounter: Payer: Self-pay | Admitting: Internal Medicine

## 2024-04-25 ENCOUNTER — Other Ambulatory Visit: Payer: Self-pay

## 2024-04-25 MED ORDER — KLOR-CON M10 10 MEQ PO TBCR: 10.0000 meq | EXTENDED_RELEASE_TABLET | Freq: Three times a day (TID) | ORAL | 1 refills | Status: AC

## 2024-05-08 ENCOUNTER — Other Ambulatory Visit: Payer: Self-pay | Admitting: Internal Medicine

## 2024-05-08 DIAGNOSIS — Z1231 Encounter for screening mammogram for malignant neoplasm of breast: Secondary | ICD-10-CM

## 2024-05-18 ENCOUNTER — Encounter: Payer: Self-pay | Admitting: Internal Medicine

## 2024-05-18 NOTE — Progress Notes (Unsigned)
    Subjective:    Patient ID: [name], female    DOB: 1946/05/06, 78 y.o.   MRN: [id number]      HPI [name] is here for No chief complaint on file.        Medications and allergies reviewed with patient and updated if appropriate.  Current Outpatient Medications on File Prior to Visit  Medication Sig Dispense Refill   acetaminophen  (TYLENOL ) 650 MG CR tablet Take 650 mg by mouth 2 (two) times daily.      calcium carbonate (TUMS - DOSED IN MG ELEMENTAL CALCIUM) 500 MG chewable tablet Chew 1 tablet by mouth daily.     cholecalciferol (VITAMIN D ) 1000 UNITS tablet Take 2,000 Units by mouth daily.     co-enzyme Q-10 50 MG capsule Take 50 mg by mouth daily.     diltiazem  (CARDIZEM  CD) 180 MG 24 hr capsule TAKE 1 CAPSULE BY MOUTH TWICE A DAY 180 capsule 3   diltiazem  (CARDIZEM ) 30 MG tablet TAKE 1 TABLET EVERY 4 HOURS AS NEEDED FOR AFIB HEART RATE >100 30 tablet 2   DULoxetine  (CYMBALTA ) 30 MG capsule TAKE 1 CAPSULE BY MOUTH EVERY DAY 90 capsule 1   estradiol  (ESTRACE ) 0.1 MG/GM vaginal cream Place 0.5 g vaginally 2 (two) times a week. Place 0.5g nightly for two weeks then twice a week after 42 g 11   hydrochlorothiazide  (HYDRODIURIL ) 25 MG tablet TAKE 1 TABLET (25 MG TOTAL) BY MOUTH DAILY. 90 tablet 1   HYDROcodone -acetaminophen  (NORCO/VICODIN) 5-325 MG tablet Take 1 tablet by mouth every 6 (six) hours as needed for severe pain (pain score 7-10). 20 tablet 0   KLOR-CON  M10 10 MEQ tablet Take 1 tablet (10 mEq total) by mouth 3 (three) times daily. 270 tablet 1   lisinopril  (ZESTRIL ) 40 MG tablet TAKE 1 TABLET BY MOUTH EVERY DAY 90 tablet 1   magnesium oxide (MAG-OX) 400 MG tablet Take 400 mg by mouth daily.     memantine  (NAMENDA ) 5 MG tablet Take 1 tablet (5 mg at night) for 2 weeks, then increase to 1 tablet (5 mg) twice a day 180 tablet 3   pravastatin  (PRAVACHOL ) 40 MG tablet TAKE 1 TABLET BY MOUTH EVERY DAY 90 tablet 3   rivaroxaban  (XARELTO ) 20 MG TABS tablet Take 1  tablet (20 mg total) by mouth daily with supper. 90 tablet 1   No current facility-administered medications on file prior to visit.    Review of Systems     Objective:  There were no vitals filed for this visit. BP Readings from Last 3 Encounters:  03/11/24 108/64  02/25/24 120/75  02/09/24 136/68   Wt Readings from Last 3 Encounters:  02/25/24 167 lb 6.4 oz (75.9 kg)  01/23/24 169 lb (76.7 kg)  01/16/24 168 lb (76.2 kg)   There is no height or weight on file to calculate BMI.    Physical Exam         Assessment & Plan:    See Problem List for Assessment and Plan of chronic medical problems.

## 2024-05-19 ENCOUNTER — Ambulatory Visit: Admitting: Obstetrics and Gynecology

## 2024-05-19 ENCOUNTER — Encounter: Payer: Self-pay | Admitting: Obstetrics and Gynecology

## 2024-05-19 VITALS — BP 103/57 | HR 84

## 2024-05-19 DIAGNOSIS — N814 Uterovaginal prolapse, unspecified: Secondary | ICD-10-CM

## 2024-05-19 DIAGNOSIS — N3946 Mixed incontinence: Secondary | ICD-10-CM

## 2024-05-19 DIAGNOSIS — N812 Incomplete uterovaginal prolapse: Secondary | ICD-10-CM

## 2024-05-19 DIAGNOSIS — N8110 Cystocele, unspecified: Secondary | ICD-10-CM

## 2024-05-19 NOTE — Progress Notes (Signed)
 [facility]   Subjective:     Chief Complaint:  Chief Complaint  Patient presents with   Pessary Check    [name] is a 78 y.o. female is here for pessary check    History of Present Illness: [name] is a 78 y.o. female with stage II pelvic organ prolapse who presents for a pessary check. She is using a size #5 Sharrz pessary. The pessary has been working well but she would like to be able to have sex and is considering surgery. She is not using vaginal estrogen. She denies vaginal bleeding.  Past Medical History: Patient  has a past medical history of Arthritis, ARTHRITIS, HIPS, BILATERAL, Atrial fibrillation (HCC), BURSITIS, SUBTROCHANTERIC, HYPERTENSION, and Mixed hyperlipidemia.   Past Surgical History: She  has a past surgical history that includes Tonsillectomy and Dilation and curettage of uterus.   Medications: She has a current medication list which includes the following prescription(s): acetaminophen , calcium carbonate, cholecalciferol, co-enzyme q-10, diltiazem , diltiazem , duloxetine , estradiol , hydrochlorothiazide , hydrocodone -acetaminophen , klor-con  m10, lisinopril , magnesium oxide, memantine , pravastatin , and rivaroxaban .   Allergies: Patient is allergic to nickel.   Social History: Patient  reports that she has quit smoking. Her smoking use included cigarettes. She has never used smokeless tobacco. She reports current alcohol use. She reports that she does not use drugs.      Objective:    Physical Exam: BP (!) 103/57 (Cuff Size: Normal)   Pulse 84  Gen: No apparent distress, A&O x 3. Detailed Urogynecologic Evaluation:  Pelvic Exam: Normal external female genitalia; Bartholin's and Skene's glands normal in appearance; urethral meatus normal in appearance, no urethral masses or discharge. The pessary was noted to be in place. It was removed and cleaned. Speculum exam revealed no lesions in the vagina. The pessary was replaced.  It was comfortable to the patient and fit well.     Assessment/Plan:    Assessment: [name] is a 78 y.o. with stage II pelvic organ prolapse here for a pessary check. She is doing well.  Plan: She will keep the pessary in place until next visit. She will continue to use estrogen. She will follow-up for urodynamics testing and the a follow up to discuss surgery. We did briefly discuss surgical options including both robotic and vaginal surgery. She is unsure if she wants to keep her uterus but does want to remain sexually active. 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 She is open to this plan of care.   Patient would need cardiology and possible neurology clearance due to Chronic A-fib and cognitive memory changes.

## 2024-05-19 NOTE — Patient Instructions (Signed)
 Consider your surgical options

## 2024-05-20 ENCOUNTER — Ambulatory Visit (INDEPENDENT_AMBULATORY_CARE_PROVIDER_SITE_OTHER): Admitting: Internal Medicine

## 2024-05-20 VITALS — BP 120/70 | HR 69 | Temp 98.0°F | Ht 62.7 in | Wt 169.0 lb

## 2024-05-20 DIAGNOSIS — I10 Essential (primary) hypertension: Secondary | ICD-10-CM

## 2024-05-20 DIAGNOSIS — E6609 Other obesity due to excess calories: Secondary | ICD-10-CM

## 2024-05-20 DIAGNOSIS — Z6830 Body mass index (BMI) 30.0-30.9, adult: Secondary | ICD-10-CM

## 2024-05-20 DIAGNOSIS — R7303 Prediabetes: Secondary | ICD-10-CM | POA: Diagnosis not present

## 2024-05-20 DIAGNOSIS — E66811 Obesity, class 1: Secondary | ICD-10-CM

## 2024-05-20 DIAGNOSIS — I700 Atherosclerosis of aorta: Secondary | ICD-10-CM | POA: Insufficient documentation

## 2024-05-20 MED ORDER — TIRZEPATIDE-WEIGHT MANAGEMENT 2.5 MG/0.5ML ~~LOC~~ SOLN: 2.5000 mg | SUBCUTANEOUS | 0 refills | Status: DC

## 2024-05-20 NOTE — Patient Instructions (Addendum)
    [facility] for zepbound --- [email or web address]    Medications changes include :   zepbound 2.5 mg weekly

## 2024-05-20 NOTE — Assessment & Plan Note (Signed)
 Chronic Blood pressure well controlled Continue diltiazem  180 mg twice daily, HCTZ 25 mg daily, lisinopril  40 mg daily

## 2024-05-20 NOTE — Assessment & Plan Note (Signed)
 Chronic Weight has increased - having difficulty losing weight and maintaining weight Interested in weight loss medication Stressed regular exercise - goal is 150 min / week Stressed decreasing calorie intake - diet high in protein, fiber, veges - decrease portions Will try zepbound 2.5 mg weekly - discussed side effects - will get rx via [facility]

## 2024-05-20 NOTE — Assessment & Plan Note (Signed)
 Chronic Lab Results  Component Value Date   HGBA1C 5.3 01/23/2024   Discussed weight loss Low sugar / carb diet - smaller portions Stressed regular exercise

## 2024-05-26 ENCOUNTER — Ambulatory Visit: Admitting: Obstetrics and Gynecology

## 2024-06-03 ENCOUNTER — Ambulatory Visit
Admission: RE | Admit: 2024-06-03 | Discharge: 2024-06-03 | Disposition: A | Source: Ambulatory Visit | Attending: Internal Medicine

## 2024-06-03 DIAGNOSIS — Z1231 Encounter for screening mammogram for malignant neoplasm of breast: Secondary | ICD-10-CM

## 2024-06-05 DIAGNOSIS — Z23 Encounter for immunization: Secondary | ICD-10-CM | POA: Diagnosis not present

## 2024-06-10 ENCOUNTER — Other Ambulatory Visit: Payer: Self-pay | Admitting: Internal Medicine

## 2024-06-11 DIAGNOSIS — D225 Melanocytic nevi of trunk: Secondary | ICD-10-CM | POA: Diagnosis not present

## 2024-06-11 DIAGNOSIS — L570 Actinic keratosis: Secondary | ICD-10-CM | POA: Diagnosis not present

## 2024-06-11 DIAGNOSIS — L738 Other specified follicular disorders: Secondary | ICD-10-CM | POA: Diagnosis not present

## 2024-06-11 DIAGNOSIS — Z86018 Personal history of other benign neoplasm: Secondary | ICD-10-CM | POA: Diagnosis not present

## 2024-06-11 DIAGNOSIS — D2271 Melanocytic nevi of right lower limb, including hip: Secondary | ICD-10-CM | POA: Diagnosis not present

## 2024-06-11 DIAGNOSIS — D485 Neoplasm of uncertain behavior of skin: Secondary | ICD-10-CM | POA: Diagnosis not present

## 2024-06-11 DIAGNOSIS — L821 Other seborrheic keratosis: Secondary | ICD-10-CM | POA: Diagnosis not present

## 2024-06-11 DIAGNOSIS — D2230 Melanocytic nevi of unspecified part of face: Secondary | ICD-10-CM | POA: Diagnosis not present

## 2024-06-11 DIAGNOSIS — D224 Melanocytic nevi of scalp and neck: Secondary | ICD-10-CM | POA: Diagnosis not present

## 2024-06-11 DIAGNOSIS — L578 Other skin changes due to chronic exposure to nonionizing radiation: Secondary | ICD-10-CM | POA: Diagnosis not present

## 2024-06-12 ENCOUNTER — Telehealth: Payer: Self-pay

## 2024-06-12 NOTE — Telephone Encounter (Signed)
 Refill was sent yesterday to [facility].

## 2024-06-12 NOTE — Telephone Encounter (Signed)
 Copied from CRM [id number]. Topic: Clinical - Prescription Issue >> Jun 12, 2024  8:41 AM Gustabo D wrote: ZEPBOUND 2.5 MG/0.5ML injection vial- pt says her pharmacy is needing her pcp to refill this prescription. Pt wants to know if she needs a appt to get.

## 2024-06-17 DIAGNOSIS — Z961 Presence of intraocular lens: Secondary | ICD-10-CM | POA: Diagnosis not present

## 2024-06-17 DIAGNOSIS — H353131 Nonexudative age-related macular degeneration, bilateral, early dry stage: Secondary | ICD-10-CM | POA: Diagnosis not present

## 2024-06-17 DIAGNOSIS — H52203 Unspecified astigmatism, bilateral: Secondary | ICD-10-CM | POA: Diagnosis not present

## 2024-06-17 DIAGNOSIS — H35372 Puckering of macula, left eye: Secondary | ICD-10-CM | POA: Diagnosis not present

## 2024-06-26 ENCOUNTER — Other Ambulatory Visit: Payer: Self-pay | Admitting: Internal Medicine

## 2024-07-02 ENCOUNTER — Other Ambulatory Visit: Payer: Self-pay

## 2024-07-02 ENCOUNTER — Other Ambulatory Visit: Payer: Self-pay | Admitting: Internal Medicine

## 2024-07-02 ENCOUNTER — Ambulatory Visit: Payer: Self-pay

## 2024-07-02 ENCOUNTER — Telehealth: Payer: Self-pay | Admitting: Physician Assistant

## 2024-07-02 MED ORDER — MEMANTINE HCL 5 MG PO TABS: ORAL_TABLET | ORAL | 1 refills | Status: AC

## 2024-07-02 NOTE — Telephone Encounter (Signed)
 FYI Only or Action Required?: FYI only for provider: will monitor injection sites at home.  Patient was last seen in primary care on 05/20/2024 by Geofm Glade PARAS, MD.  Called Nurse Triage reporting Medication Reaction.  Symptoms began several days ago.  Interventions attempted: Nothing.  Symptoms are: gradually improving.  Triage Disposition: Home Care  Patient/caregiver understands and will follow disposition?: Yes  Message from [facility] C sent at 07/02/2024  9:12 AM EST  Summary: Zepbound  Reaction Possible   Reason for Triage: Zepbound  about six weeks ago- gotten a reaction it. Left arm- big red, round patch that itches and gets swollen.  Patient did the Zepbound  in a different arm and it left a red mark and it's taken longer to go away. She doesn't have any symptoms, just itching. She was thinking to do it in her leg, but is not sure. She doesnt have swelling of the mouth, face,or anything. Just at the site.  [phone] (M)         Reason for Disposition  Mild localized rash  Answer Assessment - Initial Assessment Questions Patient started Zepbound  6 weeks ago. Husband giving her injections in the left arm but rotating where on the left arm. Injections every Saturday and 2 injections ago she developed an injection site reaction on the left arm. Redness about the size of 2 quarters, slight swelling. Took a few days to resolve but resolved completely prior to the next injection, given in the right arm. Right arm with a similar reaction that is slowly resolving on its own. Denies SOB, Throat or tongue welling, CP, or Dizziness. She denies numbness tingling, or weakness in the side of injection. Denies pain and only occasionally does it itch mildly.   Advised to try Ice, hydrocortisone cream or benadryl. She will rotate the next injection to her leg or stomach. She will monitor for another reaction and record details. She will mark the size of the reaction if she gets one to monitor  the progression and alert MD to continued reactions.   ED/UC precautions advised for any systemic reaction. Pt understands.   1. APPEARANCE of RASH: What does the rash look like? (e.g., blisters, dry flaky skin, red spots, redness, sores)     Red, slightly raised  2. LOCATION: Where is the rash located?      Injection site- left and right arm 3. NUMBER: How many spots are there?      2nd  4. SIZE: How big are the spots? (e.g., inches, cm; or compare to size of pinhead, tip of pen, eraser, pea)      About the size of 2 quarter 5. ONSET: When did the rash start?      Injection other site 6. ITCHING: Does the rash itch? If Yes, ask: How bad is the itch?  (Scale 0-10; or none, mild, moderate, severe)     Itches every now and then  7. PAIN: Does the rash hurt? If Yes, ask: How bad is the pain?  (Scale 0-10; or none, mild, moderate, severe)     denies 8. OTHER SYMPTOMS: Do you have any other symptoms? (e.g., fever)     denies  Protocols used: Rash or Redness - Localized-A-AH

## 2024-07-02 NOTE — Telephone Encounter (Signed)
 Sent in

## 2024-07-02 NOTE — Telephone Encounter (Signed)
 Pt called in  this morning. Pt wants to get a 90 day supply for prescription called: memantine  (NAMENDA ) 5 MG tablet . Thanks

## 2024-07-06 ENCOUNTER — Other Ambulatory Visit: Payer: Self-pay | Admitting: Internal Medicine

## 2024-07-08 ENCOUNTER — Ambulatory Visit: Payer: Self-pay

## 2024-07-08 NOTE — Telephone Encounter (Signed)
 FYI Only or Action Required?: FYI only for provider: appointment scheduled on 12.4.25.  Patient was last seen in primary care on 05/20/2024 by Geofm Glade PARAS, MD.  Called Nurse Triage reporting Allergic Reaction and Medication Reaction.  Symptoms began yesterday.  Interventions attempted: OTC medications: benadryl.  Symptoms are: gradually worsening.  Triage Disposition: See Physician Within 24 Hours  Patient/caregiver understands and will follow disposition?: Yes       Copied from CRM #[id number]. Topic: Clinical - Red Word Triage >> Jul 08, 2024  4:05 PM Tinnie BROCKS wrote: Red Word that prompted transfer to Nurse Triage: Allergic reaction to zepbound . Triaged 11/26, was told to change spot she injected it and if the reaction still happened to come in for an appt. Reason for Disposition  Taking new prescription medicine  (Exceptions: Finished taking new prescription antibiotic OR questions about flushing from niacin)  Answer Assessment - Initial Assessment Questions Pt has had a localized reaction to the zepbound  the last 3 injections. Each one worse than the one before. She states the first 4 injections she had no problems with. She states first time was the size of a quarter. Second time the size of a half dollar. This time,Monday she noticed the size of a quarter, redness and itching, took a benadryl which did not help. Monday night it was larger. Today it is about 3 inches in diameter.  Pt denies any chest pain, shortness of breath, throat swelling, headache, nausea, or vomiting. Most recent site is right thigh. Rn offered appt for tomorrow but pt declined due to another appt and not sure what to expect from that appt. Scheduled for Thursday. Gave instructions on when to call back. Pt stated understanding. Rn did also give care advise and added pt could try ice to the area to see if the redness subsides at all.        1. APPEARANCE of RASH: What does the rash look like? (e.g.,  spots, blisters, raised areas, skin peeling, scaly)     Redness around each injection site 2. SIZE: How big are the spots? (e.g., tip of pen, eraser, coin; inches, centimeters)     Latest one is 3 inches 3. LOCATION: Where is the rash located?     Right thigh 4. COLOR: What color is the rash? (Note: It is difficult to assess rash color in people with darker-colored skin. When this situation occurs, simply ask the caller to describe what they see.)     Red in the center, pink further out 5. ONSET: When did the rash begin?     Monday 6. FEVER: Do you have a fever? If Yes, ask: What is your temperature, how was it measured, and when did it start?     denies 7. ITCHING: Does the rash itch? If Yes, ask: How bad is the itch? (Scale 1-10; or mild, moderate, severe)     mild 8. CAUSE: What do you think is causing the rash?     zepbound  10. OTHER SYMPTOMS: Do you have any other symptoms? (e.g., sore throat, fever, joint pain)       denies  Protocols used: Rash - Widespread On Drugs-A-AH

## 2024-07-09 ENCOUNTER — Ambulatory Visit: Admitting: Obstetrics and Gynecology

## 2024-07-09 DIAGNOSIS — N3946 Mixed incontinence: Secondary | ICD-10-CM

## 2024-07-09 NOTE — Progress Notes (Unsigned)
    Subjective:    Patient ID: [name], female    DOB: [birth year], 78 y.o.   MRN: [id number]      HPI Austyn is here for No chief complaint on file.  Having injection site reactions to Zepbound .  Has had 4 injections so far and with each injection had a larger area of redness and itching.      Medications and allergies reviewed with patient and updated if appropriate.  Current Outpatient Medications on File Prior to Visit  Medication Sig Dispense Refill   acetaminophen  (TYLENOL ) 650 MG CR tablet Take 650 mg by mouth 2 (two) times daily.      calcium carbonate (TUMS - DOSED IN MG ELEMENTAL CALCIUM) 500 MG chewable tablet Chew 1 tablet by mouth daily.     cholecalciferol (VITAMIN D ) 1000 UNITS tablet Take 2,000 Units by mouth daily.     co-enzyme Q-10 50 MG capsule Take 50 mg by mouth daily.     diltiazem  (CARDIZEM  CD) 180 MG 24 hr capsule TAKE 1 CAPSULE BY MOUTH TWICE A DAY 180 capsule 3   diltiazem  (CARDIZEM ) 30 MG tablet TAKE 1 TABLET EVERY 4 HOURS AS NEEDED FOR AFIB HEART RATE >100 30 tablet 2   DULoxetine  (CYMBALTA ) 30 MG capsule TAKE 1 CAPSULE BY MOUTH EVERY DAY 90 capsule 1   estradiol  (ESTRACE ) 0.1 MG/GM vaginal cream Place 0.5 g vaginally 2 (two) times a week. Place 0.5g nightly for two weeks then twice a week after 42 g 11   hydrochlorothiazide  (HYDRODIURIL ) 25 MG tablet TAKE 1 TABLET (25 MG TOTAL) BY MOUTH DAILY. 90 tablet 1   HYDROcodone -acetaminophen  (NORCO/VICODIN) 5-325 MG tablet Take 1 tablet by mouth every 6 (six) hours as needed for severe pain (pain score 7-10). 20 tablet 0   KLOR-CON  M10 10 MEQ tablet Take 1 tablet (10 mEq total) by mouth 3 (three) times daily. 270 tablet 1   lisinopril  (ZESTRIL ) 40 MG tablet TAKE 1 TABLET BY MOUTH EVERY DAY 90 tablet 1   magnesium oxide (MAG-OX) 400 MG tablet Take 400 mg by mouth daily.     memantine  (NAMENDA ) 5 MG tablet Take 1 tablet (5 mg at night) for 2 weeks, then increase to 1 tablet (5 mg) twice a day 180 tablet  1   pravastatin  (PRAVACHOL ) 40 MG tablet TAKE 1 TABLET BY MOUTH EVERY DAY 90 tablet 3   rivaroxaban  (XARELTO ) 20 MG TABS tablet Take 1 tablet (20 mg total) by mouth daily with supper. 90 tablet 1   ZEPBOUND  2.5 MG/0.5ML injection vial INJECT 0.5 ML (2.5 MG) UNDER THE SKIN ONCE WEEKLY (0.5ML= 50 UNITS) (Patient not taking: Reported on 07/09/2024) 2 mL 0   No current facility-administered medications on file prior to visit.    Review of Systems     Objective:  There were no vitals filed for this visit. BP Readings from Last 3 Encounters:  07/09/24 (!) 104/57  05/20/24 120/70  05/19/24 (!) 103/57   Wt Readings from Last 3 Encounters:  05/20/24 169 lb (76.7 kg)  02/25/24 167 lb 6.4 oz (75.9 kg)  01/23/24 169 lb (76.7 kg)   There is no height or weight on file to calculate BMI.    Physical Exam         Assessment & Plan:    See Problem List for Assessment and Plan of chronic medical problems.

## 2024-07-09 NOTE — Progress Notes (Signed)
 Patient unable to urinate for uroflow. She attempted to drink 3-4 bottles of water but after 30 minutes was still unable to complete uroflow. Will reschedule patient for UDS. She will keep her appointment with surgeon for surgical discussion and we can plan for UDS after.

## 2024-07-10 ENCOUNTER — Ambulatory Visit: Admitting: Internal Medicine

## 2024-07-10 ENCOUNTER — Encounter: Payer: Self-pay | Admitting: Internal Medicine

## 2024-07-10 VITALS — BP 112/70 | HR 68 | Temp 98.0°F | Ht 62.7 in | Wt 158.0 lb

## 2024-07-10 DIAGNOSIS — E6609 Other obesity due to excess calories: Secondary | ICD-10-CM | POA: Diagnosis not present

## 2024-07-10 DIAGNOSIS — T8090XA Unspecified complication following infusion and therapeutic injection, initial encounter: Secondary | ICD-10-CM | POA: Insufficient documentation

## 2024-07-10 DIAGNOSIS — Z6830 Body mass index (BMI) 30.0-30.9, adult: Secondary | ICD-10-CM | POA: Diagnosis not present

## 2024-07-10 DIAGNOSIS — E66811 Obesity, class 1: Secondary | ICD-10-CM

## 2024-07-10 MED ORDER — WEGOVY 0.25 MG/0.5ML ~~LOC~~ SOAJ: 0.2500 mg | SUBCUTANEOUS | 0 refills | Status: AC

## 2024-07-10 NOTE — Assessment & Plan Note (Signed)
 Acute Related to zepbound   Getting worse so will stop medication Should resolve on its own - she will let me know if it does not Benadryl prn

## 2024-07-10 NOTE — Assessment & Plan Note (Signed)
 Chronic Weight prior to zepbound  169 lb Having injection site reactions to zepbound  that are getting worse with each injection so will stop zepbound  Will start wegovy 0.25 mg weekly and titrate as tolerated Advised staying on a low dose Stressed good protein intake, regular exercise with weights ideally Reviewed possible side effects Rx sent to [facility]

## 2024-07-10 NOTE — Patient Instructions (Addendum)
     Stop zepbound     [facility] pharmacy - wegovy 0.25 mg weekly

## 2024-07-22 ENCOUNTER — Encounter (HOSPITAL_BASED_OUTPATIENT_CLINIC_OR_DEPARTMENT_OTHER): Payer: Self-pay | Admitting: Emergency Medicine

## 2024-07-22 ENCOUNTER — Other Ambulatory Visit: Payer: Self-pay

## 2024-07-22 ENCOUNTER — Emergency Department (HOSPITAL_BASED_OUTPATIENT_CLINIC_OR_DEPARTMENT_OTHER): Admitting: Radiology

## 2024-07-22 ENCOUNTER — Emergency Department (HOSPITAL_BASED_OUTPATIENT_CLINIC_OR_DEPARTMENT_OTHER)

## 2024-07-22 ENCOUNTER — Emergency Department (HOSPITAL_BASED_OUTPATIENT_CLINIC_OR_DEPARTMENT_OTHER): Admission: EM | Admit: 2024-07-22 | Discharge: 2024-07-22 | Disposition: A | Discharge status: Home / Self Care

## 2024-07-22 DIAGNOSIS — W010XXA Fall on same level from slipping, tripping and stumbling without subsequent striking against object, initial encounter: Secondary | ICD-10-CM | POA: Diagnosis not present

## 2024-07-22 DIAGNOSIS — Z7901 Long term (current) use of anticoagulants: Secondary | ICD-10-CM | POA: Diagnosis not present

## 2024-07-22 DIAGNOSIS — I4891 Unspecified atrial fibrillation: Secondary | ICD-10-CM | POA: Diagnosis not present

## 2024-07-22 DIAGNOSIS — S0003XA Contusion of scalp, initial encounter: Secondary | ICD-10-CM | POA: Diagnosis not present

## 2024-07-22 DIAGNOSIS — R0789 Other chest pain: Secondary | ICD-10-CM | POA: Diagnosis present

## 2024-07-22 DIAGNOSIS — W19XXXA Unspecified fall, initial encounter: Secondary | ICD-10-CM

## 2024-07-22 DIAGNOSIS — S20212A Contusion of left front wall of thorax, initial encounter: Secondary | ICD-10-CM | POA: Diagnosis not present

## 2024-07-22 DIAGNOSIS — S298XXA Other specified injuries of thorax, initial encounter: Secondary | ICD-10-CM

## 2024-07-22 MED ORDER — LIDOCAINE 5 % EX PTCH: 1.0000 | MEDICATED_PATCH | CUTANEOUS | 0 refills | Status: AC

## 2024-07-22 MED ORDER — OXYCODONE-ACETAMINOPHEN 5-325 MG PO TABS: 1.0000 | ORAL_TABLET | Freq: Four times a day (QID) | ORAL | 0 refills | Status: AC | PRN

## 2024-07-22 NOTE — ED Provider Notes (Signed)
 [place] EMERGENCY DEPARTMENT AT [facility] Provider Note   CSN: [id number] Arrival date & time: 07/22/24  9145     Patient presents with: [place]   [name] is a 79 y.o. female patient with history of atrial fibrillation on Xarelto  who presents to the emergency department today for further evaluation of a mechanical trip and fall.  She states that her slipper got caught on the carpet causing her to fall forward.  She did try to brace herself but did hit the front of her scalp and chest.  Patient did not lose consciousness.  She is complaining of chest wall pain that is worse with deep inspirations.  Also complaining of swelling to the frontal scalp.  Denies any focal weakness, numbness, nausea, vomiting, diarrhea.    Fall       Prior to Admission medications  Medication Sig Start Date End Date Taking? Authorizing Provider  lidocaine  (LIDODERM ) 5 % Place 1 patch onto the skin daily. Remove & Discard patch within 12 hours or as directed by MD 07/22/24  Yes Theotis, Keyvin Rison M, PA-C  oxyCODONE -acetaminophen  (PERCOCET/ROXICET) 5-325 MG tablet Take 1 tablet by mouth every 6 (six) hours as needed for severe pain (pain score 7-10). 07/22/24  Yes Theotis, Dameisha Tschida M, PA-C  acetaminophen  (TYLENOL ) 650 MG CR tablet Take 650 mg by mouth 2 (two) times daily.     [provider]  calcium carbonate (TUMS - DOSED IN MG ELEMENTAL CALCIUM) 500 MG chewable tablet Chew 1 tablet by mouth daily.    [provider]  cholecalciferol (VITAMIN D ) 1000 UNITS tablet Take 2,000 Units by mouth daily.    [provider]  co-enzyme Q-10 50 MG capsule Take 50 mg by mouth daily.    [provider]  diltiazem  (CARDIZEM  CD) 180 MG 24 hr capsule TAKE 1 CAPSULE BY MOUTH TWICE A DAY 07/07/24   Burns, Glade JINNY, MD  diltiazem  (CARDIZEM ) 30 MG tablet TAKE 1 TABLET EVERY 4 HOURS AS NEEDED FOR AFIB HEART RATE >100 01/22/23   Geofm Glade JINNY, MD  DULoxetine  (CYMBALTA ) 30 MG capsule  TAKE 1 CAPSULE BY MOUTH EVERY DAY 01/25/24   Geofm Glade JINNY, MD  estradiol  (ESTRACE ) 0.1 MG/GM vaginal cream Place 0.5 g vaginally 2 (two) times a week. Place 0.5g nightly for two weeks then twice a week after 02/25/24   Zuleta, Kaitlin G, NP  hydrochlorothiazide  (HYDRODIURIL ) 25 MG tablet TAKE 1 TABLET (25 MG TOTAL) BY MOUTH DAILY. 07/07/24   Geofm Glade JINNY, MD  HYDROcodone -acetaminophen  (NORCO/VICODIN) 5-325 MG tablet Take 1 tablet by mouth every 6 (six) hours as needed for severe pain (pain score 7-10). 10/11/23   Geofm Glade JINNY, MD  KLOR-CON  M10 10 MEQ tablet Take 1 tablet (10 mEq total) by mouth 3 (three) times daily. 04/25/24   Geofm Glade JINNY, MD  lisinopril  (ZESTRIL ) 40 MG tablet TAKE 1 TABLET BY MOUTH EVERY DAY 06/26/24   Geofm Glade JINNY, MD  magnesium oxide (MAG-OX) 400 MG tablet Take 400 mg by mouth daily.    [provider]  memantine  (NAMENDA ) 5 MG tablet Take 1 tablet (5 mg at night) for 2 weeks, then increase to 1 tablet (5 mg) twice a day 07/02/24   Wertman, Sara E, PA-C  pravastatin  (PRAVACHOL ) 40 MG tablet TAKE 1 TABLET BY MOUTH EVERY DAY 03/20/24   Geofm Glade JINNY, MD  rivaroxaban  (XARELTO ) 20 MG TABS tablet Take 1 tablet (20 mg total) by mouth daily with supper. 04/21/24   Burns,  Glade PARAS, MD  semaglutide -weight management (WEGOVY ) 0.25 MG/0.5ML SOAJ SQ injection Inject 0.25 mg into the skin once a week. 07/10/24   Geofm Glade PARAS, MD    Allergies: Nickel    Review of Systems  All other systems reviewed and are negative.   Updated Vital Signs BP 133/71 (BP Location: Right Arm)   Pulse 76   Temp 97.9 F (36.6 C) (Oral)   Resp 18   SpO2 100%   Physical Exam Vitals and nursing note reviewed.  Constitutional:      General: She is not in acute distress.    Appearance: Normal appearance.  HENT:     Head: Normocephalic.   Eyes:     General:        Right eye: No discharge.        Left eye: No discharge.  Cardiovascular:     Comments: Regular rate and rhythm.  S1/S2 are  distinct without any evidence of murmur, rubs, or gallops.  Radial pulses are 2+ bilaterally.  Dorsalis pedis pulses are 2+ bilaterally.  No evidence of pedal edema. Pulmonary:     Comments: Clear to auscultation bilaterally.  Normal effort.  No respiratory distress.  No evidence of wheezes, rales, or rhonchi heard throughout. Chest:     Comments: No bruising of the anterior chest wall.  There is some tenderness to palpation over the left anterior chest wall. Abdominal:     General: Abdomen is flat. Bowel sounds are normal. There is no distension.     Tenderness: There is no abdominal tenderness. There is no guarding or rebound.  Musculoskeletal:        General: Normal range of motion.     Cervical back: Neck supple.  Skin:    General: Skin is warm and dry.     Findings: No rash.  Neurological:     General: No focal deficit present.     Mental Status: She is alert.     Cranial Nerves: Cranial nerves 2-12 are intact.     Sensory: Sensation is intact. No sensory deficit.     Motor: Motor function is intact. No weakness.     Coordination: Coordination is intact.  Psychiatric:        Mood and Affect: Mood normal.        Behavior: Behavior normal.     (all labs ordered are listed, but only abnormal results are displayed) Labs Reviewed - No data to display  EKG: None  Radiology: CT Cervical Spine Wo Contrast Result Date: 07/22/2024 EXAM: CT CERVICAL SPINE WITHOUT CONTRAST 07/22/2024 10:13:53 AM TECHNIQUE: CT of the cervical spine was performed without the administration of intravenous contrast. Multiplanar reformatted images are provided for review. Automated exposure control, iterative reconstruction, and/or weight based adjustment of the mA/kV was utilized to reduce the radiation dose to as low as reasonably achievable. COMPARISON: CT c-spine 11/25/2023. ultrasound of the thyroid  12/01/23. CLINICAL HISTORY: Fall. FINDINGS: BONES AND ALIGNMENT: No acute fracture or traumatic  malalignment. Stable grade 1 anterolisthesis of C4 on C5. DEGENERATIVE CHANGES: Multilevel moderate degenerative change of the spine. SOFT TISSUES: No prevertebral soft tissue swelling. Bilateral thyroid  gland nodules measuring up to 3.5 cm on the right and 1.3 cm on the left. Previously evaluated on ultrasound of the thyroid  12/01/2023. IMPRESSION: 1. No evidence of acute traumatic injury. 2. Thyroid  nodules. Previously evaluated on ultrasound of the thyroid  12/01/23. Electronically signed by: Morgane Naveau MD 07/22/2024 10:30 AM EST RP Workstation: [id number]   CT Head Wo Contrast  Result Date: 07/22/2024 EXAM: CT HEAD WITHOUT CONTRAST 07/22/2024 10:13:53 AM TECHNIQUE: CT of the head was performed without the administration of intravenous contrast. Automated exposure control, iterative reconstruction, and/or weight based adjustment of the mA/kV was utilized to reduce the radiation dose to as low as reasonably achievable. COMPARISON: 11/25/2023 CLINICAL HISTORY: Fall FINDINGS: BRAIN AND VENTRICLES: No acute hemorrhage. No evidence of acute infarct. No hydrocephalus. No extra-axial collection. No mass effect or midline shift. Supratentorial white matter hypoattenuation. Atherosclerotic calcification of both internal carotid arteries at skull base. ORBITS: Bilateral lens replacement. SINUSES: No acute abnormality. SOFT TISSUES AND SKULL: 8 mm small left frontal scalp hematoma. No skull fracture. IMPRESSION: 1. No acute intracranial abnormality. Electronically signed by: Morgane Naveau MD 07/22/2024 10:27 AM EST RP Workstation: [id number]   DG Chest 2 View Result Date: 07/22/2024 CLINICAL DATA:  Fall this morning.  Difficulty taking deep breath. EXAM: CHEST - 2 VIEW COMPARISON:  05/15/2008 FINDINGS: Lungs are hypoinflated and otherwise clear. Cardiomediastinal silhouette is normal. No acute fracture. Mild spondylosis of the spine. IMPRESSION: Hypoinflation without acute cardiopulmonary disease. Electronically  Signed   By: Toribio Agreste M.D.   On: 07/22/2024 10:17     Procedures   Medications Ordered in the ED - No data to display  Clinical Course as of 07/22/24 1115  Tue Jul 22, 2024  1107 On repeat evaluation, patient feeling about the same.  I went over all imaging with her and her husband at the bedside.  We discussed that there was no bleeding in the brain.  Cervical spine was also normal.  Also notified her that there are no evidence of rib fractures on imaging but she does likely have some rib contusions which we will treat with incentive spirometer, NSAIDs, and lidocaine  patches.  I will have her follow-up with her primary care doctor for further evaluation.  All questions and concerns addressed.  Strict return precautions were discussed. [CF]  1108 CT Head Wo Contrast No evidence of intracranial hemorrhage.  I do agree with the radiologist interpretation. [CF]  1108 DG Chest 2 View No evidence of rib fractures or pneumothorax.  I do agree with the radiologist interpretation. [CF]  1108 CT Cervical Spine Wo Contrast No cervical spine fracture.  I do agree with the radiologist interpretation. [CF]    Clinical Course User Index [CF] Theotis Cameron HERO, PA-C   Medical Decision Making [name] is a 78 y.o. female patient who presents to the emergency department today for further evaluation of mechanical trip and fall.  Low suspicion for any hemorrhagic stroke at this time.  She is not having any obvious neurological symptoms on exam.  Will get CT scan of the head and neck given the patient is on Xarelto .  Will also get a chest x-ray to look for signs of any rib fractures.  All imaging is normal.  Went over this with the patient as highlighted in the ED course.  Will plan to discharge home with incentive spirometer, Percocet for breakthrough pain, lidocaine  patches, and Tylenol .  She does have Tylenol  at home.  Will have her follow-up with her primary care doctor for further evaluation.   Strict turn precautions were discussed.  She is safe for discharge.   Amount and/or Complexity of Data Reviewed Radiology: ordered. Decision-making details documented in ED Course.  Risk Prescription drug management.     Final diagnoses:  Contusion of rib, initial encounter  Fall, initial encounter    ED Discharge Orders  Ordered    lidocaine  (LIDODERM ) 5 %  Every 24 hours        07/22/24 1111    oxyCODONE -acetaminophen  (PERCOCET/ROXICET) 5-325 MG tablet  Every 6 hours PRN        07/22/24 1111               Theotis Cameron HERO, PA-C 07/22/24 1115    Neysa Caron PARAS, DO 07/22/24 1429

## 2024-07-22 NOTE — Discharge Instructions (Signed)
 As we discussed, there was no bleeding in your brain, fractures in your neck, or rib fractures.  We are going to treat this as a rib bruise.  Please use the incentive spirometer 3-4 times per day to prevent developing pneumonia.  Please use lidocaine  patches over the area every 12 hours.  You can take Tylenol  as needed.  I have also prescribed you Percocet which has Tylenol  in it for breakthrough pain.  If you take the Percocet do not take additional Tylenol .  Please follow-up with your primary care doctor for further evaluation.

## 2024-07-22 NOTE — ED Triage Notes (Addendum)
 Mechanical fall this morning. Hit head. Takes thinners.  Denies loc.   Reports hitting chest as well. C/o difficulty taking deep breath.

## 2024-07-23 ENCOUNTER — Ambulatory Visit: Payer: Medicare Other

## 2024-07-23 VITALS — Ht 62.5 in | Wt 155.0 lb

## 2024-07-23 DIAGNOSIS — Z0000 Encounter for general adult medical examination without abnormal findings: Secondary | ICD-10-CM | POA: Diagnosis not present

## 2024-07-23 NOTE — Progress Notes (Signed)
 Chief Complaint  Patient presents with   Medicare Wellness     Subjective:   [name] is a 78 y.o. female who presents for a Medicare Annual Wellness Visit.  Visit info / Clinical Intake: Medicare Wellness Visit Type:: Subsequent Annual Wellness Visit Persons participating in visit and providing information:: patient Medicare Wellness Visit Mode:: Telephone If telephone:: video declined Since this visit was completed virtually, some vitals may be partially provided or unavailable. Missing vitals are due to the limitations of the virtual format.: Documented vitals are patient reported If Telephone or Video please confirm:: I connected with patient using audio/video enable telemedicine. I verified patient identity with two identifiers, discussed telehealth limitations, and patient agreed to proceed. Patient Location:: Home Provider Location:: Office Interpreter Needed?: No Pre-visit prep was completed: yes AWV questionnaire completed by patient prior to visit?: yes Date:: 07/22/24 Living arrangements:: lives with spouse/significant other Patient's Overall Health Status Rating: good Typical amount of pain: some Does pain affect daily life?: (!) yes Are you currently prescribed opioids?: (!) yes  Dietary Habits and Nutritional Risks How many meals a day?: 3 Eats fruit and vegetables daily?: yes Most meals are obtained by: preparing own meals; eating out In the last 2 weeks, have you had any of the following?: none Diabetic:: no  Functional Status Activities of Daily Living (to include ambulation/medication): Independent Ambulation: Independent with device- listed below Home Assistive Devices/Equipment: Eyeglasses Medication Administration: Independent Home Management (perform basic housework or laundry): Needs assistance (comment) (Maid) Manage your own finances?: yes Primary transportation is: driving Concerns about vision?: no *vision screening is required for  WTM* Concerns about hearing?: no  Fall Screening Falls in the past year?: 1 Number of falls in past year: 1 (2) Was there an injury with Fall?: 1 (head injury but no concussion) Fall Risk Category Calculator: 3 Patient Fall Risk Level: High Fall Risk  Fall Risk Patient at Risk for Falls Due to: History of fall(s); Impaired balance/gait Fall risk Follow up: Falls prevention discussed; Falls evaluation completed  Home and Transportation Safety: All rugs have non-skid backing?: yes All stairs or steps have railings?: yes Grab bars in the bathtub or shower?: (!) no Have non-skid surface in bathtub or shower?: yes Good home lighting?: yes Regular seat belt use?: yes Hospital stays in the last year:: no  Cognitive Assessment Difficulty concentrating, remembering, or making decisions? : no Will 6CIT or Mini Cog be Completed: yes What year is it?: 0 points What month is it?: 0 points Give patient an address phrase to remember (5 components): [address] [place], [place] About what time is it?: 0 points  Advance Directives (For Healthcare) Does Patient Have a Medical Advance Directive?: Yes Does patient want to make changes to medical advance directive?: Yes (Inpatient - patient requests chaplain consult to change a medical advance directive) Type of Advance Directive: Healthcare Power of [place]; Living will Copy of Healthcare Power of Attorney in Chart?: No - copy requested Copy of Living Will in Chart?: No - copy requested Would patient like information on creating a medical advance directive?: No - Patient declined  Reviewed/Updated  Reviewed/Updated: Reviewed All (Medical, Surgical, Family, Medications, Allergies, Care Teams, Patient Goals)    Allergies (verified) Nickel   Current Medications (verified) Outpatient Encounter Medications as of 07/23/2024  Medication Sig   acetaminophen  (TYLENOL ) 650 MG CR tablet Take 650 mg by mouth 2 (two) times daily.    calcium  carbonate (TUMS - DOSED IN MG ELEMENTAL CALCIUM) 500 MG  chewable tablet Chew 1 tablet by mouth daily.   cholecalciferol (VITAMIN D ) 1000 UNITS tablet Take 2,000 Units by mouth daily.   co-enzyme Q-10 50 MG capsule Take 50 mg by mouth daily.   diltiazem  (CARDIZEM  CD) 180 MG 24 hr capsule TAKE 1 CAPSULE BY MOUTH TWICE A DAY   diltiazem  (CARDIZEM ) 30 MG tablet TAKE 1 TABLET EVERY 4 HOURS AS NEEDED FOR AFIB HEART RATE >100   DULoxetine  (CYMBALTA ) 30 MG capsule TAKE 1 CAPSULE BY MOUTH EVERY DAY   estradiol  (ESTRACE ) 0.1 MG/GM vaginal cream Place 0.5 g vaginally 2 (two) times a week. Place 0.5g nightly for two weeks then twice a week after   hydrochlorothiazide  (HYDRODIURIL ) 25 MG tablet TAKE 1 TABLET (25 MG TOTAL) BY MOUTH DAILY.   HYDROcodone -acetaminophen  (NORCO/VICODIN) 5-325 MG tablet Take 1 tablet by mouth every 6 (six) hours as needed for severe pain (pain score 7-10).   KLOR-CON  M10 10 MEQ tablet Take 1 tablet (10 mEq total) by mouth 3 (three) times daily.   lidocaine  (LIDODERM ) 5 % Place 1 patch onto the skin daily. Remove & Discard patch within 12 hours or as directed by MD   lisinopril  (ZESTRIL ) 40 MG tablet TAKE 1 TABLET BY MOUTH EVERY DAY   magnesium oxide (MAG-OX) 400 MG tablet Take 400 mg by mouth daily.   memantine  (NAMENDA ) 5 MG tablet Take 1 tablet (5 mg at night) for 2 weeks, then increase to 1 tablet (5 mg) twice a day   oxyCODONE -acetaminophen  (PERCOCET/ROXICET) 5-325 MG tablet Take 1 tablet by mouth every 6 (six) hours as needed for severe pain (pain score 7-10).   pravastatin  (PRAVACHOL ) 40 MG tablet TAKE 1 TABLET BY MOUTH EVERY DAY   rivaroxaban  (XARELTO ) 20 MG TABS tablet Take 1 tablet (20 mg total) by mouth daily with supper.   semaglutide -weight management (WEGOVY ) 0.25 MG/0.5ML SOAJ SQ injection Inject 0.25 mg into the skin once a week.   No facility-administered encounter medications on file as of 07/23/2024.    History: Past Medical History:  Diagnosis Date    Arthritis    ARTHRITIS, HIPS, BILATERAL    Atrial fibrillation (HCC)    chronic anticoag - Pradaxa    BURSITIS, SUBTROCHANTERIC    HYPERTENSION    Mixed hyperlipidemia    Past Surgical History:  Procedure Laterality Date   DILATION AND CURETTAGE OF UTERUS     TONSILLECTOMY     Family History  Problem Relation Age of Onset   Hypertension Mother    Heart failure Mother    Leukemia Father    Social History   Occupational History   Occupation: Retired  Tobacco Use   Smoking status: Former    Current packs/day: 0.00    Average packs/day: 1 pack/day for 1 year (1.0 ttl pk-yrs)    Types: Cigarettes    Start date: 08/07/1966    Quit date: 08/08/1967    Years since quitting: 56.9   Smokeless tobacco: Never   Tobacco comments:    social age 74  Vaping Use   Vaping status: Never Used  Substance and Sexual Activity   Alcohol use: Yes    Alcohol/week: 1.0 standard drink of alcohol    Types: 1 Shots of liquor per week    Comment: occas   Drug use: No   Sexual activity: Yes   Tobacco Counseling Counseling given: Yes Tobacco comments: social age 53  SDOH Screenings   Food Insecurity: No Food Insecurity (07/23/2024)  Housing: Unknown (07/23/2024)  Transportation Needs: No Transportation  Needs (07/23/2024)  Utilities: Not At Risk (07/23/2024)  Alcohol Screen: Low Risk (07/23/2023)  Depression (PHQ2-9): Low Risk (07/23/2024)  Financial Resource Strain: Low Risk (05/19/2024)  Physical Activity: Insufficiently Active (07/23/2024)  Social Connections: Moderately Integrated (07/23/2024)  Stress: Stress Concern Present (07/23/2024)  Tobacco Use: Medium Risk (07/23/2024)  Health Literacy: Adequate Health Literacy (07/23/2024)   See flowsheets for full screening details  Depression Screen PHQ 2 & 9 Depression Scale- Over the past 2 weeks, how often have you been bothered by any of the following problems? Little interest or pleasure in doing things: 0 Feeling down, depressed, or  hopeless (PHQ Adolescent also includes...irritable): 0 PHQ-2 Total Score: 0 Trouble falling or staying asleep, or sleeping too much: 1 (gets about 6-7hrs of sleep) Feeling tired or having little energy: 1 Poor appetite or overeating (PHQ Adolescent also includes...weight loss): 0 Feeling bad about yourself - or that you are a failure or have let yourself or your family down: 0 Trouble concentrating on things, such as reading the newspaper or watching television (PHQ Adolescent also includes...like school work): 0 Moving or speaking so slowly that other people could have noticed. Or the opposite - being so fidgety or restless that you have been moving around a lot more than usual: 0 Thoughts that you would be better off dead, or of hurting yourself in some way: 0 PHQ-9 Total Score: 2 If you checked off any problems, how difficult have these problems made it for you to do your work, take care of things at home, or get along with other people?: Not difficult at all  Depression Treatment Depression Interventions/Treatment : EYV7-0 Score <4 Follow-up Not Indicated     Goals Addressed               This Visit's Progress     Patient Stated (pt-stated)        Patient stated she plans to lose weight - want to lose about 40lbs             Objective:    Today's Vitals   07/23/24 1052  Weight: 155 lb (70.3 kg)  Height: 5' 2.5 (1.588 m)   Body mass index is 27.9 kg/m.  Hearing/Vision screen Hearing Screening - Comments:: Denies hearing difficulties   Vision Screening - Comments:: Wears rx glasses - up to date with routine eye exams with [facility] Immunizations and Health Maintenance Health Maintenance  Topic Date Due   COVID-19 Vaccine (6 - 2025-26 season) 04/07/2024   Medicare Annual Wellness (AWV)  07/23/2025   Bone Density Scan  08/21/2025   DTaP/Tdap/Td (4 - Td or Tdap) 10/10/2033   Pneumococcal Vaccine: 50+ Years  Completed   Influenza Vaccine  Completed    Hepatitis C Screening  Completed   Zoster Vaccines- Shingrix  Completed   Meningococcal B Vaccine  Aged Out   Mammogram  Discontinued   Colonoscopy  Discontinued        Assessment/Plan:  This is a routine wellness examination for [place].  Patient Care Team: Geofm Glade PARAS, MD as PCP - General (Internal Medicine) Cindie Ole DASEN, MD as PCP - Electrophysiology (Cardiology) Gaspar Kung, MD (Orthopedic Surgery) Rosalynn LELON Ingle, MD (Inactive) (Obstetrics and Gynecology) Unice Pac, MD (Neurosurgery) Harvey Seltzer, MD (Sports Medicine) Luis Purchase, MD (Gastroenterology) Kathrine Jeoffrey POUR, NP (Inactive) as Nurse Practitioner (Cardiology) Jesus Oliphant, MD as Consulting Physician (Otolaryngology) Mae Wanda, MD as Consulting Physician (Ophthalmology)  I have personally reviewed and noted the following in the patients chart:  Medical and social history Use of alcohol, tobacco or illicit drugs  Current medications and supplements including opioid prescriptions. Functional ability and status Nutritional status Physical activity Advanced directives List of other physicians Hospitalizations, surgeries, and ER visits in previous 12 months Vitals Screenings to include cognitive, depression, and falls Referrals and appointments  No orders of the defined types were placed in this encounter.  In addition, I have reviewed and discussed with patient certain preventive protocols, quality metrics, and best practice recommendations. A written personalized care plan for preventive services as well as general preventive health recommendations were provided to patient.   Verdie CHRISTELLA Saba, CMA   07/23/2024   Return in 1 year (on 07/23/2025).  After Visit Summary: (MyChart) Due to this being a telephonic visit, the after visit summary with patients personalized plan was offered to patient via MyChart   Nurse Notes: Appointment(s) made: (scheduled 2026 AWV/CPE appts)

## 2024-07-23 NOTE — Patient Instructions (Addendum)
 [name],  Thank you for taking the time for your Medicare Wellness Visit. I appreciate your continued commitment to your health goals. Please review the care plan we discussed, and feel free to reach out if I can assist you further.  Please note that Annual Wellness Visits do not include a physical exam. Some assessments may be limited, especially if the visit was conducted virtually. If needed, we may recommend an in-person follow-up with your provider.  Ongoing Care Seeing your primary care provider every 3 to 6 months helps us  monitor your health and provide consistent, personalized care.   Referrals If a referral was made during today's visit and you haven't received any updates within two weeks, please contact the referred provider directly to check on the status.  Recommended Screenings:  Health Maintenance  Topic Date Due   COVID-19 Vaccine (6 - 2025-26 season) 04/07/2024   Medicare Annual Wellness Visit  07/23/2025   Osteoporosis screening with Bone Density Scan  08/21/2025   DTaP/Tdap/Td vaccine (4 - Td or Tdap) 10/10/2033   Pneumococcal Vaccine for age over 24  Completed   Flu Shot  Completed   Hepatitis C Screening  Completed   Zoster (Shingles) Vaccine  Completed   Meningitis B Vaccine  Aged Out   Breast Cancer Screening  Discontinued   Colon Cancer Screening  Discontinued       07/23/2024   10:53 AM  Advanced Directives  Does Patient Have a Medical Advance Directive? Yes  Type of Estate Agent of [place];Living will  Does patient want to make changes to medical advance directive? Yes (Inpatient - patient requests chaplain consult to change a medical advance directive)  Copy of Healthcare Power of Attorney in Chart? No - copy requested    Vision: Annual vision screenings are recommended for early detection of glaucoma, cataracts, and diabetic retinopathy. These exams can also reveal signs of chronic conditions such as diabetes and high  blood pressure.  Dental: Annual dental screenings help detect early signs of oral cancer, gum disease, and other conditions linked to overall health, including heart disease and diabetes.

## 2024-07-24 ENCOUNTER — Encounter: Payer: Self-pay | Admitting: Internal Medicine

## 2024-07-24 NOTE — Patient Instructions (Addendum)
° ° ° ° °  Blood work was ordered.       Medications changes include :   None    A referral was ordered and someone will call you to schedule an appointment.     Return in about 6 months (around 01/23/2025) for follow up.

## 2024-07-24 NOTE — Progress Notes (Unsigned)
 Subjective:    Patient ID: [name], female    DOB: September 01, 1945, 78 y.o.   MRN: [id number]     HPI [name] is here for follow up of her chronic medical problems.    Medications and allergies reviewed with patient and updated if appropriate.  Medications Ordered Prior to Encounter[1]   Review of Systems     Objective:  There were no vitals filed for this visit. BP Readings from Last 3 Encounters:  07/22/24 133/71  07/10/24 112/70  07/09/24 (!) 104/57   Wt Readings from Last 3 Encounters:  07/23/24 155 lb (70.3 kg)  07/10/24 158 lb (71.7 kg)  05/20/24 169 lb (76.7 kg)   There is no height or weight on file to calculate BMI.    Physical Exam     Lab Results  Component Value Date   WBC 12.9 (H) 02/09/2024   HGB 14.2 02/09/2024   HCT 42.4 02/09/2024   PLT 235 02/09/2024   GLUCOSE 107 (H) 02/09/2024   CHOL 187 01/23/2024   TRIG 108.0 01/23/2024   HDL 62.90 01/23/2024   LDLDIRECT 87.0 01/23/2022   LDLCALC 102 (H) 01/23/2024   ALT 11 02/09/2024   AST 18 02/09/2024   NA 140 02/09/2024   K 3.6 02/09/2024   CL 102 02/09/2024   CREATININE 0.97 02/09/2024   BUN 24 (H) 02/09/2024   CO2 25 02/09/2024   TSH 1.46 01/23/2024   INR 1.4 01/25/2010   HGBA1C 5.3 01/23/2024     Assessment & Plan:    See Problem List for Assessment and Plan of chronic medical problems.       [1]  Current Outpatient Medications on File Prior to Visit  Medication Sig Dispense Refill   acetaminophen  (TYLENOL ) 650 MG CR tablet Take 650 mg by mouth 2 (two) times daily.      calcium carbonate (TUMS - DOSED IN MG ELEMENTAL CALCIUM) 500 MG chewable tablet Chew 1 tablet by mouth daily.     cholecalciferol (VITAMIN D ) 1000 UNITS tablet Take 2,000 Units by mouth daily.     co-enzyme Q-10 50 MG capsule Take 50 mg by mouth daily.     diltiazem  (CARDIZEM  CD) 180 MG 24 hr capsule TAKE 1 CAPSULE BY MOUTH TWICE A DAY 180 capsule 3   diltiazem  (CARDIZEM ) 30 MG tablet TAKE 1  TABLET EVERY 4 HOURS AS NEEDED FOR AFIB HEART RATE >100 30 tablet 2   DULoxetine  (CYMBALTA ) 30 MG capsule TAKE 1 CAPSULE BY MOUTH EVERY DAY 90 capsule 1   estradiol  (ESTRACE ) 0.1 MG/GM vaginal cream Place 0.5 g vaginally 2 (two) times a week. Place 0.5g nightly for two weeks then twice a week after 42 g 11   hydrochlorothiazide  (HYDRODIURIL ) 25 MG tablet TAKE 1 TABLET (25 MG TOTAL) BY MOUTH DAILY. 90 tablet 1   HYDROcodone -acetaminophen  (NORCO/VICODIN) 5-325 MG tablet Take 1 tablet by mouth every 6 (six) hours as needed for severe pain (pain score 7-10). 20 tablet 0   KLOR-CON  M10 10 MEQ tablet Take 1 tablet (10 mEq total) by mouth 3 (three) times daily. 270 tablet 1   lidocaine  (LIDODERM ) 5 % Place 1 patch onto the skin daily. Remove & Discard patch within 12 hours or as directed by MD 30 patch 0   lisinopril  (ZESTRIL ) 40 MG tablet TAKE 1 TABLET BY MOUTH EVERY DAY 90 tablet 1   magnesium oxide (MAG-OX) 400 MG tablet Take 400 mg by mouth daily.     memantine  (NAMENDA )  5 MG tablet Take 1 tablet (5 mg at night) for 2 weeks, then increase to 1 tablet (5 mg) twice a day 180 tablet 1   oxyCODONE -acetaminophen  (PERCOCET/ROXICET) 5-325 MG tablet Take 1 tablet by mouth every 6 (six) hours as needed for severe pain (pain score 7-10). 15 tablet 0   pravastatin  (PRAVACHOL ) 40 MG tablet TAKE 1 TABLET BY MOUTH EVERY DAY 90 tablet 3   rivaroxaban  (XARELTO ) 20 MG TABS tablet Take 1 tablet (20 mg total) by mouth daily with supper. 90 tablet 1   semaglutide -weight management (WEGOVY ) 0.25 MG/0.5ML SOAJ SQ injection Inject 0.25 mg into the skin once a week. 2 mL 0   No current facility-administered medications on file prior to visit.

## 2024-07-25 ENCOUNTER — Ambulatory Visit: Admitting: Internal Medicine

## 2024-07-25 ENCOUNTER — Ambulatory Visit: Payer: Self-pay | Admitting: Internal Medicine

## 2024-07-25 VITALS — BP 118/78 | HR 63 | Temp 98.0°F | Ht 62.5 in | Wt 156.0 lb

## 2024-07-25 DIAGNOSIS — I10 Essential (primary) hypertension: Secondary | ICD-10-CM | POA: Diagnosis not present

## 2024-07-25 DIAGNOSIS — E7800 Pure hypercholesterolemia, unspecified: Secondary | ICD-10-CM

## 2024-07-25 DIAGNOSIS — E6609 Other obesity due to excess calories: Secondary | ICD-10-CM

## 2024-07-25 DIAGNOSIS — E538 Deficiency of other specified B group vitamins: Secondary | ICD-10-CM

## 2024-07-25 DIAGNOSIS — E559 Vitamin D deficiency, unspecified: Secondary | ICD-10-CM | POA: Diagnosis not present

## 2024-07-25 DIAGNOSIS — M150 Primary generalized (osteo)arthritis: Secondary | ICD-10-CM | POA: Diagnosis not present

## 2024-07-25 DIAGNOSIS — R7303 Prediabetes: Secondary | ICD-10-CM | POA: Diagnosis not present

## 2024-07-25 DIAGNOSIS — W19XXXA Unspecified fall, initial encounter: Secondary | ICD-10-CM | POA: Diagnosis not present

## 2024-07-25 DIAGNOSIS — E66811 Obesity, class 1: Secondary | ICD-10-CM

## 2024-07-25 DIAGNOSIS — Z6830 Body mass index (BMI) 30.0-30.9, adult: Secondary | ICD-10-CM | POA: Diagnosis not present

## 2024-07-25 DIAGNOSIS — I4821 Permanent atrial fibrillation: Secondary | ICD-10-CM | POA: Diagnosis not present

## 2024-07-25 LAB — CBC
HCT: 41.7 % (ref 36.0–46.0)
Hemoglobin: 13.9 g/dL (ref 12.0–15.0)
MCHC: 33.5 g/dL (ref 30.0–36.0)
MCV: 93.3 fl (ref 78.0–100.0)
Platelets: 265 K/uL (ref 150.0–400.0)
RBC: 4.47 Mil/uL (ref 3.87–5.11)
RDW: 13.4 % (ref 11.5–15.5)
WBC: 9.1 K/uL (ref 4.0–10.5)

## 2024-07-25 LAB — LIPID PANEL
Cholesterol: 144 mg/dL (ref 28–200)
HDL: 45.9 mg/dL
LDL Cholesterol: 78 mg/dL (ref 10–99)
NonHDL: 97.95
Total CHOL/HDL Ratio: 3
Triglycerides: 98 mg/dL (ref 10.0–149.0)
VLDL: 19.6 mg/dL (ref 0.0–40.0)

## 2024-07-25 LAB — COMPREHENSIVE METABOLIC PANEL WITH GFR
ALT: 13 U/L (ref 3–35)
AST: 15 U/L (ref 5–37)
Albumin: 4.3 g/dL (ref 3.5–5.2)
Alkaline Phosphatase: 61 U/L (ref 39–117)
BUN: 20 mg/dL (ref 6–23)
CO2: 32 meq/L (ref 19–32)
Calcium: 9.9 mg/dL (ref 8.4–10.5)
Chloride: 100 meq/L (ref 96–112)
Creatinine, Ser: 0.72 mg/dL (ref 0.40–1.20)
GFR: 80.66 mL/min
Glucose, Bld: 96 mg/dL (ref 70–99)
Potassium: 3.7 meq/L (ref 3.5–5.1)
Sodium: 139 meq/L (ref 135–145)
Total Bilirubin: 0.4 mg/dL (ref 0.2–1.2)
Total Protein: 6.9 g/dL (ref 6.0–8.3)

## 2024-07-25 LAB — HEMOGLOBIN A1C: Hgb A1c MFr Bld: 5.6 % (ref 4.6–6.5)

## 2024-07-25 NOTE — Assessment & Plan Note (Signed)
 Chronic Following with Dr Marquette for different joints Unable to take NSAIDs and Tylenol  not effective I have in the past prescribed hydrocodone -aceta  Q 6 hr prn for severe pain will be taking prednisone  prn per sports medicine when she is very active-especially when on vacation

## 2024-07-25 NOTE — Assessment & Plan Note (Signed)
Chronic Regular exercise and healthy diet encouraged Check lipid panel, CMP Continue pravastatin 40 mg daily

## 2024-07-25 NOTE — Assessment & Plan Note (Addendum)
 Chronic With comorbidities of hypertension, hypercholesterolemia, prediabetes Previously on Zepbound -Weight prior to zepbound  169 lb Having injection site reactions to zepbound  so discontinued zepbound  Started wegovy  0.25 mg weekly early December Tolerating medication well - will continue  Stressed good protein intake, regular exercise with weights ideally Will try to maintain on the low dose

## 2024-07-25 NOTE — Assessment & Plan Note (Signed)
 Recent fall a few days ago - tripped at home on a throw rug Evaluated in ED - still having chest pain - using spirometer and taking pain meds Lungs sound clear Discussed fall prevention - take up throw rugs Continue pain management Monitor for pna

## 2024-07-25 NOTE — Assessment & Plan Note (Signed)
 Chronic Vitamin D  level over the summer was good, continue supplementation

## 2024-07-25 NOTE — Assessment & Plan Note (Signed)
 Chronic Blood pressure well controlled Continue diltiazem  180 mg twice daily, HCTZ 25 mg daily, lisinopril  40 mg daily CMP

## 2024-07-25 NOTE — Assessment & Plan Note (Signed)
Chronic Continue B12 supplementation 

## 2024-07-25 NOTE — Assessment & Plan Note (Signed)
 Chronic Lab Results  Component Value Date   HGBA1C 5.3 01/23/2024   Discussed weight loss Low sugar / carb diet - smaller portions Stressed regular exercise

## 2024-07-25 NOTE — Assessment & Plan Note (Signed)
 Chronic Following with cardiology On Xarelto  20 mg daily, diltiazem  180 mg twice daily Has diltiazem  30 mg daily that she can take as needed every 4 hours CBC, CMP

## 2024-08-04 ENCOUNTER — Encounter: Payer: Self-pay | Admitting: Internal Medicine

## 2024-08-05 ENCOUNTER — Other Ambulatory Visit: Payer: Self-pay

## 2024-08-05 DIAGNOSIS — E6609 Other obesity due to excess calories: Secondary | ICD-10-CM

## 2024-08-05 MED ORDER — SEMAGLUTIDE-WEIGHT MANAGEMENT 0.5 MG/0.5ML ~~LOC~~ SOAJ: 0.5000 mg | SUBCUTANEOUS | 0 refills | Status: AC

## 2024-08-08 ENCOUNTER — Ambulatory Visit: Admitting: Obstetrics and Gynecology

## 2024-08-08 ENCOUNTER — Encounter: Payer: Self-pay | Admitting: Obstetrics and Gynecology

## 2024-08-08 VITALS — BP 103/60 | HR 62

## 2024-08-08 DIAGNOSIS — N393 Stress incontinence (female) (male): Secondary | ICD-10-CM | POA: Diagnosis not present

## 2024-08-08 DIAGNOSIS — N812 Incomplete uterovaginal prolapse: Secondary | ICD-10-CM

## 2024-08-08 DIAGNOSIS — N3281 Overactive bladder: Secondary | ICD-10-CM | POA: Diagnosis not present

## 2024-08-08 NOTE — Progress Notes (Signed)
 [facility] Return Visit  SUBJECTIVE  History of Present Illness: [name] is a 79 y.o. female seen in follow-up for stage II POP. She was planning to have urodynamics but it was rescheduled for a few weeks from now.   She currently has a #5 shaatz pessary. Feels the pessary is comfortable overall. Denies vaginal bleeding. She cannot remove the pessary herself due to arthritis but wishes to be sexually active.   She has OAB and has noticed some slight bladder leakage with the pessary in place, about a teaspoon worth, and she wears a small liner.   Past Medical History: Patient  has a past medical history of Arthritis, ARTHRITIS, HIPS, BILATERAL, Atrial fibrillation (HCC), BURSITIS, SUBTROCHANTERIC, HYPERTENSION, and Mixed hyperlipidemia.   Past Surgical History: She  has a past surgical history that includes Tonsillectomy and Dilation and curettage of uterus.   Medications: She has a current medication list which includes the following prescription(s): acetaminophen , calcium carbonate, cholecalciferol, co-enzyme q-10, diltiazem , diltiazem , duloxetine , estradiol , hydrochlorothiazide , hydrocodone -acetaminophen , klor-con  m10, lidocaine , lisinopril , magnesium oxide, memantine , oxycodone -acetaminophen , pravastatin , rivaroxaban , wegovy , and semaglutide -weight management.   Allergies: Patient is allergic to nickel.   Social History: Patient  reports that she quit smoking about 57 years ago. Her smoking use included cigarettes. She started smoking about 58 years ago. She has a 1 pack-year smoking history. She has never used smokeless tobacco. She reports current alcohol use of about 1.0 standard drink of alcohol per week. She reports that she does not use drugs.     OBJECTIVE     Physical Exam: Vitals:   08/08/24 0955  BP: 103/60  Pulse: 62   Gen: No apparent distress, A&O x 3.  Detailed Urogynecologic Evaluation:  Normal external genitalia. On speculum, mucosa  with atrophy, small abrasion at apex, non-bleeding, and normal appearing cervix. On bimanual, uterus is small, mobile and nontender.   POP-Q  0                                            Aa   0                                           Ba  -5.5                                              C   4                                            Gh  5.5                                            Pb  7.5  tvl   -2                                            Ap  -2                                            Bp  -7.5                                              D      ASSESSMENT AND PLAN    Ms. Osland is a 79 y.o. with:  1. Uterovaginal prolapse, incomplete   2. SUI (stress urinary incontinence, female)   3. Overactive bladder     - We reviewed options of expectant management, continues pessary use and surgery. She wants to keep the pessary out for now and reassess if she wants to use it again. It was cleaned and given to her to take home.  - She is considering surgery because she is not sure if she wants to worry about the prolapse as she ages. But she is concerned about surgical risks, especially with her A.fib and xarelto  use.  - We reviewed the patient's specific anatomic and functional findings, with the assistance of diagrams and handouts.   We reviewed the benefits and risks of each treatment option. We discussed risks of bleeding, infection, damage to surrounding organs including bowel, bladder, blood vessels, ureters and nerves, need for further surgery, numbness and weakness at any body site, buttock pain, postoperative cognitive dysfunction, and the rarer risks of blood clot, heart attack, pneumonia, death.  - If she wanted to proceed with surgery, would recommend a less invasive option of anterior repair, sacrospinous hysteropexy, perineorrhaphy. She may also need a procedure for incontinence depending on results of urodynamics.  - Will  follow up after urodynamics   Kristin LOISE Caper, MD  Time spent: I spent 35 minutes dedicated to the care of this patient on the date of this encounter to include pre-visit review of records, face-to-face time with the patient and post visit documentation.

## 2024-08-18 ENCOUNTER — Encounter: Payer: Self-pay | Admitting: *Deleted

## 2024-08-20 ENCOUNTER — Encounter: Admitting: Obstetrics and Gynecology

## 2024-08-29 ENCOUNTER — Ambulatory Visit: Admitting: Obstetrics and Gynecology

## 2024-09-04 ENCOUNTER — Telehealth: Payer: Self-pay | Admitting: Obstetrics and Gynecology

## 2024-09-04 NOTE — Telephone Encounter (Signed)
 LVM for patient to call back to schedule and OV for [facility].

## 2024-10-13 ENCOUNTER — Ambulatory Visit: Admitting: Obstetrics and Gynecology

## 2024-12-15 ENCOUNTER — Ambulatory Visit: Admitting: Internal Medicine

## 2025-01-14 ENCOUNTER — Ambulatory Visit: Admitting: Physician Assistant

## 2025-07-29 ENCOUNTER — Encounter: Admitting: Internal Medicine

## 2025-07-29 ENCOUNTER — Ambulatory Visit
